# Patient Record
Sex: Female | Born: 1935
Health system: Southern US, Community
[De-identification: ages and names within clinical notes are randomized; demographics above are authoritative.]

## PROBLEM LIST (undated history)

## (undated) DIAGNOSIS — Z9889 Other specified postprocedural states: Secondary | ICD-10-CM

## (undated) DIAGNOSIS — K219 Gastro-esophageal reflux disease without esophagitis: Secondary | ICD-10-CM

## (undated) DIAGNOSIS — M48061 Spinal stenosis, lumbar region without neurogenic claudication: Secondary | ICD-10-CM

## (undated) DIAGNOSIS — I723 Aneurysm of iliac artery: Secondary | ICD-10-CM

## (undated) DIAGNOSIS — I10 Essential (primary) hypertension: Secondary | ICD-10-CM

## (undated) DIAGNOSIS — M353 Polymyalgia rheumatica: Secondary | ICD-10-CM

## (undated) DIAGNOSIS — E785 Hyperlipidemia, unspecified: Secondary | ICD-10-CM

## (undated) HISTORY — DX: Aneurysm of iliac artery: I72.3

## (undated) HISTORY — DX: Essential (primary) hypertension: I10

## (undated) HISTORY — DX: Hyperlipidemia, unspecified: E78.5

## (undated) HISTORY — PX: SPINE SURGERY: SHX786

## (undated) HISTORY — DX: Polymyalgia rheumatica: M35.3

## (undated) HISTORY — DX: Spinal stenosis, lumbar region without neurogenic claudication: M48.061

## (undated) HISTORY — DX: Gastro-esophageal reflux disease without esophagitis: K21.9

---

## 1990-04-14 HISTORY — PX: JOINT REPLACEMENT: SHX530

## 1997-08-01 ENCOUNTER — Ambulatory Visit (HOSPITAL_COMMUNITY): Admission: RE | Admit: 1997-08-01 | Discharge: 1997-08-01 | Payer: Self-pay | Admitting: Internal Medicine

## 1997-08-21 ENCOUNTER — Inpatient Hospital Stay (HOSPITAL_COMMUNITY): Admission: RE | Admit: 1997-08-21 | Discharge: 1997-08-25 | Payer: Self-pay | Admitting: Orthopedic Surgery

## 1997-08-28 ENCOUNTER — Other Ambulatory Visit: Admission: RE | Admit: 1997-08-28 | Discharge: 1997-08-28 | Payer: Self-pay | Admitting: Orthopedic Surgery

## 1997-09-01 ENCOUNTER — Other Ambulatory Visit: Admission: RE | Admit: 1997-09-01 | Discharge: 1997-09-01 | Payer: Self-pay | Admitting: Orthopedic Surgery

## 1997-09-08 ENCOUNTER — Other Ambulatory Visit: Admission: RE | Admit: 1997-09-08 | Discharge: 1997-09-08 | Payer: Self-pay | Admitting: Orthopedic Surgery

## 1998-07-12 ENCOUNTER — Other Ambulatory Visit: Admission: RE | Admit: 1998-07-12 | Discharge: 1998-07-12 | Payer: Self-pay | Admitting: Internal Medicine

## 1999-01-30 ENCOUNTER — Encounter: Admission: RE | Admit: 1999-01-30 | Discharge: 1999-01-30 | Payer: Self-pay | Admitting: Orthopedic Surgery

## 1999-01-30 ENCOUNTER — Encounter: Payer: Self-pay | Admitting: Orthopedic Surgery

## 2000-04-14 HISTORY — PX: ROTATOR CUFF REPAIR: SHX139

## 2000-04-14 HISTORY — PX: JOINT REPLACEMENT: SHX530

## 2000-11-11 ENCOUNTER — Encounter: Payer: Self-pay | Admitting: Internal Medicine

## 2000-11-11 ENCOUNTER — Ambulatory Visit (HOSPITAL_COMMUNITY): Admission: RE | Admit: 2000-11-11 | Discharge: 2000-11-11 | Payer: Self-pay | Admitting: Internal Medicine

## 2000-11-25 ENCOUNTER — Other Ambulatory Visit: Admission: RE | Admit: 2000-11-25 | Discharge: 2000-11-25 | Payer: Self-pay | Admitting: Internal Medicine

## 2001-11-18 ENCOUNTER — Ambulatory Visit (HOSPITAL_COMMUNITY): Admission: RE | Admit: 2001-11-18 | Discharge: 2001-11-18 | Payer: Self-pay | Admitting: Internal Medicine

## 2001-11-18 ENCOUNTER — Encounter: Payer: Self-pay | Admitting: Internal Medicine

## 2002-12-15 ENCOUNTER — Encounter (INDEPENDENT_AMBULATORY_CARE_PROVIDER_SITE_OTHER): Payer: Self-pay | Admitting: Gastroenterology

## 2002-12-15 DIAGNOSIS — K5730 Diverticulosis of large intestine without perforation or abscess without bleeding: Secondary | ICD-10-CM

## 2003-02-09 ENCOUNTER — Ambulatory Visit (HOSPITAL_COMMUNITY): Admission: RE | Admit: 2003-02-09 | Discharge: 2003-02-09 | Payer: Self-pay | Admitting: Internal Medicine

## 2003-09-09 ENCOUNTER — Inpatient Hospital Stay (HOSPITAL_COMMUNITY): Admission: EM | Admit: 2003-09-09 | Discharge: 2003-09-10 | Payer: Self-pay | Admitting: Emergency Medicine

## 2004-02-20 ENCOUNTER — Ambulatory Visit (HOSPITAL_COMMUNITY): Admission: RE | Admit: 2004-02-20 | Discharge: 2004-02-20 | Payer: Self-pay | Admitting: Internal Medicine

## 2004-11-18 ENCOUNTER — Ambulatory Visit: Payer: Self-pay | Admitting: Internal Medicine

## 2004-11-20 ENCOUNTER — Ambulatory Visit: Payer: Self-pay | Admitting: Internal Medicine

## 2004-12-27 ENCOUNTER — Ambulatory Visit: Payer: Self-pay | Admitting: Family Medicine

## 2005-09-10 ENCOUNTER — Ambulatory Visit: Payer: Self-pay | Admitting: Internal Medicine

## 2005-12-01 ENCOUNTER — Ambulatory Visit: Payer: Self-pay | Admitting: Internal Medicine

## 2005-12-03 ENCOUNTER — Ambulatory Visit: Payer: Self-pay | Admitting: Internal Medicine

## 2005-12-03 ENCOUNTER — Other Ambulatory Visit: Admission: RE | Admit: 2005-12-03 | Discharge: 2005-12-03 | Payer: Self-pay | Admitting: Internal Medicine

## 2005-12-03 ENCOUNTER — Encounter: Payer: Self-pay | Admitting: Internal Medicine

## 2005-12-03 LAB — CONVERTED CEMR LAB

## 2005-12-09 ENCOUNTER — Ambulatory Visit (HOSPITAL_COMMUNITY): Admission: RE | Admit: 2005-12-09 | Discharge: 2005-12-09 | Payer: Self-pay | Admitting: Orthopedic Surgery

## 2006-01-23 ENCOUNTER — Ambulatory Visit: Payer: Self-pay | Admitting: Internal Medicine

## 2006-12-18 ENCOUNTER — Ambulatory Visit: Payer: Self-pay | Admitting: Internal Medicine

## 2006-12-18 LAB — CONVERTED CEMR LAB
BUN: 19 mg/dL (ref 6–23)
CO2: 29 meq/L (ref 19–32)
Calcium: 9.9 mg/dL (ref 8.4–10.5)
Chloride: 101 meq/L (ref 96–112)
Creatinine, Ser: 0.9 mg/dL (ref 0.4–1.2)
GFR calc Af Amer: 79 mL/min
GFR calc non Af Amer: 65 mL/min
Glucose, Bld: 124 mg/dL — ABNORMAL HIGH (ref 70–99)
Potassium: 3.3 meq/L — ABNORMAL LOW (ref 3.5–5.1)
Sodium: 138 meq/L (ref 135–145)

## 2007-01-13 ENCOUNTER — Ambulatory Visit: Payer: Self-pay | Admitting: Internal Medicine

## 2007-01-13 LAB — CONVERTED CEMR LAB
ALT: 37 units/L — ABNORMAL HIGH (ref 0–35)
AST: 28 units/L (ref 0–37)
BUN: 24 mg/dL — ABNORMAL HIGH (ref 6–23)
CO2: 29 meq/L (ref 19–32)
Calcium: 10.2 mg/dL (ref 8.4–10.5)
Chloride: 107 meq/L (ref 96–112)
Cholesterol: 203 mg/dL (ref 0–200)
Creatinine, Ser: 0.9 mg/dL (ref 0.4–1.2)
Direct LDL: 130.8 mg/dL
GFR calc Af Amer: 79 mL/min
GFR calc non Af Amer: 65 mL/min
Glucose, Bld: 108 mg/dL — ABNORMAL HIGH (ref 70–99)
HDL: 60.2 mg/dL (ref >39.0)
Potassium: 3.9 meq/L (ref 3.5–5.1)
Sodium: 143 meq/L (ref 135–145)
Total CHOL/HDL Ratio: 3.4
Triglycerides: 139 mg/dL (ref 0–149)
VLDL: 28 mg/dL (ref 0–40)

## 2007-01-14 ENCOUNTER — Encounter: Payer: Self-pay | Admitting: Internal Medicine

## 2007-01-14 ENCOUNTER — Ambulatory Visit: Payer: Self-pay | Admitting: Internal Medicine

## 2007-01-14 DIAGNOSIS — E785 Hyperlipidemia, unspecified: Secondary | ICD-10-CM | POA: Insufficient documentation

## 2007-01-14 DIAGNOSIS — I10 Essential (primary) hypertension: Secondary | ICD-10-CM | POA: Insufficient documentation

## 2007-01-14 DIAGNOSIS — R1310 Dysphagia, unspecified: Secondary | ICD-10-CM | POA: Insufficient documentation

## 2007-01-20 ENCOUNTER — Ambulatory Visit: Payer: Self-pay | Admitting: Internal Medicine

## 2007-01-21 ENCOUNTER — Ambulatory Visit: Payer: Self-pay | Admitting: Internal Medicine

## 2007-01-26 ENCOUNTER — Ambulatory Visit (HOSPITAL_COMMUNITY): Admission: RE | Admit: 2007-01-26 | Discharge: 2007-01-26 | Payer: Self-pay | Admitting: Internal Medicine

## 2007-02-04 ENCOUNTER — Ambulatory Visit: Payer: Self-pay | Admitting: Internal Medicine

## 2007-09-19 ENCOUNTER — Ambulatory Visit (HOSPITAL_COMMUNITY): Admission: EM | Admit: 2007-09-19 | Discharge: 2007-09-19 | Payer: Self-pay | Admitting: Emergency Medicine

## 2007-10-11 ENCOUNTER — Encounter: Payer: Self-pay | Admitting: Internal Medicine

## 2007-12-21 ENCOUNTER — Ambulatory Visit: Payer: Self-pay | Admitting: Internal Medicine

## 2007-12-21 LAB — CONVERTED CEMR LAB
ALT: 24 units/L (ref 0–35)
AST: 23 units/L (ref 0–37)
Albumin: 4 g/dL (ref 3.5–5.2)
Alkaline Phosphatase: 44 units/L (ref 39–117)
BUN: 23 mg/dL (ref 6–23)
Basophils Absolute: 0 10*3/uL (ref 0.0–0.1)
Basophils Relative: 0.6 % (ref 0.0–3.0)
Bilirubin, Direct: 0.1 mg/dL (ref 0.0–0.3)
CO2: 29 meq/L (ref 19–32)
Calcium: 10.5 mg/dL (ref 8.4–10.5)
Chloride: 105 meq/L (ref 96–112)
Cholesterol: 193 mg/dL (ref 0–200)
Creatinine, Ser: 1.1 mg/dL (ref 0.4–1.2)
Eosinophils Absolute: 0.2 10*3/uL (ref 0.0–0.7)
Eosinophils Relative: 2.7 % (ref 0.0–5.0)
GFR calc Af Amer: 63 mL/min
GFR calc non Af Amer: 52 mL/min
Glucose, Bld: 104 mg/dL — ABNORMAL HIGH (ref 70–99)
HCT: 45.1 % (ref 36.0–46.0)
HDL: 43.2 mg/dL (ref >39.0)
Hemoglobin: 15.3 g/dL — ABNORMAL HIGH (ref 12.0–15.0)
LDL Cholesterol: 129 mg/dL — ABNORMAL HIGH (ref 0–99)
Lymphocytes Relative: 30.7 % (ref 12.0–46.0)
MCHC: 33.9 g/dL (ref 30.0–36.0)
MCV: 95.5 fL (ref 78.0–100.0)
Monocytes Absolute: 0.8 10*3/uL (ref 0.1–1.0)
Monocytes Relative: 14.3 % — ABNORMAL HIGH (ref 3.0–12.0)
Neutro Abs: 3 10*3/uL (ref 1.4–7.7)
Neutrophils Relative %: 51.7 % (ref 43.0–77.0)
Platelets: 180 10*3/uL (ref 150–400)
Potassium: 4.3 meq/L (ref 3.5–5.1)
RBC: 4.72 M/uL (ref 3.87–5.11)
RDW: 12.9 % (ref 11.5–14.6)
Sodium: 144 meq/L (ref 135–145)
TSH: 0.75 microintl units/mL (ref 0.35–5.50)
Total Bilirubin: 1 mg/dL (ref 0.3–1.2)
Total CHOL/HDL Ratio: 4.5
Total Protein: 7.1 g/dL (ref 6.0–8.3)
Triglycerides: 103 mg/dL (ref 0–149)
VLDL: 21 mg/dL (ref 0–40)
WBC: 5.7 10*3/uL (ref 4.5–10.5)

## 2007-12-22 ENCOUNTER — Telehealth: Payer: Self-pay | Admitting: Internal Medicine

## 2007-12-27 ENCOUNTER — Ambulatory Visit (HOSPITAL_COMMUNITY): Admission: RE | Admit: 2007-12-27 | Discharge: 2007-12-27 | Payer: Self-pay | Admitting: Internal Medicine

## 2007-12-27 ENCOUNTER — Encounter: Payer: Self-pay | Admitting: Internal Medicine

## 2008-01-12 ENCOUNTER — Telehealth: Payer: Self-pay | Admitting: Internal Medicine

## 2008-01-14 ENCOUNTER — Ambulatory Visit: Payer: Self-pay | Admitting: Internal Medicine

## 2008-01-25 ENCOUNTER — Ambulatory Visit: Payer: Self-pay | Admitting: Internal Medicine

## 2008-01-28 ENCOUNTER — Ambulatory Visit: Payer: Self-pay | Admitting: Internal Medicine

## 2008-01-28 ENCOUNTER — Ambulatory Visit (HOSPITAL_COMMUNITY): Admission: RE | Admit: 2008-01-28 | Discharge: 2008-01-28 | Payer: Self-pay | Admitting: Internal Medicine

## 2008-08-15 ENCOUNTER — Encounter: Payer: Self-pay | Admitting: Internal Medicine

## 2008-09-05 ENCOUNTER — Encounter: Admission: RE | Admit: 2008-09-05 | Discharge: 2008-09-05 | Payer: Self-pay | Admitting: Neurological Surgery

## 2008-09-13 ENCOUNTER — Encounter: Payer: Self-pay | Admitting: Internal Medicine

## 2008-09-25 ENCOUNTER — Inpatient Hospital Stay (HOSPITAL_COMMUNITY): Admission: RE | Admit: 2008-09-25 | Discharge: 2008-09-29 | Payer: Self-pay | Admitting: Neurological Surgery

## 2009-01-01 ENCOUNTER — Ambulatory Visit: Payer: Self-pay | Admitting: Internal Medicine

## 2009-01-02 DIAGNOSIS — M51379 Other intervertebral disc degeneration, lumbosacral region without mention of lumbar back pain or lower extremity pain: Secondary | ICD-10-CM | POA: Insufficient documentation

## 2009-01-02 DIAGNOSIS — E669 Obesity, unspecified: Secondary | ICD-10-CM | POA: Insufficient documentation

## 2009-01-02 DIAGNOSIS — M5137 Other intervertebral disc degeneration, lumbosacral region: Secondary | ICD-10-CM

## 2009-01-09 ENCOUNTER — Ambulatory Visit: Payer: Self-pay | Admitting: Internal Medicine

## 2009-01-19 ENCOUNTER — Encounter: Payer: Self-pay | Admitting: Internal Medicine

## 2009-02-02 ENCOUNTER — Telehealth: Payer: Self-pay | Admitting: Internal Medicine

## 2009-02-06 ENCOUNTER — Telehealth: Payer: Self-pay | Admitting: Internal Medicine

## 2009-08-01 ENCOUNTER — Encounter: Payer: Self-pay | Admitting: Internal Medicine

## 2009-11-27 ENCOUNTER — Ambulatory Visit (HOSPITAL_COMMUNITY): Admission: EM | Admit: 2009-11-27 | Discharge: 2009-11-27 | Payer: Self-pay | Admitting: Emergency Medicine

## 2009-12-19 ENCOUNTER — Ambulatory Visit: Payer: Self-pay | Admitting: Internal Medicine

## 2010-01-08 ENCOUNTER — Encounter: Payer: Self-pay | Admitting: Internal Medicine

## 2010-01-10 ENCOUNTER — Encounter: Payer: Self-pay | Admitting: Internal Medicine

## 2010-01-10 ENCOUNTER — Ambulatory Visit: Payer: Self-pay | Admitting: Internal Medicine

## 2010-01-10 LAB — CONVERTED CEMR LAB
AST: 27 units/L (ref 0–37)
Albumin: 4.2 g/dL (ref 3.5–5.2)
BUN: 18 mg/dL (ref 6–23)
Basophils Absolute: 0 10*3/uL (ref 0.0–0.1)
CO2: 31 meq/L (ref 19–32)
Chloride: 102 meq/L (ref 96–112)
Cholesterol: 208 mg/dL — ABNORMAL HIGH (ref 0–200)
Creatinine, Ser: 1 mg/dL (ref 0.4–1.2)
Direct LDL: 131.9 mg/dL
Eosinophils Absolute: 0.1 10*3/uL (ref 0.0–0.7)
GFR calc non Af Amer: 60.88 mL/min (ref >60)
Glucose, Bld: 98 mg/dL (ref 70–99)
Lymphocytes Relative: 23.8 % (ref 12.0–46.0)
MCHC: 34.3 g/dL (ref 30.0–36.0)
MCV: 94.2 fL (ref 78.0–100.0)
Monocytes Relative: 11.7 % (ref 3.0–12.0)
Neutro Abs: 3.8 10*3/uL (ref 1.4–7.7)
Neutrophils Relative %: 62 % (ref 43.0–77.0)
Platelets: 193 10*3/uL (ref 150.0–400.0)
RDW: 13.9 % (ref 11.5–14.6)
Sodium: 141 meq/L (ref 135–145)
Total Bilirubin: 0.9 mg/dL (ref 0.3–1.2)
Total CHOL/HDL Ratio: 3
Triglycerides: 133 mg/dL (ref 0.0–149.0)
VLDL: 26.6 mg/dL (ref 0.0–40.0)

## 2010-02-01 ENCOUNTER — Encounter: Payer: Self-pay | Admitting: Internal Medicine

## 2010-05-16 NOTE — Letter (Signed)
Summary: [facility]  [facility]   Imported By: Sherian Rein 02/19/2010 09:34:49  _____________________________________________________________________  External Attachment:    Type:   Image     Comment:   External Document

## 2010-05-16 NOTE — Assessment & Plan Note (Signed)
Summary: FLU SHOT PER DEE   Nurse Visit   Allergies: No Known Drug Allergies  Immunizations Administered:  Pneumonia Vaccine:    Vaccine Type: Pneumovax    Site: right deltoid    Mfr: Merck    Dose: 0.5 ml    Route: IM    Given by: Mervin Hack CMA (AAMA)    Exp. Date: 06/19/2011    Lot #: [id number]    VIS given: 11/10/95 version given December 19, 2009.  Influenza Vaccine # 1:    Vaccine Type: Fluvax 3+    Site: left deltoid    Mfr: GlaxoSmithKline    Dose: 0.5 ml    Route: IM    Given by: Mervin Hack CMA (AAMA)    Exp. Date: 10/12/2010    Lot #: [id number]    VIS given: 11/06/2009  Flu Vaccine Consent Questions:    Do you have a history of severe allergic reactions to this vaccine? no    Any prior history of allergic reactions to egg and/or gelatin? no    Do you have a sensitivity to the preservative Thimersol? no    Do you have a past history of Guillan-Barre Syndrome? no    Do you currently have an acute febrile illness? no    Have you ever had a severe reaction to latex? no    Vaccine information given and explained to patient? yes    Are you currently pregnant? no  Orders Added: 1)  Pneumococcal Vaccine [90732] 2)  Admin 1st Vaccine [90471] 3)  Flu Vaccine 89yrs + [14782] 4)  Admin of Any Addtl Vaccine [95621]

## 2010-05-16 NOTE — Letter (Signed)
Summary: [facility]  [facility]   Imported By: Sherian Rein 01/25/2010 12:25:50  _____________________________________________________________________  External Attachment:    Type:   Image     Comment:   External Document

## 2010-05-16 NOTE — Assessment & Plan Note (Signed)
Summary: yearly f/u / medicare / will come fasting / pt wants flu shot...   Vital Signs:  Patient profile:   77 year old female Height:      67 inches Weight:      211 pounds BMI:     33.17 O2 Sat:      97 % on Room air Temp:     98.2 degrees F oral Pulse rate:   55 / minute BP sitting:   136 / 82  (left arm) Cuff size:   regular  Vitals Entered By: Bill Salinas CMA (January 10, 2010 8:54 AM)  O2 Flow:  Room air CC: yearly medicare/ ab Comments Pt on celebrex unsure of dose will call to update  Vision Screening:      Vision Comments: Normal eye exam done June 2010 at [facility]   Primary Care Provider:  Norins  CC:  yearly medicare/ ab.  History of Present Illness: Patient presents for preventive care and medical follow-uip.  She had lumbar fusion June '10. Since then her back has been ok but she has low back pain and pain running down her leg. She has seen Dr. Danielle Dess who recommend possible ESI low back. For now she is on a trial of celebrex. Her back pain does limit her walking. She has had PT after surgery and in spring '11.   she has had 2 falls during the past year. !st - no injury. 2nd - she dislocated left hip and fell. Had hip (THR) reduced without complications.  She did hit her shoulder and had pain.   She did have a good trip to [place] for HS reunion. She sees her grandchildren and great-grandchildren. She has no signs or symptoms of depression.   Despite her back pain she is independent in all ADLs. She runs the household, pays the bills, keeps up with all the birthdays.   Preventive Screening-Counseling & Management  Alcohol-Tobacco     Alcohol drinks/day: 0     Smoking Status: never  Caffeine-Diet-Exercise     Caffeine use/day: 1 cup daily     Does Patient Exercise: yes     Type of exercise: cardio     Exercise (avg: min/session): <30     Times/week: 6     MSH Depression Score: no depression  Hep-HIV-STD-Contraception     HIV Risk: no  risk noted     STD Risk: no risk noted     Dental Visit-last 6 months yes     Sun Exposure-Excessive: no  Safety-Violence-Falls     Seat Belt Use: yes     Helmet Use: n/a     Firearms in the Home: no firearms in the home     Smoke Detectors: yes     Violence in the Home: no risk noted     Sexual Abuse: no     Fall Risk: Pt has fallen 2 times in the past year      Sexual History:  currently monogamous.        Drug Use:  never.        Blood Transfusions:  yes and after 2001.    Current Medications (verified): 1)  Lipitor 40 Mg  Tabs (Atorvastatin Calcium) .... Q Pm 2)  Nadolol 40 Mg  Tabs (Nadolol) .... Once Daily 3)  Triamterene-Hctz 75-50 Mg  Tabs (Triamterene-Hctz) .... Once Daily 4)  Fish Oil 1000 Mg Caps (Omega-3 Fatty Acids) .Marland Kitchen.. 1 Cap Daily 5)  Caltrate 600+d 600-400 Mg-Unit Tabs (Calcium  Carbonate-Vitamin D) .Marland Kitchen.. 1 Daily  Allergies (verified): No Known Drug Allergies  Past History:  Family History: Last updated: 2008/01/14 father- deceased @ 63: CAD/MI, HTN, Bone marrow cancer mother - deceased @ 14: CHF, CAD Neg- breast or colon cancer Sister died DM complications @ 67  Social History: Last updated: 01/14/08 HSG, [employer] college - biology/psych, 6hrs shy of MA married - '52 1 daughter - '53, 1 son - '57: 3 grandsons; 3 great-grandchildren retired: Runner, broadcasting/film/video marriage is good. SO- healthy  Past Medical History: UCD Hyperlipidemia Hypertension  Hx of COLONIC POLYPS (ICD-211.3) DIVERTICULOSIS, COLON (ICD-562.10) DYSPHAGIA, UNSPECIFIED (ZOX-096.04)   Physician Roster:              NS - Dr. Georga Hacking - Dr. Jerolyn Center  Past Surgical History: Total hip replacement: right '92; left '86 Lumbar fusion-'90s, redo surgery in June '10 with extension Rotator cuff repair-'02 blephoroplasty Tendon repair left wrist after laceration    G2P2  Social History: Caffeine use/day:  1 cup daily Dental Care w/in 6 mos.:  yes Sun  Exposure-Excessive:  no Seat Belt Use:  yes Fall Risk:  Pt has fallen 2 times in the past year HIV Risk:  no risk noted STD Risk:  no risk noted Sexual History:  currently monogamous Drug Use:  never Blood Transfusions:  yes, after 2001  Review of Systems       The patient complains of weight loss and difficulty walking.  The patient denies anorexia, fever, weight gain, decreased hearing, hoarseness, chest pain, syncope, dyspnea on exertion, peripheral edema, abdominal pain, severe indigestion/heartburn, muscle weakness, abnormal bleeding, enlarged lymph nodes, and breast masses.    Physical Exam  General:  Overweight white female in no distress Head:  normocephalic, atraumatic, and no abnormalities observed.   Eyes:  vision grossly intact, pupils equal, pupils round, corneas and lenses clear, no optic disk abnormalities, and no retinal abnormalitiies.   Ears:  R ear normal, L ear normal, and no external deformities.   Nose:  no external deformity and no external erythema.   Mouth:  Oral mucosa and oropharynx without lesions or exudates.  Teeth in good repair. Neck:  supple, full ROM, no thyromegaly, and no carotid bruits.   Chest Wall:  no deformities and no tenderness.   Breasts:  No mass, nodules, thickening, tenderness, bulging, retraction, inflamation, nipple discharge or skin changes noted.   Lungs:  Normal respiratory effort, chest expands symmetrically. Lungs are clear to auscultation, no crackles or wheezes. Heart:  Normal rate and regular rhythm. S1 and S2 normal without gallop, murmur, click, rub or other extra sounds. Abdomen:  obese, soft, non-tender, normal bowel sounds, no guarding, and no hepatomegaly.   Genitalia:  deferred Msk:  normal ROM, no joint tenderness, no joint swelling, no joint warmth, and no joint deformities.    Back: able to stand without assistance, able to get up to exam table. Uses cane to assist with gait.   Hips: right hip with normal internal and  external rotation. left hip with decreased ROM and some tenderness with rotation. Pulses:  2+ radial and DP pulse Extremities:  No clubbing, cyanosis, edema, or deformity noted with normal full range of motion of all joints.   Neurologic:  alert & oriented X3, cranial nerves II-XII intact, and strength normal in all extremities.   Skin:  turgor normal, color normal, no rashes, and no suspicious lesions.   Cervical Nodes:  no anterior  cervical adenopathy and no posterior cervical adenopathy.   Axillary Nodes:  no R axillary adenopathy and no L axillary adenopathy.   Psych:  Oriented X3, memory intact for recent and remote, normally interactive, and good eye contact.     Impression & Recommendations:  Problem # 1:  DEGENERATIVE DISC DISEASE, LUMBAR SPINE (ICD-722.52) S/P fusion x 2 and still with low back pain but no radicular symptoms. She is followed closely by Dr. Danielle Dess  Orders: TLB-CBC Platelet - w/Differential (85025-CBCD)  Problem # 2:  DIVERTICULOSIS, COLON (ICD-562.10) No GI complaints or problems   Problem # 3:  HYPERTENSION (ICD-401.9)  Her updated medication list for this problem includes:    Nadolol 40 Mg Tabs (Nadolol) ..... Once daily    Triamterene-hctz 75-50 Mg Tabs (Triamterene-hctz) ..... Once daily  Orders: TLB-Lipid Panel (80061-LIPID) TLB-Hepatic/Liver Function Pnl (80076-HEPATIC) TLB-BMP (Basic Metabolic Panel-BMET) (80048-METABOL)  BP today: 136/82 Prior BP: 122/80 (01/01/2009)   Good control. routnine lab ordered. Medication refilled  Problem # 4:  HYPERLIPIDEMIA (ICD-272.4) For lab today with recommendations to follow.  Her updated medication list for this problem includes:    Lipitor 40 Mg Tabs (Atorvastatin calcium) ..... Q pm  Orders: TLB-Lipid Panel (80061-LIPID) TLB-Hepatic/Liver Function Pnl (80076-HEPATIC)  Addendum: LDL just greater than goal of 130.   Plan - continue present medications.           increase adherence to a low fat diet  and a regular exercise regimen -  a low stress activity e.g. water exercise.  Problem # 5:  Preventive Health Care (ICD-V70.0)  History as noted - on going back pain and now some hip pain. Exam was normal except for ortho problems. Lab.  Last mammogram in '09 and she is planning to have mammogram prior to leaving for [place]. Current with colorectal cancer screening. Immunizations - current with pneumonia vaccine . Last tetnus in '99 and due. she will consider shingles vaccine.  Patient with no signs of depression, I-ADLs, current with opthalmology and no visual problems. Cognitively intact -see HPI. Patinet counselled to work on weight loss, to continue present meds.  In summary - a very nice woman who is medically stable but dealing with NS/ortho issues. She will return as needed or 1 year.   Orders: Medicare -1st Annual Wellness Visit [phone])  Complete Medication List: 1)  Lipitor 40 Mg Tabs (Atorvastatin calcium) .... Q pm 2)  Nadolol 40 Mg Tabs (Nadolol) .... Once daily 3)  Triamterene-hctz 75-50 Mg Tabs (Triamterene-hctz) .... Once daily 4)  Fish Oil 1000 Mg Caps (Omega-3 fatty acids) .Marland Kitchen.. 1 cap daily 5)  Caltrate 600+d 600-400 Mg-unit Tabs (Calcium carbonate-vitamin d) .Marland Kitchen.. 1 daily  Patient: Sydney [name] Note: All result statuses are Final unless otherwise noted.  Tests: (1) Lipid Panel (LIPID)   Cholesterol          [H]  208 mg/dL                   5-784     ATP III Classification            Desirable:  < 200 mg/dL                    Borderline High:  200 - 239 mg/dL               High:  > = 240 mg/dL   Triglycerides             133.0 mg/dL  0.0-149.0     Normal:  <150 mg/dL     Borderline High:  865 - 199 mg/dL   HDL                       78.46 mg/dL                 >96.29   VLDL Cholesterol          26.6 mg/dL                  5.2-84.1  CHO/HDL Ratio:  CHD Risk                             3                    Men          Women     1/2 Average Risk     3.4           3.3     Average Risk          5.0          4.4     2X Average Risk          9.6          7.1     3X Average Risk          15.0          11.0                           Tests: (2) Hepatic/Liver Function Panel (HEPATIC)   Total Bilirubin           0.9 mg/dL                   3.2-4.4   Direct Bilirubin          0.1 mg/dL                   0.1-0.2   Alkaline Phosphatase      57 U/L                      39-117   AST                       27 U/L                      0-37   ALT                       28 U/L                      0-35   Total Protein             7.3 g/dL                    7.2-5.3   Albumin                   4.2 g/dL                    6.6-4.4  Tests: (3) BMP (METABOL)   Sodium  141 mEq/L                   135-145   Potassium                 4.3 mEq/L                   3.5-5.1   Chloride                  102 mEq/L                   96-112   Carbon Dioxide            31 mEq/L                    19-32   Glucose                   98 mg/dL                    16-10   BUN                       18 mg/dL                    9-60   Creatinine                1.0 mg/dL                   4.5-4.0   Calcium              [H]  11.1 mg/dL                  9.8-11.9   GFR                       60.88 mL/min                >60  Tests: (4) CBC Platelet w/Diff (CBCD)   White Cell Count          6.2 K/uL                    4.5-10.5   Red Cell Count            4.65 Mil/uL                 3.87-5.11   Hemoglobin                15.0 g/dL                   14.7-82.9   Hematocrit                43.8 %                      36.0-46.0   MCV                       94.2 fl                     78.0-100.0   MCHC                      34.3 g/dL  30.0-36.0   RDW                       13.9 %                      11.5-14.6   Platelet Count            193.0 K/uL                  150.0-400.0   Neutrophil %              62.0 %                      43.0-77.0   Lymphocyte %               23.8 %                      12.0-46.0   Monocyte %                11.7 %                      3.0-12.0   Eosinophils%              1.8 %                       0.0-5.0   Basophils %               0.7 %                       0.0-3.0   Neutrophill Absolute      3.8 K/uL                    1.4-7.7   Lymphocyte Absolute       1.5 K/uL                    0.7-4.0   Monocyte Absolute         0.7 K/uL                    0.1-1.0  Eosinophils, Absolute                             0.1 K/uL                    0.0-0.7   Basophils Absolute        0.0 K/uL                    0.0-0.1  Tests: (5) Cholesterol LDL - Direct (DIRLDL)  Cholesterol LDL - Direct                             131.9 mg/dL     Optimal:  <161 mg/dL     Near or Above Optimal:  100-129 mg/dL     Borderline High:  096-045 mg/dL     High:  409-811 mg/dL     Very High:  >914 mg/dLPrescriptions: TRIAMTERENE-HCTZ 75-50 MG  TABS (TRIAMTERENE-HCTZ) once daily  #90 Tablet x 2   Entered and Authorized by:   Jacques Navy MD   Signed by:   Jacques Navy  MD on 01/10/2010   Method used:   Faxed to ...       [facility] (mail-order)             , [place]         Ph: [phone]       Fax: [phone]   RxID:   [id number] NADOLOL 40 MG  TABS (NADOLOL) once daily  #90 Tablet x 2   Entered and Authorized by:   Jacques Navy MD   Signed by:   Jacques Navy MD on 01/10/2010   Method used:   Faxed to ...       [facility] (mail-order)             , [place]         Ph: [phone]       Fax: [phone]   RxID:   [id number] LIPITOR 40 MG  TABS (ATORVASTATIN CALCIUM) q PM  #90 x 3   Entered and Authorized by:   Jacques Navy MD   Signed by:   Jacques Navy MD on 01/10/2010   Method used:   Faxed to ...       [facility] (mail-order)             , [place]         Ph: [phone]       Fax: [phone]   RxID:   [id number]

## 2010-05-16 NOTE — Letter (Signed)
Summary: [facility]  [facility]   Imported By: Sherian Rein 08/09/2009 09:13:52  _____________________________________________________________________  External Attachment:    Type:   Image     Comment:   External Document

## 2010-07-17 ENCOUNTER — Telehealth: Payer: Self-pay | Admitting: *Deleted

## 2010-07-17 NOTE — Telephone Encounter (Signed)
Alternative medication - furosemide 40mg  once a day # 30, refill 11

## 2010-07-17 NOTE — Telephone Encounter (Signed)
[facility] called - pt's triam/hctz 75/50 is temp not avail. Please provide alternative.

## 2010-07-18 MED ORDER — FUROSEMIDE 40 MG PO TABS: 40.0000 mg | ORAL_TABLET | Freq: Every day | ORAL | Status: DC

## 2010-07-18 NOTE — Telephone Encounter (Signed)
Called new rx into [facility], pt aware of change.

## 2010-07-18 NOTE — Telephone Encounter (Signed)
Attempted to call - no answer, will send in new rx electronically

## 2010-07-22 LAB — CBC
HCT: 34.7 % — ABNORMAL LOW (ref 36.0–46.0)
HCT: 45 % (ref 36.0–46.0)
Hemoglobin: 11.7 g/dL — ABNORMAL LOW (ref 12.0–15.0)
MCHC: 33.7 g/dL (ref 30.0–36.0)
MCV: 93.5 fL (ref 78.0–100.0)
Platelets: 185 10*3/uL (ref 150–400)
RDW: 14.1 % (ref 11.5–15.5)
WBC: 5.4 10*3/uL (ref 4.0–10.5)

## 2010-07-22 LAB — BASIC METABOLIC PANEL
CO2: 28 mEq/L (ref 19–32)
CO2: 28 mEq/L (ref 19–32)
Chloride: 109 mEq/L (ref 96–112)
GFR calc Af Amer: >60 mL/min (ref >60)
Glucose, Bld: 110 mg/dL — ABNORMAL HIGH (ref 70–99)
Glucose, Bld: 138 mg/dL — ABNORMAL HIGH (ref 70–99)
Potassium: 3.9 mEq/L (ref 3.5–5.1)
Potassium: 4.6 mEq/L (ref 3.5–5.1)
Sodium: 138 mEq/L (ref 135–145)
Sodium: 145 mEq/L (ref 135–145)

## 2010-07-22 LAB — POCT I-STAT 4, (NA,K, GLUC, HGB,HCT): Glucose, Bld: 156 mg/dL — ABNORMAL HIGH (ref 70–99)

## 2010-07-22 LAB — ABO/RH: ABO/RH(D): A NEG

## 2010-07-22 LAB — TYPE AND SCREEN
ABO/RH(D): A NEG
Antibody Screen: NEGATIVE

## 2010-08-09 ENCOUNTER — Telehealth: Payer: Self-pay | Admitting: Internal Medicine

## 2010-08-09 NOTE — Telephone Encounter (Signed)
Pt had CPX Sep 2011.  Pt needs refill: Nadolol,furosemide. Pt will be out soon, but needs refills through next Sept. [facility]----[phone]

## 2010-08-13 MED ORDER — FUROSEMIDE 40 MG PO TABS: 40.0000 mg | ORAL_TABLET | Freq: Every day | ORAL | Status: DC

## 2010-08-13 MED ORDER — NADOLOL 40 MG PO TABS: 40.0000 mg | ORAL_TABLET | Freq: Every day | ORAL | Status: DC

## 2010-08-27 NOTE — Op Note (Signed)
NAME:  Sydney [name], Sydney [name] NO.:  [id number]   MEDICAL RECORD NO.:  [id number]          PATIENT TYPE:  EMS   LOCATION:  ED                           FACILITY:  [facility]   PHYSICIAN:  Ollen Gross, M.D.    DATE OF BIRTH:  1932/05/06   DATE OF PROCEDURE:  09/19/2007  DATE OF DISCHARGE:                               OPERATIVE REPORT   PREOPERATIVE DIAGNOSIS:  Dislocated left total hip arthroplasty.   POSTOPERATIVE DIAGNOSIS:  Dislocated left total hip arthroplasty.   PROCEDURE:  Closed reduction of left hip dislocation.   SURGEON:  Ollen Gross, M.D.  No assistant.   ANESTHESIA:  General.   COMPLICATIONS:  None.   CONDITION:  Stable to recovery.   BRIEF CLINICAL NOTE:  Ms. Hertenstein is a 77 year old female who had a total  hip arthroplasty done by Dr. Tresa Res approximately 20 years ago.  She  did well and had a dislocation some time of the past 2 years and then  today was bending down for something, reached over too far, sustained  another dislocation.  She presents now for closed reduction.   PROCEDURE IN DETAIL:  After successful administration of general  anesthetic, I applied traction to the leg in 90-90 position and felt  like she was popping in and out.  I then extended the leg, applied  traction, internally rotated, and the hip audibly reduced.  This was  consistent with reduction of an anterior dislocation.  The leg lengths  were then equalized.  I put her through a range of motion and could flex  90, rotate 30 in each direction and then abduct about 40, and it  remained reduced.  An AP of the hip showed that the hip is reduced.  There is some wear in the polyethylene so it is not fully concentric.  She is then placed into a knee immobilizer, awakened and transported to  recovery in stable condition.  Ollen Gross, M.D.  Electronically Signed     FA/MEDQ  D:  09/19/2007  T:  09/19/2007  Job:  [id number]

## 2010-08-27 NOTE — Discharge Summary (Signed)
NAME:  [name], [name] NO.:  [id number]   MEDICAL RECORD NO.:  [id number]          PATIENT TYPE:  INP   LOCATION:  3040                         FACILITY:  [facility]   PHYSICIAN:  Stefani Dama, M.D.  DATE OF BIRTH:  Oct 28, 1932   DATE OF ADMISSION:  09/25/2008  DATE OF DISCHARGE:  09/29/2008                               DISCHARGE SUMMARY   ADMITTING DIAGNOSIS:  Lumbar spondylosis and stenosis L1-2, L2-3, L3-4  status post arthrodesis L4 to sacrum.   DISCHARGE AND FINAL DIAGNOSES:  Spondylosis and stenosis L1-2, L2-3, and  L3-4 status post arthrodesis L4 to sacrum, lumbar radiculopathy.   CONDITION ON DISCHARGE:  Improving.   HOSPITAL COURSE:  Ms. Sydney [name] is a 77 year old individual who has  had severe spondylitic stenosis that has been worsening over a period of  several years.  She has evidence of stenotic disease with  lateral  recess stenosis at L1-2, more severe at L2-3 and the worse singular  level at L3-4 which is junctional to an L4 to sacrum arthrodesis.  She  was advised regarding surgical decompression with removal of her  previously placed hardware and extension of her arthrodesis to include  the L1 vertebra.  This was performed on September 25, 2008, with a  transforaminal interbody arthrodesis at the L1-L2 level and  posterolateral fusion from L1 to L4.  She tolerated her procedure well,  and gradually she was mobilized and she became independent in her level  of function within 48 hours.  Her pain became easily controllable with  oral pain medications after 72 hours.  She has not had a bowel movement  at the time of discharge, but she is voiding.  Incision is clean and  dry.  Surgical staples have been removed, and the patient is using  Percocet for pain control and Valium as a muscle spasm medication.  She  was given a prescription for 60 of Percocet and 40 of Valium 5 mg.  She  will be seen in the office in 2-3 weeks' time.  Condition on discharge  is improving.      Stefani Dama, M.D.  Electronically Signed     Stefani Dama, M.D.  Electronically Signed    HJE/MEDQ  D:  09/29/2008  T:  09/29/2008  Job:  [id number]

## 2010-08-27 NOTE — Assessment & Plan Note (Signed)
[facility]                         GASTROENTEROLOGY OFFICE NOTE   Sydney [name], Sydney [name]                        MRN:          [id number]  DATE:01/20/2007                            DOB:          Jan 05, 1933    REFERRING PHYSICIAN:  Rosalyn Gess. Norins, MD   REASON FOR CONSULTATION:  Dysphagia.   ASSESSMENT:  This is a 77 year old white woman with an 70-month history  of worsening and liquid food dysphagia as well as some spells of globus  or dysphagia, perhaps spasm while she is not eating.   PLAN:  1. Schedule upper GI endoscopy with possible dilation.  2. Consider biopsies for eosinophilic esophagitis, though this is      usually a disease of younger people.  3. Consider need for postprocedural proton pump inhibitor therapy.   This was all discussed with the patient including risks, benefits and  indications that include, but not limited to, bleeding, infection,  medication reaction, perforation and possible need for surgery.   HISTORY:  This is a 77 year old white woman with problems as above.  It  has come on and she has had several spells where it has been very  difficult to clear a food bolus and it seems to pass over time.  There  are times where she drinks water and sort of regurgitates it quite  quickly.  There are also times where she feels like phlegm builds up and  will not pass.  She has not lost weight.  There are some other water  brash symptoms, as noted.   PAST MEDICAL HISTORY:  1. Hyperplastic colon polyp, 2004, colonoscopy recall changed to 2014      after chart review.  2. Diverticulosis.  3. Dyslipidemia.  4. Obesity.  5. Hypertension.  6. Prior right hip replacement.  7. Prior left hip replacement.  8. Lumbar disk surgery.  9. Left shoulder rotator cuff repair.  10.Prior blepharoplasty.   ALLERGIES:  No known drug allergies.   MEDICATIONS:  Listed and reviewed in the chart.  She does use occasional  Pepcid AC.   SOCIAL HISTORY:  See my medical history form.   REVIEW OF SYSTEMS:  See medical history form.   PHYSICAL EXAMINATION:  A pleasant white woman.  Weight 227.6 pounds, BMI 36.1, height 5 feet 6-1/2 inches.  Blood  pressure 122/72, pulse 62.  EYES:  Anicteric.  ENT:  Normal mouth and oropharynx.  NECK:  Supple without thyroid mass.  CHEST:  Clear.  HEART:  S1, S2.  No murmurs or gallops.  ABDOMEN:  Obese, soft and nontender.  No organomegaly or mass.  LYMPHATICS:  No neck or supraclavicular nodes palpated.  EXTREMITIES:  Trace peripheral edema to no edema on the lower  extremities at the ankles.  SKIN:  Multiple nevi and sun damage.  No suspicious lesions in the areas  inspected.  PSYCHIATRIC:  She is alert and oriented x3.  NEUROLOGIC:  She is alert and grossly nonfocal.   I have reviewed the [place] chart including previous endoscopy reports,  previous pathology reports and Dr. Alvera Novel previous office notes.   NOTE:  Obesity is certainly a risk factor for reflux disease and we will  discuss some options for weight loss with her.  I certainly caloric  restriction would be in order.     [name], MD,FACG  Electronically Signed    CEG/MedQ  DD: 01/20/2007  DT: 01/20/2007  Job #: [id number]   cc:   Rosalyn Gess. Norins, MD

## 2010-08-27 NOTE — Op Note (Signed)
NAME:  Sydney [name], Sydney [name] NO.:  [id number]   MEDICAL RECORD NO.:  [id number]          PATIENT TYPE:  INP   LOCATION:  3310                         FACILITY:  [facility]   PHYSICIAN:  Stefani Dama, M.D.  DATE OF BIRTH:  06-19-1932   DATE OF PROCEDURE:  09/25/2008  DATE OF DISCHARGE:                               OPERATIVE REPORT   PREOPERATIVE DIAGNOSES:  1. Spondylosis and stenosis L1-L2, L2-L3 and L3-L4 status post      arthrodesis L4 to sacrum.  2. Lumbar radiculopathy.   POSTOPERATIVE DIAGNOSES:  1. Spondylosis and stenosis L1-L2, L2-L3 and L3-L4 status post      arthrodesis L4 to sacrum.  2. Lumbar radiculopathy.   OPERATION:  1. Removal of hardware L4 to sacrum.  2. Laminectomy of L1, L2, L3, and L4 with decompression of L1, L2, L3,      and L4 nerve roots bilaterally.  3. Transforaminal interbody arthrodesis L1-L2 on the left segmental      fixation, L1 through L4 with pedicle screws, posterolateral      arthrodesis from L1 thorough L4 with local autograft and allograft      including infuse.   SURGEON:  Stefani Dama, MD   FIRST ASSISTANT:  Hewitt Shorts, MD   ANESTHESIA:  General endotracheal.   INDICATIONS:  Sydney [name] is a 77 year old individual who has had  significant spondylitic stenosis at L1-2, L2-3, L3-4.  She has had a  previous arthrodesis from L4 to the sacrum.  She has been complaining of  back and bilateral leg pain and after careful consideration of her  options.  I advised surgical decompression arthrodesis from L1 through  L4 with removal of her previously placed hardware.   PROCEDURE:  The patient was brought to the operating room supine on the  stretcher after smooth induction of general endotracheal anesthesia.  The patient was turned prone.  The back was prepped with alcohol and  DuraPrep and draped in sterile fashion.  After opening the midline  incision, the area of the old hardware was explored, and here,  identified  the screw caps on top of 2 parallel rods.  They were each  loosened and loosening of the rods required dissection further out to  identify the pedicle entry sites for the screws from L4 to the sacrum.  The screw caps were denuded and ultimately removed, then were able to  remove the rod and screw caps.  The screws were then removed  individually with the old [employer] instrumentation that had been used.  Once the screws were removed from the left side, it was noted that the  sacral screw was actually fractured within the sacrum itself on the left  side.  The right-sided hardware was removed with some difficulty but  that hardware was noted to be intact.  The left-sided sacral screw was  then removed by drilling around the broken stud, and then using a stress  but extracting tool to remove the stud from within the bone of the  sacrum.  At this point, we, Dr. Newell Coral and I, decided  to complete the  completion laminectomy of L4, remove the entirety of the lamina of L3  and L2 and L1 had a radical laminectomy performing bilateral total  facetectomies to decompress the L1 nerve root superiorly and the L2  nerve root inferiorly.  As the laminectomy was proceeding, the right-  sided pedicle fracture releasing the transverse process on the left side  and the care was taken to protect the exiting L2 nerve root on the left  side, however because of the nature of the pedicle fracture with a small  very deformed pedicle, felt that no pedicle screw be placed into this  area.  Once the laminectomies were completed, we sequentially made sure  the decompression of each of the nerve root was ensued.  There was a  significant posterolateral bulging disk at L1 and L2 on that left side  that necessitated diskectomy and then the transforaminal decompression  was performed with the diskectomy being completely denuded, a TLIF bone  spacer was placed measuring 7 mm in height.  This was filled with  combination of  Vitoss and infuse.  The diskectomy performed via this  left side at the L1-L2 level was completed as best possible.  The bony  endplates were decorticated and then bone that was harvested from the  laminectomies was placed into the interspace and the TLIF spacer. Was  placed into the center of the vertebra.  Pedicle entry sites were then  chosen at L1 and at L1 6.5 x 45 mm screws were drilled and tapped.  These were placed with a significant angle towards each other so that  good purchase in the bone, throughout the vertebra was obtained.  Good  purchase through the pedicle was obtained in an angular fashion.  The  screw would hold quite well.  At L2, right-sided pedicle screw was  placed.  This was 6.5 x 45 mm each screw was individually tapped and  sounded, make sure there was no cutout in addition to being  radiographically verified.  At L3, bilateral 6.5 x 45 mm screws were  placed.  At L4, the right-sided screw hole was used for placement of the  right-sided screw but on the left side, we drilled a new hole so as to  place the screws in a more parallel orientation.  A 6.5 x 45 mm screw  was then drilled, tapped and sounded.  No cutout was identified.  With  this then, 75-mm rods were placed between the screw heads.  These rods  were precontoured.  Because of the lack of any fixation at the L2  vertebrae, we placed a transverse connector.  With this being secured at  the lateral gutters and it  had been previously decorticated by both,  Dr. Newell Coral and myself, were then packed with a combination of  allograft and autograft.  Once the bone was packed and tamped into  position, care was taken to make sure that each of the individual nerve  roots were sounded and found to be free and clear.  Once this was  completed, lumbodorsal fascia was then closed with #1 Vicryl in  interrupted fashion, 2-0 Vicryl using subcutaneous tissues and 3-0  Vicryl subcuticularly.  During the procedure  approximately 15-100 mL of  blood loss occurred 550 mL of the patient's own blood was recovered from  the Cell Saver.  The patient was returned to recovery room in stable  condition.      Stefani Dama, M.D.  Electronically Signed  HJE/MEDQ  D:  09/25/2008  T:  09/26/2008  Job:  [id number]

## 2010-08-30 NOTE — Op Note (Signed)
NAME:  Sydney [name], Sydney [name] NO.:  [id number]   MEDICAL RECORD NO.:  [id number]                   PATIENT TYPE:  INP   LOCATION:  5729                                 FACILITY:  [facility]   PHYSICIAN:  Thera Flake., M.D.             DATE OF BIRTH:  08-25-1932   DATE OF PROCEDURE:  09/09/2003  DATE OF DISCHARGE:                                 OPERATIVE REPORT   PREOPERATIVE DIAGNOSIS:  Prosthetic dislocation, left hip (posterior  superior).   POSTOPERATIVE DIAGNOSIS:  Prosthetic dislocation, left hip (posterior  superior).   PROCEDURE:  Closed reduction under anesthesia.   SURGEON:  Dyke Brackett, M.D.   ANESTHESIA:  General.   INDICATIONS:  The patient had had a spontaneous dislocation and relocation,  I believe untreated, four to five months ago, bending over at about 90  degrees, felt a pop, hip dislocated posterior superiorly, felt to be  amenable to closed reduction.   After general anesthesia, the hip was flexed about 95 degrees, internally  rotated and adducted.  She reduced easily.  It should be noted that at about  80 degrees with any internal rotation and adduction of the leg at all she  dislocated, meaning the significant issue really was flexion greater than  that amount.  She was confirmed to be in good position in the AP and lateral  plane on the fluoroscopy with range of motion.  Placed in a knee  immobilizer, taken to the recovery room in stable condition.                                               Thera Flake., M.D.    WDC/MEDQ  D:  09/09/2003  T:  09/10/2003  Job:  [id number]

## 2010-08-30 NOTE — Discharge Summary (Signed)
NAME:  Sydney [name], ALWIN NO.:  [id number]   MEDICAL RECORD NO.:  [id number]                   PATIENT TYPE:  INP   LOCATION:  5729                                 FACILITY:  [facility]   PHYSICIAN:  Dyke Brackett, M.D.                 DATE OF BIRTH:  [birth year]   DATE OF ADMISSION:  09/08/2003  DATE OF DISCHARGE:  09/10/2003                                 DISCHARGE SUMMARY   ADMISSION DIAGNOSES:  1. Left total hip dislocation.  2. Hypercholesterolemia.  3. Hypertension.   DISCHARGE DIAGNOSES:  1. Status post left prosthetic hip closed reduction and anesthesia.  2. Hypercholesterolemia.  3. Hypertension.   HISTORY OF PRESENT ILLNESS:  Ms. Sydney [name] is a 77 year old white female who  bent over to get some popcorn off the floor when she felt a hard pop in her  left hip.  The patient was unable to bear full weight on the left leg and  fell to the floor.  No loss of consciousness, no other injuries.  The  patient does have a history of a left total hip dislocation approximately 4  months ago with spontaneous reduction.   ALLERGIES:  No known drug allergies.   MEDICATIONS:  1. Lipitor 10 mg p.o. daily.  2. Premarin.  3. Medroxiprogesterone.  4. A blood pressure medicine.   SURGICAL PROCEDURE:  The patient was taken to the operating room on Sep 09, 2003 by Dr. Madelon Lips.  The patient was placed under general anesthesia and a  closed reduction of the left prosthetic hip was performed.  The position of  the hip was confirmed using fluoroscopy in the AP and lateral planes which  showed good position.  The patient was placed in a knee immobilizer and  taken to the recovery room in stable condition.   CONSULTATIONS:  The only consults were obtained while the patient was  hospitalized were CT and case management.   HOSPITAL COURSE:  On Sep 09, 2003, postoperatively, the patient was placed  in a left hip abductor brace by [employer].  Donning and removing the brace  was  explained to the patient and family by [employer].  On postoperative day 1 the  patient was comfortable. She underwent physical therapy and then was  discharged home in good stable condition.   LABORATORY DATA ON ADMISSION:  CBC was completely within normal limits.  Chemistries showed that potassium was 3.2, glucose 130, otherwise within  normal limits.   X-RAYS:  Left hip, 2 views performed on Sep 09, 2003 showing left hip  prosthesis dislocation.  AP pelvis performed on Sep 09, 2003 and showed left hip prosthesis  dislocation, no associated pelvic fractures.  A chest x-ray performed on Sep 09, 2003 showed cardiomegaly with a basilar nodule, no evidence of acute  disease otherwise.   Left hip single spot view performed in the operating room on Sep 09, 2003  showed location of the femoral head prosthesis.   EKG performed on Sep 09, 2003 showed a normal sinus rhythm, heart rate of 62  beats per minute, PR interval 170 milliseconds, PRT axis 47, 66 and 64.   DISCHARGE INSTRUCTIONS:  The patient was to resume home medications; add  Percocet 5 mg 1-2 tablets for pain  q.4-6h.   ACTIVITY:  As tolerated with a brace on.  May use long leg immobilizer at  night.   DIET:  No restrictions.   FOLLOW UP:  The patient needs to follow up with Dr. Madelon Lips in his office  the week of May 29th through June 3rd.  The patient is to call the office at  [phone] for an appointment.   CONDITION ON DISCHARGE:  The patient was discharged home in good stable  condition.      Richardean Canal, Arnetha Courser, M.D.    GC/MEDQ  D:  10/04/2003  T:  10/06/2003  Job:  [id number]

## 2010-08-30 NOTE — Assessment & Plan Note (Signed)
[facility]                             [facility] OFFICE NOTE   AKYLA, VAVREK                        MRN:          [id number]  DATE:12/03/2005                            DOB:          [birth year]    HISTORY OF PRESENT ILLNESS:  Sydney [name] is a very delightful 77 year old  woman who presents for routine follow up evaluation and examination.  She is  followed for hyperlipidemia and hypertension.  She was last seen in the  office for preoperative clearance on Sep 10, 2005.  She did have successful  surgery with bilateral external levator resection and bilateral lower lid  blepharoplasty.  She had good results with no complications.  The patient  reports she is feeling well and doing well and has no specific medical  complaints or problems.   PAST SURGICAL HISTORY:  1. Right hip replacement.  2. Left hip replacement.  3. Three disk lumbar surgery in the 1990's.  4. Left shoulder rotator cuff repair, open in 2002.  5. Blepharoplasty in May 2007.   PAST MEDICAL ILLNESSES:  1. Usual childhood diseases.  2. Scarlet fever.  3. Hypertension.  4. Hyperlipidemia.  5. Arthritic problems as above.   CURRENT MEDICATIONS:  1. Lipitor 40 mg daily.  2. Corgard 40 mg daily.  3. Triazene/hydrochlorothiazide 75/50 once daily.  4. Multivitamin and calcium.   FAMILY HISTORY:  Positive for heart disease in her father and her two  brothers, both of whom have now died, one after having bypass surgery.  Positive family history of diabetes in a sister who died from complications  of the same.  No family history for colon cancer or breast cancer.   SOCIAL HISTORY:  The patient has been married 55 years.  One son and one  daughter.  She has three grandsons.  She has two great grandsons.  The  patient is a retired Engineer, site.  She and her husband are doing well.  They do winter in [place].   REVIEW OF SYSTEMS:  The patient denies any constitutional  problems.  She is  up to date with Dr. Sheffield Slider for ophthalmology.  No ENT, Cardiovascular,  Respiratory, GI or GU complaints.   PHYSICAL EXAMINATION:  VITAL SIGNS:  Temperature 96.7, blood pressure  124/70, pulse 72, weight 217.4 pounds.  GENERAL APPEARANCE:  A heavy set Caucasian female who looks her stated age  in no acute distress.  GENERAL APPEARANCE:  Well-developed, well-nourished, Caucasian female who  looks his stated age in no acute distress.  HEENT:  Normocephalic, atraumatic.  EAC's and TM's are normal.  Oropharynx  with negative dentition, in good repair.  No buccal or palate lesions were  noted.  Posterior pharynx was clear.  Conjunctivae and sclerae were clear.  PERRLA.  EOMI.  Funduscopic examination with handheld instrument revealed  normal disk margins with no vascular abnormalities.  Periorbital structures  seemed to be intact with invisible lines of surgery.  She does have some  fluid collection in the infraorbital region.  NECK:  Supple without thyromegaly.  LYMPH NODES:  No adenopathy was  noted in the cervical or supraclavicular or  axillary regions.  CHEST:  No CVA tenderness.  LUNGS:  Clear to auscultation and percussion.  BREASTS:  Skin was clear.  Nipples without discharge.  There was no fixed  mass, lesion or abnormality noted to palpation.  CARDIOVASCULAR:  Radial pulses 2+.  No JVD or carotid bruits.  Had a quite  precordium with regular rate and rhythm without murmurs, rubs or gallops.  ABDOMEN:  Soft, no guarding or rebound.  No organosplenomegaly was noted.  PELVIC:  Examination NAT/BUS was normal.  Vaginal mucosa was atrophic and  she was dry making speculum introduction uncomfortable.  Cervix was patulous  with a cervical polyp at the 5 o'clock position.  Endocervical brushing and  Pap smear were performed without difficulty.  Bimanual exam deferred  secondary to patient's GERD.  RECTOVAGINAL:  Deferred secondary to patient having recent colonoscopy in   2004.  EXTREMITIES:  Without cyanosis, clubbing or edema or deformity.  NEUROLOGICAL:  Grossly nonfocal.   DATABASE ON ADMISSION:  Hemoglobin 15.2 g, white count 4200.  Chemistries  were unremarkable with serum glucose of 84.  Hemoglobin A1C was normal at  5.8%.  Renal function was normal with a creatinine of 1.1, liver functions  were normal.  Calcium mildly elevated at 10.9.  Cholesterol is 178,  triglycerides 105, HDL 54.7, LDL 102.   ASSESSMENT/PLAN:  1. Hypertension.  Patient with excellent control at this time.  She will      continue on her present medications.  Labs were normal.  2. Hyperlipidemia.  Patient with excellent control on her present medical      regimen.  She will continue with the same.  3. Hypercalcemia.  In reviewing the patient's chart, she did have an      elevated calcium of 10.9 in 2003.  Interval calcium was normal at 10.0,      now with recurrent 10.9.  The patient has had normal mammography, but      last study in the charts from 2004.  The patient is scheduled for      mammogram soon, and if this is a normal study, we will need to consider      getting an intact parathyroid hormone level.  4. Health Maintenance:  The patient is currently up to date with      colorectal cancer screening with colonoscopy in 2004 with follow up due      in 2014.  She will have a mammogram in the next week, report pending to      me.  She is otherwise current and up to date.   Mrs. Early is a very pleasant woman whose medical problems are stable.  She  does want to work on weight loss, and I have encouraged her to continue this  effort with calorie restricted diet, basically based on portion size and  smart choices and regular exercise.   The patient is asked to see me back in one year, and she will be  transferring her records to the [facility] office.                                   Rosalyn Gess Norins, MD   MEN/MedQ DD:  12/03/2005  DT:  12/04/2005  Job #:   [id number]   cc:   Feliz Beam

## 2010-08-30 NOTE — Op Note (Signed)
NAME:  JISELLA, ASHENFELTER NO.:  [id number]   MEDICAL RECORD NO.:  [id number]                   PATIENT TYPE:  INP   LOCATION:  5729                                 FACILITY:  [facility]   PHYSICIAN:  Thera Flake., M.D.             DATE OF BIRTH:  09/11/1932   DATE OF PROCEDURE:  DATE OF DISCHARGE:                                 OPERATIVE REPORT   Audio too short to transcribe (less than 5 seconds)                                               Thera Flake., M.D.    WDC/MEDQ  D:  09/09/2003  T:  09/09/2003  Job:  [id number]

## 2010-09-14 ENCOUNTER — Other Ambulatory Visit: Payer: Self-pay | Admitting: Internal Medicine

## 2010-11-06 ENCOUNTER — Other Ambulatory Visit (HOSPITAL_COMMUNITY): Payer: Self-pay | Admitting: Neurological Surgery

## 2010-11-06 DIAGNOSIS — M48061 Spinal stenosis, lumbar region without neurogenic claudication: Secondary | ICD-10-CM

## 2010-11-12 ENCOUNTER — Ambulatory Visit (HOSPITAL_COMMUNITY)
Admission: RE | Admit: 2010-11-12 | Discharge: 2010-11-12 | Disposition: A | Discharge status: Home / Self Care | Payer: Medicare Other | Source: Ambulatory Visit | Attending: Neurological Surgery | Admitting: Neurological Surgery

## 2010-11-12 ENCOUNTER — Encounter (HOSPITAL_COMMUNITY): Payer: Self-pay

## 2010-11-12 DIAGNOSIS — I723 Aneurysm of iliac artery: Secondary | ICD-10-CM | POA: Insufficient documentation

## 2010-11-12 DIAGNOSIS — M5126 Other intervertebral disc displacement, lumbar region: Secondary | ICD-10-CM | POA: Insufficient documentation

## 2010-11-12 DIAGNOSIS — M48061 Spinal stenosis, lumbar region without neurogenic claudication: Secondary | ICD-10-CM | POA: Insufficient documentation

## 2010-11-12 HISTORY — DX: Other specified postprocedural states: Z98.890

## 2010-11-12 MED ORDER — IOHEXOL 180 MG/ML  SOLN
14.0000 mL | Freq: Once | INTRAMUSCULAR | Status: AC | PRN
  Administered 2010-11-12: 14 mL via INTRATHECAL

## 2010-11-25 ENCOUNTER — Other Ambulatory Visit: Payer: Self-pay | Admitting: Internal Medicine

## 2010-12-02 ENCOUNTER — Encounter: Payer: Self-pay | Admitting: Vascular Surgery

## 2010-12-30 ENCOUNTER — Encounter: Payer: Self-pay | Admitting: Vascular Surgery

## 2010-12-31 ENCOUNTER — Ambulatory Visit (INDEPENDENT_AMBULATORY_CARE_PROVIDER_SITE_OTHER): Payer: Medicare Other | Admitting: Vascular Surgery

## 2010-12-31 ENCOUNTER — Encounter: Payer: Self-pay | Admitting: Vascular Surgery

## 2010-12-31 VITALS — BP 138/90 | HR 62 | Resp 22 | Ht 67.0 in | Wt 207.0 lb

## 2010-12-31 DIAGNOSIS — I723 Aneurysm of iliac artery: Secondary | ICD-10-CM

## 2010-12-31 NOTE — Consult Note (Signed)
NEW PATIENT CONSULTATION  Sydney, [name] DOB:  1932-09-07                                       12/31/2010 XBJYN#:[id number]  The patient presents today for evaluation of incidental finding of a right common iliac artery aneurysm which is 2 cm in size.  She has an extensive history of premature degenerative disk disease and underwent CT scan for further evaluation.  This did reveal an incidental finding of a 2 cm common iliac artery aneurysm.  There was no evidence of leak. She has no known history of aneurysm in the past.  She does have history of hypertension and elevated cholesterol.  No cardiac history.  Past surgical history is for rotator cuff repair, multiple spine surgeries and also 2 hip surgeries.  SOCIAL HISTORY:  She is a retired Runner, broadcasting/film/video.  She is married with 2 children.  She is to celebrate her 60th wedding anniversary tomorrow with her husband.  She does not smoke or drink alcohol.  FAMILY HISTORY:  Significant for premature heart disease and her dad dying at age 65.  REVIEW OF SYSTEMS:  No weight loss or gain.  Generally negative.  She does have degenerative disk disease in her back with arthritis, muscle pain and joint pain.  Review of systems otherwise negative.  PHYSICAL EXAM:  General:  A well-developed, well-nourished white female in no acute distress.  HEENT:  Normal.  Her carotid arteries without bruits bilaterally.  She has 2+ radial, 2+ femoral, 2+ popliteal and 2+ dorsalis pedis pulses bilaterally without any evidence of peripheral aneurysms.  Heart:  Regular rate and rhythm without murmur.  Chest: Clear bilaterally.  Abdomen:  Soft, mildly obese, nontender.  I do not feel an aneurysm.  Musculoskeletal:  No major deformities or cyanosis. Neurological:  No focal weakness, paresthesias.  Skin:  Without ulcers or rashes.  I did review her CT scan and discussed this with the patient and his wife.  I explained that this is an incidental  finding with small to moderate sized iliac artery aneurysm.  I recommended that we see her on a yearly basis for duplex surveillance and followup.  She will be seen in 1 year for ultrasound and office visit.  I did review symptoms of leaking aneurysm to her and explained this would be extremely unlikely and she knows to report immediately should this occur.    Larina Earthly, M.D. Electronically Signed  TFE/MEDQ  D:  12/31/2010  T:  12/31/2010  Job:  [id number]  cc:   Stefani Dama, M.D. Rosalyn Gess Norins, MD

## 2011-01-08 ENCOUNTER — Ambulatory Visit (INDEPENDENT_AMBULATORY_CARE_PROVIDER_SITE_OTHER): Payer: Medicare Other

## 2011-01-08 DIAGNOSIS — Z23 Encounter for immunization: Secondary | ICD-10-CM

## 2011-01-08 NOTE — Progress Notes (Signed)
Subjective:     Patient ID: Sydney [name], female   DOB: [birth year], 77 y.o.   MRN: [id number]  HPI Power outage, notes scanned in EPIC   Review of Systems     Objective:   Physical Exam     Assessment:         Plan:

## 2011-01-09 LAB — POCT I-STAT, CHEM 8
Calcium, Ion: 1.21
Glucose, Bld: 118 — ABNORMAL HIGH
HCT: 41
Hemoglobin: 13.9

## 2011-01-13 ENCOUNTER — Encounter: Payer: Self-pay | Admitting: Internal Medicine

## 2011-01-13 ENCOUNTER — Ambulatory Visit (INDEPENDENT_AMBULATORY_CARE_PROVIDER_SITE_OTHER): Payer: Medicare Other | Admitting: Internal Medicine

## 2011-01-13 ENCOUNTER — Other Ambulatory Visit: Payer: Self-pay | Admitting: Internal Medicine

## 2011-01-13 ENCOUNTER — Other Ambulatory Visit (INDEPENDENT_AMBULATORY_CARE_PROVIDER_SITE_OTHER): Payer: Medicare Other

## 2011-01-13 VITALS — BP 142/80 | HR 55 | Temp 97.8°F | Wt 215.0 lb

## 2011-01-13 DIAGNOSIS — Z1231 Encounter for screening mammogram for malignant neoplasm of breast: Secondary | ICD-10-CM

## 2011-01-13 DIAGNOSIS — Z0000 Encounter for general adult medical examination without abnormal findings: Secondary | ICD-10-CM

## 2011-01-13 DIAGNOSIS — I10 Essential (primary) hypertension: Secondary | ICD-10-CM

## 2011-01-13 DIAGNOSIS — Z136 Encounter for screening for cardiovascular disorders: Secondary | ICD-10-CM

## 2011-01-13 DIAGNOSIS — E785 Hyperlipidemia, unspecified: Secondary | ICD-10-CM

## 2011-01-13 DIAGNOSIS — I723 Aneurysm of iliac artery: Secondary | ICD-10-CM

## 2011-01-13 DIAGNOSIS — E663 Overweight: Secondary | ICD-10-CM

## 2011-01-13 DIAGNOSIS — M5137 Other intervertebral disc degeneration, lumbosacral region: Secondary | ICD-10-CM

## 2011-01-13 LAB — COMPREHENSIVE METABOLIC PANEL
AST: 23 U/L (ref 0–37)
Albumin: 4.4 g/dL (ref 3.5–5.2)
BUN: 26 mg/dL — ABNORMAL HIGH (ref 6–23)
Calcium: 10.2 mg/dL (ref 8.4–10.5)
Chloride: 103 mEq/L (ref 96–112)
Glucose, Bld: 118 mg/dL — ABNORMAL HIGH (ref 70–99)
Potassium: 3.6 mEq/L (ref 3.5–5.1)

## 2011-01-13 LAB — TSH: TSH: 0.25 u[IU]/mL — ABNORMAL LOW (ref 0.35–5.50)

## 2011-01-13 LAB — HEPATIC FUNCTION PANEL
ALT: 22 U/L (ref 0–35)
Albumin: 4.4 g/dL (ref 3.5–5.2)
Total Bilirubin: 0.9 mg/dL (ref 0.3–1.2)

## 2011-01-13 LAB — LDL CHOLESTEROL, DIRECT: Direct LDL: 123.6 mg/dL

## 2011-01-13 MED ORDER — NADOLOL 40 MG PO TABS: 40.0000 mg | ORAL_TABLET | Freq: Every day | ORAL | Status: DC

## 2011-01-13 MED ORDER — FUROSEMIDE 40 MG PO TABS: 40.0000 mg | ORAL_TABLET | Freq: Every day | ORAL | Status: DC

## 2011-01-13 MED ORDER — ATORVASTATIN CALCIUM 40 MG PO TABS: 40.0000 mg | ORAL_TABLET | Freq: Every day | ORAL | Status: DC

## 2011-01-13 NOTE — Progress Notes (Signed)
Subjective:    Patient ID: Sydney [name], female    DOB: 26-May-1932, 77 y.o.   MRN: [id number]  HPI  The patient is here for annual Medicare wellness examination and management of other chronic and acute problems.  She reports that she has had right lumbar back pain for two months. She is s/p vertebral fusion aprox. 1 year ago. Per pt an MRI 6 wks ago revealed no acute abnormalityof the lumbar spine. He pain is worse when laying down a night and is described as dull and throbbing. She does get relief with aleve and use of capsacin ceam. She has had PT in the past 3 months but did see much improvement in her symptoms but she is willing to try PT again.  An incidental finding from MRI was a 2 cm iliac aneurysm for which she has seen Dr. Arbie Cookey who felt there was no need for intervention which can be followed by duplex U/S.   In the interval since her last visit she has had several lesions excised by dermatology - "pre-cancerous."    The risk factors are reflected in the social history.  The roster of all physicians providing medical care to patient - is listed in the Snapshot section of the chart.  Activities of daily living:  The patient is 100% inedpendent in all ADLs: dressing, toileting, feeding as well as independent mobility  Home safety : The patient has smoke detectors in the home. They wear seatbelts.No firearms at home. There is no violence in the home.   There is no risks for hepatitis, STDs or HIV. There is no   history of blood transfusion. They have no travel history to infectious disease endemic areas of the world.  The patient has seen their dentist in the last six month. They have seen their eye doctor in the last year. They deny any hearing difficulty and have not had audiologic testing in the last year.  They do not  have excessive sun exposure. Discussed the need for sun protection: hats, long sleeves and use of sunscreen if there is significant sun exposure.   Diet: the  importance of a healthy diet is discussed. They do have a healthy (unhealthy-high fat/fast food) diet.  The patient has a regular exercise program: water aerobics , 1 hour duration, 5 times per week.  The benefits of regular aerobic exercise were discussed.  Depression screen: there are no signs or vegative symptoms of depression- irritability, change in appetite, anhedonia, sadness/tearfullness.  Cognitive assessment: the patient manages all their financial and personal affairs and is actively engaged. They could relate day,date,year and events  The following portions of the patient's history were reviewed and updated as appropriate: allergies, current medications, past family history, past medical history,  past surgical history, past social history  and problem list.  Vision, hearing, body mass index were assessed and reviewed.   During the course of the visit the patient was educated and counseled about appropriate screening and preventive services including : fall prevention , diabetes screening, nutrition counseling, colorectal cancer screening, and recommended immunizations.  Past Medical History  Diagnosis Date  . Hx of lumbosacral spine surgery t  . Hypertension   . Lumbar spinal stenosis   . Iliac artery aneurysm   . Hyperlipidemia    Past Surgical History  Procedure Date  . Spine surgery 2006 & 2010    posterior spinal fusion L4-5 L5S1  . Joint replacement 1992    right hip replacement  . Joint  replacement 2002    left hip replacement  . Rotator cuff repair 2002    Left shoulder   Family History  Problem Relation Age of Onset  . Heart disease Mother   . Heart disease Father   . Heart disease Sister   . Diabetes Sister   . Heart disease Brother   . Heart disease Brother   . Cancer Brother    History   Social History  . Marital Status: Married    Spouse Name: N/A    Number of Children: N/A  . Years of Education: N/A   Occupational History  . Not on file.     Social History Main Topics  . Smoking status: Never Smoker   . Smokeless tobacco: Not on file  . Alcohol Use: No  . Drug Use: No  . Sexually Active:    Other Topics Concern  . Not on file   Social History Narrative  . No narrative on file        Review of Systems Constitutional:  Negative for fever, chills, activity change. She has had a few pounds of weight gain during the year with a recent 3 lb loss. Marland Kitchen  HEENT:  Negative for hearing loss, ear pain, congestion, neck stiffness and postnasal drip. Negative for sore throat or swallowing problems. Negative for dental complaints.   Eyes: Negative for vision loss or change in visual acuity.  Respiratory: Negative for chest tightness and wheezing. Negative for DOE.   Cardiovascular: Negative for chest pain or palpitations. No decreased exercise tolerance Gastrointestinal: No change in bowel habit. No bloating or gas. No reflux or indigestion Genitourinary: Negative for urgency, frequency, flank pain and difficulty urinating.  Musculoskeletal: Negative for myalgias, back pain, arthralgias and gait problem.  Neurological: Negative for dizziness, tremors, weakness and headaches. She admits to feeling off balance with walking and has been using a single point cane.  Hematological: Negative for adenopathy.  Psychiatric/Behavioral: Negative for behavioral problems and dysphoric mood.       Objective:   Physical Exam Vitals reviewed- bradycardic at 57. Gen'l: well nourished, well developed white woman in no distress HEENT - [place]/AT, EACs/TMs normal, oropharynx with native dentition in good condition, no buccal or palatal lesions, posterior pharynx clear, mucous membranes moist. C&S clear, PERRLA, fundi - normal Neck - supple, no thyromegaly Nodes- negative submental, cervical, supraclavicular regions Chest - no deformity, no CVAT Lungs - cleat without rales, wheezes. No increased work of breathing Breast - deferred Cardiovascular -  regular bradycardia, quiet precordium, no murmurs, rubs or gallops, 2+ radial, DP and PT pulses Abdomen - BS+ x 4, no HSM, no guarding or rebound or tenderness Pelvic - deferred to gyn Rectal - deferred to gyn Back - tenderness to palpation at the right lumbar region. Extremities - no clubbing, cyanosis, edema or deformity.  Neuro - A&O x 3, CN II-XII normal, motor strength normal and equal, DTRs 2+ and symmetrical biceps, radial, and patellar tendons. Cerebellar - no tremor, no rigidity, fluid movement and normal gait. Derm - Head, neck, back, abdomen and extremities without suspicious lesions  Lab Results  Component Value Date   WBC 6.2 01/10/2010   HGB 15.0 01/10/2010   HCT 43.8 01/10/2010   PLT 193.0 01/10/2010   GLUCOSE 118* 01/13/2011   CHOL 204* 01/13/2011   TRIG 120.0 01/13/2011   HDL 60.50 01/13/2011   LDLDIRECT 123.6 01/13/2011   LDLCALC 129* 12/21/2007   ALT 22 01/13/2011   ALT 22 01/13/2011   AST  23 01/13/2011   AST 23 01/13/2011   NA 142 01/13/2011   K 3.6 01/13/2011   CL 103 01/13/2011   CREATININE 1.1 01/13/2011   BUN 26* 01/13/2011   CO2 28 01/13/2011   TSH 0.25* 01/13/2011           Assessment & Plan:

## 2011-01-15 ENCOUNTER — Encounter: Payer: Self-pay | Admitting: Internal Medicine

## 2011-01-18 DIAGNOSIS — I723 Aneurysm of iliac artery: Secondary | ICD-10-CM | POA: Insufficient documentation

## 2011-01-18 NOTE — Assessment & Plan Note (Addendum)
Persistent low back pain but no surgical issues based on recent MRI. MRI '10 did reveal central stenosis at the Lower lumbar region. Most recent MRI results not available. Her symptoms suggest that she may have mild to moderate spinal stenosis as etiology of her back pain.  Plan - Retrial of PT           Continue NSAID of choice (GI precautions discussed.)

## 2011-01-18 NOTE — Assessment & Plan Note (Signed)
She reports that BP readings at home with SBP in the 120's.  Plan - continue present medications           Resume exercise when possible.

## 2011-01-18 NOTE — Assessment & Plan Note (Signed)
Dr. Arbie Cookey has recommended conservative observation with follow-up duplex U/S in 1 year.

## 2011-01-18 NOTE — Assessment & Plan Note (Signed)
LDL at 123.6 is at goal of less than 130. She is tolerating present medication w/o adverse affects.  Plan - continue present regimen           Resume aerobic exercise when possible.

## 2011-01-18 NOTE — Assessment & Plan Note (Signed)
Interval medical history: low back pain; pre-malignant lesions excised; iliac anuerysm discovered. Physical exam remarkable for weight, sans pelvic exam - no indication after age 77. Due for q 2 year mammogram. Immunizations as noted. She will check for coverage of shingles vaccine. 12 lead EKG is normal w/o signs of ischemia or injury.  In summary - a very nice woman who is having on-going back pain but is otherwise medically stable. She will return as needed or in 1 year for routine exam.

## 2011-01-18 NOTE — Assessment & Plan Note (Signed)
She is carrying more weight than desirable. Her back pain has limited her exercise.  Plan - smart food choices, portion size control, resumption of safe exercise. Goal is to loose 1-2 lbs per month.

## 2011-01-22 ENCOUNTER — Ambulatory Visit (HOSPITAL_COMMUNITY)
Admission: RE | Admit: 2011-01-22 | Discharge: 2011-01-22 | Disposition: A | Discharge status: Home / Self Care | Payer: Medicare Other | Source: Ambulatory Visit | Attending: Internal Medicine | Admitting: Internal Medicine

## 2011-01-22 DIAGNOSIS — Z1231 Encounter for screening mammogram for malignant neoplasm of breast: Secondary | ICD-10-CM | POA: Insufficient documentation

## 2011-07-01 DIAGNOSIS — M79609 Pain in unspecified limb: Secondary | ICD-10-CM | POA: Diagnosis not present

## 2011-07-16 DIAGNOSIS — Z85828 Personal history of other malignant neoplasm of skin: Secondary | ICD-10-CM | POA: Diagnosis not present

## 2011-07-16 DIAGNOSIS — L578 Other skin changes due to chronic exposure to nonionizing radiation: Secondary | ICD-10-CM | POA: Diagnosis not present

## 2011-07-16 DIAGNOSIS — D1801 Hemangioma of skin and subcutaneous tissue: Secondary | ICD-10-CM | POA: Diagnosis not present

## 2011-07-16 DIAGNOSIS — L408 Other psoriasis: Secondary | ICD-10-CM | POA: Diagnosis not present

## 2011-10-02 DIAGNOSIS — L821 Other seborrheic keratosis: Secondary | ICD-10-CM | POA: Diagnosis not present

## 2011-10-02 DIAGNOSIS — D239 Other benign neoplasm of skin, unspecified: Secondary | ICD-10-CM | POA: Diagnosis not present

## 2011-10-02 DIAGNOSIS — L259 Unspecified contact dermatitis, unspecified cause: Secondary | ICD-10-CM | POA: Diagnosis not present

## 2011-12-01 ENCOUNTER — Ambulatory Visit: Payer: Medicare Other | Admitting: Internal Medicine

## 2011-12-01 ENCOUNTER — Ambulatory Visit (INDEPENDENT_AMBULATORY_CARE_PROVIDER_SITE_OTHER): Payer: Medicare Other | Admitting: Internal Medicine

## 2011-12-01 ENCOUNTER — Encounter: Payer: Self-pay | Admitting: Internal Medicine

## 2011-12-01 VITALS — BP 128/78 | HR 68 | Temp 98.1°F | Wt 216.0 lb

## 2011-12-01 DIAGNOSIS — I10 Essential (primary) hypertension: Secondary | ICD-10-CM | POA: Diagnosis not present

## 2011-12-01 DIAGNOSIS — K219 Gastro-esophageal reflux disease without esophagitis: Secondary | ICD-10-CM | POA: Diagnosis not present

## 2011-12-01 DIAGNOSIS — E785 Hyperlipidemia, unspecified: Secondary | ICD-10-CM

## 2011-12-01 NOTE — Assessment & Plan Note (Signed)
BP Readings from Last 3 Encounters:  12/01/11 128/78  01/13/11 142/80  12/31/10 138/90   Good control No changes needed Due for labs

## 2011-12-01 NOTE — Patient Instructions (Addendum)
Please start omeprazole 20mg  daily. Available over the counter (the generic of prilosec). Take on an empty stomach. Raise your head in bed so gravity helps keep acid in your stomach

## 2011-12-01 NOTE — Progress Notes (Signed)
Subjective:    Patient ID: Sydney [name], female    DOB: 1932-06-14, 78 y.o.   MRN: [id number]  HPI Has had post prandial epigastric burning for the past month If she drinks, it seems like it doesn't go down right---only occ Has burning sensation---esp bad at night Does note water brash No dysphagia in general Has 1 cup of coffee daily--no other caffeine or chocolate Pain is more at night--but doesn't eat for 5 hours before bedtime Hasn't tried anything  No distinct chest pain No SOB No choking  No wheezing  No problems with the statin No myalgias Current Outpatient Prescriptions on File Prior to Visit  Medication Sig Dispense Refill  . atorvastatin (LIPITOR) 40 MG tablet Take 1 tablet (40 mg total) by mouth daily.  90 tablet  3  . Calcium Carbonate-Vitamin D (CALTRATE 600+D) 600-400 MG-UNIT per tablet Take 1 tablet by mouth daily.        . furosemide (LASIX) 40 MG tablet Take 1 tablet (40 mg total) by mouth daily.  90 tablet  3  . nadolol (CORGARD) 40 MG tablet Take 1 tablet (40 mg total) by mouth daily.  90 tablet  3    Allergies  Allergen Reactions  . Latex Rash    Past Medical History  Diagnosis Date  . Hx of lumbosacral spine surgery t  . Hypertension   . Lumbar spinal stenosis   . Iliac artery aneurysm   . Hyperlipidemia     Past Surgical History  Procedure Date  . Spine surgery 2006 & 2010    posterior spinal fusion L4-5 L5S1  . Joint replacement 1992    right hip replacement  . Joint replacement 2002    left hip replacement  . Rotator cuff repair 2002    Left shoulder    Family History  Problem Relation Age of Onset  . Heart disease Mother   . Heart disease Father   . Heart disease Sister   . Diabetes Sister   . Heart disease Brother   . Heart disease Brother   . Cancer Brother     History   Social History  . Marital Status: Married    Spouse Name: N/A    Number of Children: N/A  . Years of Education: N/A   Occupational History  . Not  on file.   Social History Main Topics  . Smoking status: Never Smoker   . Smokeless tobacco: Never Used  . Alcohol Use: No  . Drug Use: No  . Sexually Active: Not on file   Other Topics Concern  . Not on file   Social History Narrative  . No narrative on file   Review of Systems No blood in stool or black stool Bowels regular Appetite is great Has lost 10# this year by working on healthier diet    Objective:   Physical Exam  Constitutional: She appears well-developed and well-nourished. No distress.  Neck: Normal range of motion. Neck supple. No thyromegaly present.  Cardiovascular: Normal rate, regular rhythm and normal heart sounds.  Exam reveals no gallop.   No murmur heard. Pulmonary/Chest: Effort normal and breath sounds normal. No respiratory distress. She has no wheezes. She has no rales.  Abdominal: Soft. Bowel sounds are normal. She exhibits no distension and no mass. There is no tenderness. There is no rebound and no guarding.  Musculoskeletal: She exhibits no edema and no tenderness.  Lymphadenopathy:    She has no cervical adenopathy.  Psychiatric: She has a  normal mood and affect. Her behavior is normal.          Assessment & Plan:

## 2011-12-01 NOTE — Assessment & Plan Note (Signed)
Classic symptoms of acid reflux Surprising that she hasn't had it before No changes in diet, etc to explain No worrisome features in history or exam  Will try trial of omeprazole Raise head of bed Recheck soon

## 2011-12-01 NOTE — Assessment & Plan Note (Signed)
Lab Results  Component Value Date   LDLCALC 129* 12/21/2007   Will check labs again No side effects Has iliac aneurysm but no CAD or cerebrovascular disease Discussed primary prevention---will continue med for now though

## 2011-12-02 ENCOUNTER — Encounter: Payer: Self-pay | Admitting: *Deleted

## 2011-12-02 LAB — CBC WITH DIFFERENTIAL/PLATELET
Basophils Relative: 0.5 % (ref 0.0–3.0)
Eosinophils Relative: 3.8 % (ref 0.0–5.0)
HCT: 43.2 % (ref 36.0–46.0)
Hemoglobin: 14.5 g/dL (ref 12.0–15.0)
Lymphocytes Relative: 26.4 % (ref 12.0–46.0)
Monocytes Relative: 11.3 % (ref 3.0–12.0)
Neutro Abs: 3.7 10*3/uL (ref 1.4–7.7)
RBC: 4.74 Mil/uL (ref 3.87–5.11)

## 2011-12-02 LAB — HEPATIC FUNCTION PANEL
ALT: 31 U/L (ref 0–35)
AST: 35 U/L (ref 0–37)
Albumin: 4.1 g/dL (ref 3.5–5.2)
Alkaline Phosphatase: 66 U/L (ref 39–117)
Bilirubin, Direct: 0 mg/dL (ref 0.0–0.3)
Total Bilirubin: 0.7 mg/dL (ref 0.3–1.2)
Total Protein: 7.6 g/dL (ref 6.0–8.3)

## 2011-12-02 LAB — LDL CHOLESTEROL, DIRECT: Direct LDL: 120.7 mg/dL

## 2011-12-02 LAB — LIPID PANEL
Cholesterol: 208 mg/dL — ABNORMAL HIGH (ref 0–200)
HDL: 53 mg/dL
Total CHOL/HDL Ratio: 4
Triglycerides: 291 mg/dL — ABNORMAL HIGH (ref 0.0–149.0)
VLDL: 58.2 mg/dL — ABNORMAL HIGH (ref 0.0–40.0)

## 2011-12-02 LAB — BASIC METABOLIC PANEL
CO2: 27 mEq/L (ref 19–32)
Calcium: 10.2 mg/dL (ref 8.4–10.5)
GFR: 52.81 mL/min — ABNORMAL LOW (ref >60.00)
Potassium: 4.2 mEq/L (ref 3.5–5.1)

## 2011-12-09 ENCOUNTER — Other Ambulatory Visit: Payer: Self-pay | Admitting: Internal Medicine

## 2011-12-11 ENCOUNTER — Other Ambulatory Visit: Payer: Self-pay | Admitting: Internal Medicine

## 2011-12-16 ENCOUNTER — Ambulatory Visit (INDEPENDENT_AMBULATORY_CARE_PROVIDER_SITE_OTHER): Payer: Medicare Other | Admitting: Internal Medicine

## 2011-12-16 ENCOUNTER — Encounter: Payer: Self-pay | Admitting: Internal Medicine

## 2011-12-16 VITALS — BP 122/80 | HR 60 | Temp 98.5°F | Ht 67.0 in | Wt 216.0 lb

## 2011-12-16 DIAGNOSIS — K219 Gastro-esophageal reflux disease without esophagitis: Secondary | ICD-10-CM | POA: Diagnosis not present

## 2011-12-16 NOTE — Progress Notes (Signed)
  Subjective:    Patient ID: Sydney [name], female    DOB: [birth year], 78 y.o.   MRN: [id number]  HPI Feels great Hasn't felt "this good" in 2 months In retrospect--she was having symptoms after eating for some time  No dysphagia Did have EGD some years ago  Appetite is fine Bowels are okay  Current Outpatient Prescriptions on File Prior to Visit  Medication Sig Dispense Refill  . atorvastatin (LIPITOR) 40 MG tablet Take 1 tablet (40 mg total) by mouth daily.  90 tablet  3  . Calcium Carbonate-Vitamin D (CALTRATE 600+D) 600-400 MG-UNIT per tablet Take 1 tablet by mouth daily.        . furosemide (LASIX) 40 MG tablet Take 1 tablet (40 mg total) by mouth daily.  90 tablet  3  . nadolol (CORGARD) 40 MG tablet TAKE 1 TABLET BY MOUTH EVERY DAY  90 tablet  0  . omeprazole (PRILOSEC) 20 MG capsule Take 20 mg by mouth daily. Take on empty stomach in the evening        Allergies  Allergen Reactions  . Latex Rash    Past Medical History  Diagnosis Date  . Hx of lumbosacral spine surgery t  . Hypertension   . Lumbar spinal stenosis   . Iliac artery aneurysm   . Hyperlipidemia   . GERD (gastroesophageal reflux disease)     Past Surgical History  Procedure Date  . Spine surgery 2006 & 2010    posterior spinal fusion L4-5 L5S1  . Joint replacement 1992    right hip replacement  . Joint replacement 2002    left hip replacement  . Rotator cuff repair 2002    Left shoulder    Family History  Problem Relation Age of Onset  . Heart disease Mother   . Heart disease Father   . Heart disease Sister   . Diabetes Sister   . Heart disease Brother   . Heart disease Brother   . Cancer Brother     History   Social History  . Marital Status: Married    Spouse Name: N/A    Number of Children: N/A  . Years of Education: N/A   Occupational History  . Not on file.   Social History Main Topics  . Smoking status: Never Smoker   . Smokeless tobacco: Never Used  . Alcohol Use: No   . Drug Use: No  . Sexually Active: Not on file   Other Topics Concern  . Not on file   Social History Narrative  . No narrative on file   Review of Systems Careful with diet but still can't lose weight Does do 30 minutes on "Nu step" because she can't walk well    Objective:   Physical Exam  Constitutional: She appears well-developed and well-nourished. No distress.  Abdominal: Soft. There is no tenderness.  Skin:       Small cyst vs. Lipoma over right acromion          Assessment & Plan:

## 2011-12-16 NOTE — Patient Instructions (Signed)
Please continue the omeprazole daily for another 4 weeks. If you continue to do well then, you can cut down to every other day till your November appointment

## 2011-12-16 NOTE — Assessment & Plan Note (Signed)
Clear excellent response to PPI Discussed finishing out 6 week course then trying to wean

## 2011-12-22 ENCOUNTER — Encounter: Payer: Self-pay | Admitting: Vascular Surgery

## 2011-12-23 ENCOUNTER — Ambulatory Visit (INDEPENDENT_AMBULATORY_CARE_PROVIDER_SITE_OTHER): Payer: Medicare Other | Admitting: Vascular Surgery

## 2011-12-23 ENCOUNTER — Other Ambulatory Visit (INDEPENDENT_AMBULATORY_CARE_PROVIDER_SITE_OTHER): Payer: Medicare Other | Admitting: *Deleted

## 2011-12-23 ENCOUNTER — Encounter: Payer: Self-pay | Admitting: Vascular Surgery

## 2011-12-23 VITALS — BP 150/71 | HR 58 | Resp 16 | Ht 67.0 in | Wt 215.0 lb

## 2011-12-23 DIAGNOSIS — I723 Aneurysm of iliac artery: Secondary | ICD-10-CM | POA: Diagnosis not present

## 2011-12-23 DIAGNOSIS — I714 Abdominal aortic aneurysm, without rupture, unspecified: Secondary | ICD-10-CM | POA: Insufficient documentation

## 2011-12-23 NOTE — Progress Notes (Signed)
The patient presents today for followup of her incidentally found iliac artery aneurysm. She underwent CT scan for degenerative disc disease and was found to have a 2 cm right common iliac artery aneurysm. She has no symptoms referable to this. She did have an episode of epigastric pain and was diagnosed with gastroesophageal reflux disease and has had good response with treatment of this. Her discomfort is completely resolved. She has no pelvic pain and no new back pain.  Past Medical History  Diagnosis Date  . Hx of lumbosacral spine surgery t  . Hypertension   . Lumbar spinal stenosis   . Iliac artery aneurysm   . Hyperlipidemia   . GERD (gastroesophageal reflux disease)     History  Substance Use Topics  . Smoking status: Never Smoker   . Smokeless tobacco: Never Used  . Alcohol Use: No    Family History  Problem Relation Age of Onset  . Heart disease Mother   . Heart disease Father   . Heart disease Sister   . Diabetes Sister   . Heart disease Brother   . Heart disease Brother   . Cancer Brother     Allergies  Allergen Reactions  . Latex Rash    Current outpatient prescriptions:atorvastatin (LIPITOR) 40 MG tablet, Take 1 tablet (40 mg total) by mouth daily., Disp: 90 tablet, Rfl: 3;  Calcium Carbonate-Vitamin D (CALTRATE 600+D) 600-400 MG-UNIT per tablet, Take 1 tablet by mouth daily.  , Disp: , Rfl: ;  furosemide (LASIX) 40 MG tablet, Take 1 tablet (40 mg total) by mouth daily., Disp: 90 tablet, Rfl: 3 nadolol (CORGARD) 40 MG tablet, TAKE 1 TABLET BY MOUTH EVERY DAY, Disp: 90 tablet, Rfl: 0;  omeprazole (PRILOSEC) 20 MG capsule, Take 20 mg by mouth daily. Take on empty stomach in the evening, Disp: , Rfl:   BP 150/71  Pulse 58  Resp 16  Ht 5\' 7"  (1.702 m)  Wt 215 lb (97.523 kg)  BMI 33.67 kg/m2  SpO2 97%  Body mass index is 33.67 kg/(m^2).       Physical exam well-developed well-nourished white female in no acute distress Chest clear with equal breath  sounds bilaterally Heart regular rate and rhythm without murmur Abdomen soft moderately obese no tenderness and no aneurysm palpable Neurologically she is grossly Pulse status 2+ radial 2+ femoral 2+ popliteal and dorsalis pedis pulses bilaterally with no evidence of peripheral aneurysms  Plexus was evaluated and interpret in our office. This shows no change with a maximal diameter of 1.8 cm right common iliac artery aneurysm.  Impression and plan no growth an ACE right common iliac artery aneurysm. This was 2 cm by CT and 1.8 by ultrasound. I recommended yearly ultrasound to rule out any progression in size. I again explained symptoms of leaking aneurysm to the patient who will present immediate should this occur

## 2011-12-23 NOTE — Addendum Note (Signed)
Addended by: Sharee Pimple on: 12/23/2011 01:27 PM   Modules accepted: Orders

## 2012-01-06 ENCOUNTER — Other Ambulatory Visit: Payer: BC Managed Care – PPO

## 2012-01-06 ENCOUNTER — Ambulatory Visit: Payer: BC Managed Care – PPO | Admitting: Vascular Surgery

## 2012-01-15 ENCOUNTER — Ambulatory Visit (INDEPENDENT_AMBULATORY_CARE_PROVIDER_SITE_OTHER): Payer: Medicare Other

## 2012-01-15 DIAGNOSIS — Z23 Encounter for immunization: Secondary | ICD-10-CM

## 2012-02-10 ENCOUNTER — Ambulatory Visit (INDEPENDENT_AMBULATORY_CARE_PROVIDER_SITE_OTHER): Payer: Medicare Other | Admitting: Internal Medicine

## 2012-02-10 ENCOUNTER — Encounter: Payer: Self-pay | Admitting: Internal Medicine

## 2012-02-10 VITALS — BP 140/70 | HR 62 | Temp 98.1°F | Resp 12 | Wt 214.0 lb

## 2012-02-10 DIAGNOSIS — J209 Acute bronchitis, unspecified: Secondary | ICD-10-CM

## 2012-02-10 MED ORDER — AMOXICILLIN 500 MG PO TABS: 1000.0000 mg | ORAL_TABLET | Freq: Two times a day (BID) | ORAL | Status: DC

## 2012-02-10 NOTE — Progress Notes (Signed)
  Subjective:    Patient ID: Sydney [name], female    DOB: July 10, 1932, 78 y.o.   MRN: [id number]  HPI Has been coughing for 2 weeks or so Productive of dark green sputum No fever Does feel hot--- some sweat with coughing at night. No chills or shakes Slight SOB only when mucus stuck in throat Some sore throat Left ear pain Some nasal congestion and drainage Cough is worse when first lying down at night or in AM  Tried robitussin and mucinex Seems to have helped some  Current Outpatient Prescriptions on File Prior to Visit  Medication Sig Dispense Refill  . nadolol (CORGARD) 40 MG tablet TAKE 1 TABLET BY MOUTH EVERY DAY  90 tablet  0  . omeprazole (PRILOSEC) 20 MG capsule Take 20 mg by mouth daily. Take on empty stomach in the evening      . DISCONTD: atorvastatin (LIPITOR) 40 MG tablet Take 1 tablet (40 mg total) by mouth daily.  90 tablet  3  . DISCONTD: furosemide (LASIX) 40 MG tablet Take 1 tablet (40 mg total) by mouth daily.  90 tablet  3    Allergies  Allergen Reactions  . Latex Rash    Past Medical History  Diagnosis Date  . Hx of lumbosacral spine surgery t  . Hypertension   . Lumbar spinal stenosis   . Iliac artery aneurysm   . Hyperlipidemia   . GERD (gastroesophageal reflux disease)     Past Surgical History  Procedure Date  . Spine surgery 2006 & 2010    posterior spinal fusion L4-5 L5S1  . Joint replacement 1992    right hip replacement  . Joint replacement 2002    left hip replacement  . Rotator cuff repair 2002    Left shoulder    Family History  Problem Relation Age of Onset  . Heart disease Mother   . Heart disease Father   . Heart disease Sister   . Diabetes Sister   . Heart disease Brother   . Heart disease Brother   . Cancer Brother     History   Social History  . Marital Status: Married    Spouse Name: N/A    Number of Children: N/A  . Years of Education: N/A   Occupational History  . Not on file.   Social History Main  Topics  . Smoking status: Never Smoker   . Smokeless tobacco: Never Used  . Alcohol Use: No  . Drug Use: No  . Sexually Active: Not on file   Other Topics Concern  . Not on file   Social History Narrative  . No narrative on file   Review of Systems No rash No vomiting or diarrhea Heartburn still quiet---only uses the omeprazole prn     Objective:   Physical Exam  Constitutional: She appears well-developed and well-nourished. No distress.  HENT:  Mouth/Throat: Oropharynx is clear and moist. No oropharyngeal exudate.       TMs normal No sinus tenderness Mild congestion in nose--but not major  Neck: Normal range of motion. Neck supple.  Pulmonary/Chest: Effort normal and breath sounds normal. No respiratory distress. She has no wheezes. She has no rales.  Lymphadenopathy:    She has no cervical adenopathy.          Assessment & Plan:

## 2012-02-10 NOTE — Patient Instructions (Signed)
Please call for a different antibiotic on Friday if not feeling better

## 2012-02-10 NOTE — Assessment & Plan Note (Signed)
May have component of sinus too with drainage when supine and ear pain Will treat with amoxil Change to augmentin in 3 days if not better Supportive care

## 2012-02-13 ENCOUNTER — Telehealth: Payer: Self-pay

## 2012-02-13 MED ORDER — AMOXICILLIN-POT CLAVULANATE 875-125 MG PO TABS: 1.0000 | ORAL_TABLET | Freq: Two times a day (BID) | ORAL | Status: DC

## 2012-02-13 NOTE — Telephone Encounter (Signed)
Sent, please notify pt

## 2012-02-13 NOTE — Telephone Encounter (Signed)
Pt left v/m  Pt saw Dr Alphonsus Sias 02/10/12 pt was to call back Fri if not better and Dr Alphonsus Sias would give stronger antibiotic; pt's congestion and productive cough with dark green phlegm is no better and wants stronger antibiotic sent to [facility]. Pt request call back when med called in. Please seen 02/10/12;Dr Alphonsus Sias said would change antibiotic in 3 days to Augmentin in pt not better.Please advise.

## 2012-02-13 NOTE — Telephone Encounter (Signed)
Patient advised.

## 2012-02-17 ENCOUNTER — Ambulatory Visit: Payer: Medicare Other | Admitting: Internal Medicine

## 2012-02-26 ENCOUNTER — Other Ambulatory Visit: Payer: Self-pay | Admitting: Internal Medicine

## 2012-02-26 ENCOUNTER — Other Ambulatory Visit: Payer: Self-pay

## 2012-02-26 NOTE — Telephone Encounter (Signed)
Pt request status of refill on Nadolol to [facility]. Notified pt by phone refill sent.

## 2012-03-25 ENCOUNTER — Other Ambulatory Visit: Payer: Self-pay

## 2012-03-25 MED ORDER — FUROSEMIDE 40 MG PO TABS: 40.0000 mg | ORAL_TABLET | Freq: Every day | ORAL | Status: DC

## 2012-03-25 NOTE — Telephone Encounter (Signed)
rx sent to pharmacy by e-script  

## 2012-03-25 NOTE — Telephone Encounter (Signed)
Okay #30 x 3 Should schedule follow up for when she is back in [place]

## 2012-03-25 NOTE — Telephone Encounter (Signed)
Sydney [name] with [facility] left v/m requesting Furosemide refill; last filled 03/08/12.Originally written by Dr Debby Bud. Pt spending winter in [place]. Sydney [name] [facility] [place].Please advise.

## 2012-05-23 ENCOUNTER — Other Ambulatory Visit: Payer: Self-pay | Admitting: Internal Medicine

## 2012-05-24 ENCOUNTER — Other Ambulatory Visit: Payer: Self-pay | Admitting: *Deleted

## 2012-05-24 MED ORDER — ATORVASTATIN CALCIUM 40 MG PO TABS: 40.0000 mg | ORAL_TABLET | Freq: Every day | ORAL | Status: DC

## 2012-05-28 ENCOUNTER — Other Ambulatory Visit: Payer: Self-pay | Admitting: Internal Medicine

## 2012-07-16 ENCOUNTER — Other Ambulatory Visit: Payer: Self-pay | Admitting: Internal Medicine

## 2012-09-01 DIAGNOSIS — IMO0002 Reserved for concepts with insufficient information to code with codable children: Secondary | ICD-10-CM | POA: Diagnosis not present

## 2012-09-01 DIAGNOSIS — M545 Low back pain: Secondary | ICD-10-CM | POA: Diagnosis not present

## 2012-11-04 ENCOUNTER — Other Ambulatory Visit: Payer: Self-pay | Admitting: Internal Medicine

## 2012-11-30 ENCOUNTER — Encounter: Payer: Self-pay | Admitting: Internal Medicine

## 2012-11-30 ENCOUNTER — Ambulatory Visit (INDEPENDENT_AMBULATORY_CARE_PROVIDER_SITE_OTHER): Payer: Medicare Other | Admitting: Internal Medicine

## 2012-11-30 VITALS — BP 138/80 | HR 97 | Temp 98.6°F | Ht 67.0 in | Wt 218.0 lb

## 2012-11-30 DIAGNOSIS — I10 Essential (primary) hypertension: Secondary | ICD-10-CM | POA: Diagnosis not present

## 2012-11-30 DIAGNOSIS — I714 Abdominal aortic aneurysm, without rupture: Secondary | ICD-10-CM

## 2012-11-30 DIAGNOSIS — M5137 Other intervertebral disc degeneration, lumbosacral region: Secondary | ICD-10-CM | POA: Diagnosis not present

## 2012-11-30 DIAGNOSIS — E785 Hyperlipidemia, unspecified: Secondary | ICD-10-CM | POA: Diagnosis not present

## 2012-11-30 DIAGNOSIS — Z0000 Encounter for general adult medical examination without abnormal findings: Secondary | ICD-10-CM | POA: Insufficient documentation

## 2012-11-30 DIAGNOSIS — E669 Obesity, unspecified: Secondary | ICD-10-CM

## 2012-11-30 LAB — CBC WITH DIFFERENTIAL/PLATELET
Basophils Relative: 0.6 % (ref 0.0–3.0)
Eosinophils Relative: 3.2 % (ref 0.0–5.0)
Hemoglobin: 15.6 g/dL — ABNORMAL HIGH (ref 12.0–15.0)
Lymphocytes Relative: 29.1 % (ref 12.0–46.0)
Neutrophils Relative %: 55.8 % (ref 43.0–77.0)
RBC: 5.08 Mil/uL (ref 3.87–5.11)
WBC: 5.7 10*3/uL (ref 4.5–10.5)

## 2012-11-30 LAB — LIPID PANEL
Cholesterol: 206 mg/dL — ABNORMAL HIGH (ref 0–200)
Total CHOL/HDL Ratio: 4
Triglycerides: 165 mg/dL — ABNORMAL HIGH (ref 0.0–149.0)
VLDL: 33 mg/dL (ref 0.0–40.0)

## 2012-11-30 LAB — HEPATIC FUNCTION PANEL
AST: 22 U/L (ref 0–37)
Albumin: 4.3 g/dL (ref 3.5–5.2)
Total Bilirubin: 0.8 mg/dL (ref 0.3–1.2)

## 2012-11-30 LAB — BASIC METABOLIC PANEL
Calcium: 10.3 mg/dL (ref 8.4–10.5)
Chloride: 106 mEq/L (ref 96–112)
Creatinine, Ser: 1.1 mg/dL (ref 0.4–1.2)
Sodium: 142 mEq/L (ref 135–145)

## 2012-11-30 LAB — LDL CHOLESTEROL, DIRECT: Direct LDL: 129.9 mg/dL

## 2012-11-30 MED ORDER — ZOSTER VACCINE LIVE 19400 UNT/0.65ML ~~LOC~~ SOLR: 0.6500 mL | Freq: Once | SUBCUTANEOUS | Status: DC

## 2012-11-30 NOTE — Patient Instructions (Addendum)
Please stop the atorvastatin for 2 weeks. If your mind seems clearer without it, stay off it and call me----I will change you to a different cholesterol medication. If no difference, go back on it.  DASH Diet The DASH diet stands for "Dietary Approaches to Stop Hypertension." It is a healthy eating plan that has been shown to reduce high blood pressure (hypertension) in as little as 14 days, while also possibly providing other significant health benefits. These other health benefits include reducing the risk of breast cancer after menopause and reducing the risk of type 2 diabetes, heart disease, colon cancer, and stroke. Health benefits also include weight loss and slowing kidney failure in patients with chronic kidney disease.  DIET GUIDELINES  Limit salt (sodium). Your diet should contain less than 1500 mg of sodium daily.  Limit refined or processed carbohydrates. Your diet should include mostly whole grains. Desserts and added sugars should be used sparingly.  Include small amounts of heart-healthy fats. These types of fats include nuts, oils, and tub margarine. Limit saturated and trans fats. These fats have been shown to be harmful in the body. CHOOSING FOODS  The following food groups are based on a 2000 calorie diet. See your Registered Dietitian for individual calorie needs. Grains and Grain Products (6 to 8 servings daily)  Eat More Often: Whole-wheat bread, brown rice, whole-grain or wheat pasta, quinoa, popcorn without added fat or salt (air popped).  Eat Less Often: White bread, white pasta, white rice, cornbread. Vegetables (4 to 5 servings daily)  Eat More Often: Fresh, frozen, and canned vegetables. Vegetables may be raw, steamed, roasted, or grilled with a minimal amount of fat.  Eat Less Often/Avoid: Creamed or fried vegetables. Vegetables in a cheese sauce. Fruit (4 to 5 servings daily)  Eat More Often: All fresh, canned (in natural juice), or frozen fruits. Dried fruits  without added sugar. One hundred percent fruit juice ( cup [237 mL] daily).  Eat Less Often: Dried fruits with added sugar. Canned fruit in light or heavy syrup. [employer], Fish, and Poultry (2 servings or less daily. One serving is 3 to 4 oz [85-114 g]).  Eat More Often: Ninety percent or leaner ground beef, tenderloin, sirloin. Round cuts of beef, chicken breast, [place] breast. All fish. Grill, bake, or broil your meat. Nothing should be fried.  Eat Less Often/Avoid: Fatty cuts of meat, [place], or chicken leg, thigh, or wing. Fried cuts of meat or fish. Dairy (2 to 3 servings)  Eat More Often: Low-fat or fat-free milk, low-fat plain or light yogurt, reduced-fat or part-skim cheese.  Eat Less Often/Avoid: Milk (whole, 2%).Whole milk yogurt. Full-fat cheeses. Nuts, Seeds, and Legumes (4 to 5 servings per week)  Eat More Often: All without added salt.  Eat Less Often/Avoid: Salted nuts and seeds, canned beans with added salt. Fats and Sweets (limited)  Eat More Often: Vegetable oils, tub margarines without trans fats, sugar-free gelatin. Mayonnaise and salad dressings.  Eat Less Often/Avoid: Coconut oils, palm oils, butter, stick margarine, cream, half and half, cookies, candy, pie. FOR MORE INFORMATION The Dash Diet Eating Plan: www.dashdiet.org Document Released: 03/20/2011 Document Revised: 06/23/2011 Document Reviewed: 03/20/2011 [facility] Patient Information 2014 [place], [place].

## 2012-11-30 NOTE — Assessment & Plan Note (Signed)
Some trouble with "fuzziness" Will try off the atorvastatin briefly---if better, will change to pravastatin

## 2012-11-30 NOTE — Progress Notes (Signed)
Subjective:    Patient ID: Sydney [name], female    DOB: 19-Dec-1932, 79 y.o.   MRN: [id number]  HPI Here for Medicare wellness and follow up Reviewed advanced directives Tries to do Nu-step at home regularly. Water exercises when in [place] No vision or hearing problems No tobacco products and doesn't drink Reviewed other doctors Vision and hearing are okay No memory problems No falls No depression or anhedonia  Feels "dazy" at times---- seems to be after the atorvastatin No myalgias Keeps up with vascular surgeon  Ongoing back problems Very hard to walk--pseudoclaudication Keeps up with Dr Larey Days action needed  No chest pain NO SOB No dizziness or syncope No edema No change in exercise tolerance  Current Outpatient Prescriptions on File Prior to Visit  Medication Sig Dispense Refill  . atorvastatin (LIPITOR) 40 MG tablet TAKE 1 TABLET BY MOUTH DAILY  90 tablet  1  . furosemide (LASIX) 40 MG tablet TAKE 1 TABLET BY MOUTH EVERY DAY  90 tablet  1  . nadolol (CORGARD) 40 MG tablet TAKE 1 TABLET BY MOUTH EVERY DAY  90 tablet  2   No current facility-administered medications on file prior to visit.    Allergies  Allergen Reactions  . Latex Rash    Past Medical History  Diagnosis Date  . Hx of lumbosacral spine surgery t  . Hypertension   . Lumbar spinal stenosis   . Iliac artery aneurysm   . Hyperlipidemia   . GERD (gastroesophageal reflux disease)     Past Surgical History  Procedure Laterality Date  . Spine surgery  2006 & 2010    posterior spinal fusion L4-5 L5S1  . Joint replacement  1992    right hip replacement  . Joint replacement  2002    left hip replacement  . Rotator cuff repair  2002    Left shoulder    Family History  Problem Relation Age of Onset  . Heart disease Mother   . Heart disease Father   . Heart disease Sister   . Diabetes Sister   . Heart disease Brother   . Heart disease Brother   . Cancer Brother     History    Social History  . Marital Status: Married    Spouse Name: N/A    Number of Children: N/A  . Years of Education: N/A   Occupational History  . Not on file.   Social History Main Topics  . Smoking status: Never Smoker   . Smokeless tobacco: Never Used  . Alcohol Use: No  . Drug Use: No  . Sexual Activity: Not on file   Other Topics Concern  . Not on file   Social History Narrative   Has living will   Husband is health care POA   Discussed DNR--she requests (done 11/30/12)   No tube feedings if cognitively unaware   Review of Systems Weight is up a few pounds despite her attempts Doesn't drink calories Will be going to [place] for the winter soon Sleeps okay Bowels are fine    Objective:   Physical Exam  Constitutional: She is oriented to person, place, and time. She appears well-developed and well-nourished. No distress.  HENT:  Mouth/Throat: Oropharynx is clear and moist. No oropharyngeal exudate.  Neck: Normal range of motion. Neck supple. No thyromegaly present.  Cardiovascular: Normal rate, regular rhythm, normal heart sounds and intact distal pulses.  Exam reveals no gallop.   No murmur heard. Pulmonary/Chest: Effort normal and breath sounds  normal. No respiratory distress. She has no wheezes. She has no rales.  Abdominal: Soft. There is no tenderness.  Musculoskeletal: She exhibits no edema and no tenderness.  Lymphadenopathy:    She has no cervical adenopathy.  Neurological: She is alert and oriented to person, place, and time.  President-- "Obama, Bush, Clinton" [id number] Recall 2/3  Psychiatric: She has a normal mood and affect. Her behavior is normal.          Assessment & Plan:

## 2012-11-30 NOTE — Assessment & Plan Note (Signed)
I have personally reviewed the Medicare Annual Wellness questionnaire and have noted 1. The patient's medical and social history 2. Their use of alcohol, tobacco or illicit drugs 3. Their current medications and supplements 4. The patient's functional ability including ADL's, fall risks, home safety risks and hearing or visual             impairment. 5. Diet and physical activities 6. Evidence for depression or mood disorders  The patients weight, height, BMI and visual acuity have been recorded in the chart I have made referrals, counseling and provided education to the patient based review of the above and I have provided the pt with a written personalized care plan for preventive services.  I have provided you with a copy of your personalized plan for preventive services. Please take the time to review along with your updated medication list.  Needs to work on fitness zostavax Rx sent No other issues No cancer screening due to age

## 2012-11-30 NOTE — Assessment & Plan Note (Signed)
Will start 81mg  aspirin every other day On statin and beta blocker

## 2012-11-30 NOTE — Assessment & Plan Note (Signed)
DASH diet info given 

## 2012-11-30 NOTE — Assessment & Plan Note (Signed)
With lumbar spinal stenosis Limiting but not painful

## 2012-11-30 NOTE — Assessment & Plan Note (Signed)
BP Readings from Last 3 Encounters:  11/30/12 138/80  02/10/12 140/70  12/23/11 150/71   Good control Due for labs

## 2012-12-15 ENCOUNTER — Encounter: Payer: Self-pay | Admitting: *Deleted

## 2012-12-20 ENCOUNTER — Telehealth: Payer: Self-pay

## 2012-12-20 NOTE — Telephone Encounter (Signed)
Pt seen 11/30/12 and stopped atorvastatin;pt said she is feeling much better. pt said the fuzziness is mostly gone, no more nausea and vomiting. Pt does feel dizzy when gets up quickly, if pt does not get up quickly she is not dizzy. Advised pt to continue getting up slowly. Pt wants to know if she can stay off the atorvastatin.Please advise. [facility].

## 2012-12-20 NOTE — Telephone Encounter (Signed)
Yes Have her stay off the atorvastatin and put it on allergy list as adverse reaction  We will discuss this again at her next visit

## 2012-12-22 NOTE — Telephone Encounter (Signed)
Please call pt, she just has a few questions about the atorvastatin, she will be going to [facility] for 5 months and didn't want to wait that long to discuss.

## 2012-12-22 NOTE — Telephone Encounter (Signed)
Discussed our options I think she would be best to try another statin but am concerned about starting something new when she is leaving for [place] for the next few months We will hold off and reconsider when she gets back in town  She was happy with that disposition

## 2012-12-27 ENCOUNTER — Encounter: Payer: Self-pay | Admitting: Family

## 2012-12-28 ENCOUNTER — Ambulatory Visit (INDEPENDENT_AMBULATORY_CARE_PROVIDER_SITE_OTHER): Payer: Medicare Other | Admitting: Family

## 2012-12-28 ENCOUNTER — Other Ambulatory Visit (INDEPENDENT_AMBULATORY_CARE_PROVIDER_SITE_OTHER): Payer: Medicare Other | Admitting: *Deleted

## 2012-12-28 ENCOUNTER — Encounter: Payer: Self-pay | Admitting: Family

## 2012-12-28 VITALS — BP 125/76 | HR 58 | Resp 14 | Ht 67.0 in | Wt 215.0 lb

## 2012-12-28 DIAGNOSIS — I723 Aneurysm of iliac artery: Secondary | ICD-10-CM | POA: Diagnosis not present

## 2012-12-28 NOTE — Progress Notes (Signed)
[facility] [place]  Established Abdominal Aortic Aneurysm  History of Present Illness  Sydney [name] is a 79 y.o. ([birth year]) female who presents today for followup of her incidentally found iliac artery aneurysm. She underwent CT scan for degenerative disc disease and was found to have a 2 cm right common iliac artery aneurysm.  Her risk factors for aneurysmal growth are not the following: is not diabetic, does not use tobacco, blood pressure is well controlled, is not sedentary, takes a 81 mg ASA qod . A possible risk factor for her is dyslipidemia as she had to be taken off the Lipitor that she was on for years due to the "fuzzy feeling" she got from this but her PCP is closely monitoring her cholesterol, and the patient states that her last total cholesterol was 200. Patient denies abdominal or back pain.  Pt smoker: non-smoker, never smoked.  Past Medical History  Diagnosis Date  . Hx of lumbosacral spine surgery t  . Hypertension   . Lumbar spinal stenosis   . Iliac artery aneurysm   . Hyperlipidemia   . GERD (gastroesophageal reflux disease)    Past Surgical History  Procedure Laterality Date  . Spine surgery  2006 & 2010    posterior spinal fusion L4-5 L5S1  . Joint replacement  1992    right hip replacement  . Joint replacement  2002    left hip replacement  . Rotator cuff repair  2002    Left shoulder   Social History History   Social History  . Marital Status: Married    Spouse Name: N/A    Number of Children: N/A  . Years of Education: N/A   Occupational History  . Not on file.   Social History Main Topics  . Smoking status: Never Smoker   . Smokeless tobacco: Never Used  . Alcohol Use: No  . Drug Use: No  . Sexual Activity: Not on file   Other Topics Concern  . Not on file   Social History Narrative   Has living will   Husband is health care POA   Discussed DNR--she requests (done 11/30/12)   No tube feedings if cognitively  unaware   Family History Family History  Problem Relation Age of Onset  . Heart disease Mother   . Heart disease Father     Heart Disease before age 36  . Heart attack Father   . Heart disease Sister   . Diabetes Sister   . Heart disease Brother   . Heart disease Brother   . Cancer Brother     Current Outpatient Prescriptions on File Prior to Visit  Medication Sig Dispense Refill  . aspirin 81 MG tablet Take 81 mg by mouth every other day.      . furosemide (LASIX) 40 MG tablet TAKE 1 TABLET BY MOUTH EVERY DAY  90 tablet  1  . Multiple Vitamin (MULTIVITAMIN) tablet Take 1 tablet by mouth daily.      . nadolol (CORGARD) 40 MG tablet TAKE 1 TABLET BY MOUTH EVERY DAY  90 tablet  2  . zoster vaccine live, PF, (ZOSTAVAX) 82956 UNT/0.65ML injection Inject 19,400 Units into the skin once.  1 each  0  . atorvastatin (LIPITOR) 40 MG tablet TAKE 1 TABLET BY MOUTH DAILY  90 tablet  1   No current facility-administered medications on file prior to visit.   Allergies  Allergen Reactions  . Latex Rash  Is no longer taking Lipitor.  ROS: [x]  Positive   [ ]  Negative   [ ]  All sytems reviewed and are negative  General: [ ]  Weight loss, [ ]  Fever, [ ]  chills Neurologic: [ ]  Dizziness, [ ]  Blackouts, [ ]  Seizure [ ]  Stroke, [ ]  "Mini stroke", [ ]  Slurred speech, [ ]  Temporary blindness; [ ]  weakness in arms or legs, [ ]  Hoarseness Cardiac: [ ]  Chest pain/pressure, [ ]  Shortness of breath at rest [ ]  Shortness of breath with exertion, [ ]  Atrial fibrillation or irregular heartbeat Vascular: [ ]  Pain in legs with walking, [ ]  Pain in legs at rest, [ ]  Pain in legs at night,  [ ]  Non-healing ulcer, [ ]  Blood clot in vein/DVT,   Pulmonary: [ ]  Home oxygen, [ ]  Productive cough, [ ]  Coughing up blood, [ ]  Asthma,  [ ]  Wheezing Musculoskeletal:  Arly.Keller ] Arthritis, Arly.Keller ] Low back pain, [ ]  Joint pain. Positive for history of shoulder surgery, lumbar spine surgery, and bilateral hip  replacements. Hematologic: [ ]  Easy Bruising, [ ]  Anemia; [ ]  Hepatitis Gastrointestinal: [ ]  Blood in stool, Arly.Keller ] Gastroesophageal Reflux/heartburn, [ ]  Trouble swallowing Urinary: [ ]  chronic Kidney disease, [ ]  on HD - [ ]  MWF or [ ]  TTHS, [ ]  Burning with urination, [ ]  Difficulty urinating Skin: [ ]  Rashes, [ ]  Wounds Psychological: [ ]  Anxiety, [ ]  Depression  Physical Examination  Filed Vitals:   12/28/12 1019  BP: 125/76  Pulse: 58  Resp: 14  Height: 5\' 7"  (1.702 m)  Weight: 215 lb (97.523 kg)  SpO2: 95%   Body mass index is 33.67 kg/(m^2). Filed Weights   12/28/12 1019  Weight: 215 lb (97.523 kg)     General: A&O x 3, WD, Obese, walks with a cane.  Pulmonary: Sym exp, good air movt, CTAB, no rales, rhonchi, & wheezing.   Cardiac: RRR, Nl S1, S2, no Murmurs, rubs or gallops.  Carotid Bruits Left Right   Negative Negative                             VASCULAR EXAM:                                                                                                         LE Pulses LEFT RIGHT       FEMORAL  palpable   palpable        POPLITEAL  palpable    palpable       POSTERIOR TIBIAL   palpable    palpable        DORSALIS PEDIS      ANTERIOR TIBIAL  palpable   palpable   AORTA palpable N/A     Gastrointestinal: soft, NTND, -G/R, - HSM, - masses, - CVAT B.  Musculoskeletal: M/S 5/5 throughout, Extremities without ischemic changes.  Neurologic: CN 2-12 intact, Pain and light touch intact in extremities, Motor exam as listed above  Non-Invasive Vascular Imaging  Aorto-iliacDuplex (12/28/2012)  Previous size right  common iliac artery: 1.86 cm (Date: 12/26/2011)  Current size:  1.88 cm (Date: 12/28/2012)  Medical Decision Making  The patient is a 79 y.o. female who presents for scheduled yearly aorto-iliac Duplex of asymptomatic right common iliac artery small aneurysm with no increase in size.   Based on this patient's exam and diagnostic  studies, and after discussing with Dr. Hart Rochester, she will follow up in 1 year with the following studies: aorto-iliac Duplex .  I emphasized the importance of maximal medical management including strict control of blood pressure, blood glucose, and lipid levels, antiplatelet agents, obtaining regular exercise, and continued cessation of smoking.   Thank you for allowing [place] to participate in this patient's care.  Charisse March, RN, MSN, FNP-C [facility] [place] Office: [phone]  Clinic Physician: Early  12/28/2012, 10:32 AM

## 2012-12-28 NOTE — Addendum Note (Signed)
Addended by: Adria Dill L on: 12/28/2012 12:38 PM   Modules accepted: Orders

## 2013-01-12 ENCOUNTER — Encounter: Payer: Medicare Other | Admitting: Internal Medicine

## 2013-01-12 ENCOUNTER — Other Ambulatory Visit: Payer: Self-pay | Admitting: Internal Medicine

## 2013-01-18 ENCOUNTER — Ambulatory Visit: Payer: Medicare Other | Admitting: Internal Medicine

## 2013-01-18 ENCOUNTER — Ambulatory Visit: Payer: Medicare Other

## 2013-01-20 ENCOUNTER — Ambulatory Visit (INDEPENDENT_AMBULATORY_CARE_PROVIDER_SITE_OTHER): Payer: Medicare Other

## 2013-01-20 DIAGNOSIS — Z23 Encounter for immunization: Secondary | ICD-10-CM

## 2013-02-25 ENCOUNTER — Encounter: Payer: Medicare Other | Admitting: Internal Medicine

## 2013-03-14 ENCOUNTER — Other Ambulatory Visit: Payer: Self-pay | Admitting: Internal Medicine

## 2013-04-28 DIAGNOSIS — H6500 Acute serous otitis media, unspecified ear: Secondary | ICD-10-CM | POA: Diagnosis not present

## 2013-04-28 DIAGNOSIS — I119 Hypertensive heart disease without heart failure: Secondary | ICD-10-CM | POA: Diagnosis not present

## 2013-04-28 DIAGNOSIS — J029 Acute pharyngitis, unspecified: Secondary | ICD-10-CM | POA: Diagnosis not present

## 2013-07-12 ENCOUNTER — Other Ambulatory Visit: Payer: Self-pay | Admitting: Internal Medicine

## 2013-09-14 ENCOUNTER — Encounter: Payer: Self-pay | Admitting: Internal Medicine

## 2013-09-14 ENCOUNTER — Ambulatory Visit (INDEPENDENT_AMBULATORY_CARE_PROVIDER_SITE_OTHER): Payer: Medicare Other | Admitting: Internal Medicine

## 2013-09-14 ENCOUNTER — Encounter (INDEPENDENT_AMBULATORY_CARE_PROVIDER_SITE_OTHER): Payer: Self-pay

## 2013-09-14 VITALS — BP 138/80 | HR 54 | Temp 98.5°F | Wt 210.0 lb

## 2013-09-14 DIAGNOSIS — R5381 Other malaise: Secondary | ICD-10-CM

## 2013-09-14 DIAGNOSIS — I723 Aneurysm of iliac artery: Secondary | ICD-10-CM | POA: Diagnosis not present

## 2013-09-14 DIAGNOSIS — R5383 Other fatigue: Secondary | ICD-10-CM

## 2013-09-14 LAB — CBC WITH DIFFERENTIAL/PLATELET
Basophils Absolute: 0 10*3/uL (ref 0.0–0.1)
Basophils Relative: 0.9 % (ref 0.0–3.0)
EOS PCT: 2.2 % (ref 0.0–5.0)
Eosinophils Absolute: 0.1 10*3/uL (ref 0.0–0.7)
HEMATOCRIT: 46.8 % — AB (ref 36.0–46.0)
Hemoglobin: 15.4 g/dL — ABNORMAL HIGH (ref 12.0–15.0)
LYMPHS ABS: 1.5 10*3/uL (ref 0.7–4.0)
LYMPHS PCT: 26.9 % (ref 12.0–46.0)
MCHC: 32.9 g/dL (ref 30.0–36.0)
MCV: 92.8 fl (ref 78.0–100.0)
MONOS PCT: 10.4 % (ref 3.0–12.0)
Monocytes Absolute: 0.6 10*3/uL (ref 0.1–1.0)
NEUTROS PCT: 59.6 % (ref 43.0–77.0)
Neutro Abs: 3.3 10*3/uL (ref 1.4–7.7)
PLATELETS: 190 10*3/uL (ref 150.0–400.0)
RBC: 5.05 Mil/uL (ref 3.87–5.11)
RDW: 14 % (ref 11.5–15.5)
WBC: 5.5 10*3/uL (ref 4.0–10.5)

## 2013-09-14 LAB — TSH: TSH: 0.44 u[IU]/mL (ref 0.35–4.50)

## 2013-09-14 LAB — SEDIMENTATION RATE: SED RATE: 12 mm/h (ref 0–22)

## 2013-09-14 LAB — T4, FREE: Free T4: 0.89 ng/dL (ref 0.60–1.60)

## 2013-09-14 LAB — COMPREHENSIVE METABOLIC PANEL
ALT: 24 U/L (ref 0–35)
AST: 24 U/L (ref 0–37)
Albumin: 4.2 g/dL (ref 3.5–5.2)
Alkaline Phosphatase: 55 U/L (ref 39–117)
BILIRUBIN TOTAL: 0.7 mg/dL (ref 0.2–1.2)
BUN: 18 mg/dL (ref 6–23)
CO2: 30 meq/L (ref 19–32)
CREATININE: 1.1 mg/dL (ref 0.4–1.2)
Calcium: 10.1 mg/dL (ref 8.4–10.5)
Chloride: 104 mEq/L (ref 96–112)
GFR: 49.87 mL/min — AB (ref >60.00)
GLUCOSE: 118 mg/dL — AB (ref 70–99)
Potassium: 3.7 mEq/L (ref 3.5–5.1)
SODIUM: 142 meq/L (ref 135–145)
TOTAL PROTEIN: 7.4 g/dL (ref 6.0–8.3)

## 2013-09-14 NOTE — Progress Notes (Signed)
Subjective:    Patient ID: Sydney [name], female    DOB: [birth year], 80 y.o.   MRN: [id number]  HPI Here with husband  Had "fuzzy" feeling she related to lipitor Went off this and only resolved partially Feels "tired" and "head is fuzzy" No dizziness like pre-syncope---- sleeps a lot and eyes "fuzzy but not blurry"  Sleeps well at night Awakens rested in Am but still doesn't have normal energy Gets drained easy with little activity No DOE or chest pain  Trying to lose some weight Joined weight watchers--lost 5# since last visit Not much exercise--can do "Nu-step" for 30 minutes though  Current Outpatient Prescriptions on File Prior to Visit  Medication Sig Dispense Refill  . aspirin 81 MG tablet Take 81 mg by mouth every other day.      . furosemide (LASIX) 40 MG tablet TAKE 1 TABLET BY MOUTH EVERY DAY  90 tablet  1  . Multiple Vitamin (MULTIVITAMIN) tablet Take 1 tablet by mouth daily.      . nadolol (CORGARD) 40 MG tablet TAKE 1 TABLET BY MOUTH EVERY DAY  90 tablet  2   No current facility-administered medications on file prior to visit.    Allergies  Allergen Reactions  . Lipitor [Atorvastatin]     Dizzy ( fuzziness)  . Latex Rash    Past Medical History  Diagnosis Date  . Hx of lumbosacral spine surgery t  . Hypertension   . Lumbar spinal stenosis   . Iliac artery aneurysm   . Hyperlipidemia   . GERD (gastroesophageal reflux disease)     Past Surgical History  Procedure Laterality Date  . Spine surgery  2006 & 2010    posterior spinal fusion L4-5 L5S1  . Joint replacement  1992    right hip replacement  . Joint replacement  2002    left hip replacement  . Rotator cuff repair  2002    Left shoulder    Family History  Problem Relation Age of Onset  . Heart disease Mother   . Heart disease Father     Heart Disease before age 35  . Heart attack Father   . Heart disease Sister   . Diabetes Sister   . Heart disease Brother   . Heart disease Brother    . Cancer Brother     History   Social History  . Marital Status: Married    Spouse Name: N/A    Number of Children: N/A  . Years of Education: N/A   Occupational History  . Not on file.   Social History Main Topics  . Smoking status: Never Smoker   . Smokeless tobacco: Never Used  . Alcohol Use: No  . Drug Use: No  . Sexual Activity: Not on file   Other Topics Concern  . Not on file   Social History Narrative   Has living will   Husband is health care POA   Discussed DNR--she requests (done 11/30/12)   No tube feedings if cognitively unaware   Review of Systems No edema Sleeps on side-- no PND Nocturia x 0-1    Objective:   Physical Exam  Constitutional: She appears well-developed and well-nourished. No distress.  Neck: Normal range of motion. Neck supple. No thyromegaly present.  Cardiovascular: Normal rate, regular rhythm, normal heart sounds and intact distal pulses.  Exam reveals no gallop.   No murmur heard. Pulmonary/Chest: Effort normal and breath sounds normal. No respiratory distress. She has no wheezes. She  has no rales.  Abdominal: Soft. There is no tenderness.  Musculoskeletal: She exhibits no edema and no tenderness.  Lymphadenopathy:    She has no cervical adenopathy.    She has no axillary adenopathy.       Right: No inguinal adenopathy present.       Left: No inguinal adenopathy present.  Skin: No rash noted.  Psychiatric: She has a normal mood and affect. Her behavior is normal.          Assessment & Plan:

## 2013-09-14 NOTE — Assessment & Plan Note (Signed)
Vague "fuzzy" feeling that is not orthostasis  Not excessive somnolence but does nap easy May just be deconditioning Exam is reassuring---will check blood work Needs to increase exercise  If not improving by next visit, will consider switching nadolol to another BP med (like losartan)

## 2013-09-14 NOTE — Progress Notes (Signed)
Pre visit review using our clinic review tool, if applicable. No additional management support is needed unless otherwise documented below in the visit note. 

## 2013-09-14 NOTE — Assessment & Plan Note (Signed)
If we can get her feeling better, may consider trial with a different statin in the future

## 2013-09-20 ENCOUNTER — Encounter: Payer: Self-pay | Admitting: *Deleted

## 2013-10-05 DIAGNOSIS — D2110 Benign neoplasm of connective and other soft tissue of unspecified upper limb, including shoulder: Secondary | ICD-10-CM | POA: Diagnosis not present

## 2013-10-05 DIAGNOSIS — L819 Disorder of pigmentation, unspecified: Secondary | ICD-10-CM | POA: Diagnosis not present

## 2013-10-05 DIAGNOSIS — L570 Actinic keratosis: Secondary | ICD-10-CM | POA: Diagnosis not present

## 2013-11-12 ENCOUNTER — Other Ambulatory Visit: Payer: Self-pay | Admitting: Internal Medicine

## 2013-11-25 ENCOUNTER — Ambulatory Visit (INDEPENDENT_AMBULATORY_CARE_PROVIDER_SITE_OTHER): Payer: Medicare Other | Admitting: Internal Medicine

## 2013-11-25 ENCOUNTER — Encounter: Payer: Self-pay | Admitting: Internal Medicine

## 2013-11-25 VITALS — BP 110/70 | HR 63 | Temp 98.6°F | Wt 205.0 lb

## 2013-11-25 DIAGNOSIS — K219 Gastro-esophageal reflux disease without esophagitis: Secondary | ICD-10-CM

## 2013-11-25 MED ORDER — OMEPRAZOLE 20 MG PO CPDR: 20.0000 mg | DELAYED_RELEASE_CAPSULE | Freq: Every day | ORAL | Status: DC

## 2013-11-25 NOTE — Progress Notes (Signed)
Pre visit review using our clinic review tool, if applicable. No additional management support is needed unless otherwise documented below in the visit note. 

## 2013-11-25 NOTE — Assessment & Plan Note (Signed)
Nausea, early satiety and occasional swallowing problems probably related to her esophagus Could be mild gastritis again also No real worrisome features now--except slight weight loss  Will try trial of omeprazole GI reeval if not improving

## 2013-11-25 NOTE — Progress Notes (Signed)
Subjective:    Patient ID: Sydney [name], female    DOB: [birth year], 80 y.o.   MRN: [id number]  HPI Here with husband  Still not feeling right The fatigue and "hazy" feeling are gone since stopping lipitor  Now having nausea Gets sick feeling when she sees food Limited eating for some Has lost a few pounds The feeling is intermittent--goes on for a year.  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 aspirin 81 MG tablet Take 81 mg by mouth every other day.      . furosemide (LASIX) 40 MG tablet TAKE 1 TABLET BY MOUTH EVERY DAY  90 tablet  1  . Multiple Vitamin (MULTIVITAMIN) tablet Take 1 tablet by mouth daily.      . nadolol (CORGARD) 40 MG tablet TAKE 1 TABLET BY MOUTH EVERY DAY  90 tablet  0   No current facility-administered medications on file prior to visit.    Allergies  Allergen Reactions  . Lipitor [Atorvastatin]     Dizzy ( fuzziness)  . Latex Rash    Past Medical History  Diagnosis Date  . Hx of lumbosacral spine surgery t  . Hypertension   . Lumbar spinal stenosis   . Iliac artery aneurysm   . Hyperlipidemia   . GERD (gastroesophageal reflux disease)     Past Surgical History  Procedure Laterality Date  . Spine surgery  2006 & 2010    posterior spinal fusion L4-5 L5S1  . Joint replacement  1992    right hip replacement  . Joint replacement  2002    left hip replacement  . Rotator cuff repair  2002    Left shoulder    Family History  Problem Relation Age of Onset  . Heart disease Mother   . Heart disease Father     Heart Disease before age 22  . Heart attack Father   . Heart disease Sister   . Diabetes Sister   . Heart disease Brother   . Heart disease Brother   .  Cancer Brother     History   Social History  . Marital Status: Married    Spouse Name: N/A    Number of Children: N/A  . Years of Education: N/A   Occupational History  . Not on file.   Social History Main Topics  . Smoking status: Never Smoker   . Smokeless tobacco: Never Used  . Alcohol Use: No  . Drug Use: No  . Sexual Activity: Not on file   Other Topics Concern  . Not on file   Social History Narrative   Has living will   Husband is health care POA   Discussed DNR--she requests (done 11/30/12)   No tube feedings if cognitively unaware   Review of Systems 1 cup of coffee per day Sleeps well. No PND. Doesn't awaken with nausea Does note some skipped beats when she checks her pulse at times Bowels have been regular    Objective:   Physical Exam  Constitutional: She appears well-developed and well-nourished. No distress.  Pulmonary/Chest: Effort normal and breath sounds normal. No respiratory distress. She has no wheezes. She has no rales.  Abdominal: Soft. Bowel sounds are normal. She exhibits no distension. There is no  tenderness. There is no rebound and no guarding.          Assessment & Plan:

## 2013-12-02 ENCOUNTER — Ambulatory Visit (INDEPENDENT_AMBULATORY_CARE_PROVIDER_SITE_OTHER): Payer: Medicare Other | Admitting: Internal Medicine

## 2013-12-02 ENCOUNTER — Encounter: Payer: Self-pay | Admitting: Internal Medicine

## 2013-12-02 VITALS — BP 120/80 | HR 57 | Temp 98.3°F | Ht 67.0 in | Wt 206.0 lb

## 2013-12-02 DIAGNOSIS — I714 Abdominal aortic aneurysm, without rupture, unspecified: Secondary | ICD-10-CM | POA: Diagnosis not present

## 2013-12-02 DIAGNOSIS — Z0000 Encounter for general adult medical examination without abnormal findings: Secondary | ICD-10-CM

## 2013-12-02 DIAGNOSIS — I10 Essential (primary) hypertension: Secondary | ICD-10-CM

## 2013-12-02 DIAGNOSIS — K219 Gastro-esophageal reflux disease without esophagitis: Secondary | ICD-10-CM

## 2013-12-02 DIAGNOSIS — Z23 Encounter for immunization: Secondary | ICD-10-CM | POA: Diagnosis not present

## 2013-12-02 DIAGNOSIS — E785 Hyperlipidemia, unspecified: Secondary | ICD-10-CM

## 2013-12-02 DIAGNOSIS — Z7189 Other specified counseling: Secondary | ICD-10-CM

## 2013-12-02 MED ORDER — PRAVASTATIN SODIUM 20 MG PO TABS: 20.0000 mg | ORAL_TABLET | Freq: Every day | ORAL | Status: DC

## 2013-12-02 NOTE — Assessment & Plan Note (Signed)
I have personally reviewed the Medicare Annual Wellness questionnaire and have noted 1. The patient's medical and social history 2. Their use of alcohol, tobacco or illicit drugs 3. Their current medications and supplements 4. The patient's functional ability including ADL's, fall risks, home safety risks and hearing or visual             impairment. 5. Diet and physical activities 6. Evidence for depression or mood disorders  The patients weight, height, BMI and visual acuity have been recorded in the chart I have made referrals, counseling and provided education to the patient based review of the above and I have provided the pt with a written personalized care plan for preventive services.  I have provided you with a copy of your personalized plan for preventive services. Please take the time to review along with your updated medication list.  Discussed fitness No cancer screening due to age Will give prevnar now Flu shot yearly

## 2013-12-02 NOTE — Addendum Note (Signed)
Addended by: Despina Hidden on: 12/02/2013 09:16 AM   Modules accepted: Orders

## 2013-12-02 NOTE — Patient Instructions (Signed)
Please continue the omeprazole daily for the next month. If you continue to do well, you can slowly cut back to every other day. Please start the pravastatin and let me know if you have any problems Set up blood work in about 6 weeks--- lipid, hepatic (272.4), FBS (elevated sugar)  DASH Eating Plan DASH stands for "Dietary Approaches to Stop Hypertension." The DASH eating plan is a healthy eating plan that has been shown to reduce high blood pressure (hypertension). Additional health benefits may include reducing the risk of type 2 diabetes mellitus, heart disease, and stroke. The DASH eating plan may also help with weight loss. WHAT DO I NEED TO KNOW ABOUT THE DASH EATING PLAN? For the DASH eating plan, you will follow these general guidelines:  Choose foods with a percent daily value for sodium of less than 5% (as listed on the food label).  Use salt-free seasonings or herbs instead of table salt or sea salt.  Check with your health care provider or pharmacist before using salt substitutes.  Eat lower-sodium products, often labeled as "lower sodium" or "no salt added."  Eat fresh foods.  Eat more vegetables, fruits, and low-fat dairy products.  Choose whole grains. Look for the word "whole" as the first word in the ingredient list.  Choose fish and skinless chicken or [place] more often than red meat. Limit fish, poultry, and meat to 6 oz (170 g) each day.  Limit sweets, desserts, sugars, and sugary drinks.  Choose heart-healthy fats.  Limit cheese to 1 oz (28 g) per day.  Eat more home-cooked food and less restaurant, buffet, and fast food.  Limit fried foods.  Cook foods using methods other than frying.  Limit canned vegetables. If you do use them, rinse them well to decrease the sodium.  When eating at a restaurant, ask that your food be prepared with less salt, or no salt if possible. WHAT FOODS CAN I EAT? Seek help from a dietitian for individual calorie  needs. Grains Whole grain or whole wheat bread. Brown rice. Whole grain or whole wheat pasta. Quinoa, bulgur, and whole grain cereals. Low-sodium cereals. Corn or whole wheat flour tortillas. Whole grain cornbread. Whole grain crackers. Low-sodium crackers. Vegetables Fresh or frozen vegetables (raw, steamed, roasted, or grilled). Low-sodium or reduced-sodium tomato and vegetable juices. Low-sodium or reduced-sodium tomato sauce and paste. Low-sodium or reduced-sodium canned vegetables.  Fruits All fresh, canned (in natural juice), or frozen fruits. Meat and Other Protein Products Ground beef (85% or leaner), grass-fed beef, or beef trimmed of fat. Skinless chicken or [place]. Ground chicken or [place]. Pork trimmed of fat. All fish and seafood. Eggs. Dried beans, peas, or lentils. Unsalted nuts and seeds. Unsalted canned beans. Dairy Low-fat dairy products, such as skim or 1% milk, 2% or reduced-fat cheeses, low-fat ricotta or cottage cheese, or plain low-fat yogurt. Low-sodium or reduced-sodium cheeses. Fats and Oils Tub margarines without trans fats. Light or reduced-fat mayonnaise and salad dressings (reduced sodium). Avocado. Safflower, olive, or canola oils. Natural peanut or almond butter. Other Unsalted popcorn and pretzels. The items listed above may not be a complete list of recommended foods or beverages. Contact your dietitian for more options. WHAT FOODS ARE NOT RECOMMENDED? Grains White bread. White pasta. White rice. Refined cornbread. Bagels and croissants. Crackers that contain trans fat. Vegetables Creamed or fried vegetables. Vegetables in a cheese sauce. Regular canned vegetables. Regular canned tomato sauce and paste. Regular tomato and vegetable juices. Fruits Dried fruits. Canned fruit in light or  heavy syrup. Fruit juice. Meat and Other Protein Products Fatty cuts of meat. Ribs, chicken wings, bacon, sausage, bologna, salami, chitterlings, fatback, hot dogs, bratwurst,  and packaged luncheon meats. Salted nuts and seeds. Canned beans with salt. Dairy Whole or 2% milk, cream, half-and-half, and cream cheese. Whole-fat or sweetened yogurt. Full-fat cheeses or blue cheese. Nondairy creamers and whipped toppings. Processed cheese, cheese spreads, or cheese curds. Condiments Onion and garlic salt, seasoned salt, table salt, and sea salt. Canned and packaged gravies. Worcestershire sauce. Tartar sauce. Barbecue sauce. Teriyaki sauce. Soy sauce, including reduced sodium. Steak sauce. Fish sauce. Oyster sauce. Cocktail sauce. Horseradish. Ketchup and mustard. Meat flavorings and tenderizers. Bouillon cubes. Hot sauce. Tabasco sauce. Marinades. Taco seasonings. Relishes. Fats and Oils Butter, stick margarine, lard, shortening, ghee, and bacon fat. Coconut, palm kernel, or palm oils. Regular salad dressings. Other Pickles and olives. Salted popcorn and pretzels. The items listed above may not be a complete list of foods and beverages to avoid. Contact your dietitian for more information. WHERE CAN I FIND MORE INFORMATION? National Heart, Lung, and Blood Institute: travelstabloid.com Document Released: 03/20/2011 Document Revised: 08/15/2013 Document Reviewed: 02/02/2013 [facility] Patient Information 2015 [place], [place]. This information is not intended to replace advice given to you by your health care provider. Make sure you discuss any questions you have with your health care provider.

## 2013-12-02 NOTE — Assessment & Plan Note (Signed)
BP Readings from Last 3 Encounters:  12/02/13 120/80  11/25/13 110/70  09/14/13 138/80   Good control

## 2013-12-02 NOTE — Progress Notes (Signed)
Pre visit review using our clinic review tool, if applicable. No additional management support is needed unless otherwise documented below in the visit note. 

## 2013-12-02 NOTE — Assessment & Plan Note (Signed)
Due for yearly follow up soon On beta blocker and aspirin Will try another statin

## 2013-12-02 NOTE — Assessment & Plan Note (Signed)
See social history 

## 2013-12-02 NOTE — Assessment & Plan Note (Signed)
Will try low dose pravastatin

## 2013-12-02 NOTE — Progress Notes (Signed)
Subjective:    Patient ID: Sydney [name], female    DOB: [birth year], 80 y.o.   MRN: [id number]  HPI Here for Medicare wellness and follow up Reviewed advanced directives Sees Dr Ellene Route for her back--not recently since doing well. Dentist- Dr Olena Heckle. Dr Starling Manns left---will be reassigned for her eyes No alcohol or tobacco Exercises regularly Walks with cane Independent with all ADLs No cognitive changes Vision and hearing fine Done with cancer screening Reviewed immunizations  No falls No depression or anhedonia  Feels better Stomach is better Appetite is back to normal Gained 1# back Bowels are normal  Discussed her aneurysms and cholesterol I would like her to try another statin Will be in town till October Gets yearly ultrasounds to monitor them  No chest pain No palpitations No dizziness or sycnope No edema Exercises with nu-step 30 minutes twice a day most days  Current Outpatient Prescriptions on File Prior to Visit  Medication Sig Dispense Refill  . aspirin 81 MG tablet Take 81 mg by mouth every other day.      . furosemide (LASIX) 40 MG tablet TAKE 1 TABLET BY MOUTH EVERY DAY  90 tablet  1  . Multiple Vitamin (MULTIVITAMIN) tablet Take 1 tablet by mouth daily.      . nadolol (CORGARD) 40 MG tablet TAKE 1 TABLET BY MOUTH EVERY DAY  90 tablet  0  . omeprazole (PRILOSEC) 20 MG capsule Take 1 capsule (20 mg total) by mouth daily.  30 capsule  11   No current facility-administered medications on file prior to visit.    Allergies  Allergen Reactions  . Lipitor [Atorvastatin]     Dizzy ( fuzziness)  . Latex Rash    Past Medical History  Diagnosis Date  . Hx of lumbosacral spine surgery t  . Hypertension   . Lumbar spinal stenosis   . Iliac artery aneurysm   . Hyperlipidemia   . GERD (gastroesophageal reflux disease)     Past Surgical History  Procedure Laterality Date  . Spine surgery  2006 & 2010    posterior spinal fusion L4-5 L5S1  . Joint  replacement  1992    right hip replacement  . Joint replacement  2002    left hip replacement  . Rotator cuff repair  2002    Left shoulder    Family History  Problem Relation Age of Onset  . Heart disease Mother   . Heart disease Father     Heart Disease before age 60  . Heart attack Father   . Heart disease Sister   . Diabetes Sister   . Heart disease Brother   . Heart disease Brother   . Cancer Brother     History   Social History  . Marital Status: Married    Spouse Name: N/A    Number of Children: N/A  . Years of Education: N/A   Occupational History  . Not on file.   Social History Main Topics  . Smoking status: Never Smoker   . Smokeless tobacco: Never Used  . Alcohol Use: No  . Drug Use: No  . Sexual Activity: Not on file   Other Topics Concern  . Not on file   Social History Narrative   Has living will   Husband is health care POA   Discussed DNR--she requests (done 11/30/12)   No tube feedings if cognitively unaware   Review of Systems Sleeps well Bowels are fine Void well. No incontinence No skin  rash or suspicious lesions. Had one spot removed at derm-- was benign Keeps up with dentist No sexual problems    Objective:   Physical Exam  Constitutional: She is oriented to person, place, and time. She appears well-developed and well-nourished. No distress.  HENT:  Mouth/Throat: Oropharynx is clear and moist. No oropharyngeal exudate.  Neck: Normal range of motion. Neck supple. No thyromegaly present.  Cardiovascular: Normal rate, regular rhythm, normal heart sounds and intact distal pulses.  Exam reveals no gallop.   No murmur heard. Pulmonary/Chest: Effort normal and breath sounds normal. No respiratory distress. She has no wheezes. She has no rales.  Abdominal: Soft. There is no tenderness.  Musculoskeletal: She exhibits no edema and no tenderness.  Lymphadenopathy:    She has no cervical adenopathy.  Neurological: She is alert and  oriented to person, place, and time.  President --"Elyn Peers, Maudie Flakes, [place]" [phone] [id number] Recall---remembered words from last year!!!  Psychiatric: She has a normal mood and affect. Her behavior is normal.          Assessment & Plan:

## 2013-12-02 NOTE — Assessment & Plan Note (Signed)
Symptoms resolved on the PPI Will have her take it daily for another month then can cut back to every other day

## 2013-12-03 ENCOUNTER — Encounter: Payer: Self-pay | Admitting: Internal Medicine

## 2013-12-03 ENCOUNTER — Encounter: Payer: Self-pay | Admitting: Gastroenterology

## 2013-12-05 ENCOUNTER — Telehealth: Payer: Self-pay | Admitting: Internal Medicine

## 2013-12-05 NOTE — Telephone Encounter (Signed)
Relevant patient education assigned to patient using Emmi. ° °

## 2013-12-14 ENCOUNTER — Other Ambulatory Visit: Payer: Self-pay | Admitting: *Deleted

## 2013-12-14 MED ORDER — OMEPRAZOLE 20 MG PO CPDR: 20.0000 mg | DELAYED_RELEASE_CAPSULE | Freq: Every day | ORAL | Status: DC

## 2014-01-06 ENCOUNTER — Other Ambulatory Visit: Payer: Self-pay | Admitting: Internal Medicine

## 2014-01-06 DIAGNOSIS — E785 Hyperlipidemia, unspecified: Secondary | ICD-10-CM

## 2014-01-09 ENCOUNTER — Encounter: Payer: Self-pay | Admitting: Family

## 2014-01-10 ENCOUNTER — Encounter: Payer: Self-pay | Admitting: Family

## 2014-01-10 ENCOUNTER — Ambulatory Visit (HOSPITAL_COMMUNITY)
Admission: RE | Admit: 2014-01-10 | Discharge: 2014-01-10 | Disposition: A | Discharge status: Home / Self Care | Payer: Medicare Other | Source: Ambulatory Visit | Attending: Family | Admitting: Family

## 2014-01-10 ENCOUNTER — Ambulatory Visit (INDEPENDENT_AMBULATORY_CARE_PROVIDER_SITE_OTHER): Payer: Medicare Other | Admitting: Family

## 2014-01-10 VITALS — BP 130/60 | HR 56 | Resp 14 | Ht 67.0 in | Wt 204.0 lb

## 2014-01-10 DIAGNOSIS — I723 Aneurysm of iliac artery: Secondary | ICD-10-CM

## 2014-01-10 NOTE — Progress Notes (Signed)
[facility] [place]  Established Iliac Artery Aneurysm  History of Present Illness  Sydney [name] is a 80 y.o. ([birth year]) female patient of Dr. Donnetta Hutching who presents today for followup of her incidentally found iliac artery aneurysm. She underwent CT scan for degenerative disc disease and was found to have a 2 cm right common iliac artery aneurysm.  Her risk factors for aneurysmal growth are not the following: is not diabetic, does not use tobacco, blood pressure is well controlled, is not sedentary, takes a 81 mg ASA qod . A possible risk factor for her is dyslipidemia as she had to be taken off the Lipitor that she was on for years due to the "fuzzy feeling" she got from this but her PCP is closely monitoring her cholesterol, and the patient states that her last total cholesterol was 200.  Patient denies abdominal or new back pain.   She does have a history of lumbar surgery and chronic back problems with standing for long periods. Pt smoker: non-smoker, never smoked. The patient denies claudication in legs with walking. The patient denies history of stroke or TIA symptoms.  Pt Diabetic: No Pt smoker: non-smoker  Past Medical History  Diagnosis Date  . Hx of lumbosacral spine surgery t  . Hypertension   . Lumbar spinal stenosis   . Iliac artery aneurysm   . Hyperlipidemia   . GERD (gastroesophageal reflux disease)    Past Surgical History  Procedure Laterality Date  . Spine surgery  2006 & 2010    posterior spinal fusion L4-5 L5S1  . Joint replacement  1992    right hip replacement  . Joint replacement  2002    left hip replacement  . Rotator cuff repair  2002    Left shoulder   Social History History   Social History  . Marital Status: Married    Spouse Name: N/A    Number of Children: N/A  . Years of Education: N/A   Occupational History  . Not on file.   Social History Main Topics  . Smoking status: Never Smoker   . Smokeless tobacco: Never  Used  . Alcohol Use: No  . Drug Use: No  . Sexual Activity: Not on file   Other Topics Concern  . Not on file   Social History Narrative   Has living will   Husband is health care POA   Discussed DNR--she requests (done 11/30/12)   No tube feedings if cognitively unaware   Family History Family History  Problem Relation Age of Onset  . Heart disease Mother   . Heart disease Father     Heart Disease before age 54  . Heart attack Father   . Heart disease Sister   . Diabetes Sister   . Heart disease Brother   . Heart disease Brother   . Cancer Brother     Current Outpatient Prescriptions on File Prior to Visit  Medication Sig Dispense Refill  . aspirin 81 MG tablet Take 81 mg by mouth every other day.      . furosemide (LASIX) 40 MG tablet TAKE 1 TABLET BY MOUTH EVERY DAY  90 tablet  1  . Multiple Vitamin (MULTIVITAMIN) tablet Take 1 tablet by mouth daily.      . nadolol (CORGARD) 40 MG tablet TAKE 1 TABLET BY MOUTH EVERY DAY  90 tablet  0  . omeprazole (PRILOSEC) 20 MG capsule Take 1 capsule (20 mg total) by mouth daily.  90 capsule  3  . pravastatin (PRAVACHOL) 20 MG tablet Take 1 tablet (20 mg total) by mouth daily.  90 tablet  3   No current facility-administered medications on file prior to visit.   Allergies  Allergen Reactions  . Lipitor [Atorvastatin]     Dizzy ( fuzziness)  . Latex Rash    ROS: See HPI for pertinent positives and negatives.  Physical Examination  Filed Vitals:   01/10/14 1029  BP: 130/60  Pulse: 56  Resp: 14  Height: 5\' 7"  (1.702 m)  Weight: 204 lb (92.534 kg)  SpO2: 99%   Body mass index is 31.94 kg/(m^2).  General: A&O x 3, WD, Obese female, walks with a cane.  Pulmonary: Sym exp, good air movt, CTAB, no rales, rhonchi, & wheezing.  Cardiac: RRR, Nl S1, S2, no Murmur detected.   Carotid Bruits  Left  Right    Negative  Negative   VASCULAR EXAM:  LE Pulses  LEFT  RIGHT   FEMORAL  2+palpable  Not palpable   POPLITEAL   2+palpable  2+palpable   POSTERIOR TIBIAL  2+palpable  2+palpable   DORSALIS PEDIS  ANTERIOR TIBIAL  2+palpable  2+palpable   AORTA  Not palpable  N/A    Gastrointestinal: soft, NTND, -G/R, - HSM, - masses palpated, - CVAT B.  Musculoskeletal: M/S 5/5 throughout, Extremities without ischemic changes, no peripheral edema.  Neurologic: CN 2-12 intact, Pain and light touch intact in extremities, Motor exam as listed above  Non-Invasive Vascular Imaging  Aortoiliac Duplex (01/10/2014) ABDOMINAL AORTA DUPLEX EVALUATION    INDICATION: Follow up right common iliac artery aneurysm    PREVIOUS INTERVENTION(S): None    DUPLEX EXAM:     LOCATION DIAMETER AP (cm) DIAMETER TRANSVERSE (cm) VELOCITIES (cm/sec)  Aorta Proximal 1.7 1.6 77  Aorta Mid 2.0 1.8 68  Aorta Distal 1.9 1.5 82  Right Common Iliac Artery 2.0 1.6 111  Left Common Iliac Artery 1.3 1.0 88    Previous max aortic diameter:  Right CIA 1.78 x 1.88 Date: 12/28/2012     ADDITIONAL FINDINGS: Plaque noted within right common iliac artery aneurysm with no increase of velocity.    IMPRESSION: 1. Aneurysmal proximal right common iliac artery aneurysm measuring 2.0 x 1.6 cms. 2. No evidence of abdominal aortic aneurysm.    Compared to the previous exam:  Slight increase in size of right common iliac artery aneurysm     Medical Decision Making  The patient is a 80 y.o. female who presents with asymptomatic iliac artery aneurysm with slight increase in size.   Based on this patient's exam and diagnostic studies, the patient will follow up in 1 year with the following studies: aortoiliac Duplex.  I emphasized the importance of maximal medical management including strict control of blood pressure, blood glucose, and lipid levels, antiplatelet agents, obtaining regular exercise, and continued cessation of smoking.   The patient was advised to call 911 should the patient experience sudden onset abdominal or back pain.   Thank you  for allowing [place] to participate in this patient's care.  Clemon Chambers, RN, MSN, FNP-C [facility] Office: [phone]  Clinic Physician: Early  01/10/2014, 10:32 AM

## 2014-01-10 NOTE — Addendum Note (Signed)
Addended by: Dorthula Rue L on: 01/10/2014 05:11 PM   Modules accepted: Orders

## 2014-01-13 ENCOUNTER — Other Ambulatory Visit: Payer: Self-pay | Admitting: Internal Medicine

## 2014-01-13 ENCOUNTER — Ambulatory Visit (INDEPENDENT_AMBULATORY_CARE_PROVIDER_SITE_OTHER): Payer: Medicare Other

## 2014-01-13 ENCOUNTER — Other Ambulatory Visit: Payer: Medicare Other

## 2014-01-13 ENCOUNTER — Other Ambulatory Visit (INDEPENDENT_AMBULATORY_CARE_PROVIDER_SITE_OTHER): Payer: Medicare Other

## 2014-01-13 DIAGNOSIS — E785 Hyperlipidemia, unspecified: Secondary | ICD-10-CM

## 2014-01-13 DIAGNOSIS — Z23 Encounter for immunization: Secondary | ICD-10-CM | POA: Diagnosis not present

## 2014-01-13 DIAGNOSIS — Z0000 Encounter for general adult medical examination without abnormal findings: Secondary | ICD-10-CM | POA: Diagnosis not present

## 2014-01-13 DIAGNOSIS — I10 Essential (primary) hypertension: Secondary | ICD-10-CM | POA: Diagnosis not present

## 2014-01-13 DIAGNOSIS — R7309 Other abnormal glucose: Secondary | ICD-10-CM | POA: Diagnosis not present

## 2014-01-13 LAB — CBC WITH DIFFERENTIAL/PLATELET
Basophils Absolute: 0.1 10*3/uL (ref 0.0–0.1)
Basophils Relative: 1 % (ref 0–1)
Eosinophils Absolute: 0.3 10*3/uL (ref 0.0–0.7)
Eosinophils Relative: 4 % (ref 0–5)
HCT: 42.9 % (ref 36.0–46.0)
HEMOGLOBIN: 14.6 g/dL (ref 12.0–15.0)
LYMPHS ABS: 1.8 10*3/uL (ref 0.7–4.0)
LYMPHS PCT: 25 % (ref 12–46)
MCH: 30.7 pg (ref 26.0–34.0)
MCHC: 34 g/dL (ref 30.0–36.0)
MCV: 90.3 fL (ref 78.0–100.0)
MONO ABS: 0.9 10*3/uL (ref 0.1–1.0)
Monocytes Relative: 12 % (ref 3–12)
Neutro Abs: 4.1 10*3/uL (ref 1.7–7.7)
Neutrophils Relative %: 58 % (ref 43–77)
Platelets: 219 10*3/uL (ref 150–400)
RBC: 4.75 MIL/uL (ref 3.87–5.11)
RDW: 13.6 % (ref 11.5–15.5)
WBC: 7.1 10*3/uL (ref 4.0–10.5)

## 2014-01-13 LAB — T4, FREE: Free T4: 1.22 ng/dL (ref 0.80–1.80)

## 2014-01-13 NOTE — Addendum Note (Signed)
Addended by: Ellamae Sia on: 01/13/2014 04:39 PM   Modules accepted: Orders

## 2014-01-14 ENCOUNTER — Telehealth: Payer: Self-pay | Admitting: Internal Medicine

## 2014-01-14 LAB — COMPREHENSIVE METABOLIC PANEL WITH GFR
ALT: 15 U/L (ref 0–35)
AST: 16 U/L (ref 0–37)
Albumin: 3.9 g/dL (ref 3.5–5.2)
Alkaline Phosphatase: 52 U/L (ref 39–117)
BUN: 23 mg/dL (ref 6–23)
CO2: 30 meq/L (ref 19–32)
Calcium: 10 mg/dL (ref 8.4–10.5)
Chloride: 105 meq/L (ref 96–112)
Creat: 1.13 mg/dL — ABNORMAL HIGH (ref 0.50–1.10)
Glucose, Bld: 97 mg/dL (ref 70–99)
Potassium: 4.1 meq/L (ref 3.5–5.3)
Sodium: 144 meq/L (ref 135–145)
Total Bilirubin: 0.3 mg/dL (ref 0.2–1.2)
Total Protein: 6.5 g/dL (ref 6.0–8.3)

## 2014-01-14 LAB — HEMOGLOBIN A1C
Hgb A1c MFr Bld: 6.1 % — ABNORMAL HIGH
Mean Plasma Glucose: 128 mg/dL — ABNORMAL HIGH

## 2014-01-14 NOTE — Telephone Encounter (Signed)
Please send her a copy of her labs The blood sugar is still mildly up--but nothing worrisome. The liver tests are normal. Unfortunately, the cholesterol didn't get run---but if she is not having problems with the cholesterol med, please continue it Everything else was normal so very reassuring results in general

## 2014-01-16 LAB — LIPID PANEL
CHOL/HDL RATIO: 4.4 ratio
CHOLESTEROL: 248 mg/dL — AB (ref 0–200)
HDL: 57 mg/dL (ref >39)
LDL Cholesterol: 136 mg/dL — ABNORMAL HIGH (ref 0–99)
TRIGLYCERIDES: 273 mg/dL — AB (ref <150)
VLDL: 55 mg/dL — AB (ref 0–40)

## 2014-01-16 NOTE — Telephone Encounter (Signed)
Lab results and Dr. Silvio Pate comments mailed to patient.

## 2014-01-18 ENCOUNTER — Encounter: Payer: Self-pay | Admitting: *Deleted

## 2014-01-18 MED ORDER — PRAVASTATIN SODIUM 40 MG PO TABS: 40.0000 mg | ORAL_TABLET | Freq: Every day | ORAL | Status: DC

## 2014-01-18 NOTE — Addendum Note (Signed)
Addended by: Daralene Milch C on: 01/18/2014 11:37 AM   Modules accepted: Orders, Medications

## 2014-02-02 ENCOUNTER — Other Ambulatory Visit: Payer: Self-pay | Admitting: Internal Medicine

## 2014-02-20 ENCOUNTER — Telehealth: Payer: Self-pay

## 2014-02-20 NOTE — Telephone Encounter (Signed)
Christine with [facility] left v/m requesting cb about prior auth for omeprazole 20 mg caps that was sent last week; takes med once daily; PA said plan limitations exceeded. Christine request cb.

## 2014-02-21 NOTE — Telephone Encounter (Signed)
Only takes it daily and I had discussed maybe cutting back to every other day. Don't know what "quantity limits" they are mentioning when just daily Probably need to try prior auth

## 2014-02-21 NOTE — Telephone Encounter (Signed)
Would pt need to buy over the counter? Or try and proceed with quantity limits form?

## 2014-02-22 NOTE — Telephone Encounter (Signed)
Tried calling patient and number has been disconnected at the customers request.

## 2014-02-22 NOTE — Telephone Encounter (Signed)
Sent prior auth to Cover my meds/ [employer]

## 2014-03-28 ENCOUNTER — Other Ambulatory Visit: Payer: Self-pay | Admitting: Internal Medicine

## 2014-04-26 DIAGNOSIS — Z6834 Body mass index (BMI) 34.0-34.9, adult: Secondary | ICD-10-CM | POA: Diagnosis not present

## 2014-04-26 DIAGNOSIS — J209 Acute bronchitis, unspecified: Secondary | ICD-10-CM | POA: Diagnosis not present

## 2014-05-24 DIAGNOSIS — M60811 Other myositis, right shoulder: Secondary | ICD-10-CM | POA: Diagnosis not present

## 2014-07-04 ENCOUNTER — Other Ambulatory Visit: Payer: Self-pay | Admitting: Internal Medicine

## 2014-07-13 ENCOUNTER — Telehealth: Payer: Self-pay

## 2014-07-13 NOTE — Telephone Encounter (Signed)
Pt has appt scheduled on 07/18/13 at 10:15 AM.Please advise.

## 2014-07-13 NOTE — Telephone Encounter (Signed)
PLEASE NOTE: All timestamps contained within this report are represented as [place] Standard Time. CONFIDENTIALTY NOTICE: This fax transmission is intended only for the addressee. It contains information that is legally privileged, confidential or otherwise protected from use or disclosure. If you are not the intended recipient, you are strictly prohibited from reviewing, disclosing, copying using or disseminating any of this information or taking any action in reliance on or regarding this information. If you have received this fax in error, please notify us immediately by telephone so that we can arrange for its return to [place]. Phone: [phone], Toll-Free: [phone], Fax: [phone] Page: 1 of 1 Call Id: [id number] Golf Patient Name: Sydney [name] Gender: Female DOB: Mar 18, 1933 Age: 81 Y 11 M 1 D Return Phone Number: [phone] (Primary) Address: City/State/Zip: [place] Client [facility] - Client Client Site [facility] [place], [place] Type Call Caller Name [place] Phone Number [phone] Relationship To Patient Self Is this call to report lab results? No Call Type General Information Initial Comment Caller states having pain in right side, needing to make apt asap, connected her with Melissa in office General Information Type Appointment Nurse Assessment Guidelines Guideline Title Affirmed Question Affirmed Notes Nurse Date/Time (Eastern Time) Disp. Time Eilene Ghazi Time) Disposition Final User 07/13/2014 4:17:02 PM General Information Provided Yes Fransico Michael After Care Instructions Given Call Event Type User Date / Time Description

## 2014-07-13 NOTE — Telephone Encounter (Signed)
Please call her and offer appt 4/1 in a same day slot if she needs to be seen sooner

## 2014-07-13 NOTE — Telephone Encounter (Signed)
Appointment scheduled 07/14/14 at 10:15, Dr. Silvio Pate.  Patient advised.

## 2014-07-14 ENCOUNTER — Encounter: Payer: Self-pay | Admitting: Internal Medicine

## 2014-07-14 ENCOUNTER — Ambulatory Visit (INDEPENDENT_AMBULATORY_CARE_PROVIDER_SITE_OTHER): Payer: Medicare Other | Admitting: Internal Medicine

## 2014-07-14 VITALS — BP 140/80 | HR 62 | Temp 98.1°F | Wt 203.0 lb

## 2014-07-14 DIAGNOSIS — R1011 Right upper quadrant pain: Secondary | ICD-10-CM | POA: Diagnosis not present

## 2014-07-14 DIAGNOSIS — I723 Aneurysm of iliac artery: Secondary | ICD-10-CM

## 2014-07-14 DIAGNOSIS — M7541 Impingement syndrome of right shoulder: Secondary | ICD-10-CM

## 2014-07-14 DIAGNOSIS — M159 Polyosteoarthritis, unspecified: Secondary | ICD-10-CM | POA: Insufficient documentation

## 2014-07-14 NOTE — Assessment & Plan Note (Signed)
Not a classic story but does have most problems in afternoon after largest meal Slightly positive Murphy's sign Will check ultrasound If positive--- CCS

## 2014-07-14 NOTE — Progress Notes (Signed)
Pre visit review using our clinic review tool, if applicable. No additional management support is needed unless otherwise documented below in the visit note. 

## 2014-07-14 NOTE — Assessment & Plan Note (Signed)
Fairly mild now May have partial rotator cuff injury but no major tear by exam Consider PT if worsens

## 2014-07-14 NOTE — Assessment & Plan Note (Signed)
Gets yearly follow up with vascular

## 2014-07-14 NOTE — Progress Notes (Signed)
Subjective:    Patient ID: Sydney [name], female    DOB: 1932-07-22, 81 y.o.   MRN: [id number]  HPI Here for evaluation of abdominal pain  Just coming back from [place] Has had this for a while but hoping it would go away Different from the pain she had last fall Gets stabbing pain  Feels sick in afternoon-- nausea like No problems in AM--just has some sense of it Has to lie on left side in bed--bothers her to lie on right At times she can't eat--- "the food just disgusts me" No vomiting No diarrhea  Also having problems with her right arm Needs to lift it with her left arm No falls or injury Did have rotator cuff surgery on left in past  Current Outpatient Prescriptions on File Prior to Visit  Medication Sig Dispense Refill  . aspirin 81 MG tablet Take 81 mg by mouth every other day.    . furosemide (LASIX) 40 MG tablet TAKE 1 TABLET BY MOUTH EVERY DAY 90 tablet 1  . Multiple Vitamin (MULTIVITAMIN) tablet Take 1 tablet by mouth daily.    . nadolol (CORGARD) 40 MG tablet TAKE 1 TABLET BY MOUTH EVERY DAY 90 tablet 0  . omeprazole (PRILOSEC) 20 MG capsule Take 1 capsule (20 mg total) by mouth daily. 90 capsule 3   No current facility-administered medications on file prior to visit.    Allergies  Allergen Reactions  . Lipitor [Atorvastatin]     Dizzy ( fuzziness)  . Latex Rash    Past Medical History  Diagnosis Date  . Hx of lumbosacral spine surgery t  . Hypertension   . Lumbar spinal stenosis   . Iliac artery aneurysm   . Hyperlipidemia   . GERD (gastroesophageal reflux disease)     Past Surgical History  Procedure Laterality Date  . Spine surgery  2006 & 2010    posterior spinal fusion L4-5 L5S1  . Joint replacement  1992    right hip replacement  . Joint replacement  2002    left hip replacement  . Rotator cuff repair  2002    Left shoulder    Family History  Problem Relation Age of Onset  . Heart disease Mother   . Heart disease Father     Heart  Disease before age 53  . Heart attack Father   . Heart disease Sister   . Diabetes Sister   . Heart disease Brother   . Heart disease Brother   . Cancer Brother     History   Social History  . Marital Status: Married    Spouse Name: N/A  . Number of Children: N/A  . Years of Education: N/A   Occupational History  . Not on file.   Social History Main Topics  . Smoking status: Never Smoker   . Smokeless tobacco: Never Used  . Alcohol Use: No  . Drug Use: No  . Sexual Activity: Not on file   Other Topics Concern  . Not on file   Social History Narrative   Has living will   Husband is health care POA   Discussed DNR--she requests (done 11/30/12)   No tube feedings if cognitively unaware   Review of Systems No fever No weight loss No cough No SOB     Objective:   Physical Exam  Constitutional: She appears well-developed and well-nourished. No distress.  Neck: Normal range of motion. Neck supple. No thyromegaly present.  Cardiovascular: Normal rate, regular rhythm  and normal heart sounds.  Exam reveals no gallop.   No murmur heard. Pulmonary/Chest: Effort normal and breath sounds normal. No respiratory distress. She has no wheezes. She has no rales.  Abdominal: Soft. Bowel sounds are normal. She exhibits no distension and no mass. There is no rebound and no guarding.  Mild RUQ tenderness and slightly positive Murphy's sign very lateral under right ribs  Musculoskeletal:  No right shoulder tenderness Active abduction to about 60-70 degrees Mild restriction of internal and external rotation  Lymphadenopathy:    She has no cervical adenopathy.          Assessment & Plan:

## 2014-07-19 ENCOUNTER — Ambulatory Visit: Payer: BC Managed Care – PPO | Admitting: Internal Medicine

## 2014-07-20 ENCOUNTER — Ambulatory Visit
Admission: RE | Admit: 2014-07-20 | Discharge: 2014-07-20 | Disposition: A | Discharge status: Home / Self Care | Payer: Medicare Other | Source: Ambulatory Visit | Attending: Internal Medicine | Admitting: Internal Medicine

## 2014-07-20 DIAGNOSIS — K760 Fatty (change of) liver, not elsewhere classified: Secondary | ICD-10-CM | POA: Diagnosis not present

## 2014-07-20 DIAGNOSIS — R1011 Right upper quadrant pain: Secondary | ICD-10-CM

## 2014-07-24 ENCOUNTER — Telehealth: Payer: Self-pay | Admitting: Internal Medicine

## 2014-07-24 NOTE — Telephone Encounter (Signed)
See lab note.  

## 2014-07-24 NOTE — Telephone Encounter (Signed)
Pt called checking on the results of the ultrasound she had last week

## 2014-08-29 ENCOUNTER — Ambulatory Visit (INDEPENDENT_AMBULATORY_CARE_PROVIDER_SITE_OTHER): Payer: Medicare Other | Admitting: Internal Medicine

## 2014-08-29 ENCOUNTER — Encounter: Payer: Self-pay | Admitting: Internal Medicine

## 2014-08-29 VITALS — BP 120/80 | HR 65 | Temp 98.0°F | Wt 201.0 lb

## 2014-08-29 DIAGNOSIS — R1011 Right upper quadrant pain: Secondary | ICD-10-CM | POA: Diagnosis not present

## 2014-08-29 MED ORDER — PANTOPRAZOLE SODIUM 40 MG PO TBEC: 40.0000 mg | DELAYED_RELEASE_TABLET | Freq: Two times a day (BID) | ORAL | Status: DC

## 2014-08-29 NOTE — Progress Notes (Signed)
Subjective:    Patient ID: Sydney [name], female    DOB: [birth year], 81 y.o.   MRN: [id number]  HPI Here for ongoing abdominal pain  Really bad day yesterday Gets burning sensation up chest and gets sour taste in mouth Thinks she is taking the omeprazole--taking on empty stomach No swallowing problems Appetite is okay except when nausea hits  Pain worst in afternoon till bedtime Buttermilk will help some No bedtime snack in general Sleeps flat  1 cup of coffee in AM No mints or gum  Current Outpatient Prescriptions on File Prior to Visit  Medication Sig Dispense Refill  . aspirin 81 MG tablet Take 81 mg by mouth every other day.    . furosemide (LASIX) 40 MG tablet TAKE 1 TABLET BY MOUTH EVERY DAY 90 tablet 1  . Multiple Vitamin (MULTIVITAMIN) tablet Take 1 tablet by mouth daily.    . nadolol (CORGARD) 40 MG tablet TAKE 1 TABLET BY MOUTH EVERY DAY 90 tablet 0  . omeprazole (PRILOSEC) 20 MG capsule Take 1 capsule (20 mg total) by mouth daily. 90 capsule 3   No current facility-administered medications on file prior to visit.    Allergies  Allergen Reactions  . Lipitor [Atorvastatin]     Dizzy ( fuzziness)  . Latex Rash    Past Medical History  Diagnosis Date  . Hx of lumbosacral spine surgery t  . Hypertension   . Lumbar spinal stenosis   . Iliac artery aneurysm   . Hyperlipidemia   . GERD (gastroesophageal reflux disease)     Past Surgical History  Procedure Laterality Date  . Spine surgery  2006 & 2010    posterior spinal fusion L4-5 L5S1  . Joint replacement  1992    right hip replacement  . Joint replacement  2002    left hip replacement  . Rotator cuff repair  2002    Left shoulder    Family History  Problem Relation Age of Onset  . Heart disease Mother   . Heart disease Father     Heart Disease before age 50  . Heart attack Father   . Heart disease Sister   . Diabetes Sister   . Heart disease Brother   . Heart disease Brother   . Cancer  Brother     History   Social History  . Marital Status: Married    Spouse Name: N/A  . Number of Children: N/A  . Years of Education: N/A   Occupational History  . Not on file.   Social History Main Topics  . Smoking status: Never Smoker   . Smokeless tobacco: Never Used  . Alcohol Use: No  . Drug Use: No  . Sexual Activity: Not on file   Other Topics Concern  . Not on file   Social History Narrative   Has living will   Husband is health care POA   Discussed DNR--she requests (done 11/30/12)   No tube feedings if cognitively unaware   Review of Systems Weight is down 3# since last visit--trying Bowels are regular No longer has the right flank pain    Objective:   Physical Exam  Constitutional: She appears well-developed and well-nourished. No distress.  Neck: Normal range of motion. Neck supple. No thyromegaly present.  Cardiovascular: Normal rate, regular rhythm and normal heart sounds.  Exam reveals no gallop and no friction rub.   No murmur heard. Pulmonary/Chest: Effort normal and breath sounds normal. No respiratory distress. She has no  wheezes. She has no rales. She exhibits no tenderness.  Abdominal: Soft. She exhibits no distension. There is no rebound and no guarding.  Very slight epigastric tenderness  Lymphadenopathy:    She has no cervical adenopathy.          Assessment & Plan:

## 2014-08-29 NOTE — Patient Instructions (Signed)
Please stop the omeprazole and change to the pantoprazole twice a day on an empty stomach. If your symptoms go away, continue the twice a day for at least 4-6 weeks--then you can try to decrease to just once a day. If you are not better in the next few weeks, let me know and I will set you up with a GI doctor.

## 2014-08-29 NOTE — Assessment & Plan Note (Signed)
This seems better but now with more classic reflux symptoms (despite omeprazole) Will change to bid pantoprazole GI eval if symptoms persist

## 2014-08-29 NOTE — Progress Notes (Signed)
Pre visit review using our clinic review tool, if applicable. No additional management support is needed unless otherwise documented below in the visit note. 

## 2014-08-30 ENCOUNTER — Ambulatory Visit: Payer: BC Managed Care – PPO | Admitting: Internal Medicine

## 2014-09-27 ENCOUNTER — Other Ambulatory Visit: Payer: Self-pay | Admitting: Internal Medicine

## 2014-10-25 ENCOUNTER — Other Ambulatory Visit: Payer: Self-pay | Admitting: Internal Medicine

## 2014-11-07 ENCOUNTER — Ambulatory Visit (INDEPENDENT_AMBULATORY_CARE_PROVIDER_SITE_OTHER): Payer: Medicare Other | Admitting: Family Medicine

## 2014-11-07 ENCOUNTER — Encounter: Payer: Self-pay | Admitting: Family Medicine

## 2014-11-07 VITALS — BP 124/78 | HR 57 | Temp 98.1°F | Ht 67.0 in | Wt 203.8 lb

## 2014-11-07 DIAGNOSIS — T300 Burn of unspecified body region, unspecified degree: Secondary | ICD-10-CM | POA: Diagnosis not present

## 2014-11-07 MED ORDER — SILVER SULFADIAZINE 1 % EX CREA: 1.0000 "application " | TOPICAL_CREAM | Freq: Two times a day (BID) | CUTANEOUS | Status: DC
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
## 2014-11-07 NOTE — Progress Notes (Signed)
Pre visit review using our clinic review tool, if applicable. No additional management support is needed unless otherwise documented below in the visit note. 
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
## 2014-11-23 DIAGNOSIS — M25552 Pain in left hip: Secondary | ICD-10-CM | POA: Diagnosis not present

## 2014-11-23 DIAGNOSIS — M25551 Pain in right hip: Secondary | ICD-10-CM | POA: Diagnosis not present

## 2014-11-23 DIAGNOSIS — M5416 Radiculopathy, lumbar region: Secondary | ICD-10-CM | POA: Diagnosis not present

## 2014-11-29 DIAGNOSIS — M25552 Pain in left hip: Secondary | ICD-10-CM | POA: Diagnosis not present

## 2014-11-29 DIAGNOSIS — M25511 Pain in right shoulder: Secondary | ICD-10-CM | POA: Diagnosis not present

## 2014-12-14 ENCOUNTER — Telehealth: Payer: Self-pay

## 2014-12-14 NOTE — Telephone Encounter (Signed)
Sydney [name] with [facility] left v/m requesting cb for status of Prior auth for pantoprazole already sent over for plan limitations exceeded. Sydney [name] request cb.

## 2014-12-14 NOTE — Telephone Encounter (Signed)
Sent to [facility] for prior auth

## 2014-12-20 NOTE — Telephone Encounter (Signed)
Pt left v/m requesting cb about status of pantoprazole [place].

## 2014-12-21 NOTE — Telephone Encounter (Signed)
Sent manually to [facility] 12/21/2014, sent to [facility] on 12/14/2014 and stated they where unable to locate the patient and to verify the patients information.

## 2014-12-27 NOTE — Telephone Encounter (Signed)
Prior auth still in process

## 2014-12-27 NOTE — Telephone Encounter (Signed)
Sydney [name] with [facility] left v/m requesting status of prior authorization for plan limitations exceeded for pantoprazole.

## 2015-01-04 ENCOUNTER — Other Ambulatory Visit: Payer: Self-pay | Admitting: Internal Medicine

## 2015-01-11 ENCOUNTER — Ambulatory Visit (INDEPENDENT_AMBULATORY_CARE_PROVIDER_SITE_OTHER): Payer: Medicare Other

## 2015-01-11 DIAGNOSIS — Z23 Encounter for immunization: Secondary | ICD-10-CM

## 2015-01-23 ENCOUNTER — Ambulatory Visit (HOSPITAL_COMMUNITY)
Admission: RE | Admit: 2015-01-23 | Discharge: 2015-01-23 | Disposition: A | Discharge status: Home / Self Care | Payer: Medicare Other | Source: Ambulatory Visit | Attending: Family | Admitting: Family

## 2015-01-23 ENCOUNTER — Ambulatory Visit: Payer: Medicare Other | Admitting: Family

## 2015-01-23 ENCOUNTER — Encounter: Payer: Self-pay | Admitting: Family

## 2015-01-23 ENCOUNTER — Inpatient Hospital Stay (INDEPENDENT_AMBULATORY_CARE_PROVIDER_SITE_OTHER)
Admission: RE | Admit: 2015-01-23 | Discharge: 2015-01-23 | Disposition: A | Discharge status: Home / Self Care | Payer: Medicare Other | Source: Ambulatory Visit | Attending: Family | Admitting: Family

## 2015-01-23 ENCOUNTER — Ambulatory Visit (INDEPENDENT_AMBULATORY_CARE_PROVIDER_SITE_OTHER): Payer: Medicare Other | Admitting: Family

## 2015-01-23 VITALS — BP 138/77 | HR 55 | Temp 97.7°F | Resp 16 | Ht 67.0 in | Wt 204.0 lb

## 2015-01-23 DIAGNOSIS — I723 Aneurysm of iliac artery: Secondary | ICD-10-CM | POA: Insufficient documentation

## 2015-01-23 DIAGNOSIS — R0989 Other specified symptoms and signs involving the circulatory and respiratory systems: Secondary | ICD-10-CM | POA: Diagnosis not present

## 2015-01-23 DIAGNOSIS — E785 Hyperlipidemia, unspecified: Secondary | ICD-10-CM | POA: Insufficient documentation

## 2015-01-23 DIAGNOSIS — I10 Essential (primary) hypertension: Secondary | ICD-10-CM | POA: Insufficient documentation

## 2015-01-23 NOTE — Progress Notes (Signed)
[facility] HISTORY AND PHYSICAL -Iliac artery aneurysm   History of Present Illness Sydney [name] is a 81 y.o. female patient of Dr. Donnetta Hutching who presents today for followup of her incidentally found iliac artery aneurysm. She underwent CT scan for degenerative disc disease and was found to have a 2 cm right common iliac artery aneurysm.  Her risk factors for aneurysmal growth are not the following: is not diabetic, does not use tobacco, blood pressure is well controlled, is not sedentary, takes a 81 mg ASA qod . A possible risk factor for her is dyslipidemia as she had to be taken off the Lipitor that she was on for years due to the "fuzzy feeling" she got from this but her PCP is closely monitoring her cholesterol, and the patient states that her last total cholesterol was 200.  Patient denies abdominal pain or new back pain.  She does have a history of lumbar surgery and chronic back problems with standing for long periods.  Pt smoker: non-smoker, never smoked. The patient denies claudication in legs with walking. The patient denies history of stroke or TIA symptoms.  Pt Diabetic: No Pt smoker: non-smoker   Pt meds include: Statin :Yes Betablocker: Yes ASA: Yes Other anticoagulants/antiplatelets: no  Past Medical History  Diagnosis Date  . Hx of lumbosacral spine surgery t  . Hypertension   . Lumbar spinal stenosis   . Iliac artery aneurysm ([place])   . Hyperlipidemia   . GERD (gastroesophageal reflux disease)     Social History Social History  Substance Use Topics  . Smoking status: Never Smoker   . Smokeless tobacco: Never Used  . Alcohol Use: No    Family History Family History  Problem Relation Age of Onset  . Heart disease Mother   . Heart disease Father     Heart Disease before age 52  . Heart attack Father   . Hyperlipidemia Father   . Heart disease Sister   . Diabetes Sister   . Heart disease Brother   . Hyperlipidemia Brother    . Heart disease Brother   . Cancer Brother     Past Surgical History  Procedure Laterality Date  . Spine surgery  2006 & 2010    posterior spinal fusion L4-5 L5S1  . Joint replacement  1992    right hip replacement  . Joint replacement  2002    left hip replacement  . Rotator cuff repair  2002    Left shoulder    Allergies  Allergen Reactions  . Lipitor [Atorvastatin]     Dizzy ( fuzziness)  . Latex Rash    Current Outpatient Prescriptions  Medication Sig Dispense Refill  . aspirin 81 MG tablet Take 81 mg by mouth every other day.    . furosemide (LASIX) 40 MG tablet TAKE 1 TABLET BY MOUTH EVERY DAY 90 tablet 1  . Multiple Vitamin (MULTIVITAMIN) tablet Take 1 tablet by mouth daily.    . nadolol (CORGARD) 40 MG tablet TAKE 1 TABLET BY MOUTH EVERY DAY 90 tablet 3  . pantoprazole (PROTONIX) 40 MG tablet Take 1 tablet (40 mg total) by mouth 2 (two) times daily. On empty stomach 60 tablet 11  . pravastatin (PRAVACHOL) 40 MG tablet TAKE 1 TABLET BY MOUTH DAILY 90 tablet 3  . silver sulfADIAZINE (SILVADENE) 1 % cream Apply 1 application topically 2 (two) times daily. 50 g 1   No current facility-administered medications for this visit.    ROS:  See HPI for pertinent positives and negatives.   Physical Examination  Filed Vitals:   01/23/15 0922  BP: 138/77  Pulse: 55  Temp: 97.7 F (36.5 C)  TempSrc: Oral  Resp: 16  Height: 5\' 7"  (1.702 m)  Weight: 204 lb (92.534 kg)  SpO2: 95%   Body mass index is 31.94 kg/(m^2).  General: A&O x 3, WD, obese female, walks with a cane.  Pulmonary: Sym exp, good air movt, CTAB, no rales, rhonchi, & wheezing.  Cardiac: RRR, Nl S1, S2, no murmur detected.   Carotid Bruits  Left  Right    Negative  Negative   VASCULAR EXAM:  LE Pulses  LEFT  RIGHT   FEMORAL  2+palpable  Not palpable   POPLITEAL  3+palpable  2+palpable   POSTERIOR TIBIAL  1+palpable  2+palpable   DORSALIS PEDIS  ANTERIOR TIBIAL   1+palpable  2+palpable   AORTA  Not palpable  N/A    Gastrointestinal: soft, NTND, -G/R, - HSM, - masses palpated, - CVAT B.  Musculoskeletal: M/S 5/5 throughout, Extremities without ischemic changes, no peripheral edema.  Neurologic: CN 2-12 intact, Pain and light touch intact in extremities, Motor exam as listed above           Non-Invasive Vascular Imaging: DATE: 01/23/2015 ABDOMINAL AORTA DUPLEX EVALUATION    INDICATION: Follow up common iliac artery aneurysm    PREVIOUS INTERVENTION(S): None    DUPLEX EXAM:     LOCATION DIAMETER AP (cm) DIAMETER TRANSVERSE (cm) VELOCITIES (cm/sec)  Aorta Proximal 2.0 2.0 93  Aorta Mid 1.9 1.8 93  Aorta Distal 1.9 1.6 109  Right Common Iliac Artery 2.1 1.8 124  Left Common Iliac Artery 1.7 1.4 67    Previous max aortic diameter:  Right CIA 2.0 x 1.6 Date: 01/10/2014     ADDITIONAL FINDINGS:     IMPRESSION: 1. Technically difficult exam due to body habitus and bowel gas 2. Aneurysmal proximal right common iliac artery aneurysm measuring 2.1 x 1.8 cms,  3. No evidence of abdominal aortic aneurysm    Compared to the previous exam:  Minimal increase in size     ASSESSMENT: Sydney [name] is a 81 y.o. female with asymptomatic right common iliac artery aneurysm, no increase in size compared to a year ago; maximum diameter today is 2.1 cm. Prominent left popliteal pulse, no ultrasound of leg or popliteal arteries on file. She denies claudication symptoms with walking. Pt agreed to return this week for popliteal artery duplex.   PLAN:  Based on the patient's vascular studies and examination, pt will return to clinic in 1 year with aortoiliac Duplex.  Return in a few days for bilateral popliteal artery duplex and see me for discussion of results.  I discussed in depth with the patient the nature of atherosclerosis, and emphasized the importance of maximal medical management including strict control of blood pressure, blood  glucose, and lipid levels, obtaining regular exercise, and continued cessation of smoking.  The patient is aware that without maximal medical management the underlying atherosclerotic disease process will progress, limiting the benefit of any interventions.  The patient was given information about PAD including signs, symptoms, treatment, what symptoms should prompt the patient to seek immediate medical care, and risk reduction measures to take.  Clemon Chambers, RN, MSN, FNP-C [facility] Phone: [phone]  Clinic MD: Early  01/23/2015 9:35 AM

## 2015-01-26 ENCOUNTER — Ambulatory Visit (HOSPITAL_COMMUNITY)
Admission: RE | Admit: 2015-01-26 | Discharge: 2015-01-26 | Disposition: A | Discharge status: Home / Self Care | Payer: Medicare Other | Source: Ambulatory Visit | Attending: Vascular Surgery | Admitting: Vascular Surgery

## 2015-01-26 DIAGNOSIS — K219 Gastro-esophageal reflux disease without esophagitis: Secondary | ICD-10-CM | POA: Insufficient documentation

## 2015-01-26 DIAGNOSIS — I723 Aneurysm of iliac artery: Secondary | ICD-10-CM | POA: Insufficient documentation

## 2015-01-26 DIAGNOSIS — E785 Hyperlipidemia, unspecified: Secondary | ICD-10-CM | POA: Diagnosis not present

## 2015-01-26 DIAGNOSIS — I10 Essential (primary) hypertension: Secondary | ICD-10-CM | POA: Insufficient documentation

## 2015-01-26 DIAGNOSIS — R0989 Other specified symptoms and signs involving the circulatory and respiratory systems: Secondary | ICD-10-CM | POA: Diagnosis not present

## 2015-01-31 ENCOUNTER — Telehealth: Payer: Self-pay | Admitting: *Deleted

## 2015-01-31 NOTE — Telephone Encounter (Signed)
-----   Message from Viann Fish, NP sent at 01/30/2015  4:41 PM EDT ----- Regarding: FW: Ultrasound results from 10/14 Contact: [phone] Zigmund Daniel, Please call Ms. Selmon and let her know that she does not have any aneurysms of the arteries behind her knees. Thank you, Vinnie Level  ----- Message -----    From: Rufina Falco    Sent: 01/30/2015   1:49 PM      To: Sharmon Leyden Nickel, NP Subject: Ultrasound results from 10/14                  Please call Mrs. Calarco with her ultrasound results from 10/14.  She stated she will be out for a bit but you can leave the results on her machine.     Melina

## 2015-01-31 NOTE — Telephone Encounter (Signed)
I called the patient and explained to her that her studies were negative for popliteal aneurysms per the note below from out NP.  Sydney [name] voiced understanding of this information

## 2015-02-22 ENCOUNTER — Ambulatory Visit: Payer: Medicare Other | Admitting: Family

## 2015-02-22 ENCOUNTER — Other Ambulatory Visit (HOSPITAL_COMMUNITY): Payer: Medicare Other

## 2015-04-12 ENCOUNTER — Encounter: Payer: Self-pay | Admitting: *Deleted

## 2015-05-11 DIAGNOSIS — Z6834 Body mass index (BMI) 34.0-34.9, adult: Secondary | ICD-10-CM | POA: Diagnosis not present

## 2015-05-11 DIAGNOSIS — E7801 Familial hypercholesterolemia: Secondary | ICD-10-CM | POA: Diagnosis not present

## 2015-05-11 DIAGNOSIS — M150 Primary generalized (osteo)arthritis: Secondary | ICD-10-CM | POA: Diagnosis not present

## 2015-05-19 DIAGNOSIS — K922 Gastrointestinal hemorrhage, unspecified: Secondary | ICD-10-CM | POA: Diagnosis not present

## 2015-05-19 DIAGNOSIS — M1990 Unspecified osteoarthritis, unspecified site: Secondary | ICD-10-CM | POA: Diagnosis not present

## 2015-05-19 DIAGNOSIS — K263 Acute duodenal ulcer without hemorrhage or perforation: Secondary | ICD-10-CM | POA: Diagnosis not present

## 2015-05-19 DIAGNOSIS — R110 Nausea: Secondary | ICD-10-CM | POA: Diagnosis not present

## 2015-05-19 DIAGNOSIS — K269 Duodenal ulcer, unspecified as acute or chronic, without hemorrhage or perforation: Secondary | ICD-10-CM | POA: Diagnosis not present

## 2015-05-19 DIAGNOSIS — R109 Unspecified abdominal pain: Secondary | ICD-10-CM | POA: Diagnosis not present

## 2015-05-19 DIAGNOSIS — I10 Essential (primary) hypertension: Secondary | ICD-10-CM | POA: Diagnosis not present

## 2015-05-19 DIAGNOSIS — K921 Melena: Secondary | ICD-10-CM | POA: Diagnosis not present

## 2015-05-19 DIAGNOSIS — R1013 Epigastric pain: Secondary | ICD-10-CM | POA: Diagnosis not present

## 2015-05-19 DIAGNOSIS — K2950 Unspecified chronic gastritis without bleeding: Secondary | ICD-10-CM | POA: Diagnosis not present

## 2015-05-19 DIAGNOSIS — D500 Iron deficiency anemia secondary to blood loss (chronic): Secondary | ICD-10-CM | POA: Diagnosis not present

## 2015-05-28 DIAGNOSIS — Z6834 Body mass index (BMI) 34.0-34.9, adult: Secondary | ICD-10-CM | POA: Diagnosis not present

## 2015-05-28 DIAGNOSIS — K277 Chronic peptic ulcer, site unspecified, without hemorrhage or perforation: Secondary | ICD-10-CM | POA: Diagnosis not present

## 2015-07-02 ENCOUNTER — Other Ambulatory Visit: Payer: Self-pay | Admitting: Internal Medicine

## 2015-07-23 ENCOUNTER — Ambulatory Visit (INDEPENDENT_AMBULATORY_CARE_PROVIDER_SITE_OTHER): Payer: Medicare Other | Admitting: Internal Medicine

## 2015-07-23 ENCOUNTER — Encounter: Payer: Self-pay | Admitting: Internal Medicine

## 2015-07-23 VITALS — BP 132/84 | HR 55 | Temp 97.4°F | Ht 65.0 in | Wt 208.0 lb

## 2015-07-23 DIAGNOSIS — I723 Aneurysm of iliac artery: Secondary | ICD-10-CM | POA: Diagnosis not present

## 2015-07-23 DIAGNOSIS — Z7189 Other specified counseling: Secondary | ICD-10-CM

## 2015-07-23 DIAGNOSIS — E785 Hyperlipidemia, unspecified: Secondary | ICD-10-CM | POA: Insufficient documentation

## 2015-07-23 DIAGNOSIS — K219 Gastro-esophageal reflux disease without esophagitis: Secondary | ICD-10-CM | POA: Diagnosis not present

## 2015-07-23 DIAGNOSIS — I10 Essential (primary) hypertension: Secondary | ICD-10-CM

## 2015-07-23 DIAGNOSIS — Z0000 Encounter for general adult medical examination without abnormal findings: Secondary | ICD-10-CM | POA: Diagnosis not present

## 2015-07-23 DIAGNOSIS — L570 Actinic keratosis: Secondary | ICD-10-CM

## 2015-07-23 LAB — CBC WITH DIFFERENTIAL/PLATELET
Basophils Absolute: 0 10*3/uL (ref 0.0–0.1)
Basophils Relative: 0.4 % (ref 0.0–3.0)
EOS PCT: 2.2 % (ref 0.0–5.0)
Eosinophils Absolute: 0.2 10*3/uL (ref 0.0–0.7)
HEMATOCRIT: 43.3 % (ref 36.0–46.0)
HEMOGLOBIN: 14.5 g/dL (ref 12.0–15.0)
LYMPHS ABS: 1.4 10*3/uL (ref 0.7–4.0)
LYMPHS PCT: 18.2 % (ref 12.0–46.0)
MCHC: 33.4 g/dL (ref 30.0–36.0)
MCV: 86.9 fl (ref 78.0–100.0)
MONOS PCT: 8.4 % (ref 3.0–12.0)
Monocytes Absolute: 0.6 10*3/uL (ref 0.1–1.0)
NEUTROS PCT: 70.8 % (ref 43.0–77.0)
Neutro Abs: 5.4 10*3/uL (ref 1.4–7.7)
Platelets: 225 10*3/uL (ref 150.0–400.0)
RBC: 4.98 Mil/uL (ref 3.87–5.11)
RDW: 13.7 % (ref 11.5–15.5)
WBC: 7.6 10*3/uL (ref 4.0–10.5)

## 2015-07-23 LAB — LIPID PANEL
CHOL/HDL RATIO: 5
Cholesterol: 269 mg/dL — ABNORMAL HIGH (ref 0–200)
HDL: 55.3 mg/dL (ref >39.00)
LDL CALC: 190 mg/dL — AB (ref 0–99)
NONHDL: 213.71
Triglycerides: 121 mg/dL (ref 0.0–149.0)
VLDL: 24.2 mg/dL (ref 0.0–40.0)

## 2015-07-23 LAB — COMPREHENSIVE METABOLIC PANEL
ALT: 17 U/L (ref 0–35)
AST: 21 U/L (ref 0–37)
Albumin: 4.2 g/dL (ref 3.5–5.2)
Alkaline Phosphatase: 64 U/L (ref 39–117)
BUN: 21 mg/dL (ref 6–23)
CALCIUM: 10.1 mg/dL (ref 8.4–10.5)
CHLORIDE: 107 meq/L (ref 96–112)
CO2: 24 meq/L (ref 19–32)
Creatinine, Ser: 0.92 mg/dL (ref 0.40–1.20)
GFR: 62.28 mL/min (ref >60.00)
GLUCOSE: 113 mg/dL — AB (ref 70–99)
POTASSIUM: 3.7 meq/L (ref 3.5–5.1)
Sodium: 142 mEq/L (ref 135–145)
Total Bilirubin: 0.5 mg/dL (ref 0.2–1.2)
Total Protein: 7.7 g/dL (ref 6.0–8.3)

## 2015-07-23 LAB — T4, FREE: Free T4: 0.95 ng/dL (ref 0.60–1.60)

## 2015-07-23 MED ORDER — PRAVASTATIN SODIUM 40 MG PO TABS: 40.0000 mg | ORAL_TABLET | Freq: Every day | ORAL | Status: DC

## 2015-07-23 MED ORDER — TETANUS-DIPHTHERIA TOXOIDS TD 5-2 LFU IM INJ: 0.5000 mL | INJECTION | Freq: Once | INTRAMUSCULAR | Status: DC

## 2015-07-23 NOTE — Assessment & Plan Note (Signed)
Has DNR 

## 2015-07-23 NOTE — Patient Instructions (Signed)
DASH Eating Plan  DASH stands for "Dietary Approaches to Stop Hypertension." The DASH eating plan is a healthy eating plan that has been shown to reduce high blood pressure (hypertension). Additional health benefits may include reducing the risk of type 2 diabetes mellitus, heart disease, and stroke. The DASH eating plan may also help with weight loss.  WHAT DO I NEED TO KNOW ABOUT THE DASH EATING PLAN?  For the DASH eating plan, you will follow these general guidelines:  · Choose foods with a percent daily value for sodium of less than 5% (as listed on the food label).  · Use salt-free seasonings or herbs instead of table salt or sea salt.  · Check with your health care provider or pharmacist before using salt substitutes.  · Eat lower-sodium products, often labeled as "lower sodium" or "no salt added."  · Eat fresh foods.  · Eat more vegetables, fruits, and low-fat dairy products.  · Choose whole grains. Look for the word "whole" as the first word in the ingredient list.  · Choose fish and skinless chicken or turkey more often than red meat. Limit fish, poultry, and meat to 6 oz (170 g) each day.  · Limit sweets, desserts, sugars, and sugary drinks.  · Choose heart-healthy fats.  · Limit cheese to 1 oz (28 g) per day.  · Eat more home-cooked food and less restaurant, buffet, and fast food.  · Limit fried foods.  · Cook foods using methods other than frying.  · Limit canned vegetables. If you do use them, rinse them well to decrease the sodium.  · When eating at a restaurant, ask that your food be prepared with less salt, or no salt if possible.  WHAT FOODS CAN I EAT?  Seek help from a dietitian for individual calorie needs.  Grains  Whole grain or whole wheat bread. Brown rice. Whole grain or whole wheat pasta. Quinoa, bulgur, and whole grain cereals. Low-sodium cereals. Corn or whole wheat flour tortillas. Whole grain cornbread. Whole grain crackers. Low-sodium crackers.  Vegetables  Fresh or frozen vegetables  (raw, steamed, roasted, or grilled). Low-sodium or reduced-sodium tomato and vegetable juices. Low-sodium or reduced-sodium tomato sauce and paste. Low-sodium or reduced-sodium canned vegetables.   Fruits  All fresh, canned (in natural juice), or frozen fruits.  Meat and Other Protein Products  Ground beef (85% or leaner), grass-fed beef, or beef trimmed of fat. Skinless chicken or turkey. Ground chicken or turkey. Pork trimmed of fat. All fish and seafood. Eggs. Dried beans, peas, or lentils. Unsalted nuts and seeds. Unsalted canned beans.  Dairy  Low-fat dairy products, such as skim or 1% milk, 2% or reduced-fat cheeses, low-fat ricotta or cottage cheese, or plain low-fat yogurt. Low-sodium or reduced-sodium cheeses.  Fats and Oils  Tub margarines without trans fats. Light or reduced-fat mayonnaise and salad dressings (reduced sodium). Avocado. Safflower, olive, or canola oils. Natural peanut or almond butter.  Other  Unsalted popcorn and pretzels.  The items listed above may not be a complete list of recommended foods or beverages. Contact your dietitian for more options.  WHAT FOODS ARE NOT RECOMMENDED?  Grains  White bread. White pasta. White rice. Refined cornbread. Bagels and croissants. Crackers that contain trans fat.  Vegetables  Creamed or fried vegetables. Vegetables in a cheese sauce. Regular canned vegetables. Regular canned tomato sauce and paste. Regular tomato and vegetable juices.  Fruits  Dried fruits. Canned fruit in light or heavy syrup. Fruit juice.  Meat and Other Protein   Products  Fatty cuts of meat. Ribs, chicken wings, bacon, sausage, bologna, salami, chitterlings, fatback, hot dogs, bratwurst, and packaged luncheon meats. Salted nuts and seeds. Canned beans with salt.  Dairy  Whole or 2% milk, cream, half-and-half, and cream cheese. Whole-fat or sweetened yogurt. Full-fat cheeses or blue cheese. Nondairy creamers and whipped toppings. Processed cheese, cheese spreads, or cheese  curds.  Condiments  Onion and garlic salt, seasoned salt, table salt, and sea salt. Canned and packaged gravies. Worcestershire sauce. Tartar sauce. Barbecue sauce. Teriyaki sauce. Soy sauce, including reduced sodium. Steak sauce. Fish sauce. Oyster sauce. Cocktail sauce. Horseradish. Ketchup and mustard. Meat flavorings and tenderizers. Bouillon cubes. Hot sauce. Tabasco sauce. Marinades. Taco seasonings. Relishes.  Fats and Oils  Butter, stick margarine, lard, shortening, ghee, and bacon fat. Coconut, palm kernel, or palm oils. Regular salad dressings.  Other  Pickles and olives. Salted popcorn and pretzels.  The items listed above may not be a complete list of foods and beverages to avoid. Contact your dietitian for more information.  WHERE CAN I FIND MORE INFORMATION?  National Heart, Lung, and [employer] Institute: www.nhlbi.nih.gov/health/health-topics/topics/dash/     This information is not intended to replace advice given to you by your health care provider. Make sure you discuss any questions you have with your health care provider.     Document Released: 03/20/2011 Document Revised: 04/21/2014 Document Reviewed: 02/02/2013  Elsevier Interactive Patient Education ©2016 Elsevier Inc.

## 2015-07-23 NOTE — Progress Notes (Signed)
Subjective:    Patient ID: Sydney [name], female    DOB: 06-03-1932, 82 y.o.   MRN: [id number]  HPI Here for Medicare wellness and follow up of chronic medical conditions Reviewed form and advanced directives Reviewed other doctors No alcohol or tobacco Vision and hearing are okay No falls No depression or anhedonia Tries to exercise regularly Independent with instrumental ADLs Cognitive function seems stable--no sig problems  Did have hospital visit in [place] to have peptic ulcer Put back on PPI Had EGD and confirmed peptic ulcer On omeprazole and this has controlled the symptoms  No chest pain No palpitations No SOB--stable exercise tolerance No edema--takes the furosemide about every other day No headaches, dizziness or syncope  Sees the vascular surgeon yearly Off the low dose aspirin due to ulcer---will have her restart  Off the pravastatin for at least 6 months Doesn't remember a problem so is willing to restart  Current Outpatient Prescriptions on File Prior to Visit  Medication Sig Dispense Refill  . furosemide (LASIX) 40 MG tablet TAKE 1 TABLET BY MOUTH EVERY DAY 90 tablet 0  . nadolol (CORGARD) 40 MG tablet TAKE 1 TABLET BY MOUTH EVERY DAY 90 tablet 3   No current facility-administered medications on file prior to visit.    Allergies  Allergen Reactions  . Lipitor [Atorvastatin]     Dizzy ( fuzziness)  . Latex Rash    Past Medical History  Diagnosis Date  . Hx of lumbosacral spine surgery t  . Hypertension   . Lumbar spinal stenosis   . Iliac artery aneurysm ([place])   . Hyperlipidemia   . GERD (gastroesophageal reflux disease)     Past Surgical History  Procedure Laterality Date  . Spine surgery  2006 & 2010    posterior spinal fusion L4-5 L5S1  . Joint replacement  1992    right hip replacement  . Joint replacement  2002    left hip replacement  . Rotator cuff repair  2002    Left shoulder    Family History  Problem Relation  Age of Onset  . Heart disease Mother   . Heart disease Father     Heart Disease before age 57  . Heart attack Father   . Hyperlipidemia Father   . Heart disease Sister   . Diabetes Sister   . Heart disease Brother   . Hyperlipidemia Brother   . Heart disease Brother   . Cancer Brother     Social History   Social History  . Marital Status: Married    Spouse Name: N/A  . Number of Children: N/A  . Years of Education: N/A   Occupational History  . Not on file.   Social History Main Topics  . Smoking status: Never Smoker   . Smokeless tobacco: Never Used  . Alcohol Use: No  . Drug Use: No  . Sexual Activity: Not on file   Other Topics Concern  . Not on file   Social History Narrative   Has living will   Husband is health care POA   Discussed DNR--she requests (done 11/30/12)   No tube feedings if cognitively unaware   Review of Systems New lesion on left temple--gets crusted, etc. Wants it checked Sleeps well Appetite is fine Weight is about the same Using diclofenac on shoulder--this helps Energy levels are fine Wears seat belt Bowels are fine--no blood in stool. Pepto bismol did turn it dark Voids okay but occasional stinging for 6 weeks.  Tried vagisil without improvement. No discharge No other skin lesions     Objective:   Physical Exam  Constitutional: She is oriented to person, place, and time. She appears well-developed and well-nourished. No distress.  HENT:  Mouth/Throat: Oropharynx is clear and moist. No oropharyngeal exudate.  Neck: Normal range of motion. Neck supple. No thyromegaly present.  Cardiovascular: Normal rate, regular rhythm, normal heart sounds and intact distal pulses.  Exam reveals no gallop.   No murmur heard. Pulmonary/Chest: Effort normal and breath sounds normal. No respiratory distress. She has no wheezes. She has no rales.  Abdominal: Soft. There is no tenderness.  Musculoskeletal: She exhibits no edema or tenderness.    Lymphadenopathy:    She has no cervical adenopathy.  Neurological: She is alert and oriented to person, place, and time.  President-- "Trump, Obama, Clinton--- ?" [phone] [id number] Recall 2/3  Skin: No rash noted. No erythema.  ~27mm actinic in left temple area  Psychiatric: She has a normal mood and affect. Her behavior is normal.          Assessment & Plan:

## 2015-07-23 NOTE — Assessment & Plan Note (Signed)
Has had ongoing reflux symptoms--then peptic ulcer found on EGD Needs to stay on the PPI (could consider decreasing dose next time)

## 2015-07-23 NOTE — Assessment & Plan Note (Signed)
No progression Gets yearly evaluations

## 2015-07-23 NOTE — Assessment & Plan Note (Signed)
Statin doesn't seem to have caused the GI issues Will plan to restart the pravastatin if remains elevated

## 2015-07-23 NOTE — Progress Notes (Signed)
Pre visit review using our clinic review tool, if applicable. No additional management support is needed unless otherwise documented below in the visit note. 

## 2015-07-23 NOTE — Assessment & Plan Note (Signed)
BP Readings from Last 3 Encounters:  07/23/15 132/84  01/23/15 138/77  11/07/14 124/78   Good control still

## 2015-07-23 NOTE — Assessment & Plan Note (Signed)
Treated with liquid nitrogen 45 seconds x 2 To derm if recurs--she understands this

## 2015-07-23 NOTE — Assessment & Plan Note (Signed)
I have personally reviewed the Medicare Annual Wellness questionnaire and have noted 1. The patient's medical and social history 2. Their use of alcohol, tobacco or illicit drugs 3. Their current medications and supplements 4. The patient's functional ability including ADL's, fall risks, home safety risks and hearing or visual             impairment. 5. Diet and physical activities 6. Evidence for depression or mood disorders  The patients weight, height, BMI and visual acuity have been recorded in the chart I have made referrals, counseling and provided education to the patient based review of the above and I have provided the pt with a written personalized care plan for preventive services.  I have provided you with a copy of your personalized plan for preventive services. Please take the time to review along with your updated medication list.  No cancer screening Due for Td Dietary info given Yearly flu shot

## 2015-07-24 ENCOUNTER — Telehealth: Payer: Self-pay

## 2015-07-24 MED ORDER — PRAVASTATIN SODIUM 40 MG PO TABS: 40.0000 mg | ORAL_TABLET | Freq: Every day | ORAL | Status: DC

## 2015-07-24 NOTE — Telephone Encounter (Signed)
Spoke to patient. She needed an new rx. I have sent it to her pharmacy.

## 2015-07-24 NOTE — Telephone Encounter (Signed)
-----   Message from Venia Carbon, MD sent at 07/24/2015  8:07 AM EDT ----- Please call The labs look fine but the cholesterol is pretty high---she should restart the pravastatin Slightly elevated blood sugar (glucose) is not concerning Everything else is normal Send copy for her records

## 2015-08-09 ENCOUNTER — Encounter (HOSPITAL_COMMUNITY): Payer: Self-pay | Admitting: Emergency Medicine

## 2015-08-09 ENCOUNTER — Telehealth: Payer: Self-pay

## 2015-08-09 ENCOUNTER — Emergency Department (HOSPITAL_COMMUNITY): Payer: Medicare Other

## 2015-08-09 ENCOUNTER — Emergency Department (HOSPITAL_COMMUNITY)
Admission: EM | Admit: 2015-08-09 | Discharge: 2015-08-09 | Disposition: A | Discharge status: Home / Self Care | Payer: Medicare Other | Attending: Emergency Medicine | Admitting: Emergency Medicine

## 2015-08-09 DIAGNOSIS — S79912A Unspecified injury of left hip, initial encounter: Secondary | ICD-10-CM | POA: Diagnosis present

## 2015-08-09 DIAGNOSIS — Z91040 Latex allergy status: Secondary | ICD-10-CM | POA: Insufficient documentation

## 2015-08-09 DIAGNOSIS — Y9389 Activity, other specified: Secondary | ICD-10-CM | POA: Insufficient documentation

## 2015-08-09 DIAGNOSIS — Y658 Other specified misadventures during surgical and medical care: Secondary | ICD-10-CM | POA: Diagnosis not present

## 2015-08-09 DIAGNOSIS — S73005A Unspecified dislocation of left hip, initial encounter: Secondary | ICD-10-CM

## 2015-08-09 DIAGNOSIS — Z8739 Personal history of other diseases of the musculoskeletal system and connective tissue: Secondary | ICD-10-CM | POA: Insufficient documentation

## 2015-08-09 DIAGNOSIS — E785 Hyperlipidemia, unspecified: Secondary | ICD-10-CM | POA: Insufficient documentation

## 2015-08-09 DIAGNOSIS — K219 Gastro-esophageal reflux disease without esophagitis: Secondary | ICD-10-CM | POA: Diagnosis not present

## 2015-08-09 DIAGNOSIS — Y92239 Unspecified place in hospital as the place of occurrence of the external cause: Secondary | ICD-10-CM | POA: Diagnosis not present

## 2015-08-09 DIAGNOSIS — R001 Bradycardia, unspecified: Secondary | ICD-10-CM | POA: Insufficient documentation

## 2015-08-09 DIAGNOSIS — Y998 Other external cause status: Secondary | ICD-10-CM | POA: Diagnosis not present

## 2015-08-09 DIAGNOSIS — W1839XA Other fall on same level, initial encounter: Secondary | ICD-10-CM | POA: Diagnosis not present

## 2015-08-09 DIAGNOSIS — I10 Essential (primary) hypertension: Secondary | ICD-10-CM | POA: Insufficient documentation

## 2015-08-09 DIAGNOSIS — R52 Pain, unspecified: Secondary | ICD-10-CM

## 2015-08-09 DIAGNOSIS — Z791 Long term (current) use of non-steroidal anti-inflammatories (NSAID): Secondary | ICD-10-CM | POA: Insufficient documentation

## 2015-08-09 DIAGNOSIS — T84021A Dislocation of internal left hip prosthesis, initial encounter: Secondary | ICD-10-CM | POA: Diagnosis not present

## 2015-08-09 DIAGNOSIS — Z79899 Other long term (current) drug therapy: Secondary | ICD-10-CM | POA: Insufficient documentation

## 2015-08-09 LAB — BASIC METABOLIC PANEL
ANION GAP: 13 (ref 5–15)
BUN: 17 mg/dL (ref 6–20)
CALCIUM: 9.7 mg/dL (ref 8.9–10.3)
CO2: 24 mmol/L (ref 22–32)
Chloride: 105 mmol/L (ref 101–111)
Creatinine, Ser: 1.08 mg/dL — ABNORMAL HIGH (ref 0.44–1.00)
GFR, EST AFRICAN AMERICAN: 55 mL/min — AB (ref >60)
GFR, EST NON AFRICAN AMERICAN: 47 mL/min — AB (ref >60)
Glucose, Bld: 131 mg/dL — ABNORMAL HIGH (ref 65–99)
POTASSIUM: 3 mmol/L — AB (ref 3.5–5.1)
SODIUM: 142 mmol/L (ref 135–145)

## 2015-08-09 LAB — CBC
HCT: 42.5 % (ref 36.0–46.0)
Hemoglobin: 13.6 g/dL (ref 12.0–15.0)
MCH: 28.3 pg (ref 26.0–34.0)
MCHC: 32 g/dL (ref 30.0–36.0)
MCV: 88.4 fL (ref 78.0–100.0)
PLATELETS: 203 10*3/uL (ref 150–400)
RBC: 4.81 MIL/uL (ref 3.87–5.11)
RDW: 13.5 % (ref 11.5–15.5)
WBC: 4.7 10*3/uL (ref 4.0–10.5)

## 2015-08-09 LAB — I-STAT CHEM 8, ED
BUN: 17 mg/dL (ref 6–20)
CHLORIDE: 104 mmol/L (ref 101–111)
Calcium, Ion: 1.13 mmol/L (ref 1.13–1.30)
Creatinine, Ser: 0.9 mg/dL (ref 0.44–1.00)
GLUCOSE: 131 mg/dL — AB (ref 65–99)
HCT: 46 % (ref 36.0–46.0)
HEMOGLOBIN: 15.6 g/dL — AB (ref 12.0–15.0)
POTASSIUM: 2.9 mmol/L — AB (ref 3.5–5.1)
SODIUM: 143 mmol/L (ref 135–145)
TCO2: 23 mmol/L (ref 0–100)

## 2015-08-09 MED ORDER — PROPOFOL 10 MG/ML IV BOLUS
INTRAVENOUS | Status: DC | PRN
  Administered 2015-08-09: 20 mg via INTRAVENOUS

## 2015-08-09 MED ORDER — PROPOFOL 10 MG/ML IV BOLUS
0.5000 mg/kg | Freq: Once | INTRAVENOUS | Status: AC
Start: 2015-08-09 — End: 2015-08-09
  Administered 2015-08-09: 70 mg via INTRAVENOUS
  Filled 2015-08-09: qty 20

## 2015-08-09 MED ORDER — POTASSIUM CHLORIDE CRYS ER 20 MEQ PO TBCR
40.0000 meq | EXTENDED_RELEASE_TABLET | Freq: Two times a day (BID) | ORAL | Status: DC
  Administered 2015-08-09: 40 meq via ORAL
  Filled 2015-08-09: qty 2

## 2015-08-09 MED ORDER — HYDROMORPHONE HCL 1 MG/ML IJ SOLN
0.5000 mg | Freq: Once | INTRAMUSCULAR | Status: AC
  Administered 2015-08-09: 0.5 mg via INTRAVENOUS
  Filled 2015-08-09: qty 1

## 2015-08-09 NOTE — ED Provider Notes (Signed)
CSN: [id number]     Arrival date & time 08/09/15  0827 History   First MD Initiated Contact with Patient 08/09/15 0831     Chief Complaint  Patient presents with  . Fall  . Hip Pain     (Consider location/radiation/quality/duration/timing/severity/associated sxs/prior Treatment) HPI Comments: 82 year old female with history of hyperlipidemia, total hip replacement of the right hip and early 1990s in the left hip around 2002 presents for left hip pain. The patient reports she was visiting her husband in the hospital and had bent over to pick up her pocketbook when she felt her left hip give out and dislocated. She reports a history of this happening 2 other times in the past last time about 6 years ago. She reports that as soon as it gave out she did fall to the ground after grabbing hold of her husband's walker but denies hitting her head. She denies loss of consciousness. She is not on any blood thinners. She last ate yesterday. She did have a cup of coffee around 6:30 this morning. She denies any issues with anesthesia in the past.   Past Medical History  Diagnosis Date  . Hx of lumbosacral spine surgery t  . Hypertension   . Lumbar spinal stenosis   . Iliac artery aneurysm ([place])   . Hyperlipidemia   . GERD (gastroesophageal reflux disease)    Past Surgical History  Procedure Laterality Date  . Spine surgery  2006 & 2010    posterior spinal fusion L4-5 L5S1  . Joint replacement  1992    right hip replacement  . Joint replacement  2002    left hip replacement  . Rotator cuff repair  2002    Left shoulder   Family History  Problem Relation Age of Onset  . Heart disease Mother   . Heart disease Father     Heart Disease before age 51  . Heart attack Father   . Hyperlipidemia Father   . Heart disease Sister   . Diabetes Sister   . Heart disease Brother   . Hyperlipidemia Brother   . Heart disease Brother   . Cancer Brother    Social History  Substance Use Topics  .  Smoking status: Never Smoker   . Smokeless tobacco: Never Used  . Alcohol Use: No   OB History    No data available     Review of Systems  Constitutional: Negative for fever and unexpected weight change.  Respiratory: Negative for cough, chest tightness and shortness of breath.   Gastrointestinal: Negative for vomiting and abdominal pain.  Genitourinary: Negative for flank pain.  Musculoskeletal: Positive for arthralgias (left hip pain) and gait problem. Negative for myalgias, back pain, neck pain and neck stiffness.  Skin: Negative for rash and wound.  Neurological: Negative for dizziness, weakness and headaches.  Hematological: Does not bruise/bleed easily.      Allergies  Lipitor and Latex  Home Medications   Prior to Admission medications   Medication Sig Start Date End Date Taking? Authorizing Provider  diclofenac sodium (VOLTAREN) 1 % GEL Apply 2 g topically 2 (two) times daily. 07/17/15  Yes Historical Provider, MD  furosemide (LASIX) 40 MG tablet TAKE 1 TABLET BY MOUTH EVERY DAY 07/02/15  Yes Venia Carbon, MD  nadolol (CORGARD) 40 MG tablet TAKE 1 TABLET BY MOUTH EVERY DAY 10/25/14  Yes Venia Carbon, MD  omeprazole (PRILOSEC) 40 MG capsule Take 40 mg by mouth daily. 06/12/15  Yes Historical Provider, MD  pravastatin (PRAVACHOL) 40 MG tablet Take 1 tablet (40 mg total) by mouth daily. 07/24/15  Yes Venia Carbon, MD  tetanus & diphtheria toxoids, adult, (TENIVAC) 5-2 LFU injection Inject 0.5 mLs into the muscle once. 07/23/15  Yes Venia Carbon, MD   BP 127/62 mmHg  Pulse 54  Temp(Src) 97.6 F (36.4 C) (Oral)  Resp 20  Ht 5\' 7"  (1.702 m)  Wt 200 lb (90.719 kg)  BMI 31.32 kg/m2  SpO2 98% Physical Exam  Constitutional: She is oriented to person, place, and time. She appears well-developed and well-nourished. No distress.  HENT:  Head: Normocephalic and atraumatic.  Right Ear: External ear normal.  Left Ear: External ear normal.  Nose: Nose normal.   Mouth/Throat: Oropharynx is clear and moist. No oropharyngeal exudate.  Eyes: EOM are normal. Pupils are equal, round, and reactive to light.  Neck: Normal range of motion. Neck supple.  Cardiovascular: Regular rhythm, normal heart sounds and intact distal pulses.  Bradycardia present.   No murmur heard. Pulses:      Dorsalis pedis pulses are 2+ on the right side, and 2+ on the left side.  Pulmonary/Chest: Effort normal. No respiratory distress. She has no wheezes. She has no rales.  Abdominal: Soft. She exhibits no distension. There is no tenderness.  Musculoskeletal: She exhibits no edema.       Right hip: Normal.       Left hip: She exhibits decreased range of motion, tenderness and deformity (palpable femoral head lateral and superior to hip joint space). She exhibits no crepitus and no laceration.       Left knee: Normal.  Neurological: She is alert and oriented to person, place, and time.  Skin: Skin is warm and dry. No rash noted. She is not diaphoretic.  Vitals reviewed.   ED Course  .Sedation Date/Time: 08/09/2015 11:03 AM Performed by: Lonia Skinner ROE Authorized by: Harvel Quale  Consent:    Consent obtained:  Verbal and written   Consent given by:  Patient   Risks discussed:  Allergic reaction, prolonged hypoxia resulting in organ damage, prolonged sedation necessitating reversal and respiratory compromise necessitating ventilatory assistance and intubation Indications:    Sedation purpose:  Dislocation reduction   Procedure necessitating sedation performed by:  Physician performing sedation   Intended level of sedation:  Moderate (conscious sedation) Pre-sedation assessment:    Time since last food or drink:  4   ASA classification: class 2 - patient with mild systemic disease     Neck mobility: normal     Mallampati score:  II - soft palate, uvula, fauces visible   Pre-sedation assessments completed and reviewed: airway patency, cardiovascular function,  hydration status, mental status, nausea/vomiting, pain level and respiratory function   Immediate pre-procedure details:    Reassessment: Patient reassessed immediately prior to procedure     Reviewed: vital signs, relevant labs/tests and NPO status     Verified: bag valve mask available, emergency equipment available, IV patency confirmed, oxygen available and suction available   Procedure details (see MAR for exact dosages):    Preoxygenation:  Nasal cannula   Sedation:  Propofol   Intra-procedure monitoring:  Blood pressure monitoring, cardiac monitor, continuous capnometry, continuous pulse oximetry, frequent LOC assessments and frequent vital sign checks   Intra-procedure events: none     Intra-procedure management:  Airway repositioning Post-procedure details:    Attendance: Constant attendance by certified staff until patient recovered     Recovery: Patient returned to pre-procedure baseline  Post-sedation assessments completed and reviewed: airway patency, cardiovascular function, hydration status, mental status, nausea/vomiting, pain level, respiratory function and temperature     Patient is stable for discharge or admission: Yes     Patient tolerance:  Tolerated well, no immediate complications Reduction of dislocation Date/Time: 08/09/2015 11:06 AM Performed by: Lonia Skinner ROE Authorized by: Harvel Quale Consent: Verbal consent obtained. Written consent obtained. Risks and benefits: risks, benefits and alternatives were discussed Consent given by: patient Required items: required blood products, implants, devices, and special equipment available Patient identity confirmed: verbally with patient and arm band Local anesthesia used: no Patient sedated: yes Sedation type: moderate (conscious) sedation Sedatives: propofol Vitals: Vital signs were monitored during sedation. Patient tolerance: Patient tolerated the procedure well with no immediate complications Comments:  Successful left hip reduction completed without complication. Patient placed in knee immobilizer following procedure.  Reduction completed with assistance from Domenic Moras, [facility].   (including critical care time) Labs Review Labs Reviewed  BASIC METABOLIC PANEL - Abnormal; Notable for the following:    Potassium 3.0 (*)    Glucose, Bld 131 (*)    Creatinine, Ser 1.08 (*)    GFR calc non Af Amer 47 (*)    GFR calc Af Amer 55 (*)    All other components within normal limits  I-STAT CHEM 8, ED - Abnormal; Notable for the following:    Potassium 2.9 (*)    Glucose, Bld 131 (*)    Hemoglobin 15.6 (*)    All other components within normal limits  CBC    Imaging Review Dg Hip Unilat With Pelvis 2-3 Views Left  08/09/2015  CLINICAL DATA:  Status post left hip reduction EXAM: DG HIP (WITH OR WITHOUT PELVIS) 2-3V LEFT COMPARISON:  Film from earlier in the same day FINDINGS: There has been relocation of the femoral prosthesis within the acetabular component. No acute fracture or loosening is seen. The femoral prostheses appear to ride within the upper aspect of the acetabular component bilaterally which may represent some degeneration of the acetabular component. No soft tissue abnormality is noted. IMPRESSION: Relocation of the left hip prosthesis. The femoral prosthesis appears to lie higher within the acetabular cup bilaterally. 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 Electronically Signed   By: Inez Catalina M.D.   On: 08/09/2015 11:50   Dg Hip Unilat With Pelvis 2-3 Views Left  08/09/2015  CLINICAL DATA:  Left hip injury.  History of hip dislocation EXAM: DG HIP (WITH OR WITHOUT PELVIS) 2-3V LEFT COMPARISON:  None. FINDINGS: Left hip prosthesis is dislocated.  Negative for fracture Right hip replacement with superior subluxation the femoral component compatible with degeneration of the acetabular cup. Lumbar fusion. IMPRESSION: Dislocation left hip prosthesis. Electronically Signed   By:  Franchot Gallo M.D.   On: 08/09/2015 09:37   I have personally reviewed and evaluated these images and lab results as part of my medical decision-making.   EKG Interpretation None      MDM  Patient was seen and evaluated in stable condition. Patient neurovascularly intact.  Labs revealed mild hypokalemia patient was given oral supplementation and instructed to follow-up with her PCP regarding this. X-ray initially showed dislocated left hip without fracture. Patient was sedated and hip was relocated as detailed above. Follow-up x-ray showed that the reduction was successful. Patient was placed in a knee immobilizer as this is the third time it has dislocated. She was updated on the signs of where of her hardware on x-ray. She was  referred to orthopedics for follow-up. She and her grandson at bedside expressed understanding and agreement with plan of care.  She was instructed to not use the knee immobilizer if it made her feel unsteady but as she is going up stairs to spend the day with her husband and will be taken in a wheelchair she was instructed to keep it on for now for stability of her hip joint. Final diagnoses:  Hip dislocation, left, initial encounter ([place])    1. Left hip dislocation, reduced    Harvel Quale, MD 08/09/15 1201

## 2015-08-09 NOTE — Telephone Encounter (Signed)
Left message on voice mail asking to call back and let us know how she is doing after her ER visit this morning for dislocated hip.

## 2015-08-09 NOTE — ED Notes (Addendum)
Was visiting husband upstairs on [place] over to pick up something and left hip went out,  Pt fell to floor, rapid response arrived and pt placed on backboard and brought to ER c/o left hip pain has hx ofback and hip issues and surgery

## 2015-08-09 NOTE — Sedation Documentation (Signed)
Pt states feels much better, responds to voice

## 2015-08-09 NOTE — Sedation Documentation (Signed)
o2 increased to 3 liters

## 2015-08-09 NOTE — Sedation Documentation (Signed)
Ortho in to [facility] left knee imoblizer

## 2015-08-09 NOTE — Progress Notes (Signed)
Orthopedic Tech Progress Note Patient Details:  Sydney [name] 05-12-1932 [id number]  Ortho Devices Type of Ortho Device: Knee Immobilizer Ortho Device/Splint Interventions: Application   [name] 08/09/2015, 10:56 AM

## 2015-08-09 NOTE — Discharge Instructions (Signed)
You were seen and evaluated today for your left hip injury. Your hip was dislocated but it has been successfully relocated. It does appear that some of the hardware on your hip replacements is starting to fade. Use the knee immobilizer if you are able to to keep stability of your leg. Do not use it if you feel that it may make you fall. Follow-up with orthopedic surgeon as soon as possible outpatient.  Hip Dislocation Hip dislocation is the displacement of the "ball" at the head of your thigh bone (femur) from its socket in the hip bone (pelvis). The ball-and-socket structure of the hip joint gives it a lot of stability, while allowing it to move freely. Therefore, a lot of force is required to displace the femur from its socket. A hip dislocation is an emergency. If you believe you have dislocated your hip and cannot move your leg, call for help immediately. Do not try to move. CAUSES The most common cause of hip dislocation is motor vehicle accidents. However, force from falls from a height (a ladder or building), injuries from contact sports, or injuries from industrial accidents can be enough to dislocate your hip. SYMPTOMS A hip dislocation is very painful. If you have a dislocated hip, you will not be able to move your hip. If you have nerve damage, you may not have feeling in your lower leg, foot, or ankle.  DIAGNOSIS Usually, your caregiver can diagnose a hip dislocation by looking at the position of your leg. Generally, X-ray exams are done to check for fractures in your femur or pelvis. The leg of the dislocated hip will appear shorter than the other leg, and your foot will be turned inward. TREATMENT  Your caregiver can manipulate your bones back into the joint (reduction). If there are no other complications involved with your dislocation, such as fractures or damage to blood vessels or nerves, this procedure can be done without surgery. Before this procedure, you will be given medicine so  that you will not feel pain (anesthetic). Often specialized imaging exams are done after the reduction (magnetic resonance imaging [MRI] or computed tomography [CT]) to check for loose pieces of cartilage or bone in the joint. If a manual reduction fails or you have nerve damage, damage to your blood vessels, or bone fractures, surgery will be necessary to perform the reduction.  HOME CARE INSTRUCTIONS The following measures can help to reduce pain and speed up the healing process:  Rest your injured joint. Do not move your joint if it is painful. Also, avoid activities similar to the one that caused your injury.  Apply ice to your injured joint for 1 to 2 days after your reduction or as directed by your caregiver. Applying ice helps to reduce inflammation and pain.  Put ice in a plastic bag.  Place a towel between your skin and the bag.  Leave the ice on for 15 to 20 minutes at a time, every 2 hours while you are awake.  Use crutches or a walker as directed by your caregiver.  Exercise your hip and leg as directed by your caregiver.  Take over-the-counter or prescription medicine for pain as directed by your caregiver. SEEK IMMEDIATE MEDICAL CARE IF:  Your pain becomes worse rather than better.  You feel like your hip has become dislocated again. MAKE SURE YOU:  Understand these instructions.  Will watch your condition.  Will get help right away if you are not doing well or get worse.  This information is not intended to replace advice given to you by your health care provider. Make sure you discuss any questions you have with your health care provider.   Document Released: 12/24/2000 Document Revised: 04/21/2014 Document Reviewed: 10/31/2014 Elsevier Interactive Patient Education [employer].

## 2015-08-09 NOTE — ED Notes (Signed)
Patient alert and oriented at discharge.  Patient assisted at discharge via wheelchair.  Knee immobilizer in place at discharge.

## 2015-08-13 NOTE — Telephone Encounter (Signed)
Spoke to patient. She said she is doing better.

## 2015-08-29 DIAGNOSIS — M25552 Pain in left hip: Secondary | ICD-10-CM | POA: Diagnosis not present

## 2015-10-04 ENCOUNTER — Other Ambulatory Visit: Payer: Self-pay | Admitting: Internal Medicine

## 2015-10-25 ENCOUNTER — Ambulatory Visit (INDEPENDENT_AMBULATORY_CARE_PROVIDER_SITE_OTHER): Payer: Medicare Other | Admitting: Internal Medicine

## 2015-10-25 ENCOUNTER — Encounter: Payer: Self-pay | Admitting: Internal Medicine

## 2015-10-25 VITALS — BP 140/90 | HR 73 | Temp 97.3°F | Wt 199.0 lb

## 2015-10-25 DIAGNOSIS — R0981 Nasal congestion: Secondary | ICD-10-CM

## 2015-10-25 DIAGNOSIS — E785 Hyperlipidemia, unspecified: Secondary | ICD-10-CM | POA: Diagnosis not present

## 2015-10-25 DIAGNOSIS — K279 Peptic ulcer, site unspecified, unspecified as acute or chronic, without hemorrhage or perforation: Secondary | ICD-10-CM | POA: Diagnosis not present

## 2015-10-25 NOTE — Assessment & Plan Note (Signed)
Will continue the statin--no clear side effects

## 2015-10-25 NOTE — Assessment & Plan Note (Signed)
Diagnosed in [place] Will have her check on H pylori status (presumed negative since no antibiotics given) Mild recurrent symptoms--will restart pantoprazole (but just daily)

## 2015-10-25 NOTE — Progress Notes (Signed)
Pre visit review using our clinic review tool, if applicable. No additional management support is needed unless otherwise documented below in the visit note. 

## 2015-10-25 NOTE — Assessment & Plan Note (Signed)
Discussed that I don't think this is related to her meds Not a cognitive thing Asked her to try saline spray for lavage Consider flonase

## 2015-10-25 NOTE — Patient Instructions (Addendum)
Please check with your [place] doctor to see if they checked you for Helicobactor pylori infection. You can try a saline spray for your nose. If that doesn't help the congestion--you can change to an over the counter fluticasone medicated spray daily.

## 2015-10-25 NOTE — Progress Notes (Signed)
   Subjective:    Patient ID: Sydney [name], female    DOB: Jul 15, 1932, 82 y.o.   MRN: [id number]  HPI She is having some trouble in her head--like "sinus fuzzy headedness" Had stopped the pravastatin while in [place]--now back on it since April  Had been sick in [place] Had EGD and found peptic ulcer Started on protonix--given for 1 month and then stopped Doesn't know if she had H pylori testing--but didn't have antibiotics for it  Wonders if she should start it again Does have some afternoon nausea---appetite is off Similar to past symptoms  Current Outpatient Prescriptions on File Prior to Visit  Medication Sig Dispense Refill  . furosemide (LASIX) 40 MG tablet TAKE 1 TABLET BY MOUTH EVERY DAY 90 tablet 3  . nadolol (CORGARD) 40 MG tablet TAKE 1 TABLET BY MOUTH EVERY DAY 90 tablet 3  . pravastatin (PRAVACHOL) 40 MG tablet Take 1 tablet (40 mg total) by mouth daily. 90 tablet 3   No current facility-administered medications on file prior to visit.    Allergies  Allergen Reactions  . Lipitor [Atorvastatin]     Dizzy ( fuzziness)  . Latex Rash    Past Medical History  Diagnosis Date  . Hx of lumbosacral spine surgery t  . Hypertension   . Lumbar spinal stenosis   . Iliac artery aneurysm ([place])   . Hyperlipidemia   . GERD (gastroesophageal reflux disease)     Past Surgical History  Procedure Laterality Date  . Spine surgery  2006 & 2010    posterior spinal fusion L4-5 L5S1  . Joint replacement  1992    right hip replacement  . Joint replacement  2002    left hip replacement  . Rotator cuff repair  2002    Left shoulder    Family History  Problem Relation Age of Onset  . Heart disease Mother   . Heart disease Father     Heart Disease before age 28  . Heart attack Father   . Hyperlipidemia Father   . Heart disease Sister   . Diabetes Sister   . Heart disease Brother   . Hyperlipidemia Brother   . Heart disease Brother   . Cancer Brother     Social  History   Social History  . Marital Status: Married    Spouse Name: N/A  . Number of Children: N/A  . Years of Education: N/A   Occupational History  . Not on file.   Social History Main Topics  . Smoking status: Never Smoker   . Smokeless tobacco: Never Used  . Alcohol Use: No  . Drug Use: No  . Sexual Activity: Not on file   Other Topics Concern  . Not on file   Social History Narrative   Has living will   Husband is health care POA   Discussed DNR--she requests (done 11/30/12)   No tube feedings if cognitively unaware   Review of Systems Weight is down a little No fever Has mucus in head    Objective:   Physical Exam  Constitutional: She appears well-developed and well-nourished. No distress.  HENT:  Mouth/Throat: Oropharynx is clear and moist. No oropharyngeal exudate.  No sinus tenderness Mild nasal congestion and thick mucus TMs normal  Abdominal: Soft. She exhibits no distension. There is no tenderness. There is no rebound and no guarding.          Assessment & Plan:

## 2015-11-02 ENCOUNTER — Other Ambulatory Visit: Payer: Self-pay | Admitting: Internal Medicine

## 2015-11-29 ENCOUNTER — Telehealth: Payer: Self-pay | Admitting: Internal Medicine

## 2015-11-29 ENCOUNTER — Ambulatory Visit (INDEPENDENT_AMBULATORY_CARE_PROVIDER_SITE_OTHER): Payer: Medicare Other | Admitting: Internal Medicine

## 2015-11-29 ENCOUNTER — Encounter: Payer: Self-pay | Admitting: Internal Medicine

## 2015-11-29 VITALS — BP 112/70 | HR 77 | Temp 98.2°F | Wt 198.0 lb

## 2015-11-29 DIAGNOSIS — M159 Polyosteoarthritis, unspecified: Secondary | ICD-10-CM

## 2015-11-29 DIAGNOSIS — K279 Peptic ulcer, site unspecified, unspecified as acute or chronic, without hemorrhage or perforation: Secondary | ICD-10-CM | POA: Diagnosis not present

## 2015-11-29 MED ORDER — TRAMADOL HCL 50 MG PO TABS: 50.0000 mg | ORAL_TABLET | Freq: Three times a day (TID) | ORAL | 0 refills | Status: DC | PRN

## 2015-11-29 MED ORDER — PANTOPRAZOLE SODIUM 40 MG PO TBEC: 40.0000 mg | DELAYED_RELEASE_TABLET | Freq: Two times a day (BID) | ORAL | 3 refills | Status: DC

## 2015-11-29 MED ORDER — ACETAMINOPHEN ER 650 MG PO TBCR: 650.0000 mg | EXTENDED_RELEASE_TABLET | Freq: Three times a day (TID) | ORAL | 0 refills | Status: DC | PRN

## 2015-11-29 NOTE — Progress Notes (Signed)
Subjective:    Patient ID: Darin Engels, female    DOB: February 24, 1933, 82 y.o.   MRN: [id number]  HPI Here with husband due to pain  Having pain along both upper arms--points to triceps Also in back Stepped over groceries getting into the house--thinks it started then (about 10 days ago)---though this wouldn't have caused arm pain No falls Hasn't taken any pain meds for this Tried one of husband's tramadol--really helped  Still having problems with the peptic ulcer Some ongoing symptoms AM nausea--may improve but then may recur in evening Taking the protonix once a day now Never able to find out about the H pylori from [place]  Current Outpatient Prescriptions on File Prior to Visit  Medication Sig Dispense Refill  . furosemide (LASIX) 40 MG tablet TAKE 1 TABLET BY MOUTH EVERY DAY 90 tablet 3  . nadolol (CORGARD) 40 MG tablet TAKE 1 TABLET BY MOUTH EVERY DAY 90 tablet 3  . pantoprazole (PROTONIX) 40 MG tablet Take 40 mg by mouth daily.    . pravastatin (PRAVACHOL) 40 MG tablet Take 1 tablet (40 mg total) by mouth daily. 90 tablet 3   No current facility-administered medications on file prior to visit.     Allergies  Allergen Reactions  . Lipitor [Atorvastatin]     Dizzy ( fuzziness)  . Latex Rash    Past Medical History:  Diagnosis Date  . GERD (gastroesophageal reflux disease)   . Hx of lumbosacral spine surgery t  . Hyperlipidemia   . Hypertension   . Iliac artery aneurysm ([place])   . Lumbar spinal stenosis     Past Surgical History:  Procedure Laterality Date  . JOINT REPLACEMENT  1992   right hip replacement  . JOINT REPLACEMENT  2002   left hip replacement  . ROTATOR CUFF REPAIR  2002   Left shoulder  . SPINE SURGERY  2006 & 2010   posterior spinal fusion L4-5 L5S1    Family History  Problem Relation Age of Onset  . Heart disease Mother   . Heart disease Father     Heart Disease before age 35  . Heart attack Father   . Hyperlipidemia Father   .  Heart disease Sister   . Diabetes Sister   . Heart disease Brother   . Hyperlipidemia Brother   . Heart disease Brother   . Cancer Brother     Social History   Social History  . Marital status: Married    Spouse name: N/A  . Number of children: N/A  . Years of education: N/A   Occupational History  . Not on file.   Social History Main Topics  . Smoking status: Never Smoker  . Smokeless tobacco: Never Used  . Alcohol use No  . Drug use: No  . Sexual activity: Not on file   Other Topics Concern  . Not on file   Social History Narrative   Has living will   Husband is health care POA   Discussed DNR--she requests (done 11/30/12)   No tube feedings if cognitively unaware   Review of Systems  Appetite is okay--but sometimes has early satiety Weight is  Chronic left hip pain persists     Objective:   Physical Exam  Constitutional: She appears well-developed. No distress.  Musculoskeletal:  Marked crepitus in left shoulder--moderate in right Pain with abduction and rotation in both shoulders (mimics pain she has) Fair ROM in hips SLR negative  Neurological:  Antalgic gait due to  left hip          Assessment & Plan:

## 2015-11-29 NOTE — Telephone Encounter (Signed)
Patient Name: Sydney [name]  DOB: [birth year]    Initial Comment Caller States she is having leg pain, she would like to see her doctor    Nurse Assessment  Nurse: Raphael Gibney, RN, Vanita Ingles Date/Time (Eastern Time): 11/29/2015 9:12:18 AM  Confirm and document reason for call. If symptomatic, describe symptoms. You must click the next button to save text entered. ---Caller states she is having burning in her left groin that is going down her left leg. She is limping when she walks which is not unusual. No injury.  Has the patient traveled out of the country within the last 30 days? ---Not Applicable  Does the patient have any new or worsening symptoms? ---Yes  Will a triage be completed? ---Yes  Related visit to physician within the last 2 weeks? ---No  Does the PT have any chronic conditions? (i.e. diabetes, asthma, etc.) ---Yes  List chronic conditions. ---peptic ulcer  Is this a behavioral health or substance abuse call? ---No     Guidelines    Guideline Title Affirmed Question Affirmed Notes  Leg Pain [1] MODERATE pain (e.g., interferes with normal activities, limping) AND [2] present > 3 days    Final Disposition User   See PCP When Office is Open (within 3 days) Raphael Gibney, RN, Vanita Ingles    Comments  No appts available with Dr. Viviana Simpler within 72 hrs. Pt does not want to see another doctor. Please call pt back regarding appt.   Disagree/Comply: Comply

## 2015-11-29 NOTE — Telephone Encounter (Signed)
I spoke with pt and she only wants to see Dr Silvio Pate who does not have available appts this week; pt said to schedule on 12/03/15; pt has appt with Dr Silvio Pate on 12/03/15 at 3 pm. Pt will cb prior to appt if condition changes or worsens.FYI to Dr Silvio Pate.

## 2015-11-29 NOTE — Assessment & Plan Note (Signed)
Ongoing symptoms Will check H pylori and treat if positive

## 2015-11-29 NOTE — Telephone Encounter (Signed)
You had a cancellation at 3:30 today and I called and rescheduled her appointment to today.

## 2015-11-29 NOTE — Assessment & Plan Note (Signed)
Shoulders seem to be the worst. No evidence of acute worrisome injury Discussed regular tylenol Tramadol for prn

## 2015-11-29 NOTE — Telephone Encounter (Signed)
Perfect thanks

## 2015-11-29 NOTE — Progress Notes (Signed)
Pre visit review using our clinic review tool, if applicable. No additional management support is needed unless otherwise documented below in the visit note. 

## 2015-11-29 NOTE — Telephone Encounter (Signed)
Please see if she wants to get in tomorrow Can shorten my 12:15 to just 15 minutes and add her at 12:30 If she is okay with waiting till Monday--that would be better

## 2015-11-30 LAB — RENAL FUNCTION PANEL
Albumin: 4 g/dL (ref 3.5–5.2)
BUN: 20 mg/dL (ref 6–23)
CHLORIDE: 99 meq/L (ref 96–112)
CO2: 28 mEq/L (ref 19–32)
Calcium: 9.9 mg/dL (ref 8.4–10.5)
Creatinine, Ser: 1.24 mg/dL — ABNORMAL HIGH (ref 0.40–1.20)
GFR: 44.09 mL/min — ABNORMAL LOW (ref >60.00)
Glucose, Bld: 122 mg/dL — ABNORMAL HIGH (ref 70–99)
PHOSPHORUS: 2.8 mg/dL (ref 2.3–4.6)
POTASSIUM: 3.6 meq/L (ref 3.5–5.1)
SODIUM: 136 meq/L (ref 135–145)

## 2015-11-30 NOTE — Addendum Note (Signed)
Addended by: Ellamae Sia on: 11/30/2015 03:15 PM   Modules accepted: Orders

## 2015-11-30 NOTE — Addendum Note (Signed)
Addended by: Ellamae Sia on: 11/30/2015 03:13 PM   Modules accepted: Orders

## 2015-12-03 ENCOUNTER — Other Ambulatory Visit (INDEPENDENT_AMBULATORY_CARE_PROVIDER_SITE_OTHER): Payer: Medicare Other

## 2015-12-03 ENCOUNTER — Ambulatory Visit: Payer: Medicare Other | Admitting: Internal Medicine

## 2015-12-03 DIAGNOSIS — K279 Peptic ulcer, site unspecified, unspecified as acute or chronic, without hemorrhage or perforation: Secondary | ICD-10-CM

## 2015-12-03 LAB — H. PYLORI ANTIBODY, IGG: H PYLORI IGG: NEGATIVE

## 2015-12-27 ENCOUNTER — Emergency Department (HOSPITAL_COMMUNITY): Payer: Medicare Other

## 2015-12-27 ENCOUNTER — Encounter (HOSPITAL_COMMUNITY): Payer: Self-pay | Admitting: Emergency Medicine

## 2015-12-27 ENCOUNTER — Emergency Department (HOSPITAL_COMMUNITY)
Admission: EM | Admit: 2015-12-27 | Discharge: 2015-12-27 | Disposition: A | Discharge status: Home / Self Care | Payer: Medicare Other | Attending: Emergency Medicine | Admitting: Emergency Medicine

## 2015-12-27 DIAGNOSIS — R0902 Hypoxemia: Secondary | ICD-10-CM | POA: Diagnosis not present

## 2015-12-27 DIAGNOSIS — Z91040 Latex allergy status: Secondary | ICD-10-CM | POA: Diagnosis not present

## 2015-12-27 DIAGNOSIS — Y939 Activity, unspecified: Secondary | ICD-10-CM | POA: Insufficient documentation

## 2015-12-27 DIAGNOSIS — Z96643 Presence of artificial hip joint, bilateral: Secondary | ICD-10-CM | POA: Insufficient documentation

## 2015-12-27 DIAGNOSIS — Y999 Unspecified external cause status: Secondary | ICD-10-CM | POA: Diagnosis not present

## 2015-12-27 DIAGNOSIS — I10 Essential (primary) hypertension: Secondary | ICD-10-CM | POA: Diagnosis not present

## 2015-12-27 DIAGNOSIS — T84021A Dislocation of internal left hip prosthesis, initial encounter: Secondary | ICD-10-CM | POA: Diagnosis not present

## 2015-12-27 DIAGNOSIS — S73005A Unspecified dislocation of left hip, initial encounter: Secondary | ICD-10-CM | POA: Diagnosis not present

## 2015-12-27 DIAGNOSIS — X509XXA Other and unspecified overexertion or strenuous movements or postures, initial encounter: Secondary | ICD-10-CM | POA: Diagnosis not present

## 2015-12-27 DIAGNOSIS — S73006A Unspecified dislocation of unspecified hip, initial encounter: Secondary | ICD-10-CM | POA: Diagnosis not present

## 2015-12-27 DIAGNOSIS — Y929 Unspecified place or not applicable: Secondary | ICD-10-CM | POA: Insufficient documentation

## 2015-12-27 MED ORDER — PROPOFOL 10 MG/ML IV BOLUS
INTRAVENOUS | Status: AC | PRN
  Administered 2015-12-27: 20 mg via INTRAVENOUS
  Administered 2015-12-27: 4.3 mg via INTRAVENOUS

## 2015-12-27 MED ORDER — PROPOFOL 10 MG/ML IV BOLUS
0.5000 mg/kg | Freq: Once | INTRAVENOUS | Status: AC
  Administered 2015-12-27: 43.1 mg via INTRAVENOUS
  Filled 2015-12-27: qty 20

## 2015-12-27 MED ORDER — FENTANYL CITRATE (PF) 100 MCG/2ML IJ SOLN
50.0000 ug | Freq: Once | INTRAMUSCULAR | Status: AC
  Administered 2015-12-27: 50 ug via INTRAVENOUS
  Filled 2015-12-27: qty 2

## 2015-12-27 NOTE — ED Notes (Signed)
Ortho tech at bedside 

## 2015-12-27 NOTE — Progress Notes (Signed)
Orthopedic Tech Progress Note Patient Details:  Sydney [name] [birth year] [id number]  Ortho Devices Type of Ortho Device: Knee Immobilizer Ortho Device/Splint Interventions: Application   [name] 12/27/2015, 12:02 PM

## 2015-12-27 NOTE — ED Triage Notes (Signed)
Pt bent over this morning to dry off feet and hip dislocated out of place. Pt did not fall. Pt has hx of this after hip replacement. EMS has hip secured with sheet. Pt alert and oriented. BP 130/80, HR 60. EMS gave 45mcg fentanyl.

## 2015-12-27 NOTE — ED Notes (Signed)
Pt's family to drive pt home

## 2015-12-27 NOTE — Sedation Documentation (Addendum)
Md at bedside. Resp tech at bedside and time out performed. Procedure is beginning

## 2015-12-27 NOTE — ED Notes (Signed)
Pt placed on heart monitor, consent signed, CO2 monitor and oxygen on pt. Pt has running fluids in IV and is ready for procedure.

## 2015-12-27 NOTE — ED Provider Notes (Signed)
[facility] DEPT Provider Note   CSN: [id number] Arrival date & time: 12/27/15  [id number]     History   Chief Complaint Chief Complaint  Patient presents with  . Hip Pain    HPI Sydney [name] is a 82 y.o. female.  The history is provided by the patient.  Hip Pain  This is a recurrent problem. The current episode started less than 1 hour ago. The problem occurs rarely. The problem has not changed since onset.Pertinent negatives include no chest pain, no abdominal pain, no headaches and no shortness of breath. The symptoms are aggravated by bending, twisting and walking. Nothing relieves the symptoms. She has tried nothing for the symptoms.    History of bilateral hip replacements. Followed by Dr. Percell Miller from orthopedic surgery. Recent left hip dislocation status post reduction in the ED in April 2017. In usual state of health. This morning while bending over felt her left hip pop out. She did not fall, braced herself on the bathroom counter and was assisted to laying down. EMS called. Denies any other injuries, shortness of breath, chest pain, abdominal pain, back pain or neck pain. Denies any focal numbness or weakness.  Past Medical History:  Diagnosis Date  . GERD (gastroesophageal reflux disease)   . Hx of lumbosacral spine surgery t  . Hyperlipidemia   . Hypertension   . Iliac artery aneurysm ([place])   . Lumbar spinal stenosis     Patient Active Problem List   Diagnosis Date Noted  . Head congestion 10/25/2015  . Peptic ulcer 10/25/2015  . Hyperlipemia 07/23/2015  . Actinic keratosis 07/23/2015  . Generalized osteoarthritis of multiple sites 07/14/2014  . Advanced directives, counseling/discussion 12/02/2013  . Routine general medical examination at a health care facility 11/30/2012  . GERD (gastroesophageal reflux disease)   . Iliac artery aneurysm, right ([place]) 01/18/2011  . Obesity, unspecified 01/02/2009  . DEGENERATIVE DISC DISEASE, LUMBAR SPINE 01/02/2009  .  Essential hypertension, benign 01/14/2007  . DIVERTICULOSIS, COLON 12/15/2002    Past Surgical History:  Procedure Laterality Date  . JOINT REPLACEMENT  1992   right hip replacement  . JOINT REPLACEMENT  2002   left hip replacement  . ROTATOR CUFF REPAIR  2002   Left shoulder  . SPINE SURGERY  2006 & 2010   posterior spinal fusion L4-5 L5S1    OB History    No data available       Home Medications    Prior to Admission medications   Medication Sig Start Date End Date Taking? Authorizing Provider  furosemide (LASIX) 40 MG tablet TAKE 1 TABLET BY MOUTH EVERY DAY 10/04/15  Yes Venia Carbon, MD  nadolol (CORGARD) 40 MG tablet TAKE 1 TABLET BY MOUTH EVERY DAY 11/02/15  Yes Venia Carbon, MD  pravastatin (PRAVACHOL) 40 MG tablet Take 1 tablet (40 mg total) by mouth daily. 07/24/15  Yes Venia Carbon, MD  traMADol (ULTRAM) 50 MG tablet Take 1 tablet (50 mg total) by mouth 3 (three) times daily as needed. 11/29/15  Yes Venia Carbon, MD    Family History Family History  Problem Relation Age of Onset  . Heart disease Mother   . Heart disease Father     Heart Disease before age 46  . Heart attack Father   . Hyperlipidemia Father   . Heart disease Sister   . Diabetes Sister   . Heart disease Brother   . Hyperlipidemia Brother   . Heart disease Brother   .  Cancer Brother     Social History Social History  Substance Use Topics  . Smoking status: Never Smoker  . Smokeless tobacco: Never Used  . Alcohol use No     Allergies   Lipitor [atorvastatin] and Latex   Review of Systems Review of Systems  Respiratory: Negative for shortness of breath.   Cardiovascular: Negative for chest pain.  Gastrointestinal: Negative for abdominal pain.  Neurological: Negative for headaches.  All other systems reviewed and are negative.    Physical Exam Updated Vital Signs BP 131/67   Pulse (!) 50   Temp 97.7 F (36.5 C) (Oral)   Resp 14   Ht 5\' 7"  (1.702 m)   Wt  190 lb (86.2 kg)   SpO2 98%   BMI 29.76 kg/m   Physical Exam Physical Exam  Nursing note and vitals reviewed. Constitutional: Well developed, well nourished, non-toxic, and in no acute distress Head: Normocephalic and atraumatic.  Mouth/Throat: Oropharynx is clear and moist.  Neck: Normal range of motion. Neck supple.  Cardiovascular: Normal rate and regular rhythm.  +2 DP pulses bilaterally Pulmonary/Chest: Effort normal and breath sounds normal.  Abdominal: Soft. There is no tenderness. There is no rebound and no guarding.  Musculoskeletal: Deformity of the left hip with inability to ROM. Neurological: Alert, no facial droop, fluent speech, moves all extremities symmetrically, sensation to light touch in tact throughout Skin: Skin is warm and dry.  Psychiatric: Cooperative   ED Treatments / Results  Labs (all labs ordered are listed, but only abnormal results are displayed) Labs Reviewed - No data to display  EKG  EKG Interpretation None       Radiology Dg Hip Unilat W Or Wo Pelvis 1 View Left  Result Date: 12/27/2015 CLINICAL DATA:  Status post reduction of left hip dislocation today. Initial encounter. EXAM: DG HIP (WITH OR WITHOUT PELVIS) 1V*L* COMPARISON:  Plain films left hip 12/27/2015. FINDINGS: The patient's left hip prosthesis has been reduced. The femoral head is asymmetrically positioned in the acetabular cup compatible with wear of the lining of the cup. No fracture. IMPRESSION: Successful reduction of dislocation. Findings compatible with wear of the lining of the acetabular cup of the patient's left hip prosthesis. Electronically Signed   By: Inge Rise M.D.   On: 12/27/2015 12:50   Dg Hip Unilat W Or Wo Pelvis 2-3 Views Left  Result Date: 12/27/2015 CLINICAL DATA:  Left hip dislocation this morning EXAM: DG HIP (WITH OR WITHOUT PELVIS) 2-3V LEFT COMPARISON:  Left hip series of August 09, 2015 FINDINGS: The patient has sustained superior and posterior  displacement of the prosthetic left femoral head with respect to the acetabular cup. No acute native bone fracture is observed. IMPRESSION: Superior and posterior dislocation of the prosthetic left hip joint. Electronically Signed   By: David  Martinique M.D.   On: 12/27/2015 10:44    Procedures .Sedation Date/Time: 12/27/2015 11:03 AM Performed by: Brantley Stage DUO Authorized by: Brantley Stage DUO   Consent:    Consent obtained:  Verbal and written   Consent given by:  Patient   Risks discussed:  Allergic reaction, prolonged hypoxia resulting in organ damage, prolonged sedation necessitating reversal, inadequate sedation, respiratory compromise necessitating ventilatory assistance and intubation, dysrhythmia, nausea and vomiting Indications:    Procedure performed:  Dislocation reduction   Procedure necessitating sedation performed by:  Physician performing sedation   Intended level of sedation:  Moderate (conscious sedation) Pre-sedation assessment:    Time since last food or drink:  7PM one day prior   ASA classification: class 2 - patient with mild systemic disease     Neck mobility: normal     Mouth opening:  3 or more finger widths   Thyromental distance:  4 finger widths   Mallampati score:  I - soft palate, uvula, fauces, pillars visible   Pre-sedation assessments completed and reviewed: airway patency, cardiovascular function, hydration status, mental status, nausea/vomiting and pain level     History of difficult intubation: no     Pre-sedation assessment completed:  12/27/2015 11:04 AM Immediate pre-procedure details:    Reassessment: Patient reassessed immediately prior to procedure     Reviewed: vital signs, relevant labs/tests and NPO status     Verified: bag valve mask available, emergency equipment available, intubation equipment available and IV patency confirmed   Procedure details (see MAR for exact dosages):    Sedation start time:  12/27/2015 11:30 AM   Preoxygenation:  Nasal  cannula   Sedation:  Propofol (1 mg/kg)   Analgesia:  Fentanyl   Intra-procedure monitoring:  Blood pressure monitoring, continuous capnometry, cardiac monitor, continuous pulse oximetry, frequent LOC assessments and frequent vital sign checks   Intra-procedure events: none     Intra-procedure management:  Airway repositioning   Sedation end time:  12/27/2015 11:40 AM   Total sedation time (minutes):  10 Post-procedure details:    Post-sedation assessment completed:  12/27/2015 12:00 PM   Attendance: Constant attendance by certified staff until patient recovered     Recovery: Patient returned to pre-procedure baseline     Estimated blood loss (see I/O flowsheets): no     Post-sedation assessments completed and reviewed: airway patency, cardiovascular function, hydration status, mental status, nausea/vomiting, pain level, respiratory function and temperature     Specimens recovered:  None   Patient is stable for discharge or admission: yes     Patient tolerance:  Tolerated well, no immediate complications  Reduction of dislocation Date/Time: 12/27/2015 4:32 PM Performed by: Brantley Stage DUO Authorized by: Brantley Stage DUO  Consent: Verbal consent obtained. Risks and benefits: risks, benefits and alternatives were discussed Consent given by: patient Patient understanding: patient states understanding of the procedure being performed Patient consent: the patient's understanding of the procedure matches consent given Patient identity confirmed: verbally with patient and arm band Time out: Immediately prior to procedure a "time out" was called to verify the correct patient, procedure, equipment, support staff and site/side marked as required.  Sedation: Patient sedated: yes Sedatives: propofol Analgesia: fentanyl Sedation start date/time: 12/27/2015 11:30 AM Sedation end date/time: 12/27/2015 11:40 AM Vitals: Vital signs were monitored during sedation. Patient tolerance: Patient tolerated the  procedure well with no immediate complications    (including critical care time)  Medications Ordered in ED Medications  fentaNYL (SUBLIMAZE) injection 50 mcg (50 mcg Intravenous Given 12/27/15 0954)  propofol (DIPRIVAN) 10 mg/mL bolus/IV push 43.1 mg (43.1 mg Intravenous Given 12/27/15 1128)  propofol (DIPRIVAN) 10 mg/mL bolus/IV push (20 mg Intravenous Given 12/27/15 1135)     Initial Impression / Assessment and Plan / ED Course  I have reviewed the triage vital signs and the nursing notes.  Pertinent labs & imaging results that were available during my care of the patient were reviewed by me and considered in my medical decision making (see chart for details).  Clinical Course    82 year old female who presents with recurrent left hip dislocation. Extremities neurovascularly intact, and x-ray with dislocation. No other injuries and no fall history. Hip is reduced  under conscious sedation at bedside with 1 mg/kg dose of propofol. X-ray confirms reduction, and reassessment reveals neurovascularly intact extremity. Placed in knee immobilizer. Patient with stable vital signs and baseline mental status prior to discharge. She will follow-up with her orthopedic surgeon, Dr. Percell Miller.  Final Clinical Impressions(s) / ED Diagnoses   Final diagnoses:  Hip dislocation, left, initial encounter [facility])    New Prescriptions Discharge Medication List as of 12/27/2015  1:38 PM       Forde Dandy, MD 12/27/15 [phone]

## 2015-12-27 NOTE — ED Notes (Signed)
RN called pt's family to notify them that pt is being discharged. Pt is alert and oriented and acknowledges dc instructions.

## 2015-12-27 NOTE — ED Notes (Signed)
Called ortho tech to place knee immobilizer

## 2015-12-27 NOTE — Discharge Instructions (Signed)
Your hip was successfully reduced. Please wear knee immobilizer at all times. Please follow-up with your orthopedic surgeon, Dr. Percell Miller for close follow-up. Return without fail for worsening symptoms, including redislocation, new numbness or weakness, or any other symptoms concerning to you.

## 2015-12-27 NOTE — ED Notes (Signed)
Successful reduction

## 2015-12-27 NOTE — ED Notes (Addendum)
Pt sleepy, but fully alert/oriented and able to follow commands. No pain at this time. RN called for portable xray

## 2015-12-27 NOTE — ED Notes (Signed)
Knee immobilizer placed by ortho tech. Pt still sedated. MD and RN at bedside. Pt airway patent.

## 2015-12-27 NOTE — ED Notes (Signed)
Pt returned from x-ray and placed back on monitor.  

## 2016-01-03 ENCOUNTER — Ambulatory Visit (INDEPENDENT_AMBULATORY_CARE_PROVIDER_SITE_OTHER): Payer: Medicare Other

## 2016-01-03 DIAGNOSIS — Z23 Encounter for immunization: Secondary | ICD-10-CM

## 2016-01-24 ENCOUNTER — Encounter: Payer: Self-pay | Admitting: Family

## 2016-01-29 ENCOUNTER — Ambulatory Visit (INDEPENDENT_AMBULATORY_CARE_PROVIDER_SITE_OTHER): Payer: Medicare Other | Admitting: Family

## 2016-01-29 ENCOUNTER — Encounter: Payer: Self-pay | Admitting: Family

## 2016-01-29 ENCOUNTER — Inpatient Hospital Stay (HOSPITAL_COMMUNITY)
Admission: RE | Admit: 2016-01-29 | Discharge: 2016-01-29 | Disposition: A | Discharge status: Home / Self Care | Payer: Medicare Other | Source: Ambulatory Visit | Attending: Family | Admitting: Family

## 2016-01-29 ENCOUNTER — Ambulatory Visit (HOSPITAL_COMMUNITY)
Admission: RE | Admit: 2016-01-29 | Discharge: 2016-01-29 | Disposition: A | Discharge status: Home / Self Care | Payer: Medicare Other | Source: Ambulatory Visit | Attending: Family | Admitting: Family

## 2016-01-29 VITALS — BP 121/75 | HR 60 | Temp 99.1°F | Resp 18 | Ht 67.0 in | Wt 195.0 lb

## 2016-01-29 DIAGNOSIS — I723 Aneurysm of iliac artery: Secondary | ICD-10-CM

## 2016-01-29 DIAGNOSIS — R0989 Other specified symptoms and signs involving the circulatory and respiratory systems: Secondary | ICD-10-CM

## 2016-01-29 NOTE — Progress Notes (Signed)
[facility]   CC: Follow up Right Iliac Artery Aneurysm  History of Present Illness  Sydney [name] is a 82 y.o. (08-05-1932) female patient of Dr. Donnetta Hutching who presents today for followup of her incidentally found iliac artery aneurysm. She underwent CT scan for degenerative disc disease and was found to have a 2 cm right common iliac artery aneurysm.  Her risk factors for aneurysmal growth are not the following: is not diabetic, does not use tobacco, blood pressure is well controlled, is not sedentary, takes a 81 mg ASA qod .  Patient denies abdominal pain or new back pain.  She does have a history of lumbar surgery and chronic back problems with standing for long periods.  Pt smoker: non-smoker, never smoked. The patient denies claudication in legs with walking. The patient denies history of stroke or TIA symptoms.  Pt Diabetic: No Pt smoker: non-smoker   Pt meds include: Statin :yes Betablocker: Yes ASA: Yes Other anticoagulants/antiplatelets: no   Past Medical History:  Diagnosis Date  . GERD (gastroesophageal reflux disease)   . Hx of lumbosacral spine surgery t  . Hyperlipidemia   . Hypertension   . Iliac artery aneurysm ([place])   . Lumbar spinal stenosis    Past Surgical History:  Procedure Laterality Date  . JOINT REPLACEMENT  1992   right hip replacement  . JOINT REPLACEMENT  2002   left hip replacement  . ROTATOR CUFF REPAIR  2002   Left shoulder  . SPINE SURGERY  2006 & 2010   posterior spinal fusion L4-5 L5S1   Social History Social History   Social History  . Marital status: Married    Spouse name: N/A  . Number of children: N/A  . Years of education: N/A   Occupational History  . Not on file.   Social History Main Topics  . Smoking status: Never Smoker  . Smokeless tobacco: Never Used  . Alcohol use No  . Drug use: No  . Sexual activity: Not on file   Other Topics Concern  . Not on file   Social History  Narrative   Has living will   Husband is health care POA   Discussed DNR--she requests (done 11/30/12)   No tube feedings if cognitively unaware   Family History Family History  Problem Relation Age of Onset  . Heart disease Mother   . Heart disease Father     Heart Disease before age 39  . Heart attack Father   . Hyperlipidemia Father   . Heart disease Sister   . Diabetes Sister   . Heart disease Brother   . Hyperlipidemia Brother   . Heart disease Brother   . Cancer Brother     Current Outpatient Prescriptions on File Prior to Visit  Medication Sig Dispense Refill  . nadolol (CORGARD) 40 MG tablet TAKE 1 TABLET BY MOUTH EVERY DAY 90 tablet 3  . pravastatin (PRAVACHOL) 40 MG tablet Take 1 tablet (40 mg total) by mouth daily. 90 tablet 3  . traMADol (ULTRAM) 50 MG tablet Take 1 tablet (50 mg total) by mouth 3 (three) times daily as needed. 40 tablet 0   No current facility-administered medications on file prior to visit.    Allergies  Allergen Reactions  . Lipitor [Atorvastatin]     Dizzy ( fuzziness)  . Latex Rash    ROS: See HPI for pertinent positives and negatives.  Physical Examination  Vitals:   01/29/16 0943  BP: 121/75  Pulse: 60  Resp: 18  Temp: 99.1 F (37.3 C)  TempSrc: Oral  SpO2: 97%  Weight: 195 lb (88.5 kg)  Height: 5\' 7"  (1.702 m)   Body mass index is 30.54 kg/m.  General: A&O x 3, WD, obese female, walks with a cane and a limp.  Pulmonary: Sym exp, respirations are non labored, good air movt, CTAB, no rales, rhonchi, or wheezing.  Cardiac: RRR, Nl S1, S2, no murmur detected.   Carotid Bruits  Left  Right    Negative  Negative   VASCULAR EXAM:  LE Pulses  LEFT  RIGHT   FEMORAL  1+palpable  1+ palpable   POPLITEAL  3+palpable  2+palpable   POSTERIOR TIBIAL  Not palpable  2+palpable   DORSALIS PEDIS  ANTERIOR TIBIAL  2+palpable  2+palpable   AORTA  Not palpable  N/A    Gastrointestinal: soft, NTND, -G/R,  - HSM, - masses palpated, - CVAT B.  Musculoskeletal: M/S 5/5 in upper extremities, 4/5 in LE's, Extremities without ischemic changes, no peripheral edema.  Neurologic: CN 2-12 intact, Pain and light touch intact in extremities, Motor exam as    listed above.   Non-Invasive Vascular Imaging  Right common iliac artery Duplex (01/29/2016)  Previous size: 2.1 x 1.8 cm (Date: 01/23/15)  Current size:  1.86 cm x 1.91 cm (Date: 01/29/16). No evidence of AAA. Left CIA at 1.35 cm x 1.38 cm.  Medical Decision Making  The patient is a 82 y.o. female who presents with asymptomatic right CIA aneurysm with no increase in size.  Prominent popliteal pulses: Bilateral popliteal artery duplex on 01/26/15 demonstrates no evidence of popliteal aneurysms.    Based on this patient's exam and diagnostic studies, the patient will follow up in 1 year  with the following studies: bilateral aortoiliac duplex.  Consideration for repair of a common iliac artery aneurysm would be made when the size is 3.0 cm, growth > 1 cm/yr, and symptomatic status.  I emphasized the importance of maximal medical management including strict control of blood pressure, blood glucose, and lipid levels, antiplatelet agents, obtaining regular exercise, and continued cessation of smoking.   The patient was given information about AAA including signs, symptoms, treatment, and how to minimize the risk of enlargement and rupture of aneurysms.    The patient was advised to call 911 should the patient experience sudden onset abdominal or back pain.   Thank you for allowing [place] to participate in this patient's care.  Clemon Chambers, RN, MSN, FNP-C [facility] Office: [phone]  Clinic Physician: Early  01/29/2016, 10:00 AM

## 2016-02-19 ENCOUNTER — Encounter: Payer: Self-pay | Admitting: Internal Medicine

## 2016-02-19 ENCOUNTER — Ambulatory Visit (INDEPENDENT_AMBULATORY_CARE_PROVIDER_SITE_OTHER): Payer: Medicare Other | Admitting: Internal Medicine

## 2016-02-19 VITALS — BP 114/86 | HR 61 | Temp 97.4°F | Wt 196.0 lb

## 2016-02-19 DIAGNOSIS — N3000 Acute cystitis without hematuria: Secondary | ICD-10-CM | POA: Insufficient documentation

## 2016-02-19 LAB — POC URINALSYSI DIPSTICK (AUTOMATED)
Bilirubin, UA: NEGATIVE
GLUCOSE UA: NEGATIVE
Ketones, UA: NEGATIVE
NITRITE UA: NEGATIVE
PH UA: 6
Protein, UA: NEGATIVE
RBC UA: NEGATIVE
UROBILINOGEN UA: 0.2

## 2016-02-19 MED ORDER — SULFAMETHOXAZOLE-TRIMETHOPRIM 800-160 MG PO TABS: 1.0000 | ORAL_TABLET | Freq: Two times a day (BID) | ORAL | 0 refills | Status: DC

## 2016-02-19 NOTE — Progress Notes (Signed)
Pre visit review using our clinic review tool, if applicable. No additional management support is needed unless otherwise documented below in the visit note. 

## 2016-02-19 NOTE — Addendum Note (Signed)
Addended by: Pilar Grammes on: 02/19/2016 10:37 AM   Modules accepted: Orders

## 2016-02-19 NOTE — Assessment & Plan Note (Signed)
She doesn't note vulvar lesions or vaginal discharge Urinalysis shows 3+ leukocytes Will try 3 days empiric antibiotics Recheck here or gyn if ongoing symptoms

## 2016-02-19 NOTE — Progress Notes (Signed)
   Subjective:    Patient ID: Sydney [name], female    DOB: 06-14-1932, 82 y.o.   MRN: [id number]  HPI Here due to urinary symptoms Burning dysuria for 2 weeks Has been using topical benedryl and vagisil  No blood in urine--but does have some local irritation No fever No back pain No urgency--though has to go frequently after the furosemide  Current Outpatient Prescriptions on File Prior to Visit  Medication Sig Dispense Refill  . furosemide (LASIX) 20 MG tablet     . nadolol (CORGARD) 40 MG tablet TAKE 1 TABLET BY MOUTH EVERY DAY 90 tablet 3  . pantoprazole (PROTONIX) 40 MG tablet     . pravastatin (PRAVACHOL) 40 MG tablet Take 1 tablet (40 mg total) by mouth daily. 90 tablet 3   No current facility-administered medications on file prior to visit.     Allergies  Allergen Reactions  . Lipitor [Atorvastatin]     Dizzy ( fuzziness)  . Latex Rash    Past Medical History:  Diagnosis Date  . GERD (gastroesophageal reflux disease)   . Hx of lumbosacral spine surgery t  . Hyperlipidemia   . Hypertension   . Iliac artery aneurysm ([place])   . Lumbar spinal stenosis     Past Surgical History:  Procedure Laterality Date  . JOINT REPLACEMENT  1992   right hip replacement  . JOINT REPLACEMENT  2002   left hip replacement  . ROTATOR CUFF REPAIR  2002   Left shoulder  . SPINE SURGERY  2006 & 2010   posterior spinal fusion L4-5 L5S1    Family History  Problem Relation Age of Onset  . Heart disease Mother   . Heart disease Father     Heart Disease before age 16  . Heart attack Father   . Hyperlipidemia Father   . Heart disease Sister   . Diabetes Sister   . Heart disease Brother   . Hyperlipidemia Brother   . Heart disease Brother   . Cancer Brother     Social History   Social History  . Marital status: Married    Spouse name: N/A  . Number of children: N/A  . Years of education: N/A   Occupational History  . Not on file.   Social History Main Topics  .  Smoking status: Never Smoker  . Smokeless tobacco: Never Used  . Alcohol use No  . Drug use: No  . Sexual activity: Not on file   Other Topics Concern  . Not on file   Social History Narrative   Has living will   Husband is health care POA   Discussed DNR--she requests (done 11/30/12)   No tube feedings if cognitively unaware   Review of Systems No vaginal discharge No N/V Appetite is okay    Objective:   Physical Exam  Constitutional: She appears well-nourished. No distress.  Abdominal: Soft. She exhibits no distension. There is no tenderness. There is no rebound and no guarding.  Musculoskeletal:  No CVA tenderness          Assessment & Plan:

## 2016-02-26 ENCOUNTER — Ambulatory Visit (INDEPENDENT_AMBULATORY_CARE_PROVIDER_SITE_OTHER): Payer: Medicare Other | Admitting: Primary Care

## 2016-02-26 ENCOUNTER — Encounter: Payer: Self-pay | Admitting: Primary Care

## 2016-02-26 VITALS — BP 124/80 | HR 58 | Temp 97.5°F | Ht 67.0 in | Wt 196.8 lb

## 2016-02-26 DIAGNOSIS — R300 Dysuria: Secondary | ICD-10-CM

## 2016-02-26 DIAGNOSIS — B3731 Acute candidiasis of vulva and vagina: Secondary | ICD-10-CM

## 2016-02-26 DIAGNOSIS — B373 Candidiasis of vulva and vagina: Secondary | ICD-10-CM

## 2016-02-26 LAB — POC URINALSYSI DIPSTICK (AUTOMATED)
BILIRUBIN UA: NEGATIVE
Blood, UA: NEGATIVE
Glucose, UA: NEGATIVE
KETONES UA: NEGATIVE
NITRITE UA: NEGATIVE
PROTEIN UA: NEGATIVE
Spec Grav, UA: >=1.03
Urobilinogen, UA: NEGATIVE
pH, UA: 5

## 2016-02-26 LAB — POCT WET PREP WITH KOH: Trichomonas, UA: NEGATIVE

## 2016-02-26 MED ORDER — FLUCONAZOLE 150 MG PO TABS: ORAL_TABLET | ORAL | 0 refills | Status: DC

## 2016-02-26 NOTE — Progress Notes (Signed)
Subjective:    Patient ID: Sydney [name], female    DOB: November 22, 1932, 82 y.o.   MRN: [id number]  HPI  [name] is an 82 year old female who presents today with a chief complaint dysuria. She also repots vaginal itching and burning. She was evaluated and treated for urinary tract infection on 02/19/16. She was provided with Bactrim DS tablets for a 3 day course.   Since her visit last week she noticed a temporary improvement in her symptoms while on Bactrim, but her symptoms returned after she completed treatment. Her most bothersome symptom is burning and itching to the external vagina after urination. She denies pelvic pain, abdominal pain, hematuria, vaginal bleeding, nausea, flank pain, vaginal discharge.   Review of Systems  Constitutional: Negative for fever.  Genitourinary: Negative for dysuria, flank pain, frequency, hematuria, pelvic pain and vaginal discharge.       Vaginal itching and burning  Neurological: Negative for weakness.       Past Medical History:  Diagnosis Date  . GERD (gastroesophageal reflux disease)   . Hx of lumbosacral spine surgery t  . Hyperlipidemia   . Hypertension   . Iliac artery aneurysm ([place])   . Lumbar spinal stenosis      Social History   Social History  . Marital status: Married    Spouse name: N/A  . Number of children: N/A  . Years of education: N/A   Occupational History  . Not on file.   Social History Main Topics  . Smoking status: Never Smoker  . Smokeless tobacco: Never Used  . Alcohol use No  . Drug use: No  . Sexual activity: Not on file   Other Topics Concern  . Not on file   Social History Narrative   Has living will   Husband is health care POA   Discussed DNR--she requests (done 11/30/12)   No tube feedings if cognitively unaware    Past Surgical History:  Procedure Laterality Date  . JOINT REPLACEMENT  1992   right hip replacement  . JOINT REPLACEMENT  2002   left hip replacement  . ROTATOR CUFF REPAIR   2002   Left shoulder  . SPINE SURGERY  2006 & 2010   posterior spinal fusion L4-5 L5S1    Family History  Problem Relation Age of Onset  . Heart disease Mother   . Heart disease Father     Heart Disease before age 88  . Heart attack Father   . Hyperlipidemia Father   . Heart disease Sister   . Diabetes Sister   . Heart disease Brother   . Hyperlipidemia Brother   . Heart disease Brother   . Cancer Brother     Allergies  Allergen Reactions  . Lipitor [Atorvastatin]     Dizzy ( fuzziness)  . Latex Rash    Current Outpatient Prescriptions on File Prior to Visit  Medication Sig Dispense Refill  . furosemide (LASIX) 20 MG tablet     . nadolol (CORGARD) 40 MG tablet TAKE 1 TABLET BY MOUTH EVERY DAY 90 tablet 3  . pantoprazole (PROTONIX) 40 MG tablet     . pravastatin (PRAVACHOL) 40 MG tablet Take 1 tablet (40 mg total) by mouth daily. 90 tablet 3   No current facility-administered medications on file prior to visit.     BP 124/80   Pulse (!) 58   Temp 97.5 F (36.4 C) (Oral)   Ht 5\' 7"  (1.702 m)   Wt 196  lb 12.8 oz (89.3 kg)   SpO2 97%   BMI 30.82 kg/m    Objective:   Physical Exam  Constitutional: She appears well-nourished. She does not appear ill.  Cardiovascular: Normal rate.   Pulmonary/Chest: Effort normal.  Abdominal: Soft. Bowel sounds are normal. There is no tenderness. There is no CVA tenderness.  Genitourinary: There is no lesion on the right labia. There is no lesion on the left labia. Cervix exhibits discharge. Cervix exhibits no motion tenderness. There is erythema and tenderness in the vagina. No vaginal discharge found.  Genitourinary Comments: Mild whitish discharge          Assessment & Plan:  Vaginal yeast infection:  Vaginal burning and itching for the past several weeks. Diagnosed and treated for UTI last week, urinary symptoms have improved. Exam today with moderate erythema to external vagina, mild whitish discharge internally. Wet  prep today with moderate yeast, negative clue cells. UA: Trace leuks, negative blood and nitrites. Given presence of yeast on wet prep coupled with recent antibiotic use will treat for vaginal yeast infection. Prescription for Diflucan sent to pharmacy, may repeat was second tablet in 3 days if no resolved. Will have her continue to increase consumption of water Follow-up as needed.  Sheral Flow, NP

## 2016-02-26 NOTE — Patient Instructions (Signed)
Your symptoms are representative of a vaginal yeast infection.  Start fluconazole (Diflucan) 150 mg. Take 1 tablet by mouth once now. May repeat in 3 days if no resolve in symptoms.  Your urinary tract infection has resolved.  It was a pleasure meeting you!

## 2016-02-26 NOTE — Progress Notes (Signed)
Pre visit review using our clinic review tool, if applicable. No additional management support is needed unless otherwise documented below in the visit note. 

## 2016-03-11 ENCOUNTER — Telehealth: Payer: Self-pay | Admitting: *Deleted

## 2016-03-11 DIAGNOSIS — B3731 Acute candidiasis of vulva and vagina: Secondary | ICD-10-CM

## 2016-03-11 DIAGNOSIS — B373 Candidiasis of vulva and vagina: Secondary | ICD-10-CM

## 2016-03-11 MED ORDER — FLUCONAZOLE 150 MG PO TABS: ORAL_TABLET | ORAL | 0 refills | Status: DC

## 2016-03-11 NOTE — Telephone Encounter (Signed)
Please notify patient that I sent 1 additional fluconazole tablet to her pharmacy. If her symptoms persist after this tablet then she'll need re-evaluation.

## 2016-03-11 NOTE — Telephone Encounter (Signed)
PT came in with husband to his appt today (03/11/16). She stated she was still having some issues with the yeast infection and wanted to know if Anda Kraft could call in the medication she took again. Please call her [phone].

## 2016-03-11 NOTE — Telephone Encounter (Signed)
Spoken and notified patient of Kate's comments. Patient verbalized understanding. 

## 2016-06-12 DIAGNOSIS — M353 Polymyalgia rheumatica: Secondary | ICD-10-CM

## 2016-06-12 HISTORY — DX: Polymyalgia rheumatica: M35.3

## 2016-07-02 ENCOUNTER — Encounter (INDEPENDENT_AMBULATORY_CARE_PROVIDER_SITE_OTHER): Payer: Self-pay

## 2016-07-02 ENCOUNTER — Encounter: Payer: Self-pay | Admitting: Internal Medicine

## 2016-07-02 ENCOUNTER — Ambulatory Visit (INDEPENDENT_AMBULATORY_CARE_PROVIDER_SITE_OTHER): Payer: Medicare Other | Admitting: Internal Medicine

## 2016-07-02 VITALS — BP 118/76 | HR 60 | Temp 97.6°F | Wt 194.0 lb

## 2016-07-02 DIAGNOSIS — E785 Hyperlipidemia, unspecified: Secondary | ICD-10-CM

## 2016-07-02 DIAGNOSIS — M25512 Pain in left shoulder: Secondary | ICD-10-CM

## 2016-07-02 DIAGNOSIS — M25511 Pain in right shoulder: Secondary | ICD-10-CM | POA: Diagnosis not present

## 2016-07-02 DIAGNOSIS — N950 Postmenopausal bleeding: Secondary | ICD-10-CM

## 2016-07-02 LAB — CBC WITH DIFFERENTIAL/PLATELET
Basophils Absolute: 0 10*3/uL (ref 0.0–0.1)
Basophils Relative: 0.4 % (ref 0.0–3.0)
EOS PCT: 2.1 % (ref 0.0–5.0)
Eosinophils Absolute: 0.2 10*3/uL (ref 0.0–0.7)
HCT: 42.5 % (ref 36.0–46.0)
HEMOGLOBIN: 13.9 g/dL (ref 12.0–15.0)
Lymphocytes Relative: 16.6 % (ref 12.0–46.0)
Lymphs Abs: 1.5 10*3/uL (ref 0.7–4.0)
MCHC: 32.7 g/dL (ref 30.0–36.0)
MCV: 88.9 fl (ref 78.0–100.0)
MONO ABS: 1 10*3/uL (ref 0.1–1.0)
Monocytes Relative: 10.7 % (ref 3.0–12.0)
NEUTROS PCT: 70.2 % (ref 43.0–77.0)
Neutro Abs: 6.3 10*3/uL (ref 1.4–7.7)
Platelets: 286 10*3/uL (ref 150.0–400.0)
RBC: 4.78 Mil/uL (ref 3.87–5.11)
RDW: 13.9 % (ref 11.5–15.5)
WBC: 9 10*3/uL (ref 4.0–10.5)

## 2016-07-02 LAB — COMPREHENSIVE METABOLIC PANEL
ALBUMIN: 4 g/dL (ref 3.5–5.2)
ALK PHOS: 63 U/L (ref 39–117)
ALT: 11 U/L (ref 0–35)
AST: 13 U/L (ref 0–37)
BUN: 27 mg/dL — ABNORMAL HIGH (ref 6–23)
CO2: 29 mEq/L (ref 19–32)
Calcium: 10.2 mg/dL (ref 8.4–10.5)
Chloride: 104 mEq/L (ref 96–112)
Creatinine, Ser: 1.25 mg/dL — ABNORMAL HIGH (ref 0.40–1.20)
GFR: 43.62 mL/min — ABNORMAL LOW (ref >60.00)
Glucose, Bld: 117 mg/dL — ABNORMAL HIGH (ref 70–99)
POTASSIUM: 4.6 meq/L (ref 3.5–5.1)
Sodium: 141 mEq/L (ref 135–145)
TOTAL PROTEIN: 7.6 g/dL (ref 6.0–8.3)
Total Bilirubin: 0.6 mg/dL (ref 0.2–1.2)

## 2016-07-02 LAB — LIPID PANEL
Cholesterol: 193 mg/dL (ref 0–200)
HDL: 52.4 mg/dL (ref >39.00)
LDL Cholesterol: 120 mg/dL — ABNORMAL HIGH (ref 0–99)
NONHDL: 140.77
Total CHOL/HDL Ratio: 4
Triglycerides: 103 mg/dL (ref 0.0–149.0)
VLDL: 20.6 mg/dL (ref 0.0–40.0)

## 2016-07-02 LAB — T4, FREE: FREE T4: 1.05 ng/dL (ref 0.60–1.60)

## 2016-07-02 LAB — SEDIMENTATION RATE: Sed Rate: 53 mm/hr — ABNORMAL HIGH (ref 0–30)

## 2016-07-02 NOTE — Assessment & Plan Note (Signed)
I doubt this is PMR but will check sed rate Takes aleve for pain and that helps some (but not much) Discussed adding regular tylenol (had only been intermittent before)

## 2016-07-02 NOTE — Progress Notes (Signed)
   Subjective:    Patient ID: Sydney [name], female    DOB: [birth year], 83 y.o.   MRN: [id number]  HPI Here due to ongoing joint pains Still having right shoulder pain---despite biofreeze  Now with more problems in left shoulder---trouble even lifting the arm up Has pain in the neck also Morning stiffness Read article about PMR---and thinks that describes her symptoms No sig hip girdle pain though  No fever No other sig pain  [place] a few weeks ago--toe got hit in pants [place] onto chair Hit chest and lower abdomen Had vaginal bleeding about a week later---clots of blood No pain  Current Outpatient Prescriptions on File Prior to Visit  Medication Sig Dispense Refill  . furosemide (LASIX) 20 MG tablet     . nadolol (CORGARD) 40 MG tablet TAKE 1 TABLET BY MOUTH EVERY DAY 90 tablet 3  . pantoprazole (PROTONIX) 40 MG tablet     . pravastatin (PRAVACHOL) 40 MG tablet Take 1 tablet (40 mg total) by mouth daily. 90 tablet 3   No current facility-administered medications on file prior to visit.     Allergies  Allergen Reactions  . Lipitor [Atorvastatin]     Dizzy ( fuzziness)  . Latex Rash    Past Medical History:  Diagnosis Date  . GERD (gastroesophageal reflux disease)   . Hx of lumbosacral spine surgery t  . Hyperlipidemia   . Hypertension   . Iliac artery aneurysm ([place])   . Lumbar spinal stenosis     Past Surgical History:  Procedure Laterality Date  . JOINT REPLACEMENT  1992   right hip replacement  . JOINT REPLACEMENT  2002   left hip replacement  . ROTATOR CUFF REPAIR  2002   Left shoulder  . SPINE SURGERY  2006 & 2010   posterior spinal fusion L4-5 L5S1    Family History  Problem Relation Age of Onset  . Heart disease Mother   . Heart disease Father     Heart Disease before age 93  . Heart attack Father   . Hyperlipidemia Father   . Heart disease Sister   . Diabetes Sister   . Heart disease Brother   . Hyperlipidemia Brother   . Heart disease  Brother   . Cancer Brother     Social History   Social History  . Marital status: Married    Spouse name: N/A  . Number of children: N/A  . Years of education: N/A   Occupational History  . Not on file.   Social History Main Topics  . Smoking status: Never Smoker  . Smokeless tobacco: Never Used  . Alcohol use No  . Drug use: No  . Sexual activity: Not on file   Other Topics Concern  . Not on file   Social History Narrative   Has living will   Husband is health care POA   Discussed DNR--she requests (done 11/30/12)   No tube feedings if cognitively unaware   Review of Systems No headaches No unilateral vision loss Appetite is fine    Objective:   Physical Exam  Neck:  No severe ROM restrictions  Abdominal: Soft. She exhibits no distension. There is no tenderness. There is no rebound and no guarding.  Musculoskeletal:  Partially frozen right shoulder---decreased ext rotation especially Bad arthritic left shoulder---limited ROM with severe crepitus Tenderness/tightness in left>right trapezius          Assessment & Plan:

## 2016-07-02 NOTE — Progress Notes (Signed)
Pre visit review using our clinic review tool, if applicable. No additional management support is needed unless otherwise documented below in the visit note. 

## 2016-07-02 NOTE — Assessment & Plan Note (Signed)
Will check labs now with BW

## 2016-07-02 NOTE — Assessment & Plan Note (Signed)
After trauma Benign internal exam in fall Will just check ultrasound

## 2016-07-03 ENCOUNTER — Telehealth: Payer: Self-pay

## 2016-07-03 MED ORDER — PREDNISONE 20 MG PO TABS: 20.0000 mg | ORAL_TABLET | Freq: Every day | ORAL | 0 refills | Status: DC

## 2016-07-03 NOTE — Telephone Encounter (Signed)
Per lab results. Prednisone sent in

## 2016-07-08 ENCOUNTER — Ambulatory Visit
Admission: RE | Admit: 2016-07-08 | Discharge: 2016-07-08 | Disposition: A | Discharge status: Home / Self Care | Payer: Medicare Other | Source: Ambulatory Visit | Attending: Internal Medicine | Admitting: Internal Medicine

## 2016-07-08 DIAGNOSIS — N950 Postmenopausal bleeding: Secondary | ICD-10-CM

## 2016-07-09 ENCOUNTER — Other Ambulatory Visit: Payer: Self-pay | Admitting: Internal Medicine

## 2016-07-09 DIAGNOSIS — N950 Postmenopausal bleeding: Secondary | ICD-10-CM

## 2016-07-17 ENCOUNTER — Encounter: Payer: Self-pay | Admitting: Internal Medicine

## 2016-07-17 ENCOUNTER — Ambulatory Visit (INDEPENDENT_AMBULATORY_CARE_PROVIDER_SITE_OTHER): Payer: Medicare Other | Admitting: Internal Medicine

## 2016-07-17 VITALS — BP 124/80 | HR 57 | Ht 67.0 in | Wt 192.0 lb

## 2016-07-17 DIAGNOSIS — M353 Polymyalgia rheumatica: Secondary | ICD-10-CM

## 2016-07-17 DIAGNOSIS — I723 Aneurysm of iliac artery: Secondary | ICD-10-CM

## 2016-07-17 DIAGNOSIS — N950 Postmenopausal bleeding: Secondary | ICD-10-CM | POA: Diagnosis not present

## 2016-07-17 DIAGNOSIS — Z9889 Other specified postprocedural states: Secondary | ICD-10-CM

## 2016-07-17 HISTORY — DX: Other specified postprocedural states: Z98.890

## 2016-07-17 MED ORDER — PREDNISONE 10 MG PO TABS: 20.0000 mg | ORAL_TABLET | Freq: Every day | ORAL | 3 refills | Status: DC

## 2016-07-17 NOTE — Progress Notes (Signed)
Pre visit review using our clinic review tool, if applicable. No additional management support is needed unless otherwise documented below in the visit note. 

## 2016-07-17 NOTE — Assessment & Plan Note (Signed)
Has yearly follow up with vascular surgery

## 2016-07-17 NOTE — Progress Notes (Signed)
   Subjective:    Patient ID: Sydney [name], female    DOB: 28-Oct-1932, 83 y.o.   MRN: [id number]  HPI Here for follow up of apparent PMR  "that is a miracle drug" Wasn't even able to sleep in bed on shoulders---and pain went away with the first dose No hip pain No problems tolerating prednisone  Current Outpatient Prescriptions on File Prior to Visit  Medication Sig Dispense Refill  . furosemide (LASIX) 20 MG tablet     . nadolol (CORGARD) 40 MG tablet TAKE 1 TABLET BY MOUTH EVERY DAY 90 tablet 3  . pantoprazole (PROTONIX) 40 MG tablet     . pravastatin (PRAVACHOL) 40 MG tablet Take 1 tablet (40 mg total) by mouth daily. 90 tablet 3  . predniSONE (DELTASONE) 20 MG tablet Take 1 tablet (20 mg total) by mouth daily with breakfast. 20 tablet 0   No current facility-administered medications on file prior to visit.     Allergies  Allergen Reactions  . Lipitor [Atorvastatin]     Dizzy ( fuzziness)  . Latex Rash    Past Medical History:  Diagnosis Date  . GERD (gastroesophageal reflux disease)   . Hx of lumbosacral spine surgery t  . Hyperlipidemia   . Hypertension   . Iliac artery aneurysm ([place])   . Lumbar spinal stenosis   . Polymyalgia rheumatica ([place]) 06/2016    Past Surgical History:  Procedure Laterality Date  . JOINT REPLACEMENT  1992   right hip replacement  . JOINT REPLACEMENT  2002   left hip replacement  . ROTATOR CUFF REPAIR  2002   Left shoulder  . SPINE SURGERY  2006 & 2010   posterior spinal fusion L4-5 L5S1    Family History  Problem Relation Age of Onset  . Heart disease Mother   . Heart disease Father     Heart Disease before age 54  . Heart attack Father   . Hyperlipidemia Father   . Heart disease Sister   . Diabetes Sister   . Heart disease Brother   . Hyperlipidemia Brother   . Heart disease Brother   . Cancer Brother     Social History   Social History  . Marital status: Married    Spouse name: N/A  . Number of children: N/A  .  Years of education: N/A   Occupational History  . Not on file.   Social History Main Topics  . Smoking status: Never Smoker  . Smokeless tobacco: Never Used  . Alcohol use No  . Drug use: No  . Sexual activity: Not on file   Other Topics Concern  . Not on file   Social History Narrative   Has living will   Husband is health care POA   Discussed DNR--she requests (done 11/30/12)   No tube feedings if cognitively unaware    Review of Systems Appetite is unchanged Weight is stable Sleeping well No further vaginal bleeding--- does have gyn appt set up later this month Did have vascular follow up for aneurysm ---1 year recall    Objective:   Physical Exam  Constitutional: No distress.  Psychiatric: She has a normal mood and affect. Her behavior is normal.          Assessment & Plan:

## 2016-07-17 NOTE — Assessment & Plan Note (Signed)
Going to gyn later this month for widened endometrium

## 2016-07-17 NOTE — Patient Instructions (Signed)
Continue the 20mg  daily till 4/19. At that point, decrease to 15mg  on even days (1 & 1/2 tabs) and 20mg  on odd days. Continue this until 5/3, then go down to 10mg  on even days and 20mg  on odd days. Then I will see you back in the office

## 2016-07-17 NOTE — Assessment & Plan Note (Signed)
Diagnosis confirmed with elevated sed rate and incredible response to prednisone Did discuss the risks of long term prednisone and our need to wean slowly over time. Will start slow weaning ---- regimen discussed

## 2016-07-29 ENCOUNTER — Ambulatory Visit (INDEPENDENT_AMBULATORY_CARE_PROVIDER_SITE_OTHER): Payer: Medicare Other | Admitting: Internal Medicine

## 2016-07-29 ENCOUNTER — Encounter: Payer: Self-pay | Admitting: Internal Medicine

## 2016-07-29 VITALS — BP 132/70 | HR 67 | Temp 98.2°F | Ht 65.0 in | Wt 193.8 lb

## 2016-07-29 DIAGNOSIS — K279 Peptic ulcer, site unspecified, unspecified as acute or chronic, without hemorrhage or perforation: Secondary | ICD-10-CM

## 2016-07-29 DIAGNOSIS — E785 Hyperlipidemia, unspecified: Secondary | ICD-10-CM | POA: Diagnosis not present

## 2016-07-29 DIAGNOSIS — Z7189 Other specified counseling: Secondary | ICD-10-CM | POA: Diagnosis not present

## 2016-07-29 DIAGNOSIS — Z0000 Encounter for general adult medical examination without abnormal findings: Secondary | ICD-10-CM | POA: Diagnosis not present

## 2016-07-29 DIAGNOSIS — I10 Essential (primary) hypertension: Secondary | ICD-10-CM

## 2016-07-29 DIAGNOSIS — M353 Polymyalgia rheumatica: Secondary | ICD-10-CM | POA: Diagnosis not present

## 2016-07-29 NOTE — Progress Notes (Signed)
Pre visit review using our clinic review tool, if applicable. No additional management support is needed unless otherwise documented below in the visit note. 

## 2016-07-29 NOTE — Assessment & Plan Note (Signed)
BP Readings from Last 3 Encounters:  07/29/16 132/70  07/17/16 124/80  07/02/16 118/76   Control is fine

## 2016-07-29 NOTE — Progress Notes (Signed)
Subjective:    Patient ID: Sydney [name], female    DOB: 1932-12-04, 83 y.o.   MRN: [id number]  HPI Here for Medicare wellness and follow up of chronic health conditions Reviewed form and advanced directives Reviewed other doctors No tobacco or alcohol Tries to do some exercise Tripped once on long pant leg--- no injury Vision is fine Hearing is good No depression or anhedonia Independent with instrumental ADLs On apparent memory problems  Still feels great on the prednisone Discussed the weaning schedule  Doesn't check BP---high today because very busy this morning Usually 120s/80s No chest pain or breathing problems No palpitations No edema No dizziness   No problems with arthritis since she has been on the prednisone  PUD found in [facility] protonix briefly but stopped No heartburn or dysphagia  Going to the gyn later this month No further bleeding  Current Outpatient Prescriptions on File Prior to Visit  Medication Sig Dispense Refill  . furosemide (LASIX) 20 MG tablet     . nadolol (CORGARD) 40 MG tablet TAKE 1 TABLET BY MOUTH EVERY DAY 90 tablet 3  . pravastatin (PRAVACHOL) 40 MG tablet Take 1 tablet (40 mg total) by mouth daily. 90 tablet 3  . predniSONE (DELTASONE) 10 MG tablet Take 2 tablets (20 mg total) by mouth daily with breakfast. Wean as directed 100 tablet 3   No current facility-administered medications on file prior to visit.     Allergies  Allergen Reactions  . Lipitor [Atorvastatin]     Dizzy ( fuzziness)  . Latex Rash    Past Medical History:  Diagnosis Date  . GERD (gastroesophageal reflux disease)   . Hx of lumbosacral spine surgery t  . Hyperlipidemia   . Hypertension   . Iliac artery aneurysm ([place])   . Lumbar spinal stenosis   . Polymyalgia rheumatica ([place]) 06/2016    Past Surgical History:  Procedure Laterality Date  . JOINT REPLACEMENT  1992   right hip replacement  . JOINT REPLACEMENT  2002   left hip replacement    . ROTATOR CUFF REPAIR  2002   Left shoulder  . SPINE SURGERY  2006 & 2010   posterior spinal fusion L4-5 L5S1    Family History  Problem Relation Age of Onset  . Heart disease Mother   . Heart disease Father     Heart Disease before age 35  . Heart attack Father   . Hyperlipidemia Father   . Heart disease Sister   . Diabetes Sister   . Heart disease Brother   . Hyperlipidemia Brother   . Heart disease Brother   . Cancer Brother     Social History   Social History  . Marital status: Married    Spouse name: N/A  . Number of children: N/A  . Years of education: N/A   Occupational History  . Not on file.   Social History Main Topics  . Smoking status: Never Smoker  . Smokeless tobacco: Never Used  . Alcohol use No  . Drug use: No  . Sexual activity: Not on file   Other Topics Concern  . Not on file   Social History Narrative   Has living will   Husband is health care POA   Discussed DNR--she requests (done 11/30/12)   No tube feedings if cognitively unaware   Review of Systems  Sleeping really well Appetite is good Weight is stable or down a bit over the past year Wears seat belt Teeth  are okay. Keeps up with dentist [facility]) Bowels are fine. No blood in stool Voids okay--no incontinence No skin rash or suspicious lesions recently. Sees derm in [place] ([place] in past)     Objective:   Physical Exam  Constitutional: She is oriented to person, place, and time. She appears well-nourished. No distress.  HENT:  Mouth/Throat: Oropharynx is clear and moist. No oropharyngeal exudate.  Neck: No thyromegaly present.  Cardiovascular: Normal rate, regular rhythm, normal heart sounds and intact distal pulses.  Exam reveals no gallop.   No murmur heard. Pulmonary/Chest: Effort normal and breath sounds normal. No respiratory distress. She has no wheezes. She has no rales.  Abdominal: Soft. There is no tenderness.  Musculoskeletal: She exhibits no edema or  tenderness.  Lymphadenopathy:    She has no cervical adenopathy.  Neurological: She is alert and oriented to person, place, and time.  President--- "Daisy Floro, Barack Obama, Bush" [phone] [id number] Recall 3/3  Skin: No rash noted. No erythema.  Psychiatric: She has a normal mood and affect. Her behavior is normal.          Assessment & Plan:

## 2016-07-29 NOTE — Assessment & Plan Note (Signed)
Has DNR 

## 2016-07-29 NOTE — Assessment & Plan Note (Signed)
Doing well Will work on weaning down and see in 2 months

## 2016-07-29 NOTE — Assessment & Plan Note (Signed)
Doing well on the statin

## 2016-07-29 NOTE — Assessment & Plan Note (Signed)
I have personally reviewed the Medicare Annual Wellness questionnaire and have noted 1. The patient's medical and social history 2. Their use of alcohol, tobacco or illicit drugs 3. Their current medications and supplements 4. The patient's functional ability including ADL's, fall risks, home safety risks and hearing or visual             impairment. 5. Diet and physical activities 6. Evidence for depression or mood disorders  The patients weight, height, BMI and visual acuity have been recorded in the chart I have made referrals, counseling and provided education to the patient based review of the above and I have provided the pt with a written personalized care plan for preventive services.  I have provided you with a copy of your personalized plan for preventive services. Please take the time to review along with your updated medication list.  Yearly flu vaccine Trying to exercise more with less pain Will consider shingrix

## 2016-07-29 NOTE — Assessment & Plan Note (Signed)
No symptoms and off the PPI now

## 2016-08-24 ENCOUNTER — Other Ambulatory Visit: Payer: Self-pay | Admitting: Internal Medicine

## 2016-09-10 ENCOUNTER — Encounter (INDEPENDENT_AMBULATORY_CARE_PROVIDER_SITE_OTHER): Payer: Self-pay

## 2016-09-10 ENCOUNTER — Ambulatory Visit (INDEPENDENT_AMBULATORY_CARE_PROVIDER_SITE_OTHER): Payer: Medicare Other | Admitting: Internal Medicine

## 2016-09-10 ENCOUNTER — Encounter: Payer: Self-pay | Admitting: Internal Medicine

## 2016-09-10 VITALS — BP 130/76 | HR 64 | Temp 97.7°F | Wt 198.0 lb

## 2016-09-10 DIAGNOSIS — M353 Polymyalgia rheumatica: Secondary | ICD-10-CM | POA: Diagnosis not present

## 2016-09-10 MED ORDER — PREDNISONE 10 MG PO TABS: 10.0000 mg | ORAL_TABLET | Freq: Every day | ORAL | 3 refills | Status: DC

## 2016-09-10 NOTE — Progress Notes (Signed)
   Subjective:    Patient ID: Sydney [name], female    DOB: December 21, 1932, 83 y.o.   MRN: [id number]  HPI Here for follow up of PMR  She is very happy with her response Has cut down to 20/15 every other day No ongoing pain Able to do all instrumental ADLs, etc  Current Outpatient Prescriptions on File Prior to Visit  Medication Sig Dispense Refill  . furosemide (LASIX) 20 MG tablet     . nadolol (CORGARD) 40 MG tablet TAKE 1 TABLET BY MOUTH EVERY DAY 90 tablet 3  . pravastatin (PRAVACHOL) 40 MG tablet Take 1 tablet (40 mg total) by mouth daily. 90 tablet 3  . predniSONE (DELTASONE) 10 MG tablet Take 2 tablets (20 mg total) by mouth daily with breakfast. Wean as directed 100 tablet 3   No current facility-administered medications on file prior to visit.     Allergies  Allergen Reactions  . Lipitor [Atorvastatin]     Dizzy ( fuzziness)  . Latex Rash    Past Medical History:  Diagnosis Date  . GERD (gastroesophageal reflux disease)   . Hx of lumbosacral spine surgery t  . Hyperlipidemia   . Hypertension   . Iliac artery aneurysm ([place])   . Lumbar spinal stenosis   . Polymyalgia rheumatica ([place]) 06/2016    Past Surgical History:  Procedure Laterality Date  . JOINT REPLACEMENT  1992   right hip replacement  . JOINT REPLACEMENT  2002   left hip replacement  . ROTATOR CUFF REPAIR  2002   Left shoulder  . SPINE SURGERY  2006 & 2010   posterior spinal fusion L4-5 L5S1    Family History  Problem Relation Age of Onset  . Heart disease Mother   . Heart disease Father        Heart Disease before age 41  . Heart attack Father   . Hyperlipidemia Father   . Heart disease Sister   . Diabetes Sister   . Heart disease Brother   . Hyperlipidemia Brother   . Heart disease Brother   . Cancer Brother     Social History   Social History  . Marital status: Married    Spouse name: N/A  . Number of children: N/A  . Years of education: N/A   Occupational History  . Not on  file.   Social History Main Topics  . Smoking status: Never Smoker  . Smokeless tobacco: Never Used  . Alcohol use No  . Drug use: No  . Sexual activity: Not on file   Other Topics Concern  . Not on file   Social History Narrative   Has living will   Husband is health care POA   Discussed DNR--she requests (done 11/30/12)   No tube feedings if cognitively unaware   Review of Systems  Appetite is very good--especially on the prednisone Weight stable Sleeps well      Objective:   Physical Exam  Constitutional: No distress.  Psychiatric: She has a normal mood and affect. Her behavior is normal.          Assessment & Plan:

## 2016-09-10 NOTE — Patient Instructions (Signed)
Please decrease the prednisone to 15mg  daily. On July 1, if you are doing well, decrease to 15mg  on even days and 10mg  on odd days. On August 1, if you are still doing well, decrease to 10mg  daily.

## 2016-09-10 NOTE — Assessment & Plan Note (Signed)
Has had good response to Rx Will give directions on further wean---will try to get to 10mg  daily over the next 2-3 months See back before they go back to [place] for the winter

## 2016-10-23 ENCOUNTER — Other Ambulatory Visit: Payer: Self-pay

## 2016-10-23 MED ORDER — NADOLOL 40 MG PO TABS
40.0000 mg | ORAL_TABLET | Freq: Every day | ORAL | 3 refills | Status: DC
Start: 2016-10-23 — End: 2017-11-17

## 2016-11-10 ENCOUNTER — Telehealth: Payer: Self-pay | Admitting: *Deleted

## 2016-11-10 DIAGNOSIS — B373 Candidiasis of vulva and vagina: Secondary | ICD-10-CM

## 2016-11-10 DIAGNOSIS — B3731 Acute candidiasis of vulva and vagina: Secondary | ICD-10-CM

## 2016-11-10 MED ORDER — FLUCONAZOLE 150 MG PO TABS: ORAL_TABLET | ORAL | 1 refills | Status: DC

## 2016-11-10 NOTE — Telephone Encounter (Signed)
Patient left a voicemail stating that she was given a medication for 2 pills back in April for a yeast infection. Patient stated that she needs that again for the same problem. Pharmacy- [facility]

## 2016-11-10 NOTE — Telephone Encounter (Signed)
Spoke to pt

## 2016-11-10 NOTE — Telephone Encounter (Signed)
Please let her know that I sent a refill for her

## 2017-01-14 ENCOUNTER — Other Ambulatory Visit: Payer: Self-pay

## 2017-01-14 DIAGNOSIS — I723 Aneurysm of iliac artery: Secondary | ICD-10-CM

## 2017-02-03 ENCOUNTER — Encounter: Payer: Self-pay | Admitting: Family

## 2017-02-03 ENCOUNTER — Ambulatory Visit (INDEPENDENT_AMBULATORY_CARE_PROVIDER_SITE_OTHER): Payer: Medicare Other | Admitting: Family

## 2017-02-03 ENCOUNTER — Ambulatory Visit (HOSPITAL_COMMUNITY)
Admission: RE | Admit: 2017-02-03 | Discharge: 2017-02-03 | Disposition: A | Discharge status: Home / Self Care | Payer: Medicare Other | Source: Ambulatory Visit | Attending: Family | Admitting: Family

## 2017-02-03 VITALS — BP 139/76 | HR 92 | Temp 97.0°F | Resp 16 | Ht 66.0 in | Wt 198.0 lb

## 2017-02-03 DIAGNOSIS — I723 Aneurysm of iliac artery: Secondary | ICD-10-CM

## 2017-02-03 NOTE — Patient Instructions (Signed)
Before your next abdominal ultrasound:  Take two Extra-Strength Gas-X capsules at bedtime the night before the test. Take another two Extra-Strength Gas-X capsules 3 hours before the test.  Avoid gas forming foods the day before the test.       

## 2017-02-03 NOTE — Progress Notes (Signed)
[facility] [place]   CC: Follow up Right Iliac Artery Aneurysm   History of Present Illness  Sydney [name] is a 83 y.o. (1932/09/01) female patient of Dr. Donnetta Hutching who presents today for followup of her incidentally found iliac artery aneurysm. She underwent CT scan for degenerative disc disease and was found to have a 2 cm right common iliac artery aneurysm.  Her risk factors for aneurysmal growth are not the following: is not diabetic, does not use tobacco, blood pressure is well controlled, is not sedentary, takes a 81 mg ASA qod .  Patient denies abdominal pain or new back pain.  She does have a history of lumbar surgery and chronic back problems with standing for long periods. She has had bilateral hip replacements several years ago.   She states her blood pressure at home runs 120's/80's.   The patient denies claudication in legs with walking. The patient denies history of stroke or TIA symptoms.  Pt Diabetic: No Pt smoker: non-smoker   Pt meds include: Statin :yes Betablocker: Yes ASA: Yes Other anticoagulants/antiplatelets: no   Past Medical History:  Diagnosis Date  . GERD (gastroesophageal reflux disease)   . Hx of lumbosacral spine surgery t  . Hyperlipidemia   . Hypertension   . Iliac artery aneurysm ([place])   . Lumbar spinal stenosis   . Polymyalgia rheumatica ([place]) 06/2016   Past Surgical History:  Procedure Laterality Date  . JOINT REPLACEMENT  1992   right hip replacement  . JOINT REPLACEMENT  2002   left hip replacement  . ROTATOR CUFF REPAIR  2002   Left shoulder  . SPINE SURGERY  2006 & 2010   posterior spinal fusion L4-5 L5S1   Social History Social History   Social History  . Marital status: Married    Spouse name: N/A  . Number of children: N/A  . Years of education: N/A   Occupational History  . Not on file.   Social History Main Topics  . Smoking status: Never Smoker  . Smokeless tobacco: Never Used  .  Alcohol use No  . Drug use: No  . Sexual activity: Not on file   Other Topics Concern  . Not on file   Social History Narrative   Has living will   Husband is health care POA   Discussed DNR--she requests (done 11/30/12)   No tube feedings if cognitively unaware   Family History Family History  Problem Relation Age of Onset  . Heart disease Mother   . Heart disease Father        Heart Disease before age 37  . Heart attack Father   . Hyperlipidemia Father   . Heart disease Sister   . Diabetes Sister   . Heart disease Brother   . Hyperlipidemia Brother   . Heart disease Brother   . Cancer Brother     Current Outpatient Prescriptions on File Prior to Visit  Medication Sig Dispense Refill  . fluconazole (DIFLUCAN) 150 MG tablet Take 1 tablet by mouth once. Repeat in 1 week if needed. 2 tablet 1  . furosemide (LASIX) 20 MG tablet     . nadolol (CORGARD) 40 MG tablet Take 1 tablet (40 mg total) by mouth daily. 90 tablet 3  . pravastatin (PRAVACHOL) 40 MG tablet Take 1 tablet (40 mg total) by mouth daily. 90 tablet 3  . predniSONE (DELTASONE) 10 MG tablet Take 1-1.5 tablets (10-15 mg total) by mouth daily with breakfast. Wean as  directed 100 tablet 3   No current facility-administered medications on file prior to visit.    Allergies  Allergen Reactions  . Lipitor [Atorvastatin]     Dizzy ( fuzziness)  . Latex Rash    ROS: See HPI for pertinent positives and negatives.  Physical Examination  Vitals:   02/03/17 0850 02/03/17 0852  BP: (!) 156/75 139/76  Pulse: 92 92  Resp: 16   Temp: (!) 97 F (36.1 C)   SpO2: 96%   Weight: 198 lb (89.8 kg)   Height: 5\' 6"  (1.676 m)    Body mass index is 31.96 kg/m.  General: A&O x 3, WD, obese female, walks with a cane and a limp.  Pulmonary: Sym exp, respirations are non labored, good air movt, CTAB, no rales, rhonchi, orwheezing.  Cardiac: RRR, Nl S1, S2, no murmur detected.   Carotid Bruits Left Right    Negative  Negative   VASCULAR EXAM: LE Pulses  LEFT  RIGHT   FEMORAL  1+palpable 1+ palpable   POPLITEAL  3+palpable  2+palpable  POSTERIOR TIBIAL  Not palpable  2+palpable   DORSALIS PEDIS ANTERIOR TIBIAL  2+palpable  2+palpable   AORTA  Not palpable N/A    Gastrointestinal: soft, NTND, -G/R, - HSM, - masses palpated, - CVAT B.  Musculoskeletal: M/S 5/5 in upper extremities, 4/5 in LE's, Extremities without ischemic changes, no peripheral edema.  Neurologic: CN 2-12 intact, Pain and light touch intact in extremities, Motor exam as listed above.     DATA  AAA Duplex (02/03/2017):  Previous size: Right CIA: 1.91 cm x 1.86 cm (Date: 01/29/16). No evidence of AAA. Left CIA at 1.35 cm x 1.38 cm.   Current size: Right CIA: 2.32 cm x 2.16 cm (Date: 02/03/17). No evidence of AAA. Left CIA at 1.4 cm x 1.4 cm.   Medical Decision Making  The patient is a 83 y.o. female who presents with asymptomatic AAA with slight increase in size, to 2.32 cm from 1.91 cm.  Prominent popliteal pulses: Bilateral popliteal artery duplex on 01/26/15 demonstrates no evidence of popliteal aneurysms.     Based on this patient's exam and diagnostic studies, the patient will follow up in 9 months with the following studies: bilateral aortoiliac duplex.   Consideration for repair of a common iliac artery aneurysm would be made when the size is 3.0 cm, growth > 1 cm/yr, and symptomatic status.    I emphasized the importance of maximal medical management including strict control of blood pressure, blood glucose, and lipid levels, antiplatelet agents, obtaining regular exercise, and continued cessation of smoking.   The patient was advised to call 911 should the patient experience sudden onset abdominal or back pain.   Thank you for allowing [place] to participate in this patient's care.  Clemon Chambers, RN, MSN, FNP-C [facility] Office:  [phone]  Clinic Physician: Early  02/03/2017, 9:01 AM

## 2017-02-03 NOTE — Progress Notes (Signed)
Vitals:   02/03/17 0850  BP: (!) 156/75  Pulse: 92  Resp: 16  Temp: (!) 97 F (36.1 C)  SpO2: 96%  Weight: 198 lb (89.8 kg)  Height: 5\' 6"  (1.676 m)

## 2017-02-06 ENCOUNTER — Other Ambulatory Visit: Payer: Self-pay | Admitting: Internal Medicine

## 2017-02-18 NOTE — Addendum Note (Signed)
Addended by: Lianne Cure A on: 02/18/2017 03:26 PM   Modules accepted: Orders

## 2017-03-13 ENCOUNTER — Telehealth: Payer: Self-pay | Admitting: Internal Medicine

## 2017-03-13 DIAGNOSIS — B373 Candidiasis of vulva and vagina: Secondary | ICD-10-CM

## 2017-03-13 DIAGNOSIS — B3731 Acute candidiasis of vulva and vagina: Secondary | ICD-10-CM

## 2017-03-13 MED ORDER — FLUCONAZOLE 150 MG PO TABS: ORAL_TABLET | ORAL | 1 refills | Status: DC

## 2017-03-13 NOTE — Telephone Encounter (Signed)
Spoke to pt. I sent in the refill since she has had it several times in the past year.

## 2017-03-13 NOTE — Telephone Encounter (Signed)
Pt called in to check the status. Pt says that she is currently hurting and would like to make sure that provider has request for medication. Pt would like to have sent to pharmacy as soon as possible.    Thanks.

## 2017-03-13 NOTE — Telephone Encounter (Signed)
Pt  Requesting a  Refill of  diflucan

## 2017-03-13 NOTE — Telephone Encounter (Signed)
Copied from [facility] [id number]. Topic: Quick Communication - See Telephone Encounter >> Mar 13, 2017  2:08 PM Aurelio Brash B wrote: CRM for notification. See Telephone encounter for:  Refill diflucan     [facility] [address] 03/13/17.

## 2017-08-08 IMAGING — DX DG HIP (WITH OR WITHOUT PELVIS) 1V*L*
1 series · 1 of 1 positions shown · non-contrast
Comparison: Plain films left hip 12/27/2015.

CLINICAL DATA: Status post reduction of left hip dislocation today.
Initial encounter.

EXAM:
DG HIP (WITH OR WITHOUT PELVIS) 1V*L*

[hip ap]
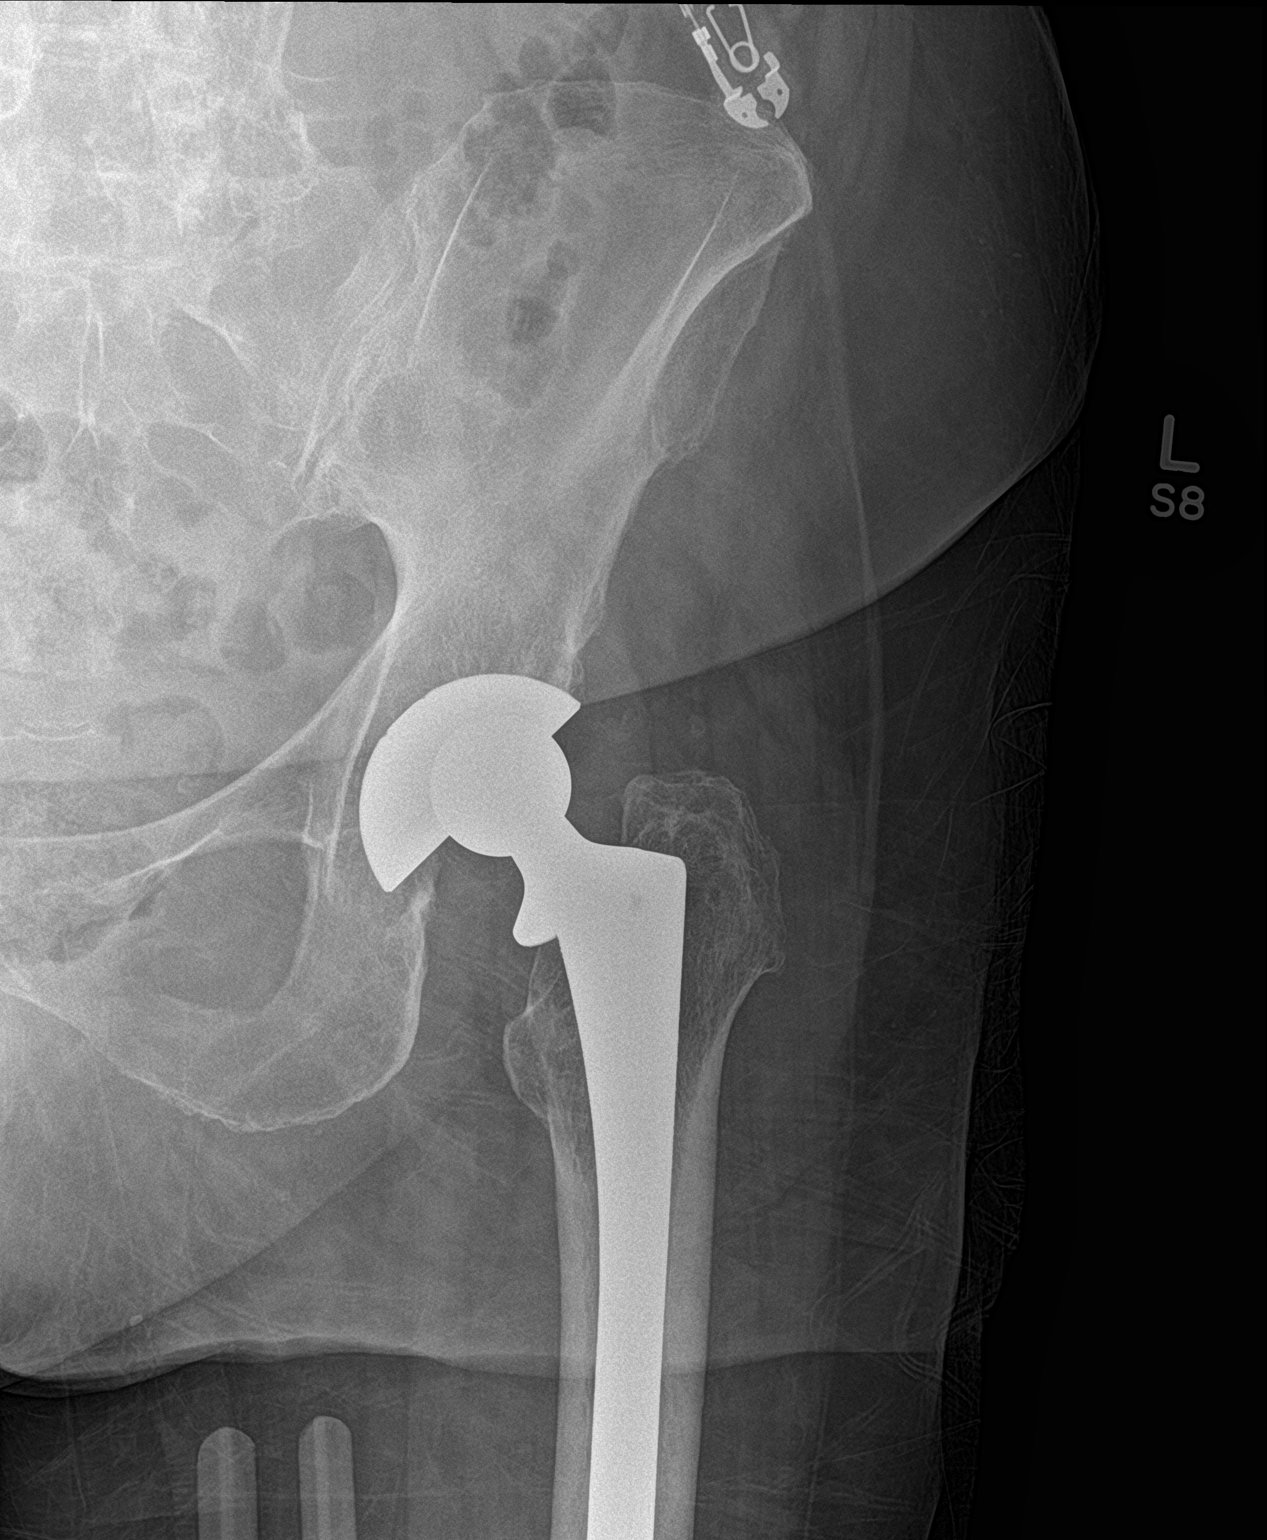

[1 of 1 positions shown; findings below may reference images not displayed]

FINDINGS: The patient's left hip prosthesis has been reduced. The femoral head
is asymmetrically positioned in the acetabular cup compatible with
wear of the lining of the cup. No fracture.
IMPRESSION: Successful reduction of dislocation.

Findings compatible with wear of the lining of the acetabular cup of
the patient's left hip prosthesis.

## 2017-08-08 IMAGING — DX DG HIP (WITH OR WITHOUT PELVIS) 2-3V*L*
3 series · 3 of 3 positions shown · non-contrast
Comparison: Left hip series of August 09, 2015

CLINICAL DATA: Left hip dislocation this morning

EXAM:
DG HIP (WITH OR WITHOUT PELVIS) 2-3V LEFT

[t pelvis ap]
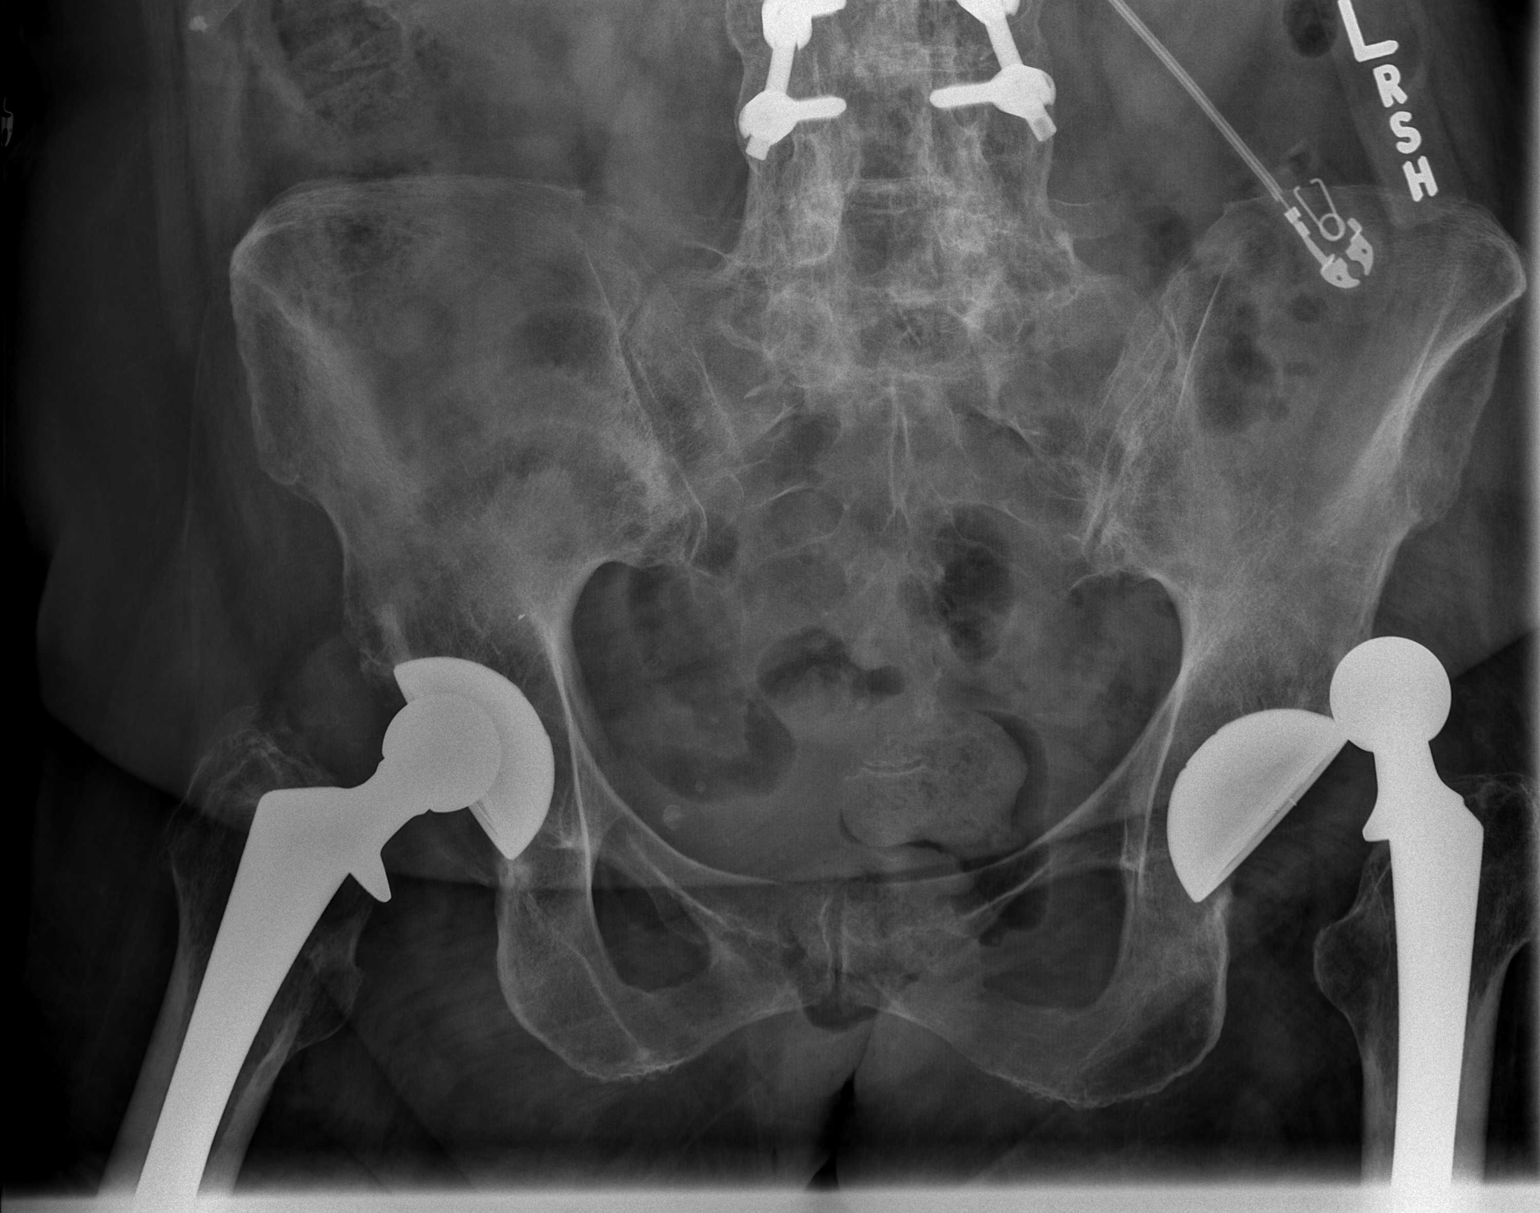

[t hip ap left]
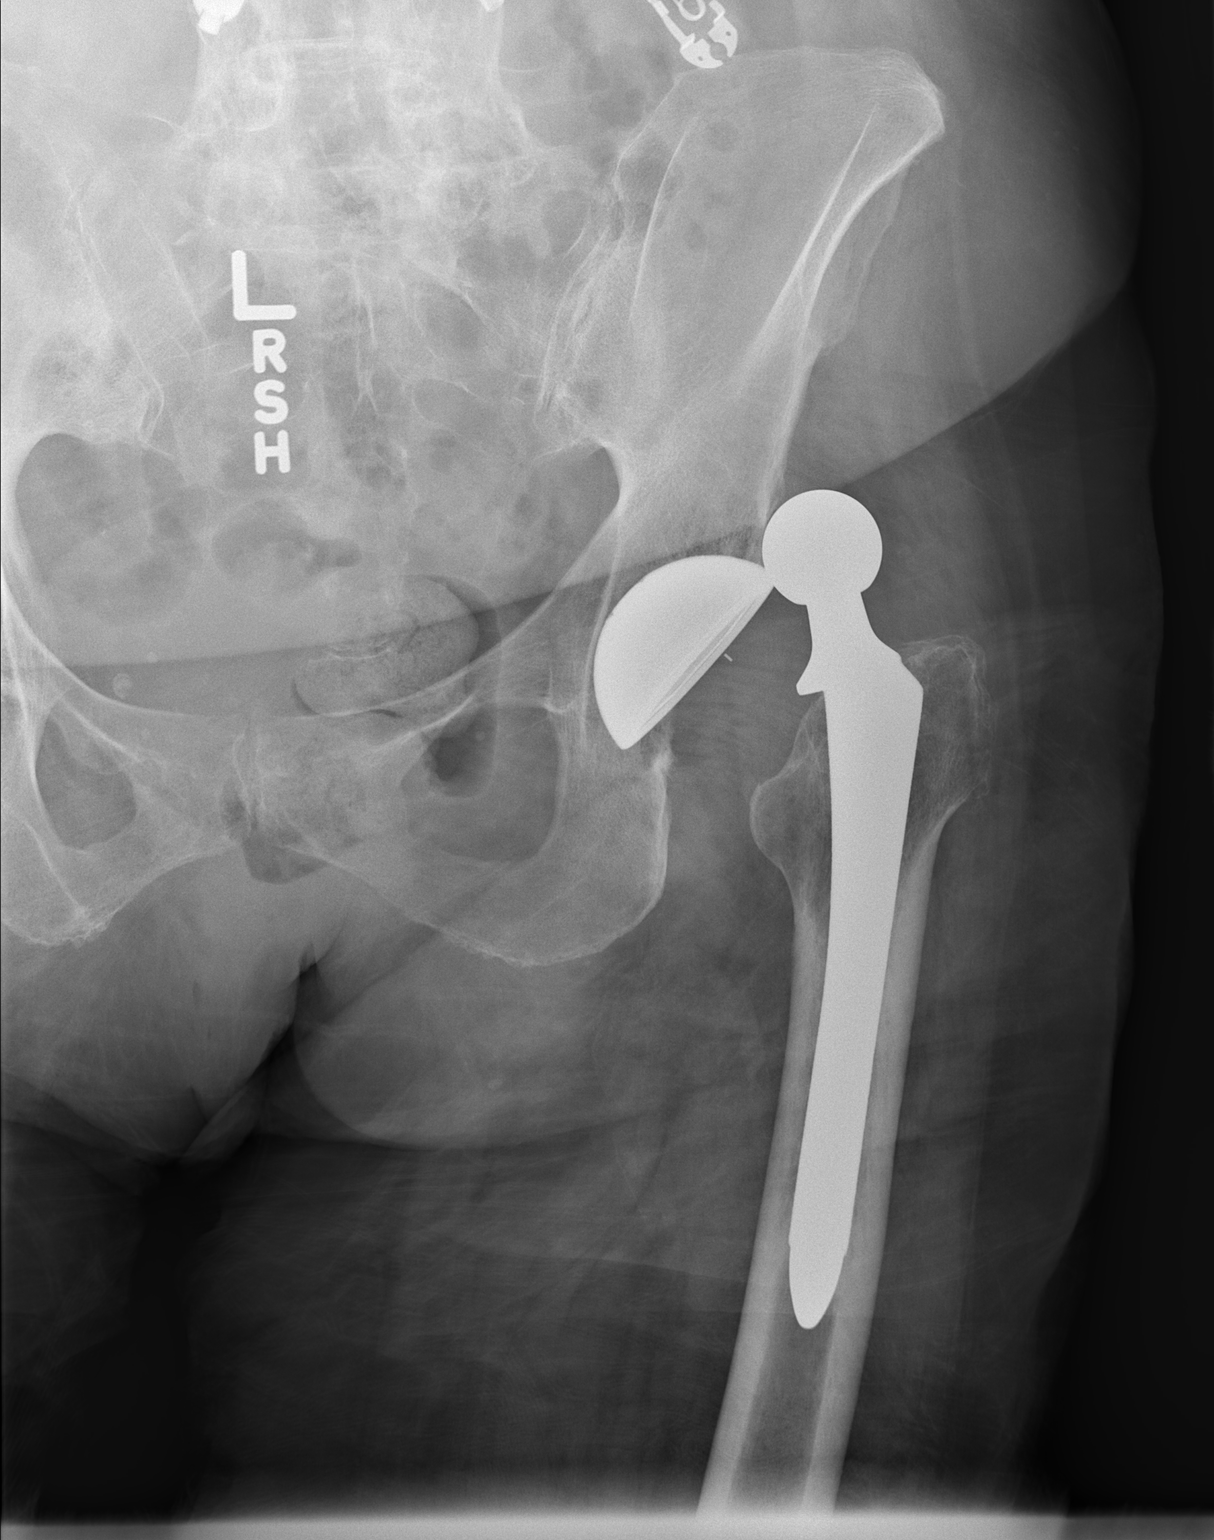

[x hip lat left]
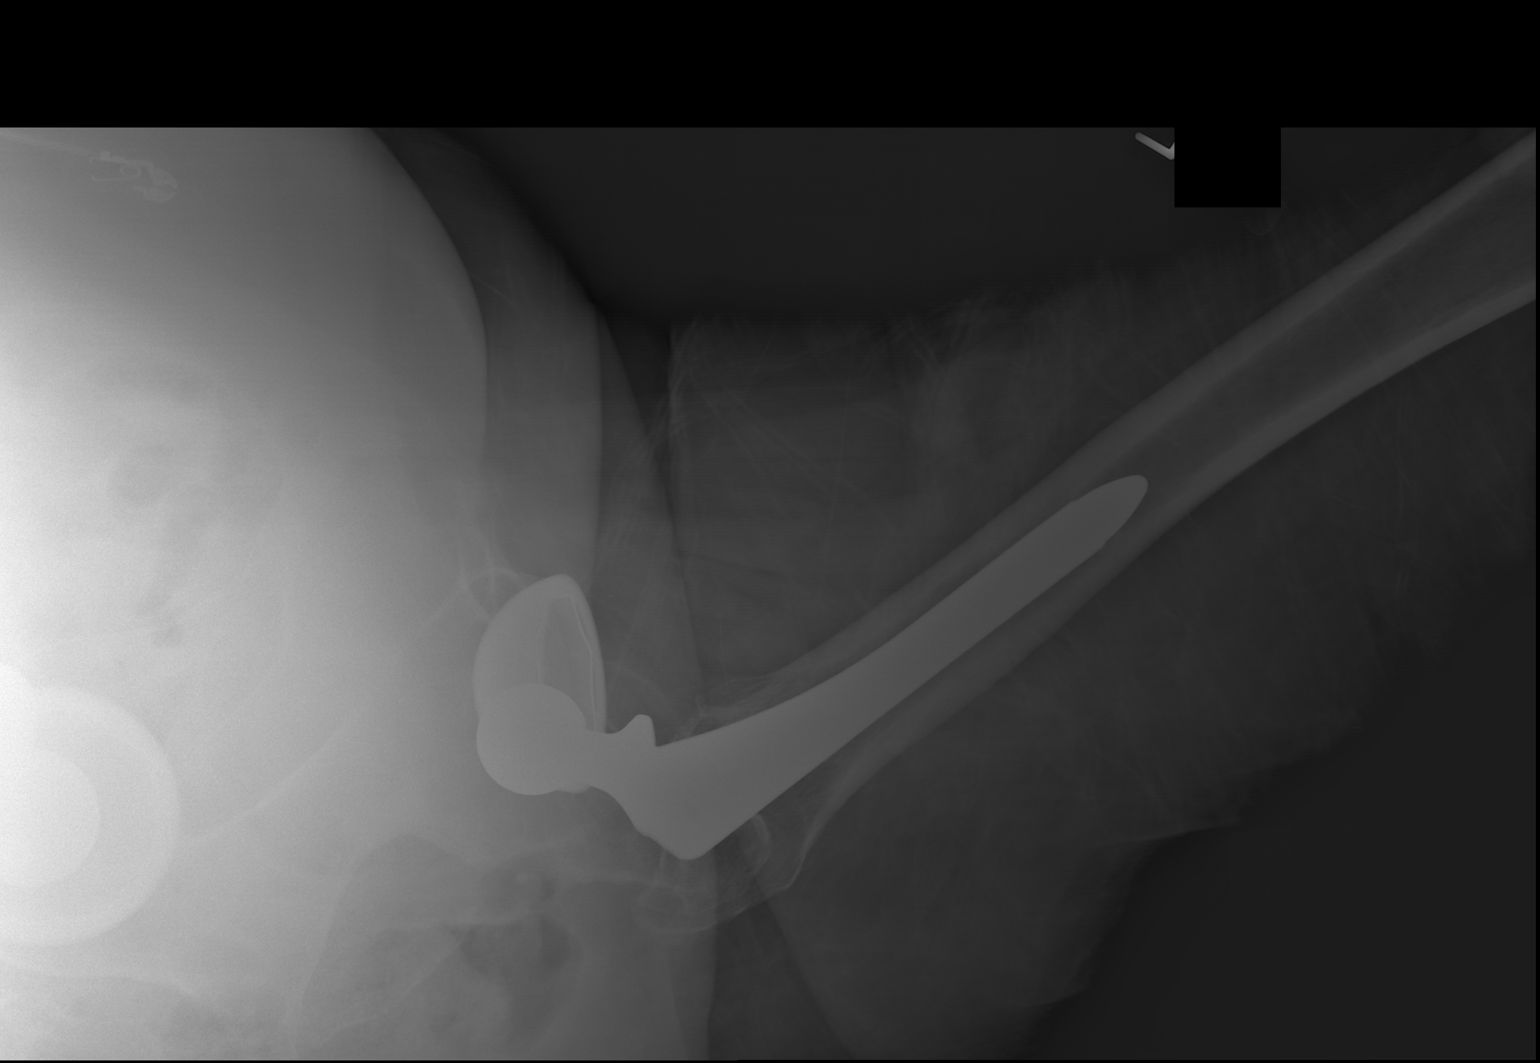

[3 of 3 positions shown; findings below may reference images not displayed]

FINDINGS: The patient has sustained superior and posterior displacement of the
prosthetic left femoral head with respect to the acetabular cup. No
acute native bone fracture is observed.
IMPRESSION: Superior and posterior dislocation of the prosthetic left hip joint.

## 2017-09-02 ENCOUNTER — Ambulatory Visit (INDEPENDENT_AMBULATORY_CARE_PROVIDER_SITE_OTHER): Payer: Medicare Other | Admitting: Internal Medicine

## 2017-09-02 ENCOUNTER — Encounter: Payer: Self-pay | Admitting: Internal Medicine

## 2017-09-02 VITALS — BP 132/70 | HR 56 | Temp 97.4°F | Ht 66.0 in | Wt 198.0 lb

## 2017-09-02 DIAGNOSIS — I10 Essential (primary) hypertension: Secondary | ICD-10-CM

## 2017-09-02 DIAGNOSIS — M353 Polymyalgia rheumatica: Secondary | ICD-10-CM | POA: Diagnosis not present

## 2017-09-02 DIAGNOSIS — Z23 Encounter for immunization: Secondary | ICD-10-CM

## 2017-09-02 DIAGNOSIS — Z0000 Encounter for general adult medical examination without abnormal findings: Secondary | ICD-10-CM | POA: Diagnosis not present

## 2017-09-02 DIAGNOSIS — E785 Hyperlipidemia, unspecified: Secondary | ICD-10-CM

## 2017-09-02 DIAGNOSIS — I723 Aneurysm of iliac artery: Secondary | ICD-10-CM

## 2017-09-02 DIAGNOSIS — Z7189 Other specified counseling: Secondary | ICD-10-CM

## 2017-09-02 LAB — COMPREHENSIVE METABOLIC PANEL
ALT: 12 U/L (ref 0–35)
AST: 13 U/L (ref 0–37)
Albumin: 4.2 g/dL (ref 3.5–5.2)
Alkaline Phosphatase: 49 U/L (ref 39–117)
BUN: 19 mg/dL (ref 6–23)
CHLORIDE: 105 meq/L (ref 96–112)
CO2: 29 meq/L (ref 19–32)
CREATININE: 0.91 mg/dL (ref 0.40–1.20)
Calcium: 9.8 mg/dL (ref 8.4–10.5)
GFR: 62.74 mL/min (ref >60.00)
GLUCOSE: 128 mg/dL — AB (ref 70–99)
POTASSIUM: 4 meq/L (ref 3.5–5.1)
SODIUM: 143 meq/L (ref 135–145)
Total Bilirubin: 0.8 mg/dL (ref 0.2–1.2)
Total Protein: 7.2 g/dL (ref 6.0–8.3)

## 2017-09-02 LAB — LIPID PANEL
CHOL/HDL RATIO: 5
Cholesterol: 300 mg/dL — ABNORMAL HIGH (ref 0–200)
HDL: 61.9 mg/dL (ref >39.00)
LDL CALC: 200 mg/dL — AB (ref 0–99)
NonHDL: 238.24
Triglycerides: 193 mg/dL — ABNORMAL HIGH (ref 0.0–149.0)
VLDL: 38.6 mg/dL (ref 0.0–40.0)

## 2017-09-02 LAB — SEDIMENTATION RATE: Sed Rate: 9 mm/hr (ref 0–30)

## 2017-09-02 LAB — CBC
HEMATOCRIT: 46.9 % — AB (ref 36.0–46.0)
Hemoglobin: 15.4 g/dL — ABNORMAL HIGH (ref 12.0–15.0)
MCHC: 32.9 g/dL (ref 30.0–36.0)
MCV: 93.2 fl (ref 78.0–100.0)
Platelets: 199 10*3/uL (ref 150.0–400.0)
RBC: 5.03 Mil/uL (ref 3.87–5.11)
RDW: 15 % (ref 11.5–15.5)
WBC: 6.5 10*3/uL (ref 4.0–10.5)

## 2017-09-02 LAB — T4, FREE: FREE T4: 1.06 ng/dL (ref 0.60–1.60)

## 2017-09-02 NOTE — Assessment & Plan Note (Signed)
Seems to be in remission on very low dose prednisone Will continue this dose

## 2017-09-02 NOTE — Assessment & Plan Note (Signed)
BP Readings from Last 3 Encounters:  09/02/17 132/70  02/03/17 139/76  09/10/16 130/76   Good control Due for labs

## 2017-09-02 NOTE — Addendum Note (Signed)
Addended by: Pilar Grammes on: 09/02/2017 10:54 AM   Modules accepted: Orders

## 2017-09-02 NOTE — Assessment & Plan Note (Signed)
I have personally reviewed the Medicare Annual Wellness questionnaire and have noted 1. The patient's medical and social history 2. Their use of alcohol, tobacco or illicit drugs 3. Their current medications and supplements 4. The patient's functional ability including ADL's, fall risks, home safety risks and hearing or visual             impairment. 5. Diet and physical activities 6. Evidence for depression or mood disorders  The patients weight, height, BMI and visual acuity have been recorded in the chart I have made referrals, counseling and provided education to the patient based review of the above and I have provided the pt with a written personalized care plan for preventive services.  I have provided you with a copy of your personalized plan for preventive services. Please take the time to review along with your updated medication list.  No cancer screening due to age Pneumovax booster Yearly flu vaccine Discussed getting back to her exercise

## 2017-09-02 NOTE — Progress Notes (Signed)
Hearing Screening   Method: Audiometry   125Hz  250Hz  500Hz  1000Hz  2000Hz  3000Hz  4000Hz  6000Hz  8000Hz   Right ear:   20 0 20  20    Left ear:   20 40 40  40      Visual Acuity Screening   Right eye Left eye Both eyes  Without correction: 20/25 20/25 20/25   With correction:

## 2017-09-02 NOTE — Assessment & Plan Note (Signed)
Has DNR 

## 2017-09-02 NOTE — Progress Notes (Signed)
Subjective:    Patient ID: Sydney [name], female    DOB: [birth year], 84 y.o.   MRN: [id number]  HPI Here for several concerns---but also due for Medicare wellness visit Reviewed advanced directives Reviewed other doctors---- Dr Matthew Saras (primary care in [place]), Dr Early ( vascular), needs new eye doctor (formerly Dr Dolores Lory), needs new dentist also Hasn't been able to exercise recently---husband in hospital and rehab. Tries to do exercise bike Occasional glass of wine No tobacco Vision is fine. Hearing is good No falls No true depression---but stress with husband's medical problems. Not anhedonic Independent with instrumental ADLs No memory problems  Has knot on right collarbone for past 2-3 weeks No known injury No pain Seems better today  Place on lower lip for years No significant change in the past year Did see dermatologist about this 1.5 years ago---wasn't concerned  Keeps up with yearly appt for the iliac artery aneurysm Last done in October---slight increase  Stopped her statin about 3 weeks ago Makes her feel "fuzzy headed" She does feel better off it  No symptoms from the polymyalgia rheumatica Now down to 5mg  every other day  No chest pain No SOB No dizziness or syncope Rare edema--last after plane ride. Hasn't needed the furosemide  Current Outpatient Medications on File Prior to Visit  Medication Sig Dispense Refill  . furosemide (LASIX) 20 MG tablet TAKE 2 TABLETS BY MOUTH EVERY DAY 180 tablet 2  . nadolol (CORGARD) 40 MG tablet Take 1 tablet (40 mg total) by mouth daily. 90 tablet 3  . predniSONE (DELTASONE) 10 MG tablet Take 1-1.5 tablets (10-15 mg total) by mouth daily with breakfast. Wean as directed (Patient taking differently: Take 5 mg by mouth every other day. Wean as directed) 100 tablet 3   No current facility-administered medications on file prior to visit.     Allergies  Allergen Reactions  . Lipitor [Atorvastatin]     Dizzy ( fuzziness)    . Latex Rash    Past Medical History:  Diagnosis Date  . GERD (gastroesophageal reflux disease)   . Hx of lumbosacral spine surgery t  . Hyperlipidemia   . Hypertension   . Iliac artery aneurysm ([place])   . Lumbar spinal stenosis   . Polymyalgia rheumatica ([place]) 06/2016    Past Surgical History:  Procedure Laterality Date  . JOINT REPLACEMENT  1992   right hip replacement  . JOINT REPLACEMENT  2002   left hip replacement  . ROTATOR CUFF REPAIR  2002   Left shoulder  . SPINE SURGERY  2006 & 2010   posterior spinal fusion L4-5 L5S1    Family History  Problem Relation Age of Onset  . Heart disease Mother   . Heart disease Father        Heart Disease before age 25  . Heart attack Father   . Hyperlipidemia Father   . Heart disease Sister   . Diabetes Sister   . Heart disease Brother   . Hyperlipidemia Brother   . Heart disease Brother   . Cancer Brother     Social History   Socioeconomic History  . Marital status: Married    Spouse name: Not on file  . Number of children: Not on file  . Years of education: Not on file  . Highest education level: Not on file  Occupational History  . Not on file  Social Needs  . Financial resource strain: Not on file  . Food insecurity:    Worry:  Not on file    Inability: Not on file  . Transportation needs:    Medical: Not on file    Non-medical: Not on file  Tobacco Use  . Smoking status: Never Smoker  . Smokeless tobacco: Never Used  Substance and Sexual Activity  . Alcohol use: No    Alcohol/week: 0.0 oz  . Drug use: No  . Sexual activity: Not on file  Lifestyle  . Physical activity:    Days per week: Not on file    Minutes per session: Not on file  . Stress: Not on file  Relationships  . Social connections:    Talks on phone: Not on file    Gets together: Not on file    Attends religious service: Not on file    Active member of club or organization: Not on file    Attends meetings of clubs or organizations:  Not on file    Relationship status: Not on file  . Intimate partner violence:    Fear of current or ex partner: Not on file    Emotionally abused: Not on file    Physically abused: Not on file    Forced sexual activity: Not on file  Other Topics Concern  . Not on file  Social History Narrative   Has living will   Husband is health care POA--- alternate is daughter [name] (then son [name])   Discussed DNR--she requests (done 11/30/12)   No tube feedings if cognitively unaware   Review of Systems No headaches Sleeps okay if doesn't get preoccupied with things Appetite is good Weight is stable Wears seat belt Teeth okay---needs to find new dentist Bowels are fine. No blood Voids well. No incontinence Occasional back pain---from past fusions. No other significant arthritis problems No other skin lesions or rash No heartburn or dysphagia    Objective:   Physical Exam  Constitutional: She is oriented to person, place, and time. She appears well-developed. No distress.  HENT:  Mouth/Throat: Oropharynx is clear and moist. No oropharyngeal exudate.  Small raised apparent vascular nodule on lower lip  Neck: No thyromegaly present.  Cardiovascular: Normal rate, normal heart sounds and intact distal pulses. Exam reveals no gallop.  No murmur heard. Bigeminy and trigeminy mostly  Respiratory: Effort normal and breath sounds normal. No respiratory distress. She has no wheezes. She has no rales.  GI: Soft. There is no tenderness.  Musculoskeletal: She exhibits no edema or tenderness.  Small soft tissue mass over mid right clavicle (smaller than yesterday when I checked in her husband's room at rehab)  Lymphadenopathy:    She has no cervical adenopathy.  Neurological: She is alert and oriented to person, place, and time.  President--- "Daisy Floro, Bush--- OBama" [phone] [id number] Recall 2/3  Skin: No rash noted. No erythema.  Psychiatric: She has a normal mood and  affect. Her behavior is normal.           Assessment & Plan:

## 2017-09-02 NOTE — Assessment & Plan Note (Signed)
Feels better off statin Would only try another one if Dr Donnetta Hutching felt really strong about it

## 2017-09-02 NOTE — Assessment & Plan Note (Signed)
Monitored yearly by Dr Donnetta Hutching

## 2017-09-10 ENCOUNTER — Other Ambulatory Visit: Payer: Self-pay | Admitting: Internal Medicine

## 2017-10-26 ENCOUNTER — Other Ambulatory Visit: Payer: Self-pay | Admitting: Internal Medicine

## 2017-10-26 NOTE — Telephone Encounter (Signed)
Approved:probably should change to 5mg  tabs She takes every other day Can confirm and do #45 x 3 if she is still on this dose

## 2017-10-27 MED ORDER — PREDNISONE 5 MG PO TABS: 5.0000 mg | ORAL_TABLET | ORAL | 3 refills | Status: DC

## 2017-10-27 NOTE — Telephone Encounter (Signed)
Spoke to pt. She did want to go on the 5mg  tablet. New rx sent.

## 2017-11-17 ENCOUNTER — Other Ambulatory Visit: Payer: Self-pay | Admitting: Internal Medicine

## 2017-11-26 ENCOUNTER — Ambulatory Visit (INDEPENDENT_AMBULATORY_CARE_PROVIDER_SITE_OTHER)
Admission: RE | Admit: 2017-11-26 | Discharge: 2017-11-26 | Disposition: A | Discharge status: Home / Self Care | Payer: Medicare Other | Source: Ambulatory Visit | Attending: Family Medicine | Admitting: Family Medicine

## 2017-11-26 ENCOUNTER — Ambulatory Visit (INDEPENDENT_AMBULATORY_CARE_PROVIDER_SITE_OTHER): Payer: Medicare Other | Admitting: Family Medicine

## 2017-11-26 ENCOUNTER — Encounter: Payer: Self-pay | Admitting: Family Medicine

## 2017-11-26 VITALS — BP 120/70 | HR 61 | Temp 97.5°F | Ht 66.0 in | Wt 188.5 lb

## 2017-11-26 DIAGNOSIS — M353 Polymyalgia rheumatica: Secondary | ICD-10-CM | POA: Diagnosis not present

## 2017-11-26 DIAGNOSIS — M545 Low back pain, unspecified: Secondary | ICD-10-CM

## 2017-11-26 DIAGNOSIS — Z981 Arthrodesis status: Secondary | ICD-10-CM

## 2017-11-26 MED ORDER — PREDNISONE 10 MG PO TABS: ORAL_TABLET | ORAL | 0 refills | Status: DC

## 2017-11-26 NOTE — Progress Notes (Signed)
Dr. Frederico Hamman T. Zi Newbury, MD, [facility] [place], [address] Phone: [phone] Fax: [phone]  11/26/2017  Patient: Sydney [name], MRN: [id number], DOB: 03-04-1933, 84 y.o.  Primary Physician:  Venia Carbon, MD   Chief Complaint  Patient presents with  . Back Pain    Mid lower back.  Only seems to hurt at night   Subjective:   Sydney [name] is a 84 y.o. very pleasant female patient who presents with the following:  Mid lower back pain. H/o PR  Lumbar fusion L4-5 and L5-S1, 2006 and 2010 Dr. Ellene Route The patient tells me that she has had fusions at 9 different levels in her back.  The medical record indicates that the patient has had fusion from L4-S1.  A lot of physical demands with husband being in the ICU for 117 days, [place] and then [facility].  Pain and localized into her lower back.  Taking some tylenol and Salonpas.   She has been doing a lot more lifting recently with her back, since her husband needs help with transfers, and the also has to be changed since he cannot make it to the bathroom independently.  She is having pain primarily in the lumbar back diffusely from L1-S1 bilaterally.  There are no radicular symptoms, numbness, or tingling.  Past Medical History, Surgical History, Social History, Family History, Problem List, Medications, and Allergies have been reviewed and updated if relevant.  Patient Active Problem List   Diagnosis Date Noted  . Polymyalgia rheumatica ([place]) 06/12/2016  . Peptic ulcer 10/25/2015  . Hyperlipemia 07/23/2015  . Actinic keratosis 07/23/2015  . Generalized osteoarthritis of multiple sites 07/14/2014  . Advanced directives, counseling/discussion 12/02/2013  . Routine general medical examination at a health care facility 11/30/2012  . GERD (gastroesophageal reflux disease)   . Iliac artery aneurysm, right ([place]) 01/18/2011  . Obesity, unspecified 01/02/2009  .  DEGENERATIVE DISC DISEASE, LUMBAR SPINE 01/02/2009  . Essential hypertension, benign 01/14/2007  . DIVERTICULOSIS, COLON 12/15/2002    Past Medical History:  Diagnosis Date  . GERD (gastroesophageal reflux disease)   . Hx of lumbosacral spine surgery t  . Hyperlipidemia   . Hypertension   . Iliac artery aneurysm ([place])   . Lumbar spinal stenosis   . Polymyalgia rheumatica ([place]) 06/2016    Past Surgical History:  Procedure Laterality Date  . JOINT REPLACEMENT  1992   right hip replacement  . JOINT REPLACEMENT  2002   left hip replacement  . ROTATOR CUFF REPAIR  2002   Left shoulder  . SPINE SURGERY  2006 & 2010   posterior spinal fusion L4-5 L5S1    Social History   Socioeconomic History  . Marital status: Married    Spouse name: Not on file  . Number of children: Not on file  . Years of education: Not on file  . Highest education level: Not on file  Occupational History  . Not on file  Social Needs  . Financial resource strain: Not on file  . Food insecurity:    Worry: Not on file    Inability: Not on file  . Transportation needs:    Medical: Not on file    Non-medical: Not on file  Tobacco Use  . Smoking status: Never Smoker  . Smokeless tobacco: Never Used  Substance and Sexual Activity  . Alcohol use: No    Alcohol/week: 0.0 standard drinks  . Drug use: No  .  Sexual activity: Not on file  Lifestyle  . Physical activity:    Days per week: Not on file    Minutes per session: Not on file  . Stress: Not on file  Relationships  . Social connections:    Talks on phone: Not on file    Gets together: Not on file    Attends religious service: Not on file    Active member of club or organization: Not on file    Attends meetings of clubs or organizations: Not on file    Relationship status: Not on file  . Intimate partner violence:    Fear of current or ex partner: Not on file    Emotionally abused: Not on file    Physically abused: Not on file    Forced  sexual activity: Not on file  Other Topics Concern  . Not on file  Social History Narrative   Has living will   Husband is health care POA--- alternate is daughter [name] (then son [name])   Discussed DNR--she requests (done 11/30/12)   No tube feedings if cognitively unaware    Family History  Problem Relation Age of Onset  . Heart disease Mother   . Heart disease Father        Heart Disease before age 80  . Heart attack Father   . Hyperlipidemia Father   . Heart disease Sister   . Diabetes Sister   . Heart disease Brother   . Hyperlipidemia Brother   . Heart disease Brother   . Cancer Brother     Allergies  Allergen Reactions  . Lipitor [Atorvastatin]     Dizzy ( fuzziness)  . Latex Rash    Medication list reviewed and updated in full in [facility].  GEN: no acute illness or fever CV: No chest pain or shortness of breath MSK: detailed above Neuro: neurological signs are described above ROS O/w per HPI  Objective:   BP 120/70   Pulse 61   Temp (!) 97.5 F (36.4 C) (Oral)   Ht 5\' 6"  (1.676 m)   Wt 188 lb 8 oz (85.5 kg)   BMI 30.42 kg/m    GEN: Well-developed,well-nourished,in no acute distress; alert,appropriate and cooperative throughout examination HEENT: Normocephalic and atraumatic without obvious abnormalities. Ears, externally no deformities PULM: Breathing comfortably in no respiratory distress EXT: No clubbing, cyanosis, or edema PSYCH: Normally interactive. Cooperative during the interview. Pleasant. Friendly and conversant. Not anxious or depressed appearing. Normal, full affect.  Range of motion at  the waist: Flexion, extension, lateral bending and rotation: Patient has limitation in all directions with flexion, extension, as well as bending and rotational movements of her back.  No echymosis or edema Rises to examination table with mild difficulty Gait: minimally antalgic  Inspection/Deformity: N Paraspinus Tenderness: From L1-S1  bilaterally.  B Ankle Dorsiflexion (L5,4): 5/5 B Great Toe Dorsiflexion (L5,4): 5/5 Heel Walk (L5): WNL Toe Walk (S1): WNL Rise/Squat (L4): WNL, mild pain  SENSORY B Medial Foot (L4): WNL B Dorsum (L5): WNL B Lateral (S1): WNL Light Touch: WNL Pinprick: WNL  REFLEXES Knee (L4): 2+ Ankle (S1): 2+  B SLR, seated: neg B SLR, supine: neg B Greater Troch: NT B Log Roll: neg B Sciatic Notch: ttp b  Radiology: No results found.  Assessment and Plan:   Acute bilateral low back pain without sciatica - Plan: DG Lumbar Spine Complete  History of lumbar fusion - Plan: DG Lumbar Spine Complete  Polymyalgia rheumatica ([place])  With  her history, I am going to check a lumbar spine film.  I suspect that this is all secondary to mechanical issues after lifting her husband more recently after his recent discharge from the hospital.  I am going to increase her prednisone dosing that she is taking chronically for polymyalgia rheumatica, and then taper the dose down.  Follow-up: No follow-ups on file.  Meds ordered this encounter  Medications  . predniSONE (DELTASONE) 10 MG tablet    Sig: 4 tabs po for 4 days, then 3 tabs po for 4 days, then 2 tabs po for 4 days, then 1 tab po for 4 days    Dispense:  40 tablet    Refill:  0   Orders Placed This Encounter  Procedures  . DG Lumbar Spine Complete    Signed,  Aristidis Talerico T. Jayd Forrey, MD   Allergies as of 11/26/2017      Reactions   Lipitor [atorvastatin]    Dizzy ( fuzziness)   Latex Rash      Medication List        Accurate as of 11/26/17  7:09 PM. Always use your most recent med list.          furosemide 20 MG tablet Commonly known as:  LASIX TAKE 2 TABLETS BY MOUTH EVERY DAY   nadolol 40 MG tablet Commonly known as:  CORGARD TAKE 1 TABLET BY MOUTH EVERY DAY   predniSONE 5 MG tablet Commonly known as:  DELTASONE Take 1 tablet (5 mg total) by mouth every other day.   predniSONE 10 MG tablet Commonly known as:   DELTASONE 4 tabs po for 4 days, then 3 tabs po for 4 days, then 2 tabs po for 4 days, then 1 tab po for 4 days

## 2017-12-01 ENCOUNTER — Ambulatory Visit: Payer: Medicare Other | Admitting: Family

## 2017-12-01 ENCOUNTER — Encounter (HOSPITAL_COMMUNITY): Payer: Medicare Other

## 2017-12-24 ENCOUNTER — Encounter: Payer: Medicare Other | Admitting: Family

## 2017-12-24 ENCOUNTER — Encounter: Payer: Self-pay | Admitting: Family

## 2017-12-24 ENCOUNTER — Other Ambulatory Visit: Payer: Self-pay

## 2017-12-24 ENCOUNTER — Ambulatory Visit (HOSPITAL_COMMUNITY)
Admission: RE | Admit: 2017-12-24 | Discharge: 2017-12-24 | Disposition: A | Discharge status: Home / Self Care | Payer: Medicare Other | Source: Ambulatory Visit | Attending: Family | Admitting: Family

## 2017-12-24 VITALS — BP 129/71 | HR 148 | Temp 97.1°F | Resp 14 | Ht 66.5 in | Wt 184.0 lb

## 2017-12-24 DIAGNOSIS — I723 Aneurysm of iliac artery: Secondary | ICD-10-CM | POA: Diagnosis not present

## 2017-12-24 DIAGNOSIS — E785 Hyperlipidemia, unspecified: Secondary | ICD-10-CM | POA: Insufficient documentation

## 2017-12-24 DIAGNOSIS — I10 Essential (primary) hypertension: Secondary | ICD-10-CM | POA: Diagnosis not present

## 2017-12-24 NOTE — Progress Notes (Signed)
Patient appointment with me was for 9:45. By 9:20 she had left before I could evaluate her. I was informed by an Web designer that pt left to be with her husband in the hospital.   She is being monitored for an incidentally found iliac artery aneurysm. She underwent CT scan for degenerative disc disease and was found to have a 2 cm right common iliac artery aneurysm.   Prominent popliteal pulses noted at a previous visit: Bilateral popliteal artery duplex on 01/26/15 demonstrated no evidence of popliteal aneurysms.     Previous AAA Duplex (02/03/2017): Right CIA: 2.32 cm x 2.16 cm. No evidence of AAA. Left CIA at 1.4 cm x 1.4 cm  Current AAA Duplex (12-24-17): largest diameter of abdominal aorta: 2.7 cm proximally; Right CIA: 2.2 cm; Left CIA: 1.5 cm  No AAA, stable bilateral CIA diameters.   I spoke with pt by phone at 6:30 pm today, she denies any abdominal pain or back pain. Her husband is at home and is improving.    Consideration for repair of a common iliac artery aneurysm would be made when the size is 3.0 cm, growth > 1 cm/yr, and symptomatic status.   Based on this patient's diagnostic studies and after speaking with her by phone, the patient will follow up in 1 yearwith the following studies: bilateral aortoiliac duplex and see me.   I will message the [facility] administrative pool to call pt tomorrow to schedule a follow up appointment for a year with bilateral aortoiliac duplex and see me.

## 2017-12-24 NOTE — Progress Notes (Signed)
Vitals:   12/24/17 0835  BP: (!) 141/74  Pulse: (!) 148  Resp: 14  Temp: (!) 97.1 F (36.2 C)  TempSrc: Oral  SpO2: 97%  Weight: 184 lb (83.5 kg)  Height: 5' 6.5" (1.689 m)

## 2017-12-25 ENCOUNTER — Other Ambulatory Visit: Payer: Self-pay | Admitting: Internal Medicine

## 2017-12-25 NOTE — Telephone Encounter (Signed)
Patient is calling to check on the status of this medication, she states she requested it 2 days ago at the pharmacy and is unsure why it just came through. Patient is currently out.

## 2018-01-06 NOTE — Telephone Encounter (Signed)
Spoke to pt. She is going to stay off of it for now.

## 2018-03-01 ENCOUNTER — Telehealth: Payer: Self-pay

## 2018-03-01 NOTE — Telephone Encounter (Signed)
Patient called in to report that she has been feeling "fuzzy" and "dizzy" for quite a while now and feels like it is time to get it checked out.  She denies any chest pain, SOB, numbness/tingling, or vision changes. She is alert and oriented with no reported changes in cognition.  She does feel she keeps her self well hydrated and describes the dizziness as just a general dizziness without any room spinning or vertigo type symptoms.  She has no way of checking her blood pressure at home and refuses to see any other doctor but Dr. Silvio Pate as I did offer her sooner appointment tomorrow with another provider.  Appointment has been made with Dr. Silvio Pate for 4 pm on Thursday 03/04/18 as his next available opening.  She has been instructed to go to urgent care/ER if symptoms progress or she develops any new symptoms with cva-like or MI-type symptoms.    Also, patient is to obtain a meter to begin checking bp's at home or to go to the pharmacy to have checked routinely between now and Thursday to obtain some baseline readings.  Patient verbalizes understanding and agrees to plan.

## 2018-03-01 NOTE — Telephone Encounter (Signed)
Spoke to pt. She was happy to take the Wednesday appt.

## 2018-03-01 NOTE — Telephone Encounter (Signed)
Can offer 12:30 on Wednesday---I can't add any tomorrow due to a meeting

## 2018-03-03 ENCOUNTER — Encounter: Payer: Self-pay | Admitting: Internal Medicine

## 2018-03-03 ENCOUNTER — Ambulatory Visit (INDEPENDENT_AMBULATORY_CARE_PROVIDER_SITE_OTHER): Payer: Medicare Other | Admitting: Internal Medicine

## 2018-03-03 VITALS — BP 120/72 | HR 40 | Temp 97.5°F | Ht 66.0 in | Wt 186.0 lb

## 2018-03-03 DIAGNOSIS — R42 Dizziness and giddiness: Secondary | ICD-10-CM | POA: Diagnosis not present

## 2018-03-03 NOTE — Progress Notes (Signed)
Subjective:    Patient ID: Sydney [name], female    DOB: Jun 11, 1932, 84 y.o.   MRN: [id number]  HPI Here due to dizziness  Felt "fuzzy" in head a bit Worried about her BP Her vision is clear but feels off when she first stands up No vertigo or headache No chest pain or SOB  The "fuzzy" feeling will go away within 10-15 seconds  Current Outpatient Medications on File Prior to Visit  Medication Sig Dispense Refill  . furosemide (LASIX) 20 MG tablet TAKE 2 TABLETS BY MOUTH EVERY DAY 180 tablet 2  . nadolol (CORGARD) 40 MG tablet TAKE 1 TABLET BY MOUTH EVERY DAY 90 tablet 3   No current facility-administered medications on file prior to visit.     Allergies  Allergen Reactions  . Lipitor [Atorvastatin]     Dizzy ( fuzziness)  . Latex Rash    Past Medical History:  Diagnosis Date  . GERD (gastroesophageal reflux disease)   . Hx of lumbosacral spine surgery t  . Hyperlipidemia   . Hypertension   . Iliac artery aneurysm ([place])   . Lumbar spinal stenosis   . Polymyalgia rheumatica ([place]) 06/2016    Past Surgical History:  Procedure Laterality Date  . JOINT REPLACEMENT  1992   right hip replacement  . JOINT REPLACEMENT  2002   left hip replacement  . ROTATOR CUFF REPAIR  2002   Left shoulder  . SPINE SURGERY  2006 & 2010   posterior spinal fusion L4-5 L5S1    Family History  Problem Relation Age of Onset  . Heart disease Mother   . Heart disease Father        Heart Disease before age 76  . Heart attack Father   . Hyperlipidemia Father   . Heart disease Sister   . Diabetes Sister   . Heart disease Brother   . Hyperlipidemia Brother   . Heart disease Brother   . Cancer Brother     Social History   Socioeconomic History  . Marital status: Married    Spouse name: Not on file  . Number of children: Not on file  . Years of education: Not on file  . Highest education level: Not on file  Occupational History  . Not on file  Social Needs  . Financial  resource strain: Not on file  . Food insecurity:    Worry: Not on file    Inability: Not on file  . Transportation needs:    Medical: Not on file    Non-medical: Not on file  Tobacco Use  . Smoking status: Never Smoker  . Smokeless tobacco: Never Used  Substance and Sexual Activity  . Alcohol use: No    Alcohol/week: 0.0 standard drinks  . Drug use: No  . Sexual activity: Not on file  Lifestyle  . Physical activity:    Days per week: Not on file    Minutes per session: Not on file  . Stress: Not on file  Relationships  . Social connections:    Talks on phone: Not on file    Gets together: Not on file    Attends religious service: Not on file    Active member of club or organization: Not on file    Attends meetings of clubs or organizations: Not on file    Relationship status: Not on file  . Intimate partner violence:    Fear of current or ex partner: Not on file    Emotionally  abused: Not on file    Physically abused: Not on file    Forced sexual activity: Not on file  Other Topics Concern  . Not on file  Social History Narrative   Has living will   Husband is health care POA--- alternate is daughter [name] (then son [name])   Discussed DNR--she requests (done 11/30/12)   No tube feedings if cognitively unaware   Review of Systems Eating fine Keeps up with fluids    Objective:   Physical Exam  Constitutional: She appears well-developed. No distress.  Neck: No thyromegaly present.  Cardiovascular: Normal rate, regular rhythm and normal heart sounds. Exam reveals no gallop.  No murmur heard. Respiratory: Effort normal and breath sounds normal. No respiratory distress. She has no wheezes. She has no rales.  Musculoskeletal: She exhibits no edema.  Lymphadenopathy:    She has no cervical adenopathy.           Assessment & Plan:

## 2018-03-03 NOTE — Assessment & Plan Note (Signed)
Vague BP is fine Might be slight orthostatic symptoms---but not severe enough to act on Reassured for now She will monitor

## 2018-03-04 ENCOUNTER — Ambulatory Visit: Payer: Medicare Other | Admitting: Internal Medicine

## 2018-06-26 ENCOUNTER — Other Ambulatory Visit: Payer: Self-pay | Admitting: Internal Medicine

## 2018-09-13 ENCOUNTER — Ambulatory Visit (INDEPENDENT_AMBULATORY_CARE_PROVIDER_SITE_OTHER): Payer: Medicare Other | Admitting: Internal Medicine

## 2018-09-13 ENCOUNTER — Encounter: Payer: Self-pay | Admitting: Internal Medicine

## 2018-09-13 VITALS — Wt 178.0 lb

## 2018-09-13 DIAGNOSIS — I723 Aneurysm of iliac artery: Secondary | ICD-10-CM

## 2018-09-13 DIAGNOSIS — Z0000 Encounter for general adult medical examination without abnormal findings: Secondary | ICD-10-CM

## 2018-09-13 DIAGNOSIS — M159 Polyosteoarthritis, unspecified: Secondary | ICD-10-CM

## 2018-09-13 DIAGNOSIS — E785 Hyperlipidemia, unspecified: Secondary | ICD-10-CM

## 2018-09-13 DIAGNOSIS — I10 Essential (primary) hypertension: Secondary | ICD-10-CM

## 2018-09-13 DIAGNOSIS — M353 Polymyalgia rheumatica: Secondary | ICD-10-CM | POA: Diagnosis not present

## 2018-09-13 DIAGNOSIS — Z7189 Other specified counseling: Secondary | ICD-10-CM

## 2018-09-13 NOTE — Assessment & Plan Note (Signed)
Has DNR 

## 2018-09-13 NOTE — Assessment & Plan Note (Signed)
I have personally reviewed the Medicare Annual Wellness questionnaire and have noted 1. The patient's medical and social history 2. Their use of alcohol, tobacco or illicit drugs 3. Their current medications and supplements 4. The patient's functional ability including ADL's, fall risks, home safety risks and hearing or visual             impairment. 5. Diet and physical activities 6. Evidence for depression or mood disorders  The patients weight, height, BMI and visual acuity have been recorded in the chart I have made referrals, counseling and provided education to the patient based review of the above and I have provided the pt with a written personalized care plan for preventive services.  I have provided you with a copy of your personalized plan for preventive services. Please take the time to review along with your updated medication list.  No cancer screening due to age Discussed fitness---but sig limitations due to caring for her invalid husband Flu vaccine in the fall

## 2018-09-13 NOTE — Progress Notes (Addendum)
Subjective:    Patient ID: Sydney [name], female    DOB: 1932/06/18, 85 y.o.   MRN: [id number]  HPI Visit for Medicare wellness and follow up of chronic health conditions  Interactive audio and video telecommunications were attempted between this provider and patient, however failed, due to patient having technical difficulties OR patient did not have access to video capability.  We continued and completed visit with audio only.   Virtual Visit via Video Note  I connected with Sydney [name] on 09/13/18 at 11:30 AM EDT by a video enabled telemedicine application and verified that I am speaking with the correct person using two identifiers.  Location: Patient: home Provider: Office   I discussed the limitations of evaluation and management by telemedicine and the availability of in person appointments. The patient expressed understanding and agreed to proceed.  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 Not anhedonic Independent with instrumental ADLs---and does all the caregiving for husband No problems with memory Tries to use her NuStep 20 minutes several times a week--some walking  BP has been okay Did have a brief episode of chest pain--thinks it was heartburn (better with med for this) No SOB No dizziness or syncope No sig edema No headaches  Reviewed last vascular study in September Iliac artery aneurysm is stable  No apparent recurrence of PMR symptoms No aching, etc No fevers  Known HLD She is not excited about taking a different statin  Current Outpatient Medications on File Prior to Visit   Medication Sig Dispense Refill  . acetaminophen (TYLENOL) 500 MG tablet Take 500 mg by mouth as needed.    . furosemide (LASIX) 20 MG tablet TAKE 2 TABLETS BY MOUTH EVERY DAY 180 tablet 0  . nadolol (CORGARD) 40 MG tablet TAKE 1 TABLET BY MOUTH EVERY DAY 90 tablet 3   No current facility-administered medications on file prior to visit.     Allergies  Allergen Reactions  . Lipitor [Atorvastatin]     Dizzy ( fuzziness)  . Latex Rash    Past Medical History:  Diagnosis Date  . GERD (gastroesophageal reflux disease)   . Hx of lumbosacral spine surgery t  . Hyperlipidemia   . Hypertension   . Iliac artery aneurysm ([place])   . Lumbar spinal stenosis   . Polymyalgia rheumatica ([place]) 06/2016    Past Surgical History:  Procedure Laterality Date  . JOINT REPLACEMENT  1992   right hip replacement  . JOINT REPLACEMENT  2002   left hip replacement  . ROTATOR CUFF REPAIR  2002   Left shoulder  . SPINE SURGERY  2006 & 2010   posterior spinal fusion L4-5 L5S1    Family History  Problem Relation Age of Onset  . Heart disease Mother   . Heart disease Father        Heart Disease before age 15  . Heart attack Father   . Hyperlipidemia Father   . Heart disease Sister   . Diabetes Sister   . Heart disease Brother   . Hyperlipidemia Brother   . Heart disease Brother   . Cancer Brother  Social History   Socioeconomic History  . Marital status: Married    Spouse name: Not on file  . Number of children: Not on file  . Years of education: Not on file  . Highest education level: Not on file  Occupational History  . Not on file  Social Needs  . Financial resource strain: Not on file  . Food insecurity:    Worry: Not on file    Inability: Not on file  . Transportation needs:    Medical: Not on file    Non-medical: Not on file  Tobacco Use  . Smoking status: Never Smoker  . Smokeless tobacco: Never Used  Substance and Sexual Activity  . Alcohol use: No    Alcohol/week:  0.0 standard drinks  . Drug use: No  . Sexual activity: Not on file  Lifestyle  . Physical activity:    Days per week: Not on file    Minutes per session: Not on file  . Stress: Not on file  Relationships  . Social connections:    Talks on phone: Not on file    Gets together: Not on file    Attends religious service: Not on file    Active member of club or organization: Not on file    Attends meetings of clubs or organizations: Not on file    Relationship status: Not on file  . Intimate partner violence:    Fear of current or ex partner: Not on file    Emotionally abused: Not on file    Physically abused: Not on file    Forced sexual activity: Not on file  Other Topics Concern  . Not on file  Social History Narrative   Has living will   Husband is health care POA--- alternate is daughter [name] (then son [name])   Discussed DNR--she requests (done 11/30/12)   No tube feedings if cognitively unaware   ROS Appetite is fine Has lost about 10# recently--eating better Sleeps fine Wears seat belt in car Bowels are fine--no blood Voids fine--no blood Some right shoulder arthritic pain---uses diclofenac gel with success. Uses tylenol prn No skin rash or ulcers   Observations/Objective: Alert and oriented x 3 President--- "Daisy Floro, Barack Quay Burow" [phone] D-[id number] Recall 3/3   Assessment and Plan:   Follow Up Instructions:    I discussed the assessment and treatment plan with the patient. The patient was provided an opportunity to ask questions and all were answered. The patient agreed with the plan and demonstrated an understanding of the instructions.   The patient was advised to call back or seek an in-person evaluation if the symptoms worsen or if the condition fails to improve as anticipated.  I provided 21 minutes of non-face-to-face time during this encounter.  Approximately 6 -7 minutes spent for AMW and the remainder in the E&M  portion   Viviana Simpler, MD    Review of Systems     Objective:   Physical Exam         Assessment & Plan:

## 2018-09-13 NOTE — Assessment & Plan Note (Signed)
Still seems to be in remission Will check sed rate 

## 2018-09-13 NOTE — Assessment & Plan Note (Signed)
Mild and mostly just 1 shoulder Diclofenac gel and tylenol prn

## 2018-09-13 NOTE — Assessment & Plan Note (Signed)
BP Readings from Last 3 Encounters:  03/03/18 120/72  12/24/17 129/71  11/26/17 120/70   Generally well controlled I will plan to check when I next see her husband for home visit (expected in July)

## 2018-09-13 NOTE — Assessment & Plan Note (Signed)
Stable on last check in September On yearly follow up with Dr Donnetta Hutching

## 2018-09-13 NOTE — Assessment & Plan Note (Signed)
Discussed trying another statin We decided against it

## 2018-09-19 ENCOUNTER — Other Ambulatory Visit: Payer: Self-pay | Admitting: Internal Medicine

## 2018-09-21 ENCOUNTER — Other Ambulatory Visit (INDEPENDENT_AMBULATORY_CARE_PROVIDER_SITE_OTHER): Payer: Medicare Other

## 2018-09-21 DIAGNOSIS — M353 Polymyalgia rheumatica: Secondary | ICD-10-CM

## 2018-09-21 DIAGNOSIS — I10 Essential (primary) hypertension: Secondary | ICD-10-CM | POA: Diagnosis not present

## 2018-09-21 LAB — LIPID PANEL
Cholesterol: 294 mg/dL — ABNORMAL HIGH (ref 0–200)
HDL: 59 mg/dL (ref >39.00)
LDL Cholesterol: 199 mg/dL — ABNORMAL HIGH (ref 0–99)
NonHDL: 234.94
Total CHOL/HDL Ratio: 5
Triglycerides: 180 mg/dL — ABNORMAL HIGH (ref 0.0–149.0)
VLDL: 36 mg/dL (ref 0.0–40.0)

## 2018-09-21 LAB — COMPREHENSIVE METABOLIC PANEL
ALT: 10 U/L (ref 0–35)
AST: 12 U/L (ref 0–37)
Albumin: 4.1 g/dL (ref 3.5–5.2)
Alkaline Phosphatase: 56 U/L (ref 39–117)
BUN: 19 mg/dL (ref 6–23)
CO2: 27 mEq/L (ref 19–32)
Calcium: 9.7 mg/dL (ref 8.4–10.5)
Chloride: 105 mEq/L (ref 96–112)
Creatinine, Ser: 0.83 mg/dL (ref 0.40–1.20)
GFR: 65.48 mL/min (ref >60.00)
Glucose, Bld: 111 mg/dL — ABNORMAL HIGH (ref 70–99)
Potassium: 4 mEq/L (ref 3.5–5.1)
Sodium: 140 mEq/L (ref 135–145)
Total Bilirubin: 0.6 mg/dL (ref 0.2–1.2)
Total Protein: 6.5 g/dL (ref 6.0–8.3)

## 2018-09-21 LAB — CBC
HCT: 44.9 % (ref 36.0–46.0)
Hemoglobin: 14.7 g/dL (ref 12.0–15.0)
MCHC: 32.8 g/dL (ref 30.0–36.0)
MCV: 93.6 fl (ref 78.0–100.0)
Platelets: 217 10*3/uL (ref 150.0–400.0)
RBC: 4.79 Mil/uL (ref 3.87–5.11)
RDW: 13.7 % (ref 11.5–15.5)
WBC: 6.2 10*3/uL (ref 4.0–10.5)

## 2018-09-21 LAB — SEDIMENTATION RATE: Sed Rate: 17 mm/hr (ref 0–30)

## 2018-10-20 ENCOUNTER — Telehealth: Payer: Self-pay | Admitting: Internal Medicine

## 2018-10-20 NOTE — Telephone Encounter (Signed)
In her home for visit with husband Checked BP 132/78

## 2018-10-24 ENCOUNTER — Other Ambulatory Visit: Payer: Self-pay | Admitting: Internal Medicine

## 2018-12-29 ENCOUNTER — Ambulatory Visit (INDEPENDENT_AMBULATORY_CARE_PROVIDER_SITE_OTHER): Payer: Medicare Other

## 2018-12-29 ENCOUNTER — Other Ambulatory Visit: Payer: Self-pay

## 2018-12-29 DIAGNOSIS — Z23 Encounter for immunization: Secondary | ICD-10-CM | POA: Diagnosis not present

## 2019-03-04 ENCOUNTER — Encounter: Payer: Self-pay | Admitting: Internal Medicine

## 2019-03-04 ENCOUNTER — Other Ambulatory Visit: Payer: Self-pay

## 2019-03-04 ENCOUNTER — Ambulatory Visit (INDEPENDENT_AMBULATORY_CARE_PROVIDER_SITE_OTHER): Payer: Medicare Other | Admitting: Internal Medicine

## 2019-03-04 DIAGNOSIS — S335XXA Sprain of ligaments of lumbar spine, initial encounter: Secondary | ICD-10-CM | POA: Diagnosis not present

## 2019-03-04 MED ORDER — TIZANIDINE HCL 2 MG PO TABS: 2.0000 mg | ORAL_TABLET | Freq: Three times a day (TID) | ORAL | 0 refills | Status: DC | PRN

## 2019-03-04 NOTE — Assessment & Plan Note (Signed)
Clearly muscular Continue oral tylenol, topical diclofenac and lidocaine Will give tizanidine in case another bad day

## 2019-03-04 NOTE — Progress Notes (Signed)
Subjective:    Patient ID: Sydney [name], female    DOB: 02/02/1933, 85 y.o.   MRN: [id number]  HPI Here due to back pain Started about 3.5 weeks ago--but got so bad a few days ago (nausea with pulsing pain) Centered around right CVA area Uses Salon-pas and voltaren gel---seems to help some  No radiation of pain (but does have separate pain in shoulders and right elbow) No loss of bladder or bowel control  Current Outpatient Medications on File Prior to Visit  Medication Sig Dispense Refill  . acetaminophen (TYLENOL) 500 MG tablet Take 500 mg by mouth as needed.    . furosemide (LASIX) 20 MG tablet TAKE 2 TABLETS BY MOUTH EVERY DAY 180 tablet 3  . nadolol (CORGARD) 40 MG tablet TAKE 1 TABLET BY MOUTH EVERY DAY 90 tablet 3   No current facility-administered medications on file prior to visit.     Allergies  Allergen Reactions  . Lipitor [Atorvastatin]     Dizzy ( fuzziness)  . Latex Rash    Past Medical History:  Diagnosis Date  . GERD (gastroesophageal reflux disease)   . Hx of lumbosacral spine surgery t  . Hyperlipidemia   . Hypertension   . Iliac artery aneurysm ([place])   . Lumbar spinal stenosis   . Polymyalgia rheumatica ([place]) 06/2016    Past Surgical History:  Procedure Laterality Date  . JOINT REPLACEMENT  1992   right hip replacement  . JOINT REPLACEMENT  2002   left hip replacement  . ROTATOR CUFF REPAIR  2002   Left shoulder  . SPINE SURGERY  2006 & 2010   posterior spinal fusion L4-5 L5S1    Family History  Problem Relation Age of Onset  . Heart disease Mother   . Heart disease Father        Heart Disease before age 11  . Heart attack Father   . Hyperlipidemia Father   . Heart disease Sister   . Diabetes Sister   . Heart disease Brother   . Hyperlipidemia Brother   . Heart disease Brother   . Cancer Brother     Social History   Socioeconomic History  . Marital status: Married    Spouse name: Not on file  . Number of children: Not on  file  . Years of education: Not on file  . Highest education level: Not on file  Occupational History  . Not on file  Social Needs  . Financial resource strain: Not on file  . Food insecurity    Worry: Not on file    Inability: Not on file  . Transportation needs    Medical: Not on file    Non-medical: Not on file  Tobacco Use  . Smoking status: Never Smoker  . Smokeless tobacco: Never Used  Substance and Sexual Activity  . Alcohol use: No    Alcohol/week: 0.0 standard drinks  . Drug use: No  . Sexual activity: Not on file  Lifestyle  . Physical activity    Days per week: Not on file    Minutes per session: Not on file  . Stress: Not on file  Relationships  . Social Herbalist on phone: Not on file    Gets together: Not on file    Attends religious service: Not on file    Active member of club or organization: Not on file    Attends meetings of clubs or organizations: Not on file  Relationship status: Not on file  . Intimate partner violence    Fear of current or ex partner: Not on file    Emotionally abused: Not on file    Physically abused: Not on file    Forced sexual activity: Not on file  Other Topics Concern  . Not on file  Social History Narrative   Has living will   Husband is health care POA--- alternate is daughter [name] (then son [name])   Discussed DNR--she requests (done 11/30/12)   No tube feedings if cognitively unaware   Review of Systems No fever No dysuria    Objective:   Physical Exam  Constitutional: She appears well-developed. No distress.  Musculoskeletal:     Comments: Tenderness along upper lumbar paraspinals on right No spine tenderness Normal ROM in hips SLR negative  Neurological:  Antalgic gait Symmetric hip flexor weakness           Assessment & Plan:

## 2019-03-23 ENCOUNTER — Telehealth: Payer: Self-pay | Admitting: Internal Medicine

## 2019-03-23 MED ORDER — PANTOPRAZOLE SODIUM 40 MG PO TBEC: 40.0000 mg | DELAYED_RELEASE_TABLET | Freq: Every day | ORAL | 3 refills | Status: DC

## 2019-03-23 NOTE — Telephone Encounter (Signed)
Seen at her husband's home visit Has recurrence of ulcer like symptoms---awakening with pain in the pit of her stomach. Better after eating or yogurt Will restart PPI GI evaluation if symptoms persist

## 2019-04-22 ENCOUNTER — Other Ambulatory Visit: Payer: Self-pay | Admitting: Internal Medicine

## 2019-04-23 NOTE — Telephone Encounter (Signed)
Last filled 03-04-19 #30 Last OV 09-13-18 Next OV 09-14-19 [facility]

## 2019-05-26 ENCOUNTER — Other Ambulatory Visit: Payer: Self-pay | Admitting: Internal Medicine

## 2019-06-29 DIAGNOSIS — Z96649 Presence of unspecified artificial hip joint: Secondary | ICD-10-CM | POA: Diagnosis not present

## 2019-06-29 DIAGNOSIS — M1990 Unspecified osteoarthritis, unspecified site: Secondary | ICD-10-CM | POA: Diagnosis not present

## 2019-06-29 DIAGNOSIS — R609 Edema, unspecified: Secondary | ICD-10-CM | POA: Diagnosis not present

## 2019-06-29 DIAGNOSIS — I10 Essential (primary) hypertension: Secondary | ICD-10-CM | POA: Diagnosis not present

## 2019-06-29 DIAGNOSIS — Z8249 Family history of ischemic heart disease and other diseases of the circulatory system: Secondary | ICD-10-CM | POA: Diagnosis not present

## 2019-06-29 DIAGNOSIS — K219 Gastro-esophageal reflux disease without esophagitis: Secondary | ICD-10-CM | POA: Diagnosis not present

## 2019-09-02 ENCOUNTER — Emergency Department (HOSPITAL_COMMUNITY): Payer: Medicare PPO

## 2019-09-02 ENCOUNTER — Emergency Department (HOSPITAL_COMMUNITY): Admit: 2019-09-02 | Payer: Medicare PPO | Source: Home / Self Care

## 2019-09-02 ENCOUNTER — Other Ambulatory Visit: Payer: Self-pay

## 2019-09-02 ENCOUNTER — Encounter (HOSPITAL_COMMUNITY): Payer: Self-pay

## 2019-09-02 ENCOUNTER — Observation Stay (HOSPITAL_COMMUNITY)
Admission: EM | Admit: 2019-09-02 | Discharge: 2019-09-06 | Disposition: A | Discharge status: Skilled Nursing Facility | Payer: Medicare PPO | Attending: Internal Medicine | Admitting: Internal Medicine

## 2019-09-02 DIAGNOSIS — Z6829 Body mass index (BMI) 29.0-29.9, adult: Secondary | ICD-10-CM | POA: Diagnosis not present

## 2019-09-02 DIAGNOSIS — Y9389 Activity, other specified: Secondary | ICD-10-CM | POA: Insufficient documentation

## 2019-09-02 DIAGNOSIS — Z809 Family history of malignant neoplasm, unspecified: Secondary | ICD-10-CM | POA: Insufficient documentation

## 2019-09-02 DIAGNOSIS — M810 Age-related osteoporosis without current pathological fracture: Secondary | ICD-10-CM

## 2019-09-02 DIAGNOSIS — Z8349 Family history of other endocrine, nutritional and metabolic diseases: Secondary | ICD-10-CM | POA: Diagnosis not present

## 2019-09-02 DIAGNOSIS — Z7901 Long term (current) use of anticoagulants: Secondary | ICD-10-CM | POA: Insufficient documentation

## 2019-09-02 DIAGNOSIS — Z981 Arthrodesis status: Secondary | ICD-10-CM | POA: Diagnosis not present

## 2019-09-02 DIAGNOSIS — W010XXA Fall on same level from slipping, tripping and stumbling without subsequent striking against object, initial encounter: Secondary | ICD-10-CM | POA: Diagnosis not present

## 2019-09-02 DIAGNOSIS — I10 Essential (primary) hypertension: Secondary | ICD-10-CM | POA: Insufficient documentation

## 2019-09-02 DIAGNOSIS — Z7982 Long term (current) use of aspirin: Secondary | ICD-10-CM | POA: Insufficient documentation

## 2019-09-02 DIAGNOSIS — M353 Polymyalgia rheumatica: Secondary | ICD-10-CM | POA: Diagnosis not present

## 2019-09-02 DIAGNOSIS — E669 Obesity, unspecified: Secondary | ICD-10-CM | POA: Diagnosis not present

## 2019-09-02 DIAGNOSIS — R52 Pain, unspecified: Secondary | ICD-10-CM | POA: Diagnosis not present

## 2019-09-02 DIAGNOSIS — I723 Aneurysm of iliac artery: Secondary | ICD-10-CM | POA: Diagnosis not present

## 2019-09-02 DIAGNOSIS — W19XXXA Unspecified fall, initial encounter: Secondary | ICD-10-CM | POA: Diagnosis not present

## 2019-09-02 DIAGNOSIS — S32602D Unspecified fracture of left ischium, subsequent encounter for fracture with routine healing: Secondary | ICD-10-CM

## 2019-09-02 DIAGNOSIS — Z03818 Encounter for observation for suspected exposure to other biological agents ruled out: Secondary | ICD-10-CM | POA: Diagnosis not present

## 2019-09-02 DIAGNOSIS — R2681 Unsteadiness on feet: Secondary | ICD-10-CM | POA: Insufficient documentation

## 2019-09-02 DIAGNOSIS — M25552 Pain in left hip: Secondary | ICD-10-CM | POA: Diagnosis not present

## 2019-09-02 DIAGNOSIS — Z96643 Presence of artificial hip joint, bilateral: Secondary | ICD-10-CM | POA: Insufficient documentation

## 2019-09-02 DIAGNOSIS — Z79899 Other long term (current) drug therapy: Secondary | ICD-10-CM | POA: Diagnosis not present

## 2019-09-02 DIAGNOSIS — S3992XA Unspecified injury of lower back, initial encounter: Secondary | ICD-10-CM | POA: Diagnosis not present

## 2019-09-02 DIAGNOSIS — Z8249 Family history of ischemic heart disease and other diseases of the circulatory system: Secondary | ICD-10-CM | POA: Insufficient documentation

## 2019-09-02 DIAGNOSIS — S32602A Unspecified fracture of left ischium, initial encounter for closed fracture: Secondary | ICD-10-CM | POA: Diagnosis not present

## 2019-09-02 DIAGNOSIS — Z91040 Latex allergy status: Secondary | ICD-10-CM | POA: Insufficient documentation

## 2019-09-02 DIAGNOSIS — Z888 Allergy status to other drugs, medicaments and biological substances status: Secondary | ICD-10-CM | POA: Insufficient documentation

## 2019-09-02 DIAGNOSIS — E785 Hyperlipidemia, unspecified: Secondary | ICD-10-CM | POA: Diagnosis not present

## 2019-09-02 DIAGNOSIS — K219 Gastro-esophageal reflux disease without esophagitis: Secondary | ICD-10-CM | POA: Insufficient documentation

## 2019-09-02 DIAGNOSIS — S329XXA Fracture of unspecified parts of lumbosacral spine and pelvis, initial encounter for closed fracture: Secondary | ICD-10-CM | POA: Diagnosis present

## 2019-09-02 DIAGNOSIS — Z20822 Contact with and (suspected) exposure to covid-19: Secondary | ICD-10-CM | POA: Insufficient documentation

## 2019-09-02 DIAGNOSIS — Z833 Family history of diabetes mellitus: Secondary | ICD-10-CM | POA: Diagnosis not present

## 2019-09-02 LAB — CBC WITH DIFFERENTIAL/PLATELET
Abs Immature Granulocytes: 0.02 10*3/uL (ref 0.00–0.07)
Basophils Absolute: 0 10*3/uL (ref 0.0–0.1)
Basophils Relative: 0 %
Eosinophils Absolute: 0.1 10*3/uL (ref 0.0–0.5)
Eosinophils Relative: 1 %
HCT: 47.9 % — ABNORMAL HIGH (ref 36.0–46.0)
Hemoglobin: 15.4 g/dL — ABNORMAL HIGH (ref 12.0–15.0)
Immature Granulocytes: 0 %
Lymphocytes Relative: 13 %
Lymphs Abs: 1.1 10*3/uL (ref 0.7–4.0)
MCH: 31 pg (ref 26.0–34.0)
MCHC: 32.2 g/dL (ref 30.0–36.0)
MCV: 96.6 fL (ref 80.0–100.0)
Monocytes Absolute: 0.9 10*3/uL (ref 0.1–1.0)
Monocytes Relative: 11 %
Neutro Abs: 6.3 10*3/uL (ref 1.7–7.7)
Neutrophils Relative %: 75 %
Platelets: 207 10*3/uL (ref 150–400)
RBC: 4.96 MIL/uL (ref 3.87–5.11)
RDW: 13.6 % (ref 11.5–15.5)
WBC: 8.4 10*3/uL (ref 4.0–10.5)
nRBC: 0 % (ref 0.0–0.2)

## 2019-09-02 LAB — BASIC METABOLIC PANEL
Anion gap: 12 (ref 5–15)
BUN: 17 mg/dL (ref 8–23)
CO2: 25 mmol/L (ref 22–32)
Calcium: 9.8 mg/dL (ref 8.9–10.3)
Chloride: 104 mmol/L (ref 98–111)
Creatinine, Ser: 1.01 mg/dL — ABNORMAL HIGH (ref 0.44–1.00)
GFR calc Af Amer: 59 mL/min — ABNORMAL LOW (ref >60)
GFR calc non Af Amer: 51 mL/min — ABNORMAL LOW (ref >60)
Glucose, Bld: 133 mg/dL — ABNORMAL HIGH (ref 70–99)
Potassium: 3.7 mmol/L (ref 3.5–5.1)
Sodium: 141 mmol/L (ref 135–145)

## 2019-09-02 LAB — SARS CORONAVIRUS 2 BY RT PCR (HOSPITAL ORDER, PERFORMED IN ~~LOC~~ HOSPITAL LAB): SARS Coronavirus 2: NEGATIVE

## 2019-09-02 LAB — VITAMIN D 25 HYDROXY (VIT D DEFICIENCY, FRACTURES): Vit D, 25-Hydroxy: 11.47 ng/mL — ABNORMAL LOW (ref 30–100)

## 2019-09-02 MED ORDER — HYDROMORPHONE HCL 1 MG/ML IJ SOLN: 0.5000 mg | INTRAMUSCULAR | Status: DC | PRN

## 2019-09-02 MED ORDER — HYDROCODONE-ACETAMINOPHEN 5-325 MG PO TABS
1.0000 | ORAL_TABLET | Freq: Once | ORAL | Status: AC
  Administered 2019-09-02: 1 via ORAL
  Filled 2019-09-02: qty 1

## 2019-09-02 MED ORDER — ENOXAPARIN SODIUM 40 MG/0.4ML ~~LOC~~ SOLN
40.0000 mg | SUBCUTANEOUS | Status: DC
  Administered 2019-09-02 – 2019-09-06 (×5): 40 mg via SUBCUTANEOUS
  Filled 2019-09-02 (×5): qty 0.4

## 2019-09-02 MED ORDER — VITAMIN D (ERGOCALCIFEROL) 1.25 MG (50000 UNIT) PO CAPS: 50000.0000 [IU] | ORAL_CAPSULE | ORAL | Status: DC

## 2019-09-02 MED ORDER — SENNOSIDES-DOCUSATE SODIUM 8.6-50 MG PO TABS
1.0000 | ORAL_TABLET | Freq: Every evening | ORAL | Status: DC | PRN
  Administered 2019-09-03: 1 via ORAL
  Filled 2019-09-02: qty 1

## 2019-09-02 MED ORDER — FUROSEMIDE 20 MG PO TABS
20.0000 mg | ORAL_TABLET | Freq: Every day | ORAL | Status: DC
  Administered 2019-09-03 – 2019-09-06 (×4): 20 mg via ORAL
  Filled 2019-09-02 (×4): qty 1

## 2019-09-02 MED ORDER — TRAMADOL HCL 50 MG PO TABS: 50.0000 mg | ORAL_TABLET | Freq: Four times a day (QID) | ORAL | 0 refills | Status: DC | PRN

## 2019-09-02 MED ORDER — NADOLOL 40 MG PO TABS
40.0000 mg | ORAL_TABLET | Freq: Every day | ORAL | Status: DC
  Administered 2019-09-03 – 2019-09-06 (×4): 40 mg via ORAL
  Filled 2019-09-02 (×5): qty 1

## 2019-09-02 MED ORDER — CALCIUM CARBONATE 1250 (500 CA) MG PO TABS
1.0000 | ORAL_TABLET | Freq: Two times a day (BID) | ORAL | Status: DC
  Administered 2019-09-02 – 2019-09-06 (×8): 500 mg via ORAL
  Filled 2019-09-02 (×8): qty 1

## 2019-09-02 MED ORDER — VITAMIN D (ERGOCALCIFEROL) 1.25 MG (50000 UNIT) PO CAPS
50000.0000 [IU] | ORAL_CAPSULE | ORAL | Status: DC
  Administered 2019-09-02: 50000 [IU] via ORAL
  Filled 2019-09-02 (×2): qty 1

## 2019-09-02 MED ORDER — HYDROCODONE-ACETAMINOPHEN 5-325 MG PO TABS
1.0000 | ORAL_TABLET | Freq: Four times a day (QID) | ORAL | Status: DC | PRN
  Administered 2019-09-02 – 2019-09-03 (×2): 1 via ORAL
  Administered 2019-09-03: 2 via ORAL
  Administered 2019-09-04: 1 via ORAL
  Administered 2019-09-04: 2 via ORAL
  Administered 2019-09-05 – 2019-09-06 (×3): 1 via ORAL
  Filled 2019-09-02: qty 1
  Filled 2019-09-02: qty 2
  Filled 2019-09-02 (×2): qty 1
  Filled 2019-09-02: qty 2
  Filled 2019-09-02: qty 1
  Filled 2019-09-02: qty 2
  Filled 2019-09-02: qty 1

## 2019-09-02 NOTE — ED Notes (Signed)
Patient has spoken to several family members,

## 2019-09-02 NOTE — ED Provider Notes (Addendum)
[place] EMERGENCY DEPARTMENT Provider Note   CSN: [id number] Arrival date & time:        History Chief Complaint  Patient presents with  . Fall    Sydney [name] is a 86 y.o. female.  Patient status post fall yesterday.  She fell in the bathroom hit her left hip area and buttock on the toilet and since then has been able to walk.  She waited till today because she takes care of her husband.  Patient's complaint is left buttocks left hip and low back pain.  No loss of consciousness did not hit her head.  Patient's past medical history is significant for bilateral hip replacements done about 20 years ago.  She cannot remember which orthopedic doctor it was.  And recently had neurosurgery to a fusion of the lumbar spine area.        Past Medical History:  Diagnosis Date  . GERD (gastroesophageal reflux disease)   . Hx of lumbosacral spine surgery t  . Hyperlipidemia   . Hypertension   . Iliac artery aneurysm ([place])   . Lumbar spinal stenosis   . Polymyalgia rheumatica ([place]) 06/2016    Patient Active Problem List   Diagnosis Date Noted  . Lumbar sprain 03/04/2019  . Polymyalgia rheumatica ([place]) 06/12/2016  . Hyperlipemia 07/23/2015  . Generalized osteoarthritis of multiple sites 07/14/2014  . Advanced directives, counseling/discussion 12/02/2013  . Routine general medical examination at a health care facility 11/30/2012  . GERD (gastroesophageal reflux disease)   . Iliac artery aneurysm, right ([place]) 01/18/2011  . Obesity, unspecified 01/02/2009  . DEGENERATIVE DISC DISEASE, LUMBAR SPINE 01/02/2009  . Essential hypertension, benign 01/14/2007    Past Surgical History:  Procedure Laterality Date  . JOINT REPLACEMENT  1992   right hip replacement  . JOINT REPLACEMENT  2002   left hip replacement  . ROTATOR CUFF REPAIR  2002   Left shoulder  . SPINE SURGERY  2006 & 2010   posterior spinal fusion L4-5 L5S1     OB History   No obstetric history  on file.     Family History  Problem Relation Age of Onset  . Heart disease Mother   . Heart disease Father        Heart Disease before age 3  . Heart attack Father   . Hyperlipidemia Father   . Heart disease Sister   . Diabetes Sister   . Heart disease Brother   . Hyperlipidemia Brother   . Heart disease Brother   . Cancer Brother     Social History   Tobacco Use  . Smoking status: Never Smoker  . Smokeless tobacco: Never Used  Substance Use Topics  . Alcohol use: No    Alcohol/week: 0.0 standard drinks  . Drug use: No    Home Medications Prior to Admission medications   Medication Sig Start Date End Date Taking? Authorizing Provider  acetaminophen (TYLENOL) 500 MG tablet Take 500 mg by mouth as needed for mild pain.    Yes [provider]  furosemide (LASIX) 20 MG tablet TAKE 2 TABLETS BY MOUTH EVERY DAY Patient taking differently: Take 20 mg by mouth daily.  09/20/18  Yes Venia Carbon, MD  nadolol (CORGARD) 40 MG tablet TAKE 1 TABLET BY MOUTH EVERY DAY Patient taking differently: Take 40 mg by mouth daily.  10/25/18  Yes Venia Carbon, MD  pantoprazole (PROTONIX) 40 MG tablet Take 1 tablet (40 mg total) by mouth daily. Patient  not taking: Reported on 09/02/2019 03/23/19   Viviana Simpler I, MD  tiZANidine (ZANAFLEX) 2 MG tablet TAKE 1 TABLET BY MOUTH 3 TIMES A DAY AS NEEDED FOR MUSCLE SPASMS Patient not taking: No sig reported 05/26/19   Venia Carbon, MD  traMADol (ULTRAM) 50 MG tablet Take 1 tablet (50 mg total) by mouth every 6 (six) hours as needed. 09/02/19   Fredia Sorrow, MD    Allergies    Lipitor [atorvastatin] and Latex  Review of Systems   Review of Systems  Constitutional: Negative for chills and fever.  HENT: Negative for rhinorrhea and sore throat.   Eyes: Negative for visual disturbance.  Respiratory: Negative for cough and shortness of breath.   Cardiovascular: Negative for chest pain and leg swelling.  Gastrointestinal:  Negative for abdominal pain, diarrhea, nausea and vomiting.  Genitourinary: Negative for dysuria.  Musculoskeletal: Positive for back pain. Negative for neck pain.  Skin: Negative for rash.  Neurological: Negative for dizziness, light-headedness and headaches.  Hematological: Does not bruise/bleed easily.  Psychiatric/Behavioral: Negative for confusion.    Physical Exam Updated Vital Signs BP (!) 148/70   Pulse 61   Temp 98.5 F (36.9 C) (Oral)   Resp 16   Ht 1.676 m (5\' 6" )   Wt 83 kg   SpO2 97%   BMI 29.53 kg/m   Physical Exam Vitals and nursing note reviewed.  Constitutional:      General: She is not in acute distress.    Appearance: Normal appearance. She is well-developed.  HENT:     Head: Normocephalic and atraumatic.  Eyes:     Extraocular Movements: Extraocular movements intact.     Conjunctiva/sclera: Conjunctivae normal.     Pupils: Pupils are equal, round, and reactive to light.  Cardiovascular:     Rate and Rhythm: Normal rate and regular rhythm.     Heart sounds: No murmur.  Pulmonary:     Effort: Pulmonary effort is normal. No respiratory distress.     Breath sounds: Normal breath sounds.  Abdominal:     Palpations: Abdomen is soft.     Tenderness: There is no abdominal tenderness.  Musculoskeletal:        General: Tenderness present. Normal range of motion.     Cervical back: Normal range of motion and neck supple.     Comments: No significant pain with range of motion of the left hip.  Does have some buttocks tenderness.  And some lower lumbar tenderness.  Distally on the left leg good cap refill.  Sensation intact good movement.  Same for right lower extremity.  Skin:    General: Skin is warm and dry.     Capillary Refill: Capillary refill takes less than 2 seconds.  Neurological:     General: No focal deficit present.     Mental Status: She is alert and oriented to person, place, and time.     ED Results / Procedures / Treatments   Labs (all  labs ordered are listed, but only abnormal results are displayed) Labs Reviewed - No data to display  EKG None  Radiology DG Lumbar Spine Complete  Result Date: 09/02/2019 CLINICAL DATA:  Fall. Pain. EXAM: LUMBAR SPINE - COMPLETE 4+ VIEW COMPARISON:  11/26/2017 FINDINGS: Similar to previous exam, lowest lumbar type vertebral body labeled L5. Stable appearance of posterior fusion from L1-L4 with interbody fusion device at L1-2. Hardware appears intact. Stable 13 millimeters anterolisthesis of L5 on S1. Significant facet hypertrophy and facet fusion. Significant osteophytes  in the LOWER thoracic spine. No acute fracture. Status post bilateral hip arthroplasty. IMPRESSION: 1. Stable appearance of posterior fusion from L1-L4. 2. Stable anterolisthesis of L5 on S1. Electronically Signed   By: Nolon Nations M.D.   On: 09/02/2019 08:39   DG Hip Unilat With Pelvis 2-3 Views Left  Result Date: 09/02/2019 CLINICAL DATA:  Pain following fall EXAM: DG HIP (WITH OR WITHOUT PELVIS) 2-3V LEFT COMPARISON:  December 27, 2015 FINDINGS: Frontal pelvis as well as frontal and lateral left hip images were obtained. There are total hip replacements bilaterally with prosthetic components appearing well-seated. There is a fracture of the mid ischium on the left with slight impaction at the fracture site. No other fracture is appreciable. No dislocation. Bones are osteoporotic. There is postoperative change in the visualized lower lumbar spine. IMPRESSION: Fracture left ischium with mild impaction at the fracture site. Total hip replacements bilaterally with prosthetic components well-seated. No dislocation. Osteoporosis. Electronically Signed   By: Lowella Grip III M.D.   On: 09/02/2019 07:43    Procedures Procedures (including critical care time)  Medications Ordered in ED Medications - No data to display  ED Course  I have reviewed the triage vital signs and the nursing notes.  Pertinent labs & imaging  results that were available during my care of the patient were reviewed by me and considered in my medical decision making (see chart for details).    MDM Rules/Calculators/A&P                      X-rays show left pelvic fracture of the ischium.  No significant displacement.  X-rays of the lumbar back showed no abnormalities.  The left hip replacement without any significant abnormalities.  This is a stable fracture patient can ambulate on we will have her follow-up with orthopedics and her regular doctor.  We will treat her with tramadol to help with the pain.  Patient is already been ambulating and she can do that fine.   Final Clinical Impression(s) / ED Diagnoses Final diagnoses:  Fall, initial encounter  Closed nondisplaced fracture of left ischium, unspecified fracture morphology, initial encounter [facility])    Rx / DC Orders ED Discharge Orders         Ordered    traMADol (ULTRAM) 50 MG tablet  Every 6 hours PRN     09/02/19 0916           Fredia Sorrow, MD 09/02/19 [phone]  Patient was very ambitious about going home. But could not get in the car due to pain. Gave patient some hydrocodone here patient still struggled with walking cannot walk with her cane. Could walk somewhat with a walker. But she still in a lot of pain and only able to take small steps. 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  Discussed with hospitalist whether admit is a possibility. If not may have to get social worker and physical therapy consult.       Fredia Sorrow, MD 09/02/19 1302   I discussed with hospitalist they are willing for overnight admission we will get physical therapy to see her in the morning.  Covid testing is been ordered.  Patient's had the first vaccination was due to get the second 1 today.      Fredia Sorrow, MD 09/02/19 1316

## 2019-09-02 NOTE — ED Triage Notes (Signed)
Pt from home via EMS for fall yesterday (5/20). 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 Pt presents today because L hip pain persists upon movement. Hx bilateral hip replacements, spinal fusions. NAD noted on arrival, VSS via EMS

## 2019-09-02 NOTE — ED Notes (Signed)
Patient was discharged to go home and wasn't able to get into the car , states it was to painful to bear weight.

## 2019-09-02 NOTE — Discharge Instructions (Signed)
Follow-up with orthopedics.  If you are able to determine what group did your hip replacements 20 years ago follow-up with them.  Otherwise given Dr. Bonnee Quin name from orthopedics to follow-up with.  Also can follow back up with your primary care doctor.  Take the tramadol as needed for pain.  Sent to your pharmacy.  Return for any new or worse symptoms.  X-ray showed a left pelvic fracture of the ischium.  This is a stable fracture.  You can walk on it.

## 2019-09-02 NOTE — H&P (Signed)
History and Physical    Sydney [name] [id number] DOB: July 26, 1932 DOA: 09/02/2019  PCP: Venia Carbon, MD   Patient coming from: Home  I have personally briefly reviewed patient's old medical records in [place]  Chief Complaint: I fell  HPI: Sydney [name] is a 86 y.o. female with medical history significant of HTN, lumbar spine stenosis s/p lumbar spinal fusion, bilateral hip OA status post bilateral total hip replacement, presented with left-sided pelvic pain after a fall.  Last night, patient was in the bathroom trying to reach her urinal on the floor, then tripped and fell down landed on the left sided buttock.  Immediately feeling extremely pain on the left hip.  She was able to get up and walk last night. But this morning pain became extreme.  She denied any other joint or muscle pain, no loss of consciousness no chest pains or short of breath.  ED Course: X-ray showed: fracture left ischium with mild impaction at the fracture site. Total hip replacements bilaterally with prosthetic components well-seated. No dislocation. Osteoporosis.  ED gave patient pain meds and walk the patient around.  Initially patient was doing okay, but when ED try to discharge patient and put the patient in her car, patient complained pain was unbearable and she could not even sit in the car.  Review of Systems: As per HPI otherwise 10 point review of systems negative.    Past Medical History:  Diagnosis Date  . GERD (gastroesophageal reflux disease)   . Hx of lumbosacral spine surgery t  . Hyperlipidemia   . Hypertension   . Iliac artery aneurysm ([place])   . Lumbar spinal stenosis   . Polymyalgia rheumatica ([place]) 06/2016    Past Surgical History:  Procedure Laterality Date  . JOINT REPLACEMENT  1992   right hip replacement  . JOINT REPLACEMENT  2002   left hip replacement  . ROTATOR CUFF REPAIR  2002   Left shoulder  . SPINE SURGERY  2006 & 2010   posterior spinal fusion L4-5  L5S1     reports that she has never smoked. She has never used smokeless tobacco. She reports that she does not drink alcohol or use drugs.  Allergies  Allergen Reactions  . Lipitor [Atorvastatin]     Dizzy ( fuzziness)  . Latex Rash    Family History  Problem Relation Age of Onset  . Heart disease Mother   . Heart disease Father        Heart Disease before age 7  . Heart attack Father   . Hyperlipidemia Father   . Heart disease Sister   . Diabetes Sister   . Heart disease Brother   . Hyperlipidemia Brother   . Heart disease Brother   . Cancer Brother      Prior to Admission medications   Medication Sig Start Date End Date Taking? Authorizing Provider  acetaminophen (TYLENOL) 500 MG tablet Take 500 mg by mouth as needed for mild pain.    Yes [provider]  furosemide (LASIX) 20 MG tablet TAKE 2 TABLETS BY MOUTH EVERY DAY Patient taking differently: Take 20 mg by mouth daily.  09/20/18  Yes Venia Carbon, MD  nadolol (CORGARD) 40 MG tablet TAKE 1 TABLET BY MOUTH EVERY DAY Patient taking differently: Take 40 mg by mouth daily.  10/25/18  Yes Venia Carbon, MD  pantoprazole (PROTONIX) 40 MG tablet Take 1 tablet (40 mg total) by mouth daily. Patient not taking: Reported on  09/02/2019 03/23/19   Viviana Simpler I, MD  tiZANidine (ZANAFLEX) 2 MG tablet TAKE 1 TABLET BY MOUTH 3 TIMES A DAY AS NEEDED FOR MUSCLE SPASMS Patient not taking: No sig reported 05/26/19   Venia Carbon, MD  traMADol (ULTRAM) 50 MG tablet Take 1 tablet (50 mg total) by mouth every 6 (six) hours as needed. 09/02/19   Fredia Sorrow, MD    Physical Exam: Vitals:   09/02/19 0657 09/02/19 0658 09/02/19 0745 09/02/19 0922  BP: 126/72  (!) 148/70 (!) 121/92  Pulse: 62  61 63  Resp: 18  16 16   Temp: 98.5 F (36.9 C)   98.2 F (36.8 C)  TempSrc: Oral   Oral  SpO2: 96%  97% 97%  Weight:  83 kg    Height:  5\' 6"  (1.676 m)      Constitutional: NAD, calm, comfortable Vitals:    09/02/19 0657 09/02/19 0658 09/02/19 0745 09/02/19 0922  BP: 126/72  (!) 148/70 (!) 121/92  Pulse: 62  61 63  Resp: 18  16 16   Temp: 98.5 F (36.9 C)   98.2 F (36.8 C)  TempSrc: Oral   Oral  SpO2: 96%  97% 97%  Weight:  83 kg    Height:  5\' 6"  (1.676 m)     Eyes: PERRL, lids and conjunctivae normal ENMT: Mucous membranes are moist. Posterior pharynx clear of any exudate or lesions.Normal dentition.  Neck: normal, supple, no masses, no thyromegaly Respiratory: clear to auscultation bilaterally, no wheezing, no crackles. Normal respiratory effort. No accessory muscle use.  Cardiovascular: Regular rate and rhythm, no murmurs / rubs / gallops. No extremity edema. 2+ pedal pulses. No carotid bruits.  Abdomen: no tenderness, no masses palpated. No hepatosplenomegaly. Bowel sounds positive.  Musculoskeletal: no clubbing / cyanosis. No joint deformity upper and lower extremities. Good ROM, no contractures. Normal muscle tone. Pelvic crest tenderness Skin: no rashes, lesions, ulcers. No induration Neurologic: CN 2-12 grossly intact. Sensation intact, DTR normal. Strength 5/5 in all 4.  Psychiatric: Normal judgment and insight. Alert and oriented x 3. Normal mood.     Labs on Admission: I have personally reviewed following labs and imaging studies  CBC: No results for input(s): WBC, NEUTROABS, HGB, HCT, MCV, PLT in the last 168 hours. Basic Metabolic Panel: No results for input(s): NA, K, CL, CO2, GLUCOSE, BUN, CREATININE, CALCIUM, MG, PHOS in the last 168 hours. GFR: CrCl cannot be calculated (Patient's most recent lab result is older than the maximum 21 days allowed.). Liver Function Tests: No results for input(s): AST, ALT, ALKPHOS, BILITOT, PROT, ALBUMIN in the last 168 hours. No results for input(s): LIPASE, AMYLASE in the last 168 hours. No results for input(s): AMMONIA in the last 168 hours. Coagulation Profile: No results for input(s): INR, PROTIME in the last 168 hours. Cardiac  Enzymes: No results for input(s): CKTOTAL, CKMB, CKMBINDEX, TROPONINI in the last 168 hours. BNP (last 3 results) No results for input(s): PROBNP in the last 8760 hours. HbA1C: No results for input(s): HGBA1C in the last 72 hours. CBG: No results for input(s): GLUCAP in the last 168 hours. Lipid Profile: No results for input(s): CHOL, HDL, LDLCALC, TRIG, CHOLHDL, LDLDIRECT in the last 72 hours. Thyroid Function Tests: No results for input(s): TSH, T4TOTAL, FREET4, T3FREE, THYROIDAB in the last 72 hours. Anemia Panel: No results for input(s): VITAMINB12, FOLATE, FERRITIN, TIBC, IRON, RETICCTPCT in the last 72 hours. Urine analysis:    Component Value Date/Time   BILIRUBINUR negative 02/26/2016 [id number]  PROTEINUR negative 02/26/2016 0937   UROBILINOGEN negative 02/26/2016 0937   NITRITE negative 02/26/2016 0937   LEUKOCYTESUR Trace (A) 02/26/2016 0937    Radiological Exams on Admission: DG Lumbar Spine Complete  Result Date: 09/02/2019 CLINICAL DATA:  Fall. Pain. EXAM: LUMBAR SPINE - COMPLETE 4+ VIEW COMPARISON:  11/26/2017 FINDINGS: Similar to previous exam, lowest lumbar type vertebral body labeled L5. Stable appearance of posterior fusion from L1-L4 with interbody fusion device at L1-2. Hardware appears intact. Stable 13 millimeters anterolisthesis of L5 on S1. Significant facet hypertrophy and facet fusion. Significant osteophytes in the LOWER thoracic spine. No acute fracture. Status post bilateral hip arthroplasty. IMPRESSION: 1. Stable appearance of posterior fusion from L1-L4. 2. Stable anterolisthesis of L5 on S1. Electronically Signed   By: Nolon Nations M.D.   On: 09/02/2019 08:39   DG Hip Unilat With Pelvis 2-3 Views Left  Result Date: 09/02/2019 CLINICAL DATA:  Pain following fall EXAM: DG HIP (WITH OR WITHOUT PELVIS) 2-3V LEFT COMPARISON:  December 27, 2015 FINDINGS: Frontal pelvis as well as frontal and lateral left hip images were obtained. There are total hip  replacements bilaterally with prosthetic components appearing well-seated. There is a fracture of the mid ischium on the left with slight impaction at the fracture site. No other fracture is appreciable. No dislocation. Bones are osteoporotic. There is postoperative change in the visualized lower lumbar spine. IMPRESSION: Fracture left ischium with mild impaction at the fracture site. Total hip replacements bilaterally with prosthetic components well-seated. No dislocation. Osteoporosis. Electronically Signed   By: Lowella Grip III M.D.   On: 09/02/2019 07:43    EKG: Ordered  Assessment/Plan Active Problems:   Pelvic fracture (HCC)   Osteoporosis  Pelvic fracture -Fracture itself looks stable, but patient has severe osteoporosis, will place patient under observation for pain control and PT evaluation  Osteoporosis -Check vitamin D level -Start vitamin D and calcium supplement -Outpatient PCP follow-up for bone density study  Hypertension -Controlled, continue home regimen   DVT prophylaxis: Lovenox Code Status: Full code Family Communication: None at bedside Disposition Plan: Depends on PT evaluation, home PT versus rehab Consults called: None Admission status: MedSurg observation   Lequita Halt MD [facility] Pager [phone]    09/02/2019, 1:32 PM

## 2019-09-02 NOTE — ED Notes (Signed)
Patient verbalizes understanding of discharge instructions. Opportunity for questioning and answers were provided. Pt discharged from ED. 

## 2019-09-02 NOTE — Plan of Care (Signed)
  Problem: Clinical Measurements: Goal: Ability to maintain clinical measurements within normal limits will improve Outcome: Progressing   Problem: Pain Managment: Goal: General experience of comfort will improve Outcome: Progressing   Problem: Safety: Goal: Ability to remain free from injury will improve Outcome: Progressing   

## 2019-09-03 DIAGNOSIS — M810 Age-related osteoporosis without current pathological fracture: Secondary | ICD-10-CM

## 2019-09-03 DIAGNOSIS — S32602A Unspecified fracture of left ischium, initial encounter for closed fracture: Secondary | ICD-10-CM | POA: Diagnosis not present

## 2019-09-03 DIAGNOSIS — W19XXXA Unspecified fall, initial encounter: Secondary | ICD-10-CM | POA: Diagnosis not present

## 2019-09-03 MED ORDER — PHENOL 1.4 % MT LIQD
1.0000 | OROMUCOSAL | Status: DC | PRN
  Filled 2019-09-03: qty 177

## 2019-09-03 MED ORDER — ASPIRIN EC 81 MG PO TBEC
81.0000 mg | DELAYED_RELEASE_TABLET | Freq: Every day | ORAL | Status: DC
  Administered 2019-09-03 – 2019-09-06 (×4): 81 mg via ORAL
  Filled 2019-09-03 (×4): qty 1

## 2019-09-03 NOTE — Progress Notes (Signed)
PROGRESS NOTE    Sydney [name]  [id number] DOB: 04-27-1932 DOA: 09/02/2019 PCP: Venia Carbon, MD    Brief Narrative:  86 y.o. female with medical history significant of HTN, lumbar spine stenosis s/p lumbar spinal fusion, bilateral hip OA status post bilateral total hip replacement, presented with left-sided pelvic pain after a fall.  Last night, patient was in the bathroom trying to reach her urinal on the floor, then tripped and fell down landed on the left sided buttock.  Immediately feeling extremely pain on the left hip.  She was able to get up and walk last night. But this morning pain became extreme.  She denied any other joint or muscle pain, no loss of consciousness no chest pains or short of breath.  ED Course: X-ray showed: fracture left ischium with mild impaction at the fracture site. Total hip replacements bilaterally with prosthetic components well-seated. No dislocation. Osteoporosis.  ED gave patient pain meds and walk the patient around.  Initially patient was doing okay, but when ED try to discharge patient and put the patient in her car, patient complained pain was unbearable and she could not even sit in the car.  Assessment & Plan:   Active Problems:   Pelvic fracture (HCC)   Osteoporosis   Pelvic fracture -Fracture appears stable -Imaging reviewed. Prior surgical hip hardware in place and intact -PT consulted -Continue with analgesia as needed  Osteoporosis -Vitamin D level low at 11.47 -Pt continued on vitamin D and calcium supplement -Outpatient PCP follow-up for bone density study  Hypertension -Currently stable and controlled -continue home regimen as tolerated  DVT prophylaxis: Lovenox subq Code Status: Full Family Communication: Pt in room, family not at bedside  Status is: Observation  The patient remains OBS appropriate and will d/c before 2 midnights.  Dispo: The patient is from: Home              Anticipated d/c is to: Home              Anticipated d/c date is: 1 day, awaiting PT eval              Patient currently is not medically stable to d/c.       Consultants:     Procedures:     Antimicrobials: Anti-infectives (From admission, onward)   None       Subjective: Complaining of hip pain  Objective: Vitals:   09/02/19 0745 09/02/19 0922 09/02/19 1651 09/03/19 1426  BP: (!) 148/70 (!) 121/92 138/67 (!) 100/54  Pulse: 61 63 64 67  Resp: 16 16 14 18   Temp:  98.2 F (36.8 C) 98.5 F (36.9 C) 97.8 F (36.6 C)  TempSrc:  Oral Oral Oral  SpO2: 97% 97% 99% 98%  Weight:      Height:        Intake/Output Summary (Last 24 hours) at 09/03/2019 1550 Last data filed at 09/03/2019 1342 Gross per 24 hour  Intake 600 ml  Output 600 ml  Net 0 ml   Filed Weights   09/02/19 0658  Weight: 83 kg    Examination:  General exam: Appears calm and comfortable  Respiratory system: Clear to auscultation. Respiratory effort normal. Cardiovascular system: S1 & S2 heard, Regular Gastrointestinal system: Abdomen is nondistended, soft and nontender. No organomegaly or masses felt. Normal bowel sounds heard. Central nervous system: Alert and oriented. No focal neurological deficits. Extremities: Symmetric 5 x 5 power. Skin: No rashes, lesions  Psychiatry: Judgement and insight appear  normal. Mood & affect appropriate.   Data Reviewed: I have personally reviewed following labs and imaging studies  CBC: Recent Labs  Lab 09/02/19 1305  WBC 8.4  NEUTROABS 6.3  HGB 15.4*  HCT 47.9*  MCV 96.6  PLT [id number]   Basic Metabolic Panel: Recent Labs  Lab 09/02/19 1305  NA 141  K 3.7  CL 104  CO2 25  GLUCOSE 133*  BUN 17  CREATININE 1.01*  CALCIUM 9.8   GFR: Estimated Creatinine Clearance: 44.2 mL/min (A) (by C-G formula based on SCr of 1.01 mg/dL (H)). Liver Function Tests: No results for input(s): AST, ALT, ALKPHOS, BILITOT, PROT, ALBUMIN in the last 168 hours. No results for input(s): LIPASE,  AMYLASE in the last 168 hours. No results for input(s): AMMONIA in the last 168 hours. Coagulation Profile: No results for input(s): INR, PROTIME in the last 168 hours. Cardiac Enzymes: No results for input(s): CKTOTAL, CKMB, CKMBINDEX, TROPONINI in the last 168 hours. BNP (last 3 results) No results for input(s): PROBNP in the last 8760 hours. HbA1C: No results for input(s): HGBA1C in the last 72 hours. CBG: No results for input(s): GLUCAP in the last 168 hours. Lipid Profile: No results for input(s): CHOL, HDL, LDLCALC, TRIG, CHOLHDL, LDLDIRECT in the last 72 hours. Thyroid Function Tests: No results for input(s): TSH, T4TOTAL, FREET4, T3FREE, THYROIDAB in the last 72 hours. Anemia Panel: No results for input(s): VITAMINB12, FOLATE, FERRITIN, TIBC, IRON, RETICCTPCT in the last 72 hours. Sepsis Labs: No results for input(s): PROCALCITON, LATICACIDVEN in the last 168 hours.  Recent Results (from the past 240 hour(s))  SARS Coronavirus 2 by RT PCR (hospital order, performed in [facility] hospital lab) Nasopharyngeal Nasopharyngeal Swab     Status: None   Collection Time: 09/02/19  1:54 PM   Specimen: Nasopharyngeal Swab  Result Value Ref Range Status   SARS Coronavirus 2 NEGATIVE NEGATIVE Final    Comment: (NOTE) SARS-CoV-2 target nucleic acids are NOT DETECTED. The SARS-CoV-2 RNA is generally detectable in upper and lower respiratory specimens during the acute phase of infection. The lowest concentration of SARS-CoV-2 viral copies this assay can detect is 250 copies / mL. A negative result does not preclude SARS-CoV-2 infection and should not be used as the sole basis for treatment or other patient management decisions.  A negative result may occur with improper specimen collection / handling, submission of specimen other than nasopharyngeal swab, presence of viral mutation(s) within the areas targeted by this assay, and inadequate number of viral copies (<250 copies / mL). A  negative result must be combined with clinical observations, patient history, and epidemiological information. Fact Sheet for Patients:   StrictlyIdeas.no Fact Sheet for Healthcare Providers: BankingDealers.co.za This test is not yet approved or cleared  by the [place] FDA and has been authorized for detection and/or diagnosis of SARS-CoV-2 by FDA under an Emergency Use Authorization (EUA).  This EUA will remain in effect (meaning this test can be used) for the duration of the COVID-19 declaration under Section 564(b)(1) of the Act, 21 U.S.C. section 360bbb-3(b)(1), unless the authorization is terminated or revoked sooner. Performed at [facility], [place] [address], [place], [place] [address]      Radiology Studies: DG Lumbar Spine Complete  Result Date: 09/02/2019 CLINICAL DATA:  Fall. Pain. EXAM: LUMBAR SPINE - COMPLETE 4+ VIEW COMPARISON:  11/26/2017 FINDINGS: Similar to previous exam, lowest lumbar type vertebral body labeled L5. Stable appearance of posterior fusion from L1-L4 with interbody fusion device at  L1-2. Hardware appears intact. Stable 13 millimeters anterolisthesis of L5 on S1. Significant facet hypertrophy and facet fusion. Significant osteophytes in the LOWER thoracic spine. No acute fracture. Status post bilateral hip arthroplasty. IMPRESSION: 1. Stable appearance of posterior fusion from L1-L4. 2. Stable anterolisthesis of L5 on S1. Electronically Signed   By: Nolon Nations M.D.   On: 09/02/2019 08:39   DG Hip Unilat With Pelvis 2-3 Views Left  Result Date: 09/02/2019 CLINICAL DATA:  Pain following fall EXAM: DG HIP (WITH OR WITHOUT PELVIS) 2-3V LEFT COMPARISON:  December 27, 2015 FINDINGS: Frontal pelvis as well as frontal and lateral left hip images were obtained. There are total hip replacements bilaterally with prosthetic components appearing well-seated. There is a fracture of the mid ischium on the left  with slight impaction at the fracture site. No other fracture is appreciable. No dislocation. Bones are osteoporotic. There is postoperative change in the visualized lower lumbar spine. IMPRESSION: Fracture left ischium with mild impaction at the fracture site. Total hip replacements bilaterally with prosthetic components well-seated. No dislocation. Osteoporosis. Electronically Signed   By: Lowella Grip III M.D.   On: 09/02/2019 07:43    Scheduled Meds: . aspirin EC  81 mg Oral Daily  . calcium carbonate  1 tablet Oral BID WC  . enoxaparin (LOVENOX) injection  40 mg Subcutaneous Q24H  . furosemide  20 mg Oral Daily  . nadolol  40 mg Oral Daily  . Vitamin D (Ergocalciferol)  50,000 Units Oral Q7 days   Continuous Infusions:   LOS: 0 days   Marylu Lund, MD [facility] Pager On Amion  If 7PM-7AM, please contact night-coverage 09/03/2019, 3:50 PM

## 2019-09-03 NOTE — Plan of Care (Signed)
  Problem: Clinical Measurements: Goal: Ability to maintain clinical measurements within normal limits will improve Outcome: Progressing   Problem: Activity: Goal: Risk for activity intolerance will decrease Outcome: Progressing   Problem: Pain Managment: Goal: General experience of comfort will improve Outcome: Progressing   

## 2019-09-03 NOTE — Social Work (Signed)
CSW received message from physical therapy concerns for pt and husbands safety. Physical therapy stated that pt is the sole caretaker of husband who is non ambulatory. Physical therapy stated that pt wants her and husband to stay together and they have ties to [facility]. CSW explained that csw can help facilitate pt going to ALF but husband will have to be facilitated by PCP.   CSW will continue to follow.   Emeterio Reeve, Latanya Presser, [place] Social Worker [phone]

## 2019-09-03 NOTE — Plan of Care (Signed)

## 2019-09-03 NOTE — Evaluation (Addendum)
Physical Therapy Evaluation Patient Details Name: Sydney [name] MRN: [id number] DOB: 30-May-1932 Today's Date: 09/03/2019   History of Present Illness  86 y.o. female with medical history significant of HTN, lumbar spine stenosis s/p lumbar spinal fusion, bilateral hip OA status post bilateral total hip replacement, presented with left-sided pelvic pain after a fall. Xray showed fracture left ischium with mild impaction at the fracture site. Total hip replacements bilaterally with prosthetic components well seated; no dislocation. Osteoporosis noted.   Clinical Impression  Prior to admission, pt lives with her husband and is independent with ambulation using cane vs walker and ADL's/IADL's. On PT evaluation, pt is very pleasant and motivated to participate in therapy. Pt presents with significantly decreased functional mobility secondary to LLE proximal weakness, pain, and gait abnormalities. Requiring moderate assist for bed mobility and transfers, ambulating 10 feet with a walker at a min assist level with close chair follow. Demonstrates shuffling gait pattern with decreased bilateral foot clearance.   Pt with difficult situation currently as she is the primary caregiver for her husband who is nonambulatory and bed bound. He has caregivers who assist with ADL's one hour/day. Otherwise, she states she has no other support available. She endorses she has a son and a grandchild, but they are both "disabled," and unable to provide any assist. She states she understands my recommendation for rehab as I cannot recommend she go home unless she is at a modI level (which I do believe she could achieve with more therapy). She asked me about the possibility of going to [facility] with her husband as they have connections there. CSW notified.     Follow Up Recommendations CIR;SNF;Supervision for mobility/OOB (post acute rehab recommended; d/c plan tentative given difficult home situation)    Equipment  Recommendations  Wheelchair (measurements PT);Wheelchair cushion (measurements PT)    Recommendations for Other Services       Precautions / Restrictions Precautions Precautions: Fall Restrictions Weight Bearing Restrictions: No      Mobility  Bed Mobility Overal bed mobility: Needs Assistance Bed Mobility: Supine to Sit     Supine to sit: Mod assist     General bed mobility comments: ModA for trunk assist to upright  Transfers Overall transfer level: Needs assistance Equipment used: Rolling walker (2 wheeled) Transfers: Sit to/from Stand Sit to Stand: Mod assist         General transfer comment: After multiple attempts, pt able to achieve standing with modA to boost up from elevated bed height. Cues for hand placement  Ambulation/Gait Ambulation/Gait assistance: Min assist Gait Distance (Feet): 10 Feet Assistive device: Rolling walker (2 wheeled) Gait Pattern/deviations: Step-through pattern;Decreased stride length;Decreased dorsiflexion - right;Decreased dorsiflexion - left;Trunk flexed Gait velocity: decreased   General Gait Details: MinA for stability, chair follow utilized. Decreased bilateral foot clearance. Slow, painful, shuffling gait pattern. Cues for sequencing   Stairs            Wheelchair Mobility    Modified Rankin (Stroke Patients Only)       Balance Overall balance assessment: Needs assistance Sitting-balance support: Feet supported Sitting balance-Leahy Scale: Good     Standing balance support: Bilateral upper extremity supported Standing balance-Leahy Scale: Poor                               Pertinent Vitals/Pain Pain Assessment: Faces Faces Pain Scale: Hurts whole lot Pain Location: groin with movement Pain Descriptors / Indicators: Grimacing;Guarding;Sharp Pain  Intervention(s): Limited activity within patient's tolerance;Monitored during session;Repositioned    Home Living Family/patient expects to be  discharged to:: Private residence Living Arrangements: Spouse/significant other   Type of Home: House Home Access: Stairs to enter Entrance Stairs-Rails: Right Entrance Stairs-Number of Steps: 3 Home Layout: Able to live on main level with bedroom/bathroom Home Equipment: Cane - single point;Wheelchair - Education administrator (comment);Shower seat;Bedside commode;Walker - 2 wheels(3 wheeled walker) Additional Comments: Pt is primary caregiver for husband    Prior Function Level of Independence: Independent with assistive device(s)         Comments: Uses 3 wheeled walker for household ambulation, cane for community distances     Hand Dominance        Extremity/Trunk Assessment   Upper Extremity Assessment Upper Extremity Assessment: Overall WFL for tasks assessed    Lower Extremity Assessment Lower Extremity Assessment: RLE deficits/detail;LLE deficits/detail RLE Deficits / Details: Strength 5/5 LLE Deficits / Details: Strength 5/5 except hip flexion 2/5       Communication   Communication: No difficulties  Cognition Arousal/Alertness: Awake/alert Behavior During Therapy: WFL for tasks assessed/performed Overall Cognitive Status: Within Functional Limits for tasks assessed                                        General Comments      Exercises General Exercises - Lower Extremity Ankle Circles/Pumps: 5 reps;Both;Seated Quad Sets: Both;5 reps;Seated   Assessment/Plan    PT Assessment Patient needs continued PT services  PT Problem List Decreased strength;Decreased activity tolerance;Decreased balance;Decreased mobility;Pain       PT Treatment Interventions DME instruction;Functional mobility training;Gait training;Stair training;Therapeutic activities;Therapeutic exercise;Balance training;Patient/family education;Wheelchair mobility training    PT Goals (Current goals can be found in the Care Plan section)  Acute Rehab PT Goals Patient Stated Goal:  get stronger, return home, take care of husband PT Goal Formulation: With patient Time For Goal Achievement: 09/17/19 Potential to Achieve Goals: Good    Frequency Min 5X/week   Barriers to discharge Decreased caregiver support      Co-evaluation               AM-PAC PT "6 Clicks" Mobility  Outcome Measure Help needed turning from your back to your side while in a flat bed without using bedrails?: A Little Help needed moving from lying on your back to sitting on the side of a flat bed without using bedrails?: A Lot Help needed moving to and from a bed to a chair (including a wheelchair)?: A Little Help needed standing up from a chair using your arms (e.g., wheelchair or bedside chair)?: A Lot Help needed to walk in hospital room?: A Little Help needed climbing 3-5 steps with a railing? : Total 6 Click Score: 14    End of Session Equipment Utilized During Treatment: Gait belt Activity Tolerance: Patient limited by pain Patient left: in chair;with call bell/phone within reach;with chair alarm set Nurse Communication: Mobility status;Other (comment)(d/c plan) PT Visit Diagnosis: Unsteadiness on feet (R26.81);Other abnormalities of gait and mobility (R26.89);Difficulty in walking, not elsewhere classified (R26.2);Pain Pain - Right/Left: Left Pain - part of body: Hip    Time: [id number] PT Time Calculation (min) (ACUTE ONLY): 32 min   Charges:   PT Evaluation $PT Eval Moderate Complexity: 1 Mod PT Treatments $Gait Training: 8-22 mins          Wyona Almas, PT, DPT Acute Rehabilitation  Services Pager [phone] Office [phone]   Sydney [name] 09/03/2019, 4:47 PM

## 2019-09-03 NOTE — Care Management Obs Status (Signed)
[place] NOTIFICATION   Patient Details  Name: Sydney [name] MRN: [id number] Date of Birth: 1933/02/28   Medicare Observation Status Notification Given:  Yes    Verdell Carmine, RN 09/03/2019, 4:02 PM

## 2019-09-04 DIAGNOSIS — S32602A Unspecified fracture of left ischium, initial encounter for closed fracture: Secondary | ICD-10-CM | POA: Diagnosis not present

## 2019-09-04 DIAGNOSIS — W19XXXA Unspecified fall, initial encounter: Secondary | ICD-10-CM | POA: Diagnosis not present

## 2019-09-04 DIAGNOSIS — M810 Age-related osteoporosis without current pathological fracture: Secondary | ICD-10-CM | POA: Diagnosis not present

## 2019-09-04 MED ORDER — POLYETHYLENE GLYCOL 3350 17 G PO PACK
17.0000 g | PACK | Freq: Every day | ORAL | Status: DC
  Administered 2019-09-04 – 2019-09-06 (×3): 17 g via ORAL
  Filled 2019-09-04 (×4): qty 1

## 2019-09-04 MED ORDER — BISACODYL 5 MG PO TBEC
5.0000 mg | DELAYED_RELEASE_TABLET | Freq: Every day | ORAL | Status: DC | PRN
  Administered 2019-09-04: 5 mg via ORAL
  Filled 2019-09-04: qty 1

## 2019-09-04 NOTE — Plan of Care (Signed)
  Problem: Education: Goal: Knowledge of General Education information will improve Description: Including pain rating scale, medication(s)/side effects and non-pharmacologic comfort measures Outcome: Progressing   Problem: Health Behavior/Discharge Planning: Goal: Ability to manage health-related needs will improve Outcome: Progressing   Problem: Clinical Measurements: Goal: Ability to maintain clinical measurements within normal limits will improve Outcome: Progressing  Goal: Respiratory complications will improve Outcome: Progressing  Goal: Cardiovascular complication will be avoided Outcome: Progressing   Problem: Nutrition: Goal: Adequate nutrition will be maintained Outcome: Progressing   Problem: Coping: Goal: Level of anxiety will decrease Outcome: Progressing   Problem: Elimination: Goal: Will not experience complications related to urinary retention Outcome: Progressing   Problem: Pain Managment: Goal: General experience of comfort will improve Outcome: Progressing   Problem: Safety: Goal: Ability to remain free from injury will improve Outcome: Progressing   Problem: Skin Integrity: Goal: Risk for impaired skin integrity will decrease Outcome: Progressing   

## 2019-09-04 NOTE — Progress Notes (Addendum)
Physical Therapy Treatment Patient Details Name: Sydney [name] MRN: [id number] DOB: 01-05-1933 Today's Date: 09/04/2019    History of Present Illness 86 y.o. female with medical history significant of HTN, lumbar spine stenosis s/p lumbar spinal fusion, bilateral hip OA status post bilateral total hip replacement, presented with left-sided pelvic pain after a fall. Xray showed fracture left ischium with mild impaction at the fracture site. Total hip replacements bilaterally with prosthetic components well seated; no dislocation. Osteoporosis noted.     PT Comments    Pt making steady progress towards her physical therapy goals, requiring less assist for bed mobility and increased ambulation distance. Continues to have difficulty with transitions to standing, requiring increased assist. Ambulating 30 feet with a walker and chair follow. Limited by back/groin pain, weakness, and decreased activity tolerance. D/c recommendation remains appropriate as pt has to progress to modI level to return home.   Pt continues to be hopeful for discharge to [facility] with husband who is nonambulatory and bed bound. CSW aware and working on it.     Follow Up Recommendations  SNF;Supervision for mobility/OOB     Equipment Recommendations  Wheelchair (measurements PT);Wheelchair cushion (measurements PT)    Recommendations for Other Services       Precautions / Restrictions Precautions Precautions: Fall Restrictions Weight Bearing Restrictions: No    Mobility  Bed Mobility Overal bed mobility: Needs Assistance Bed Mobility: Supine to Sit     Supine to sit: Min assist     General bed mobility comments: VCs for sequencing, HOB up and use of rail. MinA at trunk for elevation to upright  Transfers Overall transfer level: Needs assistance Equipment used: Rolling walker (2 wheeled) Transfers: Sit to/from Stand Sit to Stand: Mod assist         General transfer comment: From raised bed and  recliner  Ambulation/Gait Ambulation/Gait assistance: Min assist Gait Distance (Feet): 30 Feet Assistive device: Rolling walker (2 wheeled) Gait Pattern/deviations: Step-through pattern;Decreased stride length;Decreased dorsiflexion - right;Decreased dorsiflexion - left;Trunk flexed Gait velocity: decreased Gait velocity interpretation: <1.8 ft/sec, indicate of risk for recurrent falls General Gait Details: MinA for stability, chair follow utilized. Cues for sequencing, walker proximity, upright posture, increased foot clearance. Pt with noted decreased bilateral foot clearance and very guarded posture.   Stairs             Wheelchair Mobility    Modified Rankin (Stroke Patients Only)       Balance Overall balance assessment: Needs assistance Sitting-balance support: No upper extremity supported;Feet supported Sitting balance-Leahy Scale: Good     Standing balance support: No upper extremity supported;During functional activity Standing balance-Leahy Scale: Fair Standing balance comment: standing at sink to brush hair, brush teeth, and wash face                            Cognition Arousal/Alertness: Awake/alert Behavior During Therapy: WFL for tasks assessed/performed Overall Cognitive Status: Within Functional Limits for tasks assessed                                        Exercises General Exercises - Lower Extremity Long Arc Quad: Both;10 reps;Seated Hip ABduction/ADduction: Both;10 reps;Seated Other Exercises Other Exercises: x2 Sit to stands from recliner    General Comments        Pertinent Vitals/Pain Pain Assessment: Faces Pain Score: 5  Faces Pain Scale: Hurts whole lot Pain Location: lower back, groin with movement Pain Descriptors / Indicators: Aching;Grimacing;Guarding;Sore Pain Intervention(s): Limited activity within patient's tolerance;Monitored during session    [place] expects to be  discharged to:: Skilled nursing facility Living Arrangements: Spouse/significant other   Type of Home: House Home Access: Stairs to enter Entrance Stairs-Rails: Right Home Layout: Able to live on main level with bedroom/bathroom Home Equipment: [place] - single point;Wheelchair - Education administrator (comment);Shower seat;Bedside commode;Walker - 2 wheels;Adaptive equipment Additional Comments: Pt is primary caregiver for husband    Prior Function Level of Independence: Independent with assistive device(s)      Comments: Uses 3 wheeled walker for household ambulation, cane for community distances   PT Goals (current goals can now be found in the care plan section) Acute Rehab PT Goals Patient Stated Goal: get stronger, return home, take care of husband Potential to Achieve Goals: Good Progress towards PT goals: Progressing toward goals    Frequency    Min 3X/week      PT Plan Frequency needs to be updated    Co-evaluation PT/OT/SLP Co-Evaluation/Treatment: Yes Reason for Co-Treatment: For patient/therapist safety;To address functional/ADL transfers PT goals addressed during session: Mobility/safety with mobility;Strengthening/ROM OT goals addressed during session: Strengthening/ROM;ADL's and self-care      AM-PAC PT "6 Clicks" Mobility   Outcome Measure  Help needed turning from your back to your side while in a flat bed without using bedrails?: A Little Help needed moving from lying on your back to sitting on the side of a flat bed without using bedrails?: A Little Help needed moving to and from a bed to a chair (including a wheelchair)?: A Little Help needed standing up from a chair using your arms (e.g., wheelchair or bedside chair)?: A Lot Help needed to walk in hospital room?: A Little Help needed climbing 3-5 steps with a railing? : Total 6 Click Score: 15    End of Session Equipment Utilized During Treatment: Gait belt Activity Tolerance: Patient tolerated treatment  well Patient left: in chair;with call bell/phone within reach;with chair alarm set Nurse Communication: Mobility status PT Visit Diagnosis: Unsteadiness on feet (R26.81);Other abnormalities of gait and mobility (R26.89);Difficulty in walking, not elsewhere classified (R26.2);Pain Pain - Right/Left: Left Pain - part of body: Hip     Time: [id number] PT Time Calculation (min) (ACUTE ONLY): 23 min  Charges:  $Gait Training: 8-22 mins                       Wyona Almas, PT, DPT Acute Rehabilitation Services Pager [phone] Office [phone]    Deno Etienne 09/04/2019, 10:57 AM

## 2019-09-04 NOTE — Progress Notes (Signed)
Inpatient Rehab Admissions:  Inpatient Rehab Consult received.  I met with patient at the bedside for rehabilitation assessment and to discuss goals and expectations of an inpatient rehab admission.  Pt appears to be an appropriate candidate for CIR.  However, d/t pt being primary caregiver of husband who is nonambulatory and bedbound, she would prefer to receive therapy at a place where they can both be admitted.  Pt reported that she does not have family/friends/caregviers that can provide adequate care for husband if she were to come to CIR. TOC made aware.  AC will sign off on this pt.  Signed: Gayland Curry, [place], [place] Admissions Coordinator [phone]

## 2019-09-04 NOTE — TOC Progression Note (Addendum)
Transition of Care [facility]) - Progression Note    Patient Details  Name: Sydney [name] MRN: [id number] Date of Birth: [birth year]  Transition of Care [facility]) CM/SW Contact  Rikki Trosper, [place], [place] Phone Number: 09/04/2019, 2:49 PM  Clinical Narrative:    Return call from [facility], who confirmed that patient's spouse was not on service with [facility].  Spoke with patient at bedside who provided the following contact numbers for Hospice [phone] and [phone]. Voicemail left with the triage nurse requesting a call back.  This Education officer, museum will continue to  follow up with patient regarding her discharge needs.  [place], LCSW Transitions of Care [phone]    Expected Discharge Plan: Skilled Nursing Facility Barriers to Discharge: Family Issues  Expected Discharge Plan and Services Expected Discharge Plan: [place] In-house Referral: Clinical Social Work   Post Acute Care Choice: [place] Living arrangements for the past 2 months: Single Family Home                                       Social Determinants of Health (SDOH) Interventions    Readmission Risk Interventions No flowsheet data found.

## 2019-09-04 NOTE — TOC Initial Note (Addendum)
Transition of Care [facility]) - Initial/Assessment Note    Patient Details  Name: Sydney [name] MRN: [id number] Date of Birth: Dec 21, 1932  Transition of Care [facility]) CM/SW Contact:    Elliot Gurney [place], [place] Phone Number: 09/04/2019, 10:48 AM  Clinical Narrative:                 Patient is a 86 year female admitted with left side pelvic pain following a fall. Per patient, she is the primary caregiver for her spouse who is bed bound.  She verbalized increased concern about his care while she is hospitalized. Per patient,  spouse is followed by Hospice and she has currently arranged for patient to receive 8 hours of private duty care by [place] home care. Patient and patient's spouse are followed by [facility].  Her grandson Cherika Longwith is spending the night with patient's spouse.  Patient is agreeable to SNF, however she is adamant that her spouse come with her. This Education officer, museum discussed that joint placement may or may not be possible based on individual medical needs. Per patient, she would open to [facility] or [place].   Phone call made to [facility] upon patient's request informing them of patient's hospitalization and need to follow up with spouse. Message left requesting a return call.  Placement process explained. SNF work up completed.  Social Work to continue to follow.   Expected Discharge Plan: Skilled Nursing Facility Barriers to Discharge: Family Issues   Patient Goals and CMS Choice Patient states their goals for this hospitalization and ongoing recovery are:: ":I think I am going to need some rehab" CMS Medicare.gov Compare Post Acute Care list provided to:: Patient Choice offered to / list presented to : Patient  Expected Discharge Plan and Services Expected Discharge Plan: [place] In-house Referral: Clinical Social Work   Post Acute Care Choice: [place] Living arrangements for the past 2 months: [place]                                      Prior Living Arrangements/Services Living arrangements for the past 2 months: Single Family Home Lives with:: Spouse Patient language and need for interpreter reviewed:: No Do you feel safe going back to the place where you live?: Yes      Need for Family Participation in Patient Care: Yes (Comment)   Current home services: DME(3 in one walker, regular walker and a cain) Criminal Activity/Legal Involvement Pertinent to Current Situation/Hospitalization: No - Comment as needed  Activities of Daily Living      Permission Sought/Granted            Permission granted to share info w Relationship: Juliene Pina  Permission granted to share info w Contact Information: [phone]  Emotional Assessment Appearance:: Appears stated age Attitude/Demeanor/Rapport: Combative, Engaged Affect (typically observed): Anxious, Accepting, Adaptable Orientation: : Oriented to Self, Oriented to Place, Oriented to  Time, Oriented to Situation Alcohol / Substance Use: Not Applicable Psych Involvement: No (comment)  Admission diagnosis:  Pelvic fracture ([place]) [S32.9XXA] Fall, initial encounter [id number]] Closed nondisplaced fracture of left ischium, unspecified fracture morphology, initial encounter [facility]) [S32.602A] Patient Active Problem List   Diagnosis Date Noted  . Pelvic fracture ([place]) 09/02/2019  . Osteoporosis 09/02/2019  . Fall   . Lumbar sprain 03/04/2019  . Polymyalgia rheumatica ([place]) 06/12/2016  . Hyperlipemia 07/23/2015  . Generalized osteoarthritis of  multiple sites 07/14/2014  . Advanced directives, counseling/discussion 12/02/2013  . Routine general medical examination at a health care facility 11/30/2012  . GERD (gastroesophageal reflux disease)   . Iliac artery aneurysm, right ([place]) 01/18/2011  . Obesity, unspecified 01/02/2009  . DEGENERATIVE DISC DISEASE, LUMBAR SPINE 01/02/2009  . Essential hypertension, benign  01/14/2007   PCP:  Venia Carbon, MD Pharmacy:   [facility] - [place], [place] [address] Phone: [phone] Fax: [phone]  [id number] [place], [place] - [address] [place] [address] Phone: [phone] Fax: [phone]     Social Determinants of Health (SDOH) Interventions    Readmission Risk Interventions No flowsheet data found.

## 2019-09-04 NOTE — [place] FL2 (Signed)
[place] MEDICAID FL2 LEVEL OF CARE SCREENING TOOL     IDENTIFICATION  Patient Name: Sydney [name] Birthdate: [birth year] Sex: female Admission Date (Current Location): 09/02/2019  [facility] [place] Number:  Herbalist and Address:  [facility], [place] [address], [place], [place] [address]      Provider Number: [id number]  Attending Physician Name and Address:  Donne Hazel, MD  Relative Name and Phone Number:  Shashana Rear [phone]    Current Level of Care: SNF Recommended Level of Care: [place] Prior Approval Number:    Date Approved/Denied:   PASRR Number: [id number]  Discharge Plan: SNF    Current Diagnoses: Patient Active Problem List   Diagnosis Date Noted  . Pelvic fracture ([place]) 09/02/2019  . Osteoporosis 09/02/2019  . Fall   . Lumbar sprain 03/04/2019  . Polymyalgia rheumatica ([place]) 06/12/2016  . Hyperlipemia 07/23/2015  . Generalized osteoarthritis of multiple sites 07/14/2014  . Advanced directives, counseling/discussion 12/02/2013  . Routine general medical examination at a health care facility 11/30/2012  . GERD (gastroesophageal reflux disease)   . Iliac artery aneurysm, right ([place]) 01/18/2011  . Obesity, unspecified 01/02/2009  . DEGENERATIVE DISC DISEASE, LUMBAR SPINE 01/02/2009  . Essential hypertension, benign 01/14/2007    Orientation RESPIRATION BLADDER Height & Weight     Self, Time, Situation, Place  Normal Continent Weight: 182 lb 15.7 oz (83 kg) Height:  5\' 6"  (167.6 cm)  BEHAVIORAL SYMPTOMS/MOOD NEUROLOGICAL BOWEL NUTRITION STATUS      Continent Diet  AMBULATORY STATUS COMMUNICATION OF NEEDS Skin   Extensive Assist Verbally Normal                       Personal Care Assistance Level of Assistance  Bathing, Dressing, Feeding Bathing Assistance: Limited assistance Feeding assistance: Independent Dressing Assistance: Limited assistance     Functional  Limitations Info  Sight, Hearing, Speech Sight Info: Adequate Hearing Info: Adequate Speech Info: Adequate    SPECIAL CARE FACTORS FREQUENCY  PT (By licensed PT), OT (By licensed OT)     PT Frequency: 5x per week OT Frequency: 5x per week            Contractures Contractures Info: Not present    Additional Factors Info  Code Status Code Status Info: full             Current Medications (09/04/2019):  This is the current hospital active medication list Current Facility-Administered Medications  Medication Dose Route Frequency Provider Last Rate Last Admin  . aspirin EC tablet 81 mg  81 mg Oral Daily Donne Hazel, MD   81 mg at 09/04/19 1000  . bisacodyl (DULCOLAX) EC tablet 5 mg  5 mg Oral Daily PRN Donne Hazel, MD   5 mg at 09/04/19 1000  . calcium carbonate (OS-CAL - dosed in mg of elemental calcium) tablet 500 mg of elemental calcium  1 tablet Oral BID WC Wynetta Fines T, MD   500 mg of elemental calcium at 09/04/19 0754  . enoxaparin (LOVENOX) injection 40 mg  40 mg Subcutaneous Q24H Wynetta Fines T, MD   40 mg at 09/03/19 1328  . furosemide (LASIX) tablet 20 mg  20 mg Oral Daily Wynetta Fines T, MD   20 mg at 09/04/19 1000  . HYDROcodone-acetaminophen (NORCO/VICODIN) 5-325 MG per tablet 1-2 tablet  1-2 tablet Oral Q6H PRN Lequita Halt, MD   1 tablet at 09/04/19 0758  .  HYDROmorphone (DILAUDID) injection 0.5 mg  0.5 mg Intravenous Q2H PRN Wynetta Fines T, MD      . nadolol (CORGARD) tablet 40 mg  40 mg Oral Daily Wynetta Fines T, MD   40 mg at 09/04/19 1129  . phenol (CHLORASEPTIC) mouth spray 1 spray  1 spray Mouth/Throat PRN [place], Scarlette Shorts T, NP      . polyethylene glycol (MIRALAX / GLYCOLAX) packet 17 g  17 g Oral Daily Donne Hazel, MD   17 g at 09/04/19 1129  . senna-docusate (Senokot-S) tablet 1 tablet  1 tablet Oral QHS PRN Lequita Halt, MD   1 tablet at 09/03/19 2216  . Vitamin D (Ergocalciferol) (DRISDOL) capsule 50,000 Units  50,000 Units Oral Q7 days Lequita Halt, MD   50,000 Units at 09/02/19 2126     Discharge Medications: Please see discharge summary for a list of discharge medications.  Relevant Imaging Results:  Relevant Lab Results:   Additional Information Social Security number [id number]  Elliot Gurney [place], [place]

## 2019-09-04 NOTE — Evaluation (Signed)
Occupational Therapy Evaluation Patient Details Name: Sydney [name] MRN: [id number] DOB: 1932-11-20 Today's Date: 09/04/2019    History of Present Illness 86 y.o. female with medical history significant of HTN, lumbar spine stenosis s/p lumbar spinal fusion, bilateral hip OA status post bilateral total hip replacement, presented with left-sided pelvic pain after a fall. Xray showed fracture left ischium with mild impaction at the fracture site. Total hip replacements bilaterally with prosthetic components well seated; no dislocation. Osteoporosis noted.    Clinical Impression   This 86 yo female admitted with above presents to acute OT with PLOF of being totally independent with all basic and IADLs as well as primary caregiver for husband that is bedbound. Currently pt is setup/S or UB ADLs but Mod-Max A for LB ADLs with Mod A for sit<>stand and min A for ambulation (but only going a short distance). She will benefit from acute OT with follow up at SNF.  Called and spoke with CM Claudie Leach) about pt's current home situation where she is the primary caregiver for her husband who is bed bound. Pt reports someone has only been with him ~1 hour a day until today when there will be someone there with him 8 hours a day. Pt is really concerned about her husband since she was not there to check his coumadin on Friday. Pt reports that her husband is under pallative services. Patient knows that she cannot take care of him in her current situation and would really like for both of them to be able to go to a rehab place together (pt prefers [facility] if at all possible).    Follow Up Recommendations  SNF    Equipment Recommendations  None recommended by OT       Precautions / Restrictions Precautions Precautions: Fall Restrictions Weight Bearing Restrictions: No      Mobility Bed Mobility Overal bed mobility: Needs Assistance Bed Mobility: Supine to Sit     Supine to sit: Min assist      General bed mobility comments: VCs for sequencing, HOB up and use of rail  Transfers Overall transfer level: Needs assistance Equipment used: Rolling walker (2 wheeled) Transfers: Sit to/from Stand Sit to Stand: Mod assist         General transfer comment: From raised bed and recliner    Balance Overall balance assessment: Needs assistance Sitting-balance support: No upper extremity supported;Feet supported Sitting balance-Leahy Scale: Good     Standing balance support: No upper extremity supported;During functional activity Standing balance-Leahy Scale: Fair Standing balance comment: standing at sink to brush hair, brush teeth, and wash face                           ADL either performed or assessed with clinical judgement   ADL Overall ADL's : Needs assistance/impaired Eating/Feeding: Independent;Sitting   Grooming: Min guard;Standing;Wash/dry face;Wash/dry hands;Oral care;Brushing hair   Upper Body Bathing: Set up;Sitting   Lower Body Bathing: Moderate assistance Lower Body Bathing Details (indicate cue type and reason): Mod A sit<>stand Upper Body Dressing : Set up;Sitting   Lower Body Dressing: Maximal assistance Lower Body Dressing Details (indicate cue type and reason): Mod A sit<>stand   Toilet Transfer Details (indicate cue type and reason): Mod A sit<>stand, min A to ambulate to /from Toileting- Water quality scientist and Hygiene: Min guard Toileting - Clothing Manipulation Details (indicate cue type and reason): Mod A sit<>stand  Vision Patient Visual Report: No change from baseline              Pertinent Vitals/Pain Pain Assessment: 0-10 Pain Score: 5  Pain Location: lower back with movement Pain Descriptors / Indicators: Aching;Grimacing;Guarding;Sore Pain Intervention(s): Limited activity within patient's tolerance;Monitored during session;Repositioned     Hand Dominance Right   Extremity/Trunk Assessment Upper  Extremity Assessment Upper Extremity Assessment: Overall WFL for tasks assessed           Communication Communication Communication: No difficulties   Cognition Arousal/Alertness: Awake/alert Behavior During Therapy: WFL for tasks assessed/performed Overall Cognitive Status: Within Functional Limits for tasks assessed                                                Home Living Family/patient expects to be discharged to:: Skilled nursing facility Living Arrangements: Spouse/significant other   Type of Home: House Home Access: Stairs to enter [employer] of Steps: 3 Entrance Stairs-Rails: Right Home Layout: Able to live on main level with bedroom/bathroom     Bathroom Shower/Tub: Walk-in shower         Home Equipment: Kasandra Knudsen - single point;Wheelchair - Education administrator (comment);Shower seat;Bedside commode;Walker - 2 wheels;Adaptive equipment Adaptive Equipment: Reacher;Sock aid Additional Comments: Pt is primary caregiver for husband      Prior Functioning/Environment Level of Independence: Independent with assistive device(s)        Comments: Uses 3 wheeled walker for household ambulation, cane for community distances        OT Problem List: Decreased strength;Decreased range of motion;Impaired balance (sitting and/or standing);Pain      OT Treatment/Interventions: Self-care/ADL training;DME and/or AE instruction;Patient/family education;Balance training    OT Goals(Current goals can be found in the care plan section) Acute Rehab OT Goals Patient Stated Goal: get stronger, return home, take care of husband OT Goal Formulation: With patient Time For Goal Achievement: 09/18/19 Potential to Achieve Goals: Good  OT Frequency: Min 2X/week   Barriers to D/C: Decreased caregiver support          Co-evaluation PT/OT/SLP Co-Evaluation/Treatment: Yes Reason for Co-Treatment: For patient/therapist safety PT goals addressed during session:  Mobility/safety with mobility;Balance;Strengthening/ROM;Proper use of DME OT goals addressed during session: Strengthening/ROM;ADL's and self-care      AM-PAC OT "6 Clicks" Daily Activity     Outcome Measure Help from another person eating meals?: None Help from another person taking care of personal grooming?: A Little Help from another person toileting, which includes using toliet, bedpan, or urinal?: A Lot Help from another person bathing (including washing, rinsing, drying)?: A Lot Help from another person to put on and taking off regular upper body clothing?: A Little Help from another person to put on and taking off regular lower body clothing?: A Lot 6 Click Score: 16   End of Session Equipment Utilized During Treatment: Gait belt;Rolling walker Nurse Communication: Mobility status(pt asking about something to help her have a bowel movement)  Activity Tolerance: Patient tolerated treatment well Patient left: in chair;with call bell/phone within reach;with chair alarm set  OT Visit Diagnosis: Unsteadiness on feet (R26.81);Other abnormalities of gait and mobility (R26.89);Muscle weakness (generalized) (M62.81);Pain Pain - Right/Left: (left leg, lower back, left groin)                Time: [id number] OT Time Calculation (min): 18 min Charges:  OT General Charges $OT  Visit: 1 Visit OT Evaluation $OT Eval Moderate Complexity: 1 Mod  Golden Circle, OTR/L Acute [employer] Pager [phone] Office [phone]     Almon Register 09/04/2019, 9:29 AM

## 2019-09-04 NOTE — Progress Notes (Signed)
PROGRESS NOTE    Sydney [name]  [id number] DOB: 1932/06/08 DOA: 09/02/2019 PCP: Venia Carbon, MD    Brief Narrative:  86 y.o. female with medical history significant of HTN, lumbar spine stenosis s/p lumbar spinal fusion, bilateral hip OA status post bilateral total hip replacement, presented with left-sided pelvic pain after a fall.  Last night, patient was in the bathroom trying to reach her urinal on the floor, then tripped and fell down landed on the left sided buttock.  Immediately feeling extremely pain on the left hip.  She was able to get up and walk last night. But this morning pain became extreme.  She denied any other joint or muscle pain, no loss of consciousness no chest pains or short of breath.  ED Course: X-ray showed: fracture left ischium with mild impaction at the fracture site. Total hip replacements bilaterally with prosthetic components well-seated. No dislocation. Osteoporosis.  ED gave patient pain meds and walk the patient around.  Initially patient was doing okay, but when ED try to discharge patient and put the patient in her car, patient complained pain was unbearable and she could not even sit in the car.  Assessment & Plan:   Active Problems:   Pelvic fracture (HCC)   Osteoporosis   Pelvic fracture -Fracture appears stable -Imaging reviewed. Prior surgical hip hardware in place and intact -PT/OT consulted with initial recs for CIR vs SNF -Pt evaluated by CIR staff. As pt is primary caregiver for her husband who is unable to care for himself, pt will need facility capable to accepting both pt and her husband. SW is now following for SNF placement -Continue with analgesia as needed  Osteoporosis -Vitamin D level low at 11.47 -Pt continued on vitamin D and calcium supplement -Recommend outpatient PCP follow-up for bone density study, would also benefit from bisphosphonate  Hypertension -Currently stable and controlled -continue home regimen  as pt tolerates  DVT prophylaxis: Lovenox subq Code Status: Full Family Communication: Pt in room, family not at bedside  Status is: Observation  The patient remains OBS appropriate and will d/c before 2 midnights.  Dispo: The patient is from: Home              Anticipated d/c is to: SNF              Anticipated d/c date is: 1 day              Patient currently is medically stable to d/c., currently awaiting SNF placement per SW   Consultants:     Procedures:     Antimicrobials: Anti-infectives (From admission, onward)   None      Subjective: Still having hip pain, controlled with current analgesic regimen  Objective: Vitals:   09/03/19 1426 09/03/19 2005 09/04/19 0423 09/04/19 1341  BP: (!) 100/54 (!) 114/57 (!) 133/57 115/66  Pulse: 67 66 70 70  Resp: 18 17 18 17   Temp: 97.8 F (36.6 C) 98.3 F (36.8 C) 98.6 F (37 C) 99 F (37.2 C)  TempSrc: Oral Oral Oral Oral  SpO2: 98% 100% 93% 92%  Weight:      Height:       No intake or output data in the 24 hours ending 09/04/19 1536 Filed Weights   09/02/19 0658  Weight: 83 kg    Examination: General exam: Awake, laying in bed, in nad Respiratory system: Normal respiratory effort, no wheezing Cardiovascular system: regular rate, s1, s2 Gastrointestinal system: Soft, nondistended, positive BS Central  nervous system: CN2-12 grossly intact, strength intact Extremities: Perfused, no clubbing Skin: Normal skin turgor, no notable skin lesions seen Psychiatry: Mood normal // no visual hallucinations   Data Reviewed: I have personally reviewed following labs and imaging studies  CBC: Recent Labs  Lab 09/02/19 1305  WBC 8.4  NEUTROABS 6.3  HGB 15.4*  HCT 47.9*  MCV 96.6  PLT [id number]   Basic Metabolic Panel: Recent Labs  Lab 09/02/19 1305  NA 141  K 3.7  CL 104  CO2 25  GLUCOSE 133*  BUN 17  CREATININE 1.01*  CALCIUM 9.8   GFR: Estimated Creatinine Clearance: 44.2 mL/min (A) (by C-G formula based  on SCr of 1.01 mg/dL (H)). Liver Function Tests: No results for input(s): AST, ALT, ALKPHOS, BILITOT, PROT, ALBUMIN in the last 168 hours. No results for input(s): LIPASE, AMYLASE in the last 168 hours. No results for input(s): AMMONIA in the last 168 hours. Coagulation Profile: No results for input(s): INR, PROTIME in the last 168 hours. Cardiac Enzymes: No results for input(s): CKTOTAL, CKMB, CKMBINDEX, TROPONINI in the last 168 hours. BNP (last 3 results) No results for input(s): PROBNP in the last 8760 hours. HbA1C: No results for input(s): HGBA1C in the last 72 hours. CBG: No results for input(s): GLUCAP in the last 168 hours. Lipid Profile: No results for input(s): CHOL, HDL, LDLCALC, TRIG, CHOLHDL, LDLDIRECT in the last 72 hours. Thyroid Function Tests: No results for input(s): TSH, T4TOTAL, FREET4, T3FREE, THYROIDAB in the last 72 hours. Anemia Panel: No results for input(s): VITAMINB12, FOLATE, FERRITIN, TIBC, IRON, RETICCTPCT in the last 72 hours. Sepsis Labs: No results for input(s): PROCALCITON, LATICACIDVEN in the last 168 hours.  Recent Results (from the past 240 hour(s))  SARS Coronavirus 2 by RT PCR (hospital order, performed in [facility] hospital lab) Nasopharyngeal Nasopharyngeal Swab     Status: None   Collection Time: 09/02/19  1:54 PM   Specimen: Nasopharyngeal Swab  Result Value Ref Range Status   SARS Coronavirus 2 NEGATIVE NEGATIVE Final    Comment: (NOTE) SARS-CoV-2 target nucleic acids are NOT DETECTED. The SARS-CoV-2 RNA is generally detectable in upper and lower respiratory specimens during the acute phase of infection. The lowest concentration of SARS-CoV-2 viral copies this assay can detect is 250 copies / mL. A negative result does not preclude SARS-CoV-2 infection and should not be used as the sole basis for treatment or other patient management decisions.  A negative result may occur with improper specimen collection / handling, submission of  specimen other than nasopharyngeal swab, presence of viral mutation(s) within the areas targeted by this assay, and inadequate number of viral copies (<250 copies / mL). A negative result must be combined with clinical observations, patient history, and epidemiological information. Fact Sheet for Patients:   StrictlyIdeas.no Fact Sheet for Healthcare Providers: BankingDealers.co.za This test is not yet approved or cleared  by the [place] FDA and has been authorized for detection and/or diagnosis of SARS-CoV-2 by FDA under an Emergency Use Authorization (EUA).  This EUA will remain in effect (meaning this test can be used) for the duration of the COVID-19 declaration under Section 564(b)(1) of the Act, 21 U.S.C. section 360bbb-3(b)(1), unless the authorization is terminated or revoked sooner. Performed at [facility], [place] [address], [place], [place] [address]      Radiology Studies: No results found.  Scheduled Meds: . aspirin EC  81 mg Oral Daily  . calcium carbonate  1 tablet Oral BID WC  .  enoxaparin (LOVENOX) injection  40 mg Subcutaneous Q24H  . furosemide  20 mg Oral Daily  . nadolol  40 mg Oral Daily  . polyethylene glycol  17 g Oral Daily  . Vitamin D (Ergocalciferol)  50,000 Units Oral Q7 days   Continuous Infusions:   LOS: 0 days   Marylu Lund, MD [facility] Pager On Amion  If 7PM-7AM, please contact night-coverage 09/04/2019, 3:36 PM

## 2019-09-05 ENCOUNTER — Telehealth: Payer: Self-pay | Admitting: Internal Medicine

## 2019-09-05 ENCOUNTER — Telehealth: Payer: Self-pay

## 2019-09-05 DIAGNOSIS — M810 Age-related osteoporosis without current pathological fracture: Secondary | ICD-10-CM | POA: Diagnosis not present

## 2019-09-05 DIAGNOSIS — W19XXXA Unspecified fall, initial encounter: Secondary | ICD-10-CM | POA: Diagnosis not present

## 2019-09-05 DIAGNOSIS — S32602A Unspecified fracture of left ischium, initial encounter for closed fracture: Secondary | ICD-10-CM | POA: Diagnosis not present

## 2019-09-05 LAB — SARS CORONAVIRUS 2 (TAT 6-24 HRS): SARS Coronavirus 2: NEGATIVE

## 2019-09-05 NOTE — TOC Progression Note (Signed)
Transition of Care [facility]) - Progression Note    Patient Details  Name: Sydney [name] MRN: [id number] Date of Birth: [birth year]  Transition of Care [facility]) CM/SW [place], [place] Phone Number: 09/05/2019, 8:40 AM  Clinical Narrative:    CSW acknowledging pt desire for SNF placement with her husband. CSW unable to manage placement of pt from community along with pt but have reached out to pt three offers to see if they could accomodate that request. [facility] is not in network with pt insurance, also no referral was sent to [employer] at this time. Referral sent. Auth started with [facility] Medicare ref [id number].   Will discuss current decisions to be made with pt. If medically stable will need to pick one of the three offers available. Will attempt to assist with communicating about pt husband placement as much as I am able to.   Expected Discharge Plan: Skilled Nursing Facility Barriers to Discharge: Family Issues  Expected Discharge Plan and Services Expected Discharge Plan: [place] In-house Referral: Clinical Social Work   Post Acute Care Choice: [place] Living arrangements for the past 2 months: Single Family Home        Readmission Risk Interventions No flowsheet data found.

## 2019-09-05 NOTE — TOC Progression Note (Signed)
Transition of Care [facility]) - Progression Note    Patient Details  Name: Sydney [name] MRN: [id number] Date of Birth: [birth year]  Transition of Care [employer] [facility]) CM/SW [place], [place] Phone Number: 09/05/2019, 2:23 PM  Clinical Narrative:    CSW has spoken with pt, they would like to accept bed at [facility]. CSW spoke with Abigail Butts in admissions who is aware pt interested in placement for her husband. Per pt Dr. Silvio Pate who is pt and pt husbands PCP is aware of situation and can provide them with anything needed. Main admissions director Narda Rutherford will call and speak with pt tomorrow per Abigail Butts. CSW continues to await insurance auth which is under review.    Expected Discharge Plan: Skilled Nursing Facility Barriers to Discharge: Family Issues  Expected Discharge Plan and Services Expected Discharge Plan: [place] In-house Referral: Clinical Social Work   Post Acute Care Choice: [place] Living arrangements for the past 2 months: Single Family Home   Readmission Risk Interventions No flowsheet data found.

## 2019-09-05 NOTE — Progress Notes (Signed)
Physical Therapy Treatment Patient Details Name: Sydney [name] MRN: [id number] DOB: 03-04-1933 Today's Date: 09/05/2019    History of Present Illness 86 y.o. female with medical history significant of HTN, lumbar spine stenosis s/p lumbar spinal fusion, bilateral hip OA status post bilateral total hip replacement, presented with left-sided pelvic pain after a fall. Xray showed fracture left ischium with mild impaction at the fracture site. Total hip replacements bilaterally with prosthetic components well seated; no dislocation. Osteoporosis noted.     PT Comments    Pt progressing slowly with therapy but does relay mildly decreased pain with ambulation today compared to last session. Pt continues to have difficulty with initiation of sit<>stand due to L groin and low back pain. Mod A needed for sit<>stand, does better when pushing from arms of chair. Min A with RW for ambulation with cues for attending to step height as pt tends to shuffle feet which is how she fell at home. LE there ex performed in bed and chair. PT will continue to follow.    Follow Up Recommendations  SNF;Supervision for mobility/OOB     Equipment Recommendations  Wheelchair (measurements PT);Wheelchair cushion (measurements PT)    Recommendations for Other Services       Precautions / Restrictions Precautions Precautions: Fall Restrictions Weight Bearing Restrictions: No    Mobility  Bed Mobility Overal bed mobility: Needs Assistance Bed Mobility: Supine to Sit     Supine to sit: Min assist     General bed mobility comments: min A for LE's, vc's for sequencing and use of rail, pt needed increased time  Transfers Overall transfer level: Needs assistance Equipment used: Rolling walker (2 wheeled) Transfers: Sit to/from Stand Sit to Stand: Mod assist         General transfer comment: practiced from bed and recliner multiple times. Pt has much easier time from recliner with chair arms. Has dificulty  with fwd wt shift to initiate sit<>stand.   Ambulation/Gait Ambulation/Gait assistance: Min assist Gait Distance (Feet): 20 Feet Assistive device: Rolling walker (2 wheeled) Gait Pattern/deviations: Step-through pattern;Decreased stride length;Decreased dorsiflexion - right;Decreased dorsiflexion - left;Trunk flexed Gait velocity: decreased Gait velocity interpretation: <1.31 ft/sec, indicative of household ambulator General Gait Details: worked on increasing step height which pt was able to do with vc's. Also worked on taking standing rest breaks and resetting posture to minimize trunk flexion with gait   Stairs             Wheelchair Mobility    Modified Rankin (Stroke Patients Only)       Balance Overall balance assessment: Needs assistance Sitting-balance support: No upper extremity supported;Feet supported Sitting balance-Leahy Scale: Good     Standing balance support: No upper extremity supported;During functional activity Standing balance-Leahy Scale: Poor Standing balance comment: required UE support for safety in standing today                            Cognition Arousal/Alertness: Awake/alert Behavior During Therapy: WFL for tasks assessed/performed Overall Cognitive Status: Within Functional Limits for tasks assessed                                        Exercises General Exercises - Lower Extremity Ankle Circles/Pumps: Both;Seated;15 reps;Supine Quad Sets: Both;Seated;10 reps;Supine Gluteal Sets: AROM;Both;15 reps;Seated Long Arc Quad: Both;Seated;15 reps Heel Slides: Both;10 reps;Supine Straight Leg Raises: AAROM;Both;5  reps;Supine    General Comments        Pertinent Vitals/Pain Faces Pain Scale: Hurts even more Pain Location: lower back, groin with movement Pain Descriptors / Indicators: Aching;Grimacing;Guarding;Sore Pain Intervention(s): Limited activity within patient's tolerance;Monitored during session     Home Living                      Prior Function            PT Goals (current goals can now be found in the care plan section) Acute Rehab PT Goals Patient Stated Goal: get stronger, return home, take care of husband PT Goal Formulation: With patient Time For Goal Achievement: 09/17/19 Potential to Achieve Goals: Good Progress towards PT goals: Progressing toward goals    Frequency    Min 3X/week      PT Plan Current plan remains appropriate    Co-evaluation              AM-PAC PT "6 Clicks" Mobility   Outcome Measure  Help needed turning from your back to your side while in a flat bed without using bedrails?: A Little Help needed moving from lying on your back to sitting on the side of a flat bed without using bedrails?: A Little Help needed moving to and from a bed to a chair (including a wheelchair)?: A Little Help needed standing up from a chair using your arms (e.g., wheelchair or bedside chair)?: A Lot Help needed to walk in hospital room?: A Little Help needed climbing 3-5 steps with a railing? : Total 6 Click Score: 15    End of Session Equipment Utilized During Treatment: Gait belt Activity Tolerance: Patient tolerated treatment well Patient left: in chair;with call bell/phone within reach;with chair alarm set Nurse Communication: Mobility status PT Visit Diagnosis: Unsteadiness on feet (R26.81);Other abnormalities of gait and mobility (R26.89);Difficulty in walking, not elsewhere classified (R26.2);Pain Pain - Right/Left: Left Pain - part of body: Hip     Time: [id number] PT Time Calculation (min) (ACUTE ONLY): 16 min  Charges:  $Gait Training: 8-22 mins                     Leighton Roach, [place]  Pager [phone] Office [place] 09/05/2019, 1:56 PM

## 2019-09-05 NOTE — Telephone Encounter (Signed)
Spoke to her. I think either is okay She will let me know what she chooses

## 2019-09-05 NOTE — Progress Notes (Signed)
Nutrition Brief Note  RD consulted for assessment of nutritional needs/ status.   Wt Readings from Last 15 Encounters:  09/02/19 83 kg  03/04/19 83 kg  09/13/18 80.7 kg  03/03/18 84.4 kg  12/24/17 83.5 kg  11/26/17 85.5 kg  09/02/17 89.8 kg  02/03/17 89.8 kg  09/10/16 89.8 kg  07/29/16 87.9 kg  07/17/16 87.1 kg  07/02/16 88 kg  02/26/16 89.3 kg  02/19/16 88.9 kg  01/29/16 88.5 kg   Sydney [name] is a 86 y.o. female with medical history significant of HTN, lumbar spine stenosis s/p lumbar spinal fusion, bilateral hip OA status post bilateral total hip replacement, presented with left-sided pelvic pain after a fall.  Pt admitted with pelvic fracture.   Reviewed I/O's: 0 ml x 24 hours  Pt in with CSW at time of visit; plan for SNF at discharge.   Reviewed wt hx; wt has been stable over the past year.   Medications reviewed and include lasix, calcium carbonate, and miralax.   Per TOC team notes, plan to d/c to SNF once medically stable.   Labs reviewed.   Current diet order is Heart Healthy, patient is consuming approximately 75-100% of meals at this time. Labs and medications reviewed.   No nutrition interventions warranted at this time. If nutrition issues arise, please consult RD.   Loistine Chance, RD, LDN, [place] Registered Dietitian II Certified Diabetes Care and Education Specialist Please refer to [facility] for RD and/or RD on-call/weekend/after hours pager

## 2019-09-05 NOTE — Telephone Encounter (Signed)
Pt fell and broker her pelvis.  She is not unable to take care of her husband.  She and her husband both are needing to go into an [facility] until she is recuperated.  They are not able to get into [facility] so they are needing to be admitted to either [facility] or [facility].   Patient is requesting that Dr Silvio Pate advise which would be best.   Pt is currently admitted but she can be called directly at her room or we can call her Son, Sydney [name].  Both numbers listed on visit info.

## 2019-09-05 NOTE — Progress Notes (Signed)
Occupational Therapy Treatment Patient Details Name: Sydney [name] MRN: [id number] DOB: 1933/01/02 Today's Date: 09/05/2019    History of present illness 86 y.o. female with medical history significant of HTN, lumbar spine stenosis s/p lumbar spinal fusion, bilateral hip OA status post bilateral total hip replacement, presented with left-sided pelvic pain after a fall. Xray showed fracture left ischium with mild impaction at the fracture site. Total hip replacements bilaterally with prosthetic components well seated; no dislocation. Osteoporosis noted.    OT comments  Pt semi-reclined in bed upon arrival. Pt reports having 10/10 pain in her pelvis area, nursing recently administered pain medication. Pt declined sitting EOB and moving throughout the bedroom. However, pt agreeable to bed level BUE therapeutic exercises. Pt and OT discussed home environment and current AE/DME. Pt confirms having all the equipment necessary for home. Instructed pt in BUE exercises in order to improve strength for transferring and participating in ADLs/IADLs. Pt tolerated the BUE exercises well, requiring minimal rest breaks. Educated pt on deep breathing technique, pt receptive. Nursing present to perform COVID-19 swab at end of tx session. Will continue to follow acutely as able.    Follow Up Recommendations  SNF(pt prefers assisted living with husband- case manager aware)    Equipment Recommendations  None recommended by OT    Recommendations for Other Services      Precautions / Restrictions Precautions Precautions: Fall Restrictions Weight Bearing Restrictions: No       Mobility Bed Mobility Overal bed mobility: Needs Assistance Bed Mobility: Supine to Sit     Supine to sit: Min assist     General bed mobility comments: Pt declined sitting EOB secondary to pain; pt comfortably semi-reclined in bed   Transfers Overall transfer level: Needs assistance Equipment used: Rolling walker (2  wheeled) Transfers: Sit to/from Stand Sit to Stand: Mod assist         General transfer comment: practiced from bed and recliner multiple times. Pt has much easier time from recliner with chair arms. Has dificulty with fwd wt shift to initiate sit<>stand.     Balance Overall balance assessment: Needs assistance Sitting-balance support: No upper extremity supported;Feet supported Sitting balance-Leahy Scale: Good     Standing balance support: No upper extremity supported;During functional activity Standing balance-Leahy Scale: Poor Standing balance comment: required UE support for safety in standing today                           ADL either performed or assessed with clinical judgement   ADL                                         General ADL Comments: pt verbalized performing all self-care activities earlier this morning with supervision     Vision       Perception     Praxis      Cognition Arousal/Alertness: Awake/alert Behavior During Therapy: WFL for tasks assessed/performed Overall Cognitive Status: Within Functional Limits for tasks assessed                                 General Comments: pt kindly declined transitioning to EOB secondary to pelvic pain; agreeable to bed level therapeutic exercises        Exercises Exercises: General Upper Extremity General Exercises - Upper  Extremity Shoulder Flexion: AROM;Strengthening;Both;10 reps;Supine Shoulder ABduction: AROM;Strengthening;Both;10 reps;Supine Elbow Flexion: AROM;Strengthening;10 reps;Supine Elbow Extension: AROM;Strengthening;Both;10 reps;Supine General Exercises - Lower Extremity Ankle Circles/Pumps: Both;Seated;15 reps;Supine Quad Sets: Both;Seated;10 reps;Supine Gluteal Sets: AROM;Both;15 reps;Seated Long Arc Quad: Both;Seated;15 reps Heel Slides: Both;10 reps;Supine Straight Leg Raises: AAROM;Both;5 reps;Supine   Shoulder Instructions        General Comments Discussed house environment setup; current AE/DME at home; pt confirmed having everthing she needs for home but prefers to continue rehab at an assisted living with her husband    Pertinent Vitals/ Pain       Pain Assessment: 0-10 Pain Score: 2 (originally 10/10 but nursing administered pain meds  ) Faces Pain Scale: Hurts even more Pain Location: lower back, groin with movement Pain Descriptors / Indicators: Aching;Grimacing;Guarding;Sore Pain Intervention(s): Limited activity within patient's tolerance;Monitored during session  Home Living                                          Prior Functioning/Environment              Frequency  Min 2X/week        Progress Toward Goals  OT Goals(current goals can now be found in the care plan section)  Progress towards OT goals: Progressing toward goals  Acute Rehab OT Goals Patient Stated Goal: get stronger, return home, take care of husband OT Goal Formulation: With patient Time For Goal Achievement: 09/18/19 Potential to Achieve Goals: Good  Plan Discharge plan remains appropriate    Co-evaluation                 AM-PAC OT "6 Clicks" Daily Activity     Outcome Measure   Help from another person eating meals?: None Help from another person taking care of personal grooming?: A Little Help from another person toileting, which includes using toliet, bedpan, or urinal?: A Lot Help from another person bathing (including washing, rinsing, drying)?: A Lot Help from another person to put on and taking off regular upper body clothing?: A Little Help from another person to put on and taking off regular lower body clothing?: A Lot 6 Click Score: 16    End of Session    OT Visit Diagnosis: Unsteadiness on feet (R26.81);Other abnormalities of gait and mobility (R26.89);Muscle weakness (generalized) (M62.81);Pain Pain - Right/Left: (pelvic area) Pain - part of body: (pelvis)   Activity  Tolerance Patient limited by fatigue;Patient limited by pain   Patient Left in bed;with call bell/phone within reach;with nursing/sitter in room   Nurse Communication Mobility status        Time: [id number] OT Time Calculation (min): 15 min  Charges: OT General Charges $OT Visit: 1 Visit OT Treatments $Therapeutic Exercise: 8-22 mins  Michel Bickers, OTR/L [facility] [place] 09/05/2019, 4:39 PM

## 2019-09-05 NOTE — Progress Notes (Signed)
PROGRESS NOTE    Sydney [name]  [id number] DOB: [birth year] DOA: 09/02/2019 PCP: Venia Carbon, MD    Brief Narrative:  86 y.o. female with medical history significant of HTN, lumbar spine stenosis s/p lumbar spinal fusion, bilateral hip OA status post bilateral total hip replacement, presented with left-sided pelvic pain after a fall.  Last night, patient was in the bathroom trying to reach her urinal on the floor, then tripped and fell down landed on the left sided buttock.  Immediately feeling extremely pain on the left hip.  She was able to get up and walk last night. But this morning pain became extreme.  She denied any other joint or muscle pain, no loss of consciousness no chest pains or short of breath.  ED Course: X-ray showed: fracture left ischium with mild impaction at the fracture site. Total hip replacements bilaterally with prosthetic components well-seated. No dislocation. Osteoporosis.  ED gave patient pain meds and walk the patient around.  Initially patient was doing okay, but when ED try to discharge patient and put the patient in her car, patient complained pain was unbearable and she could not even sit in the car.  Assessment & Plan:   Active Problems:   Pelvic fracture (HCC)   Osteoporosis   Pelvic fracture -Fracture appears stable -Imaging reviewed. Prior surgical hip hardware in place and intact -PT/OT consulted with initial recs for CIR vs SNF -Pt evaluated by CIR staff. As pt is primary caregiver for her husband who is unable to care for himself, pt will need facility capable to accepting both pt and her husband. SW is now following for SNF placement -Continue with analgesia as tolerated -COVID test for placement is pending  Osteoporosis -Vitamin D level low at 11.47 -Pt continued on vitamin D and calcium supplement -Recommend outpatient PCP follow-up for bone density study, would also benefit from bisphosphonate  Hypertension -Currently stable  and controlled -continue home regimen as pt tolerates  DVT prophylaxis: Lovenox subq Code Status: Full Family Communication: Pt in room, family not at bedside  Status is: Observation  The patient remains OBS appropriate and will d/c before 2 midnights.  Dispo: The patient is from: Home              Anticipated d/c is to: SNF              Anticipated d/c date is: 1 day              Patient currently is medically stable to d/c., currently awaiting SNF placement per SW   Consultants:     Procedures:     Antimicrobials: Anti-infectives (From admission, onward)   None      Subjective: Eager starting therapy  Objective: Vitals:   09/04/19 1341 09/04/19 2050 09/05/19 0414 09/05/19 1405  BP: 115/66 (!) 121/52 (!) 141/56 96/80  Pulse: 70 80 62 68  Resp: 17 18 16 19   Temp: 99 F (37.2 C) 98 F (36.7 C) 98.1 F (36.7 C) 98 F (36.7 C)  TempSrc: Oral Oral Oral Oral  SpO2: 92% 95% 98% 97%  Weight:      Height:        Intake/Output Summary (Last 24 hours) at 09/05/2019 1547 Last data filed at 09/05/2019 1100 Gross per 24 hour  Intake 120 ml  Output --  Net 120 ml   Filed Weights   09/02/19 0658  Weight: 83 kg    Examination: General exam: Conversant, in no acute distress Respiratory system:  normal chest rise, clear, no audible wheezing Cardiovascular system: regular rhythm, s1-s2 Gastrointestinal system: Nondistended, nontender, pos BS Central nervous system: No seizures, no tremors Extremities: No cyanosis, no joint deformities Skin: No rashes, no pallor Psychiatry: Affect normal // no auditory hallucinations   Data Reviewed: I have personally reviewed following labs and imaging studies  CBC: Recent Labs  Lab 09/02/19 1305  WBC 8.4  NEUTROABS 6.3  HGB 15.4*  HCT 47.9*  MCV 96.6  PLT [id number]   Basic Metabolic Panel: Recent Labs  Lab 09/02/19 1305  NA 141  K 3.7  CL 104  CO2 25  GLUCOSE 133*  BUN 17  CREATININE 1.01*  CALCIUM 9.8    GFR: Estimated Creatinine Clearance: 44.2 mL/min (A) (by C-G formula based on SCr of 1.01 mg/dL (H)). Liver Function Tests: No results for input(s): AST, ALT, ALKPHOS, BILITOT, PROT, ALBUMIN in the last 168 hours. No results for input(s): LIPASE, AMYLASE in the last 168 hours. No results for input(s): AMMONIA in the last 168 hours. Coagulation Profile: No results for input(s): INR, PROTIME in the last 168 hours. Cardiac Enzymes: No results for input(s): CKTOTAL, CKMB, CKMBINDEX, TROPONINI in the last 168 hours. BNP (last 3 results) No results for input(s): PROBNP in the last 8760 hours. HbA1C: No results for input(s): HGBA1C in the last 72 hours. CBG: No results for input(s): GLUCAP in the last 168 hours. Lipid Profile: No results for input(s): CHOL, HDL, LDLCALC, TRIG, CHOLHDL, LDLDIRECT in the last 72 hours. Thyroid Function Tests: No results for input(s): TSH, T4TOTAL, FREET4, T3FREE, THYROIDAB in the last 72 hours. Anemia Panel: No results for input(s): VITAMINB12, FOLATE, FERRITIN, TIBC, IRON, RETICCTPCT in the last 72 hours. Sepsis Labs: No results for input(s): PROCALCITON, LATICACIDVEN in the last 168 hours.  Recent Results (from the past 240 hour(s))  SARS Coronavirus 2 by RT PCR (hospital order, performed in [facility] hospital lab) Nasopharyngeal Nasopharyngeal Swab     Status: None   Collection Time: 09/02/19  1:54 PM   Specimen: Nasopharyngeal Swab  Result Value Ref Range Status   SARS Coronavirus 2 NEGATIVE NEGATIVE Final    Comment: (NOTE) SARS-CoV-2 target nucleic acids are NOT DETECTED. The SARS-CoV-2 RNA is generally detectable in upper and lower respiratory specimens during the acute phase of infection. The lowest concentration of SARS-CoV-2 viral copies this assay can detect is 250 copies / mL. A negative result does not preclude SARS-CoV-2 infection and should not be used as the sole basis for treatment or other patient management decisions.  A negative  result may occur with improper specimen collection / handling, submission of specimen other than nasopharyngeal swab, presence of viral mutation(s) within the areas targeted by this assay, and inadequate number of viral copies (<250 copies / mL). A negative result must be combined with clinical observations, patient history, and epidemiological information. Fact Sheet for Patients:   StrictlyIdeas.no Fact Sheet for Healthcare Providers: BankingDealers.co.za This test is not yet approved or cleared  by the [place] FDA and has been authorized for detection and/or diagnosis of SARS-CoV-2 by FDA under an Emergency Use Authorization (EUA).  This EUA will remain in effect (meaning this test can be used) for the duration of the COVID-19 declaration under Section 564(b)(1) of the Act, 21 U.S.C. section 360bbb-3(b)(1), unless the authorization is terminated or revoked sooner. Performed at [facility], [place] [address] [place], [place] [address]      Radiology Studies: No results found.  Scheduled Meds: . aspirin EC  81 mg Oral Daily  . calcium carbonate  1 tablet Oral BID WC  . enoxaparin (LOVENOX) injection  40 mg Subcutaneous Q24H  . furosemide  20 mg Oral Daily  . nadolol  40 mg Oral Daily  . polyethylene glycol  17 g Oral Daily  . Vitamin D (Ergocalciferol)  50,000 Units Oral Q7 days   Continuous Infusions:   LOS: 0 days   Marylu Lund, MD [facility] Pager On Amion  If 7PM-7AM, please contact night-coverage 09/05/2019, 3:47 PM

## 2019-09-05 NOTE — Plan of Care (Signed)

## 2019-09-05 NOTE — TOC Progression Note (Addendum)
Transition of Care [facility]) - Progression Note    Patient Details  Name: Sydney [name] MRN: [id number] Date of Birth: 07/24/1932  Transition of Care [facility]) CM/SW [place], [place] Phone Number: 09/05/2019, 12:47 PM  Clinical Narrative:    CSW has visited with pt x3, we have discussed pt offers and CSW provided her with CMS list. She understands [facility] is not in network with her insurance, she is currently undecided between [facility] and [facility]. She understands that currently pt husbands PCP would need to assist with pt husband placement (as CSW cannot access his medical record since he is not in my care) but we first need a decision from her to complete prior authorization for her placement. Both [facility] and [facility] are willing to work with pt/pt family to place pt.   We discussed that this writer cannot pick for pt or tell her which to go to. She called her PCP office and is hoping he can guide her to which he may recommend. Pt aware that we may not be able to get pt husband moved at the same time as her but that she is medically stable, her insurance Josem Kaufmann is pending and we need a choice.   CSW will f/u again with pt to try and elicit a choice. Requested MD place new COVID.   Expected Discharge Plan: Skilled Nursing Facility Barriers to Discharge: Family Issues  Expected Discharge Plan and Services Expected Discharge Plan: [place] In-house Referral: Clinical Social Work   Post Acute Care Choice: [place] Living arrangements for the past 2 months: Single Family Home    Readmission Risk Interventions No flowsheet data found.

## 2019-09-05 NOTE — Telephone Encounter (Signed)
See other phone note Spoke to patient herself

## 2019-09-05 NOTE — Telephone Encounter (Signed)
Noted  

## 2019-09-05 NOTE — Telephone Encounter (Signed)
Patient's son Sydney [name] contacted the office and states that's his mother is going to be discharged from the hospital soon, and he would like her to go to [facility]. He also states that his father, Sydney [name], would like to go to [facility] too. Patient's son is requesting that Dr. Silvio Pate give him a call back about this.  He can be reached at [phone]

## 2019-09-06 DIAGNOSIS — Z20822 Contact with and (suspected) exposure to covid-19: Secondary | ICD-10-CM | POA: Diagnosis not present

## 2019-09-06 DIAGNOSIS — M5136 Other intervertebral disc degeneration, lumbar region: Secondary | ICD-10-CM | POA: Diagnosis not present

## 2019-09-06 DIAGNOSIS — Z96643 Presence of artificial hip joint, bilateral: Secondary | ICD-10-CM | POA: Diagnosis not present

## 2019-09-06 DIAGNOSIS — M48061 Spinal stenosis, lumbar region without neurogenic claudication: Secondary | ICD-10-CM | POA: Diagnosis not present

## 2019-09-06 DIAGNOSIS — Z7401 Bed confinement status: Secondary | ICD-10-CM | POA: Diagnosis not present

## 2019-09-06 DIAGNOSIS — E785 Hyperlipidemia, unspecified: Secondary | ICD-10-CM | POA: Diagnosis not present

## 2019-09-06 DIAGNOSIS — S32602D Unspecified fracture of left ischium, subsequent encounter for fracture with routine healing: Secondary | ICD-10-CM | POA: Diagnosis not present

## 2019-09-06 DIAGNOSIS — Z03818 Encounter for observation for suspected exposure to other biological agents ruled out: Secondary | ICD-10-CM | POA: Diagnosis not present

## 2019-09-06 DIAGNOSIS — R2689 Other abnormalities of gait and mobility: Secondary | ICD-10-CM | POA: Diagnosis not present

## 2019-09-06 DIAGNOSIS — G20 Parkinson's disease: Secondary | ICD-10-CM | POA: Diagnosis not present

## 2019-09-06 DIAGNOSIS — Z20828 Contact with and (suspected) exposure to other viral communicable diseases: Secondary | ICD-10-CM | POA: Diagnosis not present

## 2019-09-06 DIAGNOSIS — E782 Mixed hyperlipidemia: Secondary | ICD-10-CM | POA: Diagnosis not present

## 2019-09-06 DIAGNOSIS — I10 Essential (primary) hypertension: Secondary | ICD-10-CM | POA: Diagnosis not present

## 2019-09-06 DIAGNOSIS — M810 Age-related osteoporosis without current pathological fracture: Secondary | ICD-10-CM | POA: Diagnosis not present

## 2019-09-06 DIAGNOSIS — S329XXA Fracture of unspecified parts of lumbosacral spine and pelvis, initial encounter for closed fracture: Secondary | ICD-10-CM | POA: Diagnosis not present

## 2019-09-06 DIAGNOSIS — S3289XA Fracture of other parts of pelvis, initial encounter for closed fracture: Secondary | ICD-10-CM | POA: Diagnosis not present

## 2019-09-06 DIAGNOSIS — S32810A Multiple fractures of pelvis with stable disruption of pelvic ring, initial encounter for closed fracture: Secondary | ICD-10-CM | POA: Diagnosis not present

## 2019-09-06 DIAGNOSIS — M5137 Other intervertebral disc degeneration, lumbosacral region: Secondary | ICD-10-CM | POA: Diagnosis not present

## 2019-09-06 DIAGNOSIS — I723 Aneurysm of iliac artery: Secondary | ICD-10-CM | POA: Diagnosis not present

## 2019-09-06 DIAGNOSIS — F319 Bipolar disorder, unspecified: Secondary | ICD-10-CM | POA: Diagnosis not present

## 2019-09-06 DIAGNOSIS — F419 Anxiety disorder, unspecified: Secondary | ICD-10-CM | POA: Diagnosis not present

## 2019-09-06 DIAGNOSIS — Z981 Arthrodesis status: Secondary | ICD-10-CM | POA: Diagnosis not present

## 2019-09-06 DIAGNOSIS — R2681 Unsteadiness on feet: Secondary | ICD-10-CM | POA: Diagnosis not present

## 2019-09-06 DIAGNOSIS — R269 Unspecified abnormalities of gait and mobility: Secondary | ICD-10-CM | POA: Diagnosis not present

## 2019-09-06 DIAGNOSIS — E669 Obesity, unspecified: Secondary | ICD-10-CM | POA: Diagnosis not present

## 2019-09-06 DIAGNOSIS — S32601D Unspecified fracture of right ischium, subsequent encounter for fracture with routine healing: Secondary | ICD-10-CM | POA: Diagnosis not present

## 2019-09-06 DIAGNOSIS — M2550 Pain in unspecified joint: Secondary | ICD-10-CM | POA: Diagnosis not present

## 2019-09-06 DIAGNOSIS — I959 Hypotension, unspecified: Secondary | ICD-10-CM | POA: Diagnosis not present

## 2019-09-06 DIAGNOSIS — M549 Dorsalgia, unspecified: Secondary | ICD-10-CM | POA: Diagnosis not present

## 2019-09-06 DIAGNOSIS — M353 Polymyalgia rheumatica: Secondary | ICD-10-CM | POA: Diagnosis not present

## 2019-09-06 DIAGNOSIS — Z9181 History of falling: Secondary | ICD-10-CM | POA: Diagnosis not present

## 2019-09-06 DIAGNOSIS — S32602A Unspecified fracture of left ischium, initial encounter for closed fracture: Secondary | ICD-10-CM | POA: Diagnosis not present

## 2019-09-06 DIAGNOSIS — R278 Other lack of coordination: Secondary | ICD-10-CM | POA: Diagnosis not present

## 2019-09-06 DIAGNOSIS — W19XXXA Unspecified fall, initial encounter: Secondary | ICD-10-CM | POA: Diagnosis not present

## 2019-09-06 DIAGNOSIS — K219 Gastro-esophageal reflux disease without esophagitis: Secondary | ICD-10-CM | POA: Diagnosis not present

## 2019-09-06 MED ORDER — POLYETHYLENE GLYCOL 3350 17 G PO PACK: 17.0000 g | PACK | Freq: Every day | ORAL | 0 refills | Status: DC

## 2019-09-06 MED ORDER — SENNOSIDES-DOCUSATE SODIUM 8.6-50 MG PO TABS: 1.0000 | ORAL_TABLET | Freq: Every evening | ORAL | Status: DC | PRN

## 2019-09-06 MED ORDER — HYDROCODONE-ACETAMINOPHEN 5-325 MG PO TABS: 1.0000 | ORAL_TABLET | Freq: Four times a day (QID) | ORAL | 0 refills | Status: DC | PRN

## 2019-09-06 MED ORDER — BISACODYL 5 MG PO TBEC: 5.0000 mg | DELAYED_RELEASE_TABLET | Freq: Every day | ORAL | 0 refills | Status: DC | PRN

## 2019-09-06 MED ORDER — CALCIUM CARBONATE 1250 (500 CA) MG PO TABS: 1.0000 | ORAL_TABLET | Freq: Two times a day (BID) | ORAL | 0 refills | Status: DC

## 2019-09-06 MED ORDER — VITAMIN D (ERGOCALCIFEROL) 1.25 MG (50000 UNIT) PO CAPS: 50000.0000 [IU] | ORAL_CAPSULE | ORAL | 0 refills | Status: DC

## 2019-09-06 MED ORDER — TRAMADOL HCL 50 MG PO TABS: 50.0000 mg | ORAL_TABLET | Freq: Four times a day (QID) | ORAL | 0 refills | Status: DC | PRN

## 2019-09-06 NOTE — TOC Progression Note (Addendum)
Transition of Care [facility]) - Progression Note    Patient Details  Name: Sydney [name] MRN: [id number] Date of Birth: 02-02-1933  Transition of Care [facility]) CM/SW [place], [place] Phone Number: 09/06/2019, 10:17 AM  Clinical Narrative:    Pt bed available today at [facility], they have been in touch with pt for assisting with his placement. Paperwork to be sent to this Probation officer for pt to complete.   Josem Kaufmann Ref [id number], approved 5/25-5/27 Auth ID [id number]   Expected Discharge Plan: Skilled Nursing Facility Barriers to Discharge: Family Issues  Expected Discharge Plan and Services Expected Discharge Plan: [place] In-house Referral: Clinical Social Work Post Acute Care Choice: [place] Living arrangements for the past 2 months: Single Family Home     Readmission Risk Interventions No flowsheet data found.

## 2019-09-06 NOTE — TOC Transition Note (Signed)
Transition of Care [facility]) - CM/SW Discharge Note   Patient Details  Name: Sydney [name] MRN: [id number] Date of Birth: [birth year]  Transition of Care [facility]) CM/SW Contact:  Alexander Mt, LCSW Phone Number:  09/06/2019, 2:24 PM   Clinical Narrative:    CSW arranged d/c all paperwork and PTAR forms on chart including signed prescriptions and [facility] paperwork. Pt paperwork was completed and RNCM faxed it to facility.  Pt aware that d/c today, still expresses worry about her husband joining her but knows that is between SNF and PCP office to help facilitate that. Report to be called by RN, PTAR called for 3pm.    Final next level of care: Skilled Nursing Facility Barriers to Discharge: Barriers Resolved   Patient Goals and CMS Choice Patient states their goals for this hospitalization and ongoing recovery are:: ":I think I am going to need some rehab" CMS Medicare.gov Compare Post Acute Care list provided to:: Patient Choice offered to / list presented to : Patient  Discharge Placement PASRR number recieved: 09/04/19         Patient chooses bed at: [facility] Patient to be transferred to facility by: [facility] Name of family member notified: pt responsible for self Patient and family notified of of transfer: 09/06/19  Discharge Plan and Services In-house Referral: Clinical Social Work   Post Acute Care Choice: [place]             Readmission Risk Interventions No flowsheet data found.

## 2019-09-06 NOTE — Plan of Care (Signed)
  Problem: Education: Goal: Knowledge of General Education information will improve Description: Including pain rating scale, medication(s)/side effects and non-pharmacologic comfort measures 09/06/2019 1123 by Minna Antis, RN Outcome: Progressing 09/06/2019 1123 by Minna Antis, RN Outcome: Progressing   Problem: Health Behavior/Discharge Planning: Goal: Ability to manage health-related needs will improve 09/06/2019 1123 by Minna Antis, RN Outcome: Progressing 09/06/2019 1123 by Minna Antis, RN Outcome: Progressing   Problem: Clinical Measurements: Goal: Ability to maintain clinical measurements within normal limits will improve 09/06/2019 1123 by Minna Antis, RN Outcome: Progressing 09/06/2019 1123 by Minna Antis, RN Outcome: Progressing Goal: Will remain free from infection 09/06/2019 1123 by Minna Antis, RN Outcome: Progressing 09/06/2019 1123 by Minna Antis, RN Outcome: Progressing Goal: Diagnostic test results will improve 09/06/2019 1123 by Minna Antis, RN Outcome: Progressing 09/06/2019 1123 by Minna Antis, RN Outcome: Progressing Goal: Respiratory complications will improve 09/06/2019 1123 by Minna Antis, RN Outcome: Progressing 09/06/2019 1123 by Minna Antis, RN Outcome: Progressing Goal: Cardiovascular complication will be avoided 09/06/2019 1123 by Minna Antis, RN Outcome: Progressing 09/06/2019 1123 by Minna Antis, RN Outcome: Progressing   Problem: Activity: Goal: Risk for activity intolerance will decrease 09/06/2019 1123 by Minna Antis, RN Outcome: Progressing 09/06/2019 1123 by Minna Antis, RN Outcome: Progressing   Problem: Nutrition: Goal: Adequate nutrition will be maintained 09/06/2019 1123 by Minna Antis, RN Outcome: Progressing 09/06/2019 1123 by Minna Antis, RN Outcome: Progressing   Problem: Coping: Goal: Level of anxiety will decrease 09/06/2019 1123 by Minna Antis, RN Outcome:  Progressing 09/06/2019 1123 by Minna Antis, RN Outcome: Progressing   Problem: Elimination: Goal: Will not experience complications related to bowel motility 09/06/2019 1123 by Minna Antis, RN Outcome: Progressing 09/06/2019 1123 by Minna Antis, RN Outcome: Progressing Goal: Will not experience complications related to urinary retention 09/06/2019 1123 by Minna Antis, RN Outcome: Progressing 09/06/2019 1123 by Minna Antis, RN Outcome: Progressing   Problem: Pain Managment: Goal: General experience of comfort will improve 09/06/2019 1123 by Minna Antis, RN Outcome: Progressing 09/06/2019 1123 by Minna Antis, RN Outcome: Progressing   Problem: Safety: Goal: Ability to remain free from injury will improve 09/06/2019 1123 by Minna Antis, RN Outcome: Progressing 09/06/2019 1123 by Minna Antis, RN Outcome: Progressing   Problem: Skin Integrity: Goal: Risk for impaired skin integrity will decrease 09/06/2019 1123 by Minna Antis, RN Outcome: Progressing 09/06/2019 1123 by Minna Antis, RN Outcome: Progressing

## 2019-09-06 NOTE — Progress Notes (Signed)
Darin Engels to be D/C'd  per MD order. Discussed with the patient and all questions fully answered.  VSS, Skin clean, dry and intact without evidence of skin break down, no evidence of skin tears noted.  IV catheter discontinued intact. Site without signs and symptoms of complications. Dressing and pressure applied.  Patient escorted via PTAR to [facility].

## 2019-09-06 NOTE — Progress Notes (Signed)
Physical Therapy Treatment Patient Details Name: Sydney [name] MRN: [id number] DOB: 1932/05/02 Today's Date: 09/06/2019    History of Present Illness 86 y.o. female with medical history significant of HTN, lumbar spine stenosis s/p lumbar spinal fusion, bilateral hip OA status post bilateral total hip replacement, presented with left-sided pelvic pain after a fall. Xray showed fracture left ischium with mild impaction at the fracture site. Total hip replacements bilaterally with prosthetic components well seated; no dislocation. Osteoporosis noted.     PT Comments    Pt making good progress with mobility today. Was able to ambulate 100' with RW and min A. Main limitation with mobility continues to be sit<>stand due to increased pelvic pain with the movement. Mod A needed for this transfer. Pt performed seated and standing there ex and is hopeful to d/c to SNF today.    Follow Up Recommendations  SNF;Supervision for mobility/OOB     Equipment Recommendations  None recommended by PT    Recommendations for Other Services       Precautions / Restrictions Precautions Precautions: Fall Restrictions Weight Bearing Restrictions: No    Mobility  Bed Mobility Overal bed mobility: Needs Assistance Bed Mobility: Supine to Sit;Sit to Supine     Supine to sit: Supervision Sit to supine: Min assist   General bed mobility comments: pt able to roll to L and come to sitting EOB with use of rail but no physical assist. Min A to LE's for return to supine  Transfers Overall transfer level: Needs assistance Equipment used: Rolling walker (2 wheeled) Transfers: Sit to/from Stand Sit to Stand: Mod assist         General transfer comment: continues to need mod A for power up, mostly due to pain with initial [place]  Ambulation/Gait Ambulation/Gait assistance: Min assist Gait Distance (Feet): 100 Feet Assistive device: Rolling walker (2 wheeled) Gait Pattern/deviations: Step-through  pattern;Decreased stride length;Trunk flexed Gait velocity: decreased Gait velocity interpretation: <1.31 ft/sec, indicative of household ambulator General Gait Details: pt with improved step height today as well as increased tolerance for distance. Continues to need vc's for erect posture.    Stairs             Wheelchair Mobility    Modified Rankin (Stroke Patients Only)       Balance Overall balance assessment: Needs assistance Sitting-balance support: No upper extremity supported;Feet supported Sitting balance-Leahy Scale: Good     Standing balance support: No upper extremity supported;During functional activity Standing balance-Leahy Scale: Poor Standing balance comment: able to stand with unilateral UE support today                            Cognition Arousal/Alertness: Awake/alert Behavior During Therapy: WFL for tasks assessed/performed Overall Cognitive Status: Within Functional Limits for tasks assessed                                        Exercises General Exercises - Lower Extremity Long Arc Quad: Both;Seated;15 reps Hip ABduction/ADduction: AROM;Both;10 reps;Standing Hip Flexion/Marching: AROM;Both;10 reps;Seated Other Exercises Other Exercises: standing balance exercises at counter    General Comments        Pertinent Vitals/Pain Pain Assessment: Faces Faces Pain Scale: Hurts little more Pain Location: lower back, groin with movement Pain Descriptors / Indicators: Aching;Grimacing;Guarding;Sore Pain Intervention(s): Limited activity within patient's tolerance;Monitored during session    Home  Living                      Prior Function            PT Goals (current goals can now be found in the care plan section) Acute Rehab PT Goals Patient Stated Goal: go to rehab, husband be able to come to same rehab PT Goal Formulation: With patient Time For Goal Achievement: 09/17/19 Potential to Achieve  Goals: Good Progress towards PT goals: Progressing toward goals    Frequency    Min 3X/week      PT Plan Current plan remains appropriate    Co-evaluation              AM-PAC PT "6 Clicks" Mobility   Outcome Measure  Help needed turning from your back to your side while in a flat bed without using bedrails?: A Little Help needed moving from lying on your back to sitting on the side of a flat bed without using bedrails?: A Little Help needed moving to and from a bed to a chair (including a wheelchair)?: A Little Help needed standing up from a chair using your arms (e.g., wheelchair or bedside chair)?: A Lot Help needed to walk in hospital room?: A Little Help needed climbing 3-5 steps with a railing? : A Lot 6 Click Score: 16    End of Session Equipment Utilized During Treatment: Gait belt Activity Tolerance: Patient tolerated treatment well Patient left: with call bell/phone within reach;in bed Nurse Communication: Mobility status PT Visit Diagnosis: Unsteadiness on feet (R26.81);Other abnormalities of gait and mobility (R26.89);Difficulty in walking, not elsewhere classified (R26.2);Pain Pain - Right/Left: Left Pain - part of body: Hip     Time: [id number] PT Time Calculation (min) (ACUTE ONLY): 20 min  Charges:  $Gait Training: 8-22 mins                     Leighton Roach, PT  Acute Rehab Services  Pager [phone] Office [place] 09/06/2019, 4:20 PM

## 2019-09-06 NOTE — Care Management (Signed)
Paperwork faxed to [facility] [phone]. Hard copies placed in drawer outside of patient's room.  Magdalen Spatz RN

## 2019-09-06 NOTE — TOC CAGE-AID Note (Signed)
Transition of Care [facility]) - CAGE-AID Screening   Patient Details  Name: Sydney [name] MRN: [id number] Date of Birth: [birth year]  Transition of Care [facility]) CM/SW Contact:    Emeterio Reeve, [place] Phone Number: 09/06/2019, 12:46 PM   Clinical Narrative:  Pt denied alcohol use and substance use.   CAGE-AID Screening:    Have You Ever Felt You Ought to Cut Down on Your Drinking or Drug Use?: No Have People Annoyed You By Critizing Your Drinking Or Drug Use?: No Have You Felt Bad Or Guilty About Your Drinking Or Drug Use?: No Have You Ever Had a Drink or Used Drugs First Thing In The Morning to STeady Your Nerves or to Get Rid of a Hangover?: No CAGE-AID Score: 0       Blima Ledger, [facility] Social Worker [phone]

## 2019-09-06 NOTE — Progress Notes (Signed)
This RN called and gave report to [employer], Therapist, sports at [employer]. CSW arranged ambulance transport via [facility] to [facility].

## 2019-09-06 NOTE — Social Work (Signed)
Clinical Social Worker facilitated patient discharge including contacting patient family and facility to confirm patient discharge plans.  Clinical information faxed to facility and family agreeable with plan.  CSW arranged ambulance transport via [facility] to [facility] RN to call [phone]  with report prior to discharge.  Clinical Social Worker will sign off for now as social work intervention is no longer needed. Please consult us again if new need arises.  Hajira Verhagen, MSW, LCSW Clinical Social Worker   

## 2019-09-06 NOTE — Discharge Summary (Addendum)
Physician Discharge Summary  Sydney [name] [id number] DOB: 08-Nov-1932 DOA: 09/02/2019  PCP: Venia Carbon, MD  Admit date: 09/02/2019 Discharge date: 09/06/2019  Admitted From: Home Disposition:  SNF  Recommendations for Outpatient Follow-up:  1. Follow up with PCP in 1-2 weeks 2. [place] reviewed. No recent controlled substances are listed. Limited quantity of narcotic prescribed for acute fracture  Discharge Condition:Stable CODE STATUS:Full Diet recommendation: Regular   Brief/Interim Summary: 86 y.o.femalewith medical history significant ofHTN,lumbar spine stenosis s/p lumbar spinal fusion, bilateral hip OA status post bilateral total hip replacement, presented with left-sided pelvic pain after a fall. Last night, patient was in the bathroom trying to reach her urinal on the floor, then tripped and fell down landed on the left sided buttock. Immediately feeling extremely pain on the left hip.She was able to get up and walk last night. But this morning pain became extreme.She denied any other joint or muscle pain, no loss of consciousness no chest pains orshort of breath.  ED Course:X-ray showed: fracture left ischium with mild impaction at the fracture site. Total hip replacements bilaterally with prosthetic components well-seated. No dislocation. Osteoporosis. EDgave patient pain meds and walk the patient around. Initially patient was doing okay, but when ED try to discharge patient and put the patient in her car, patient complained pain was unbearable and she could not even sit in the car.  Discharge Diagnoses:  Active Problems:   Pelvic fracture (HCC)   Osteoporosis  Pelvic fracture -Fracture appears stable -Imaging reviewed. Prior surgical hip hardware in place and intact -PT/OT consulted with initial recs for CIR vs SNF -As pt's husband will need placement as well, plan to discharge to SNF -Continue with analgesia as tolerated -COVID test for placement  is negative  Osteoporosis -Vitamin D level low at 11.47 -Pt continued on vitamin D and calcium supplement -Recommend outpatient PCP follow-up for bone density study, would also benefit from bisphosphonate as outpatient  Hypertension -Currently stable and controlled -continue home regimen as pt tolerates  Discharge Instructions   Allergies as of 09/06/2019      Reactions   Lipitor [atorvastatin]    Dizzy ( fuzziness)   Latex Rash      Medication List    STOP taking these medications   pantoprazole 40 MG tablet Commonly known as: PROTONIX   tiZANidine 2 MG tablet Commonly known as: ZANAFLEX     TAKE these medications   acetaminophen 500 MG tablet Commonly known as: TYLENOL Take 500 mg by mouth as needed for mild pain.   bisacodyl 5 MG EC tablet Commonly known as: DULCOLAX Take 1 tablet (5 mg total) by mouth daily as needed for moderate constipation.   calcium carbonate 1250 (500 Ca) MG tablet Commonly known as: OS-CAL - dosed in mg of elemental calcium Take 1 tablet (500 mg of elemental calcium total) by mouth 2 (two) times daily with a meal.   furosemide 20 MG tablet Commonly known as: LASIX TAKE 2 TABLETS BY MOUTH EVERY DAY What changed: how much to take   nadolol 40 MG tablet Commonly known as: CORGARD TAKE 1 TABLET BY MOUTH EVERY DAY   polyethylene glycol 17 g packet Commonly known as: MIRALAX / GLYCOLAX Take 17 g by mouth daily. Start taking on: Sep 07, 2019   senna-docusate 8.6-50 MG tablet Commonly known as: Senokot-S Take 1 tablet by mouth at bedtime as needed for mild constipation.   traMADol 50 MG tablet Commonly known as: ULTRAM Take 1 tablet (50 mg total)  by mouth every 6 (six) hours as needed.   Vitamin D (Ergocalciferol) 1.25 MG (50000 UNIT) Caps capsule Commonly known as: DRISDOL Take 1 capsule (50,000 Units total) by mouth every 7 (seven) days. Start taking on: Sep 09, 2019       Contact information for follow-up providers     Schedule an appointment as soon as possible for a visit  with Lisette Abu, PA-C.   Specialty: Orthopedic Surgery Contact information: [place] [address] [phone]        Schedule an appointment as soon as possible for a visit  with Venia Carbon, MD.   Specialties: Internal Medicine, Pediatrics Contact information: [place] [address] [phone]            Contact information for after-discharge care    Destination    [facility] Preferred SNF .   Service: Skilled Nursing Contact information: [place] [phone]                 Allergies  Allergen Reactions  . Lipitor [Atorvastatin]     Dizzy ( fuzziness)  . Latex Rash    Procedures/Studies: DG Lumbar Spine Complete  Result Date: 09/02/2019 CLINICAL DATA:  Fall. Pain. EXAM: LUMBAR SPINE - COMPLETE 4+ VIEW COMPARISON:  11/26/2017 FINDINGS: Similar to previous exam, lowest lumbar type vertebral body labeled L5. Stable appearance of posterior fusion from L1-L4 with interbody fusion device at L1-2. Hardware appears intact. Stable 13 millimeters anterolisthesis of L5 on S1. Significant facet hypertrophy and facet fusion. Significant osteophytes in the LOWER thoracic spine. No acute fracture. Status post bilateral hip arthroplasty. IMPRESSION: 1. Stable appearance of posterior fusion from L1-L4. 2. Stable anterolisthesis of L5 on S1. Electronically Signed   By: Nolon Nations M.D.   On: 09/02/2019 08:39   DG Hip Unilat With Pelvis 2-3 Views Left  Result Date: 09/02/2019 CLINICAL DATA:  Pain following fall EXAM: DG HIP (WITH OR WITHOUT PELVIS) 2-3V LEFT COMPARISON:  December 27, 2015 FINDINGS: Frontal pelvis as well as frontal and lateral left hip images were obtained. There are total hip replacements bilaterally with prosthetic components appearing well-seated. There is a fracture of the mid  ischium on the left with slight impaction at the fracture site. No other fracture is appreciable. No dislocation. Bones are osteoporotic. There is postoperative change in the visualized lower lumbar spine. IMPRESSION: Fracture left ischium with mild impaction at the fracture site. Total hip replacements bilaterally with prosthetic components well-seated. No dislocation. Osteoporosis. Electronically Signed   By: Lowella Grip III M.D.   On: 09/02/2019 07:43    Subjective: Eager to start rehab. Feeling concerned about her husband and his SNF placement  Discharge Exam: Vitals:   09/05/19 2142 09/06/19 0513  BP: 100/78 (!) 124/57  Pulse: 72 (!) 59  Resp: 17 15  Temp: 98.2 F (36.8 C) 98.4 F (36.9 C)  SpO2: 98% 99%   Vitals:   09/05/19 0414 09/05/19 1405 09/05/19 2142 09/06/19 0513  BP: (!) 141/56 96/80 100/78 (!) 124/57  Pulse: 62 68 72 (!) 59  Resp: 16 19 17 15   Temp: 98.1 F (36.7 C) 98 F (36.7 C) 98.2 F (36.8 C) 98.4 F (36.9 C)  TempSrc: Oral Oral Oral Oral  SpO2: 98% 97% 98% 99%  Weight:      Height:        General: Pt is alert, awake, not in acute distress Cardiovascular: RRR,  S1/S2 +, no rubs, no gallops Respiratory: CTA bilaterally, no wheezing, no rhonchi Abdominal: Soft, NT, ND, bowel sounds + Extremities: no edema, no cyanosis   The results of significant diagnostics from this hospitalization (including imaging, microbiology, ancillary and laboratory) are listed below for reference.     Microbiology: Recent Results (from the past 240 hour(s))  SARS Coronavirus 2 by RT PCR (hospital order, performed in [facility] hospital lab) Nasopharyngeal Nasopharyngeal Swab     Status: None   Collection Time: 09/02/19  1:54 PM   Specimen: Nasopharyngeal Swab  Result Value Ref Range Status   SARS Coronavirus 2 NEGATIVE NEGATIVE Final    Comment: (NOTE) SARS-CoV-2 target nucleic acids are NOT DETECTED. The SARS-CoV-2 RNA is generally detectable in upper and  lower respiratory specimens during the acute phase of infection. The lowest concentration of SARS-CoV-2 viral copies this assay can detect is 250 copies / mL. A negative result does not preclude SARS-CoV-2 infection and should not be used as the sole basis for treatment or other patient management decisions.  A negative result may occur with improper specimen collection / handling, submission of specimen other than nasopharyngeal swab, presence of viral mutation(s) within the areas targeted by this assay, and inadequate number of viral copies (<250 copies / mL). A negative result must be combined with clinical observations, patient history, and epidemiological information. Fact Sheet for Patients:   StrictlyIdeas.no Fact Sheet for Healthcare Providers: BankingDealers.co.za This test is not yet approved or cleared  by the [place] FDA and has been authorized for detection and/or diagnosis of SARS-CoV-2 by FDA under an Emergency Use Authorization (EUA).  This EUA will remain in effect (meaning this test can be used) for the duration of the COVID-19 declaration under Section 564(b)(1) of the Act, 21 U.S.C. section 360bbb-3(b)(1), unless the authorization is terminated or revoked sooner. Performed at [facility], [place] [address] [place], [place] [address]   SARS CORONAVIRUS 2 (TAT 6-24 HRS) Nasopharyngeal Nasopharyngeal Swab     Status: None   Collection Time: 09/05/19  4:11 PM   Specimen: Nasopharyngeal Swab  Result Value Ref Range Status   SARS Coronavirus 2 NEGATIVE NEGATIVE Final    Comment: (NOTE) SARS-CoV-2 target nucleic acids are NOT DETECTED. The SARS-CoV-2 RNA is generally detectable in upper and lower respiratory specimens during the acute phase of infection. Negative results do not preclude SARS-CoV-2 infection, do not rule out co-infections with other pathogens, and should not be used as the sole basis for  treatment or other patient management decisions. Negative results must be combined with clinical observations, patient history, and epidemiological information. The expected result is Negative. Fact Sheet for Patients: SugarRoll.be Fact Sheet for Healthcare Providers: https://www.woods-mathews.com/ This test is not yet approved or cleared by the [place] FDA and  has been authorized for detection and/or diagnosis of SARS-CoV-2 by FDA under an Emergency Use Authorization (EUA). This EUA will remain  in effect (meaning this test can be used) for the duration of the COVID-19 declaration under Section 56 4(b)(1) of the Act, 21 U.S.C. section 360bbb-3(b)(1), unless the authorization is terminated or revoked sooner. Performed at [facility], [place] [address] [place], [place] [address]      Labs: BNP (last 3 results) No results for input(s): BNP in the last 8760 hours. Basic Metabolic Panel: Recent Labs  Lab 09/02/19 1305  NA 141  K 3.7  CL 104  CO2 25  GLUCOSE 133*  BUN 17  CREATININE 1.01*  CALCIUM 9.8   Liver Function Tests: No results for input(s): AST, ALT, ALKPHOS, BILITOT, PROT, ALBUMIN in the last 168 hours. No results for input(s): LIPASE, AMYLASE in the last 168 hours. No results for input(s): AMMONIA in the last 168 hours. CBC: Recent Labs  Lab 09/02/19 1305  WBC 8.4  NEUTROABS 6.3  HGB 15.4*  HCT 47.9*  MCV 96.6  PLT 207   Cardiac Enzymes: No results for input(s): CKTOTAL, CKMB, CKMBINDEX, TROPONINI in the last 168 hours. BNP: Invalid input(s): POCBNP CBG: No results for input(s): GLUCAP in the last 168 hours. D-Dimer No results for input(s): DDIMER in the last 72 hours. Hgb A1c No results for input(s): HGBA1C in the last 72 hours. Lipid Profile No results for input(s): CHOL, HDL, LDLCALC, TRIG, CHOLHDL, LDLDIRECT in the last 72 hours. Thyroid function studies No results for input(s): TSH, T4TOTAL,  T3FREE, THYROIDAB in the last 72 hours.  Invalid input(s): FREET3 Anemia work up No results for input(s): VITAMINB12, FOLATE, FERRITIN, TIBC, IRON, RETICCTPCT in the last 72 hours. Urinalysis    Component Value Date/Time   BILIRUBINUR negative 02/26/2016 0937   PROTEINUR negative 02/26/2016 0937   UROBILINOGEN negative 02/26/2016 0937   NITRITE negative 02/26/2016 0937   LEUKOCYTESUR Trace (A) 02/26/2016 0937   Sepsis Labs Invalid input(s): PROCALCITONIN,  WBC,  LACTICIDVEN Microbiology Recent Results (from the past 240 hour(s))  SARS Coronavirus 2 by RT PCR (hospital order, performed in [place] hospital lab) Nasopharyngeal Nasopharyngeal Swab     Status: None   Collection Time: 09/02/19  1:54 PM   Specimen: Nasopharyngeal Swab  Result Value Ref Range Status   SARS Coronavirus 2 NEGATIVE NEGATIVE Final    Comment: (NOTE) SARS-CoV-2 target nucleic acids are NOT DETECTED. The SARS-CoV-2 RNA is generally detectable in upper and lower respiratory specimens during the acute phase of infection. The lowest concentration of SARS-CoV-2 viral copies this assay can detect is 250 copies / mL. A negative result does not preclude SARS-CoV-2 infection and should not be used as the sole basis for treatment or other patient management decisions.  A negative result may occur with improper specimen collection / handling, submission of specimen other than nasopharyngeal swab, presence of viral mutation(s) within the areas targeted by this assay, and inadequate number of viral copies (<250 copies / mL). A negative result must be combined with clinical observations, patient history, and epidemiological information. Fact Sheet for Patients:   StrictlyIdeas.no Fact Sheet for Healthcare Providers: BankingDealers.co.za This test is not yet approved or cleared  by the [place] FDA and has been authorized for detection and/or diagnosis of SARS-CoV-2  by FDA under an Emergency Use Authorization (EUA).  This EUA will remain in effect (meaning this test can be used) for the duration of the COVID-19 declaration under Section 564(b)(1) of the Act, 21 U.S.C. section 360bbb-3(b)(1), unless the authorization is terminated or revoked sooner. Performed at [facility], [place] [address], [place], [place] [address]   SARS CORONAVIRUS 2 (TAT 6-24 HRS) Nasopharyngeal Nasopharyngeal Swab     Status: None   Collection Time: 09/05/19  4:11 PM   Specimen: Nasopharyngeal Swab  Result Value Ref Range Status   SARS Coronavirus 2 NEGATIVE NEGATIVE Final    Comment: (NOTE) SARS-CoV-2 target nucleic acids are NOT DETECTED. The SARS-CoV-2 RNA is generally detectable in upper and lower respiratory specimens during the acute phase of infection. Negative results do not preclude SARS-CoV-2 infection, do not rule out co-infections with other pathogens, and should not  be used as the sole basis for treatment or other patient management decisions. Negative results must be combined with clinical observations, patient history, and epidemiological information. The expected result is Negative. Fact Sheet for Patients: SugarRoll.be Fact Sheet for Healthcare Providers: https://www.woods-mathews.com/ This test is not yet approved or cleared by the [place] FDA and  has been authorized for detection and/or diagnosis of SARS-CoV-2 by FDA under an Emergency Use Authorization (EUA). This EUA will remain  in effect (meaning this test can be used) for the duration of the COVID-19 declaration under Section 56 4(b)(1) of the Act, 21 U.S.C. section 360bbb-3(b)(1), unless the authorization is terminated or revoked sooner. Performed at [facility], [place] [address], [place], [place] [address]    Time spent: 30 min  SIGNED:   Marylu Lund, MD  [facility] 09/06/2019, 10:58 AM  If 7PM-7AM, please contact  night-coverage

## 2019-09-07 DIAGNOSIS — M5136 Other intervertebral disc degeneration, lumbar region: Secondary | ICD-10-CM | POA: Diagnosis not present

## 2019-09-07 DIAGNOSIS — M48061 Spinal stenosis, lumbar region without neurogenic claudication: Secondary | ICD-10-CM | POA: Diagnosis not present

## 2019-09-07 DIAGNOSIS — M353 Polymyalgia rheumatica: Secondary | ICD-10-CM | POA: Diagnosis not present

## 2019-09-07 DIAGNOSIS — M810 Age-related osteoporosis without current pathological fracture: Secondary | ICD-10-CM | POA: Diagnosis not present

## 2019-09-07 DIAGNOSIS — S32601D Unspecified fracture of right ischium, subsequent encounter for fracture with routine healing: Secondary | ICD-10-CM | POA: Diagnosis not present

## 2019-09-07 DIAGNOSIS — E782 Mixed hyperlipidemia: Secondary | ICD-10-CM | POA: Diagnosis not present

## 2019-09-07 DIAGNOSIS — I10 Essential (primary) hypertension: Secondary | ICD-10-CM | POA: Diagnosis not present

## 2019-09-07 DIAGNOSIS — I723 Aneurysm of iliac artery: Secondary | ICD-10-CM | POA: Diagnosis not present

## 2019-09-07 DIAGNOSIS — E669 Obesity, unspecified: Secondary | ICD-10-CM | POA: Diagnosis not present

## 2019-09-09 DIAGNOSIS — M549 Dorsalgia, unspecified: Secondary | ICD-10-CM | POA: Diagnosis not present

## 2019-09-09 DIAGNOSIS — E782 Mixed hyperlipidemia: Secondary | ICD-10-CM | POA: Diagnosis not present

## 2019-09-09 DIAGNOSIS — R269 Unspecified abnormalities of gait and mobility: Secondary | ICD-10-CM | POA: Diagnosis not present

## 2019-09-09 DIAGNOSIS — G20 Parkinson's disease: Secondary | ICD-10-CM | POA: Diagnosis not present

## 2019-09-09 DIAGNOSIS — F419 Anxiety disorder, unspecified: Secondary | ICD-10-CM | POA: Diagnosis not present

## 2019-09-09 DIAGNOSIS — S32601D Unspecified fracture of right ischium, subsequent encounter for fracture with routine healing: Secondary | ICD-10-CM | POA: Diagnosis not present

## 2019-09-09 DIAGNOSIS — S329XXA Fracture of unspecified parts of lumbosacral spine and pelvis, initial encounter for closed fracture: Secondary | ICD-10-CM | POA: Diagnosis not present

## 2019-09-09 DIAGNOSIS — F319 Bipolar disorder, unspecified: Secondary | ICD-10-CM | POA: Diagnosis not present

## 2019-09-09 DIAGNOSIS — I10 Essential (primary) hypertension: Secondary | ICD-10-CM | POA: Diagnosis not present

## 2019-09-14 ENCOUNTER — Encounter: Payer: Medicare Other | Admitting: Internal Medicine

## 2019-09-14 ENCOUNTER — Other Ambulatory Visit: Payer: Self-pay | Admitting: Internal Medicine

## 2019-09-19 DIAGNOSIS — S32810A Multiple fractures of pelvis with stable disruption of pelvic ring, initial encounter for closed fracture: Secondary | ICD-10-CM | POA: Diagnosis not present

## 2019-09-24 DIAGNOSIS — Z9181 History of falling: Secondary | ICD-10-CM | POA: Diagnosis not present

## 2019-09-24 DIAGNOSIS — F319 Bipolar disorder, unspecified: Secondary | ICD-10-CM | POA: Diagnosis not present

## 2019-09-24 DIAGNOSIS — Z741 Need for assistance with personal care: Secondary | ICD-10-CM | POA: Diagnosis not present

## 2019-09-24 DIAGNOSIS — M810 Age-related osteoporosis without current pathological fracture: Secondary | ICD-10-CM | POA: Diagnosis not present

## 2019-09-24 DIAGNOSIS — I10 Essential (primary) hypertension: Secondary | ICD-10-CM | POA: Diagnosis not present

## 2019-09-24 DIAGNOSIS — G20 Parkinson's disease: Secondary | ICD-10-CM | POA: Diagnosis not present

## 2019-09-24 DIAGNOSIS — S32602D Unspecified fracture of left ischium, subsequent encounter for fracture with routine healing: Secondary | ICD-10-CM | POA: Diagnosis not present

## 2019-09-24 DIAGNOSIS — J449 Chronic obstructive pulmonary disease, unspecified: Secondary | ICD-10-CM | POA: Diagnosis not present

## 2019-09-26 ENCOUNTER — Telehealth: Payer: Self-pay | Admitting: *Deleted

## 2019-09-26 ENCOUNTER — Telehealth: Payer: Self-pay | Admitting: Internal Medicine

## 2019-09-26 DIAGNOSIS — S32602D Unspecified fracture of left ischium, subsequent encounter for fracture with routine healing: Secondary | ICD-10-CM | POA: Diagnosis not present

## 2019-09-26 DIAGNOSIS — Z741 Need for assistance with personal care: Secondary | ICD-10-CM | POA: Diagnosis not present

## 2019-09-26 DIAGNOSIS — F319 Bipolar disorder, unspecified: Secondary | ICD-10-CM | POA: Diagnosis not present

## 2019-09-26 DIAGNOSIS — I10 Essential (primary) hypertension: Secondary | ICD-10-CM | POA: Diagnosis not present

## 2019-09-26 DIAGNOSIS — G20 Parkinson's disease: Secondary | ICD-10-CM | POA: Diagnosis not present

## 2019-09-26 DIAGNOSIS — Z9181 History of falling: Secondary | ICD-10-CM | POA: Diagnosis not present

## 2019-09-26 DIAGNOSIS — J449 Chronic obstructive pulmonary disease, unspecified: Secondary | ICD-10-CM | POA: Diagnosis not present

## 2019-09-26 DIAGNOSIS — M810 Age-related osteoporosis without current pathological fracture: Secondary | ICD-10-CM | POA: Diagnosis not present

## 2019-09-26 NOTE — Telephone Encounter (Signed)
That is fine FYI---I will be seeing her on Wednesday afternoon

## 2019-09-26 NOTE — Telephone Encounter (Signed)
Patient called today asking to speak with you. She would like a call from you if possible  She stated that both her husband and her are going to be using [facility] and they will need an approval by you. She stated she could go into further detail when she spoke with you. Did not leave more information   Ph- [phone]

## 2019-09-26 NOTE — Telephone Encounter (Signed)
Will OT with [facility] left a voicemail stating that he has finished the OT evaluation. Will stated that he is requesting verbal orders to start next week for twice a week for two weeks. Will stated when calling back it is okay to leave orders on his secure voicemail.

## 2019-09-26 NOTE — Telephone Encounter (Signed)
Verbal orders given to Will.  

## 2019-09-26 NOTE — Telephone Encounter (Signed)
She is now back home for 2 days. I will plan to see her on Wednesday Not sure her husband will be back yet (may come home on Wednesday)

## 2019-09-27 DIAGNOSIS — I10 Essential (primary) hypertension: Secondary | ICD-10-CM | POA: Diagnosis not present

## 2019-09-27 DIAGNOSIS — Z9181 History of falling: Secondary | ICD-10-CM | POA: Diagnosis not present

## 2019-09-27 DIAGNOSIS — M810 Age-related osteoporosis without current pathological fracture: Secondary | ICD-10-CM | POA: Diagnosis not present

## 2019-09-27 DIAGNOSIS — F319 Bipolar disorder, unspecified: Secondary | ICD-10-CM | POA: Diagnosis not present

## 2019-09-27 DIAGNOSIS — Z741 Need for assistance with personal care: Secondary | ICD-10-CM | POA: Diagnosis not present

## 2019-09-27 DIAGNOSIS — J449 Chronic obstructive pulmonary disease, unspecified: Secondary | ICD-10-CM | POA: Diagnosis not present

## 2019-09-27 DIAGNOSIS — S32602D Unspecified fracture of left ischium, subsequent encounter for fracture with routine healing: Secondary | ICD-10-CM | POA: Diagnosis not present

## 2019-09-27 DIAGNOSIS — G20 Parkinson's disease: Secondary | ICD-10-CM | POA: Diagnosis not present

## 2019-09-28 ENCOUNTER — Encounter: Payer: Self-pay | Admitting: Internal Medicine

## 2019-09-28 ENCOUNTER — Other Ambulatory Visit: Payer: Self-pay

## 2019-09-28 ENCOUNTER — Ambulatory Visit: Payer: Medicare PPO | Admitting: Internal Medicine

## 2019-09-28 VITALS — BP 122/68 | HR 54 | Resp 12 | Wt 168.0 lb

## 2019-09-28 DIAGNOSIS — S32602D Unspecified fracture of left ischium, subsequent encounter for fracture with routine healing: Secondary | ICD-10-CM | POA: Diagnosis not present

## 2019-09-28 DIAGNOSIS — I723 Aneurysm of iliac artery: Secondary | ICD-10-CM

## 2019-09-28 DIAGNOSIS — I10 Essential (primary) hypertension: Secondary | ICD-10-CM | POA: Diagnosis not present

## 2019-09-28 DIAGNOSIS — M353 Polymyalgia rheumatica: Secondary | ICD-10-CM | POA: Diagnosis not present

## 2019-09-28 DIAGNOSIS — K219 Gastro-esophageal reflux disease without esophagitis: Secondary | ICD-10-CM | POA: Diagnosis not present

## 2019-09-28 MED ORDER — RISEDRONATE SODIUM 150 MG PO TABS: 150.0000 mg | ORAL_TABLET | ORAL | 3 refills | Status: DC

## 2019-09-28 MED ORDER — VITAMIN D (ERGOCALCIFEROL) 1.25 MG (50000 UNIT) PO CAPS: 50000.0000 [IU] | ORAL_CAPSULE | ORAL | 0 refills | Status: DC

## 2019-09-28 NOTE — Assessment & Plan Note (Signed)
Has been quiet No meds

## 2019-09-28 NOTE — Progress Notes (Signed)
Subjective:    Patient ID: Sydney [name], female    DOB: January 18, 1933, 86 y.o.   MRN: [id number]  HPI Home visit for follow up after hospitalization and rehab for pelvic fracture Her dependent husband is also back home with her  [address] 5/21---pelvic fracture Required brief hospitalization due to pain and couldn't walk Went to [facility] for rehab Home 6/11  Only mild pain---episodes of 2/10 pain when she is doing things Walking with 3 wheeled rollator Stand by help with shower Dressing/bathroom independently Sons are shopping and preparing meals Granddaughter takes care of the housework---now out of commission after copperhead bites  Started on calcium and vitamin D Rarely using the tramadol now  Has yearly follow up for iliac artery aneurysm Still on ASA Will have to reschedule follow up  No chest pain or SOB No dizziness or syncope No palpitations No   No muscle aching Feels the PMR is still quiet  Current Outpatient Medications on File Prior to Visit  Medication Sig Dispense Refill  . aspirin EC 81 MG tablet Take 81 mg by mouth daily. Swallow whole.    Marland Kitchen acetaminophen (TYLENOL) 500 MG tablet Take 500 mg by mouth as needed for mild pain.     . calcium carbonate (OS-CAL - DOSED IN MG OF ELEMENTAL CALCIUM) 1250 (500 Ca) MG tablet Take 1 tablet (500 mg of elemental calcium total) by mouth 2 (two) times daily with a meal. 60 tablet 0  . furosemide (LASIX) 20 MG tablet TAKE 2 TABLETS BY MOUTH EVERY DAY (Patient taking differently: Take 20 mg by mouth daily. ) 180 tablet 3  . nadolol (CORGARD) 40 MG tablet TAKE 1 TABLET BY MOUTH EVERY DAY (Patient taking differently: Take 40 mg by mouth daily. ) 90 tablet 3  . traMADol (ULTRAM) 50 MG tablet Take 1 tablet (50 mg total) by mouth every 6 (six) hours as needed. 15 tablet 0  . Vitamin D, Ergocalciferol, (DRISDOL) 1.25 MG (50000 UNIT) CAPS capsule Take 1 capsule (50,000 Units total) by mouth every 7 (seven) days. 30 capsule 0   No  current facility-administered medications on file prior to visit.    Allergies  Allergen Reactions  . Lipitor [Atorvastatin]     Dizzy ( fuzziness)  . Latex Rash    Past Medical History:  Diagnosis Date  . GERD (gastroesophageal reflux disease)   . Hx of lumbosacral spine surgery t  . Hyperlipidemia   . Hypertension   . Iliac artery aneurysm ([place])   . Lumbar spinal stenosis   . Polymyalgia rheumatica ([place]) 06/2016    Past Surgical History:  Procedure Laterality Date  . JOINT REPLACEMENT  1992   right hip replacement  . JOINT REPLACEMENT  2002   left hip replacement  . ROTATOR CUFF REPAIR  2002   Left shoulder  . SPINE SURGERY  2006 & 2010   posterior spinal fusion L4-5 L5S1    Family History  Problem Relation Age of Onset  . Heart disease Mother   . Heart disease Father        Heart Disease before age 53  . Heart attack Father   . Hyperlipidemia Father   . Heart disease Sister   . Diabetes Sister   . Heart disease Brother   . Hyperlipidemia Brother   . Heart disease Brother   . Cancer Brother     Social History   Socioeconomic History  . Marital status: Married    Spouse name: Not on file  .  Number of children: Not on file  . Years of education: Not on file  . Highest education level: Not on file  Occupational History  . Not on file  Tobacco Use  . Smoking status: Never Smoker  . Smokeless tobacco: Never Used  Substance and Sexual Activity  . Alcohol use: No    Alcohol/week: 0.0 standard drinks  . Drug use: No  . Sexual activity: Not on file  Other Topics Concern  . Not on file  Social History Narrative   Has living will   Husband is health care POA--- alternate is daughter Sydney [name] (then son Sydney [name])   Discussed DNR--she requests (done 11/30/12)   No tube feedings if cognitively unaware   Social Determinants of Health   Financial Resource Strain:   . Difficulty of Paying Living Expenses:   Food Insecurity:   . Worried About Sales executive in the Last Year:   . Arboriculturist in the Last Year:   Transportation Needs:   . Film/video editor (Medical):   Marland Kitchen Lack of Transportation (Non-Medical):   Physical Activity:   . Days of Exercise per Week:   . Minutes of Exercise per Session:   Stress:   . Feeling of Stress :   Social Connections:   . Frequency of Communication with Friends and Family:   . Frequency of Social Gatherings with Friends and Family:   . Attends Religious Services:   . Active Member of Clubs or Organizations:   . Attends Archivist Meetings:   Marland Kitchen Marital Status:   Intimate Partner Violence:   . Fear of Current or Ex-Partner:   . Emotionally Abused:   Marland Kitchen Physically Abused:   . Sexually Abused:    Review of Systems Occasional headache--tylenol relieves Appetite is down--not as active Has lost 10# with this event Sleeping okay Was on laxatives while in hospital --now regular again without help    Objective:   Physical Exam  Constitutional: No distress.  Cardiovascular: Regular rhythm. Bradycardia present. Exam reveals no gallop.  No murmur heard. Respiratory: Effort normal and breath sounds normal. She has no wheezes. She has no rales.  GI: Soft. Normal appearance. There is no abdominal tenderness.  Musculoskeletal:     Cervical back: Neck supple.     Comments: Fairly normal passive ROM in hips--no pain No back tenderness  Lymphadenopathy:    She has no cervical adenopathy.  Neurological:  Easily gets up from seat and walks well with rollator           Assessment & Plan:

## 2019-09-28 NOTE — Assessment & Plan Note (Signed)
Still seems to be in remission  Will recheck sed rate at her next office visit

## 2019-09-28 NOTE — Assessment & Plan Note (Signed)
Takes ASA daily for this Will reschedule the missed yearly follow up

## 2019-09-28 NOTE — Assessment & Plan Note (Signed)
Good control on the nadolol

## 2019-09-28 NOTE — Assessment & Plan Note (Signed)
Finished with in patient rehab---now PT/OT at home Getting help from family for instrumental ADLs Will start actonel

## 2019-09-30 DIAGNOSIS — M810 Age-related osteoporosis without current pathological fracture: Secondary | ICD-10-CM | POA: Diagnosis not present

## 2019-09-30 DIAGNOSIS — G20 Parkinson's disease: Secondary | ICD-10-CM | POA: Diagnosis not present

## 2019-09-30 DIAGNOSIS — Z9181 History of falling: Secondary | ICD-10-CM | POA: Diagnosis not present

## 2019-09-30 DIAGNOSIS — S32602D Unspecified fracture of left ischium, subsequent encounter for fracture with routine healing: Secondary | ICD-10-CM | POA: Diagnosis not present

## 2019-09-30 DIAGNOSIS — J449 Chronic obstructive pulmonary disease, unspecified: Secondary | ICD-10-CM | POA: Diagnosis not present

## 2019-09-30 DIAGNOSIS — F319 Bipolar disorder, unspecified: Secondary | ICD-10-CM | POA: Diagnosis not present

## 2019-09-30 DIAGNOSIS — I10 Essential (primary) hypertension: Secondary | ICD-10-CM | POA: Diagnosis not present

## 2019-09-30 DIAGNOSIS — Z741 Need for assistance with personal care: Secondary | ICD-10-CM | POA: Diagnosis not present

## 2019-10-03 DIAGNOSIS — F319 Bipolar disorder, unspecified: Secondary | ICD-10-CM | POA: Diagnosis not present

## 2019-10-03 DIAGNOSIS — M810 Age-related osteoporosis without current pathological fracture: Secondary | ICD-10-CM | POA: Diagnosis not present

## 2019-10-03 DIAGNOSIS — J449 Chronic obstructive pulmonary disease, unspecified: Secondary | ICD-10-CM | POA: Diagnosis not present

## 2019-10-03 DIAGNOSIS — G20 Parkinson's disease: Secondary | ICD-10-CM | POA: Diagnosis not present

## 2019-10-03 DIAGNOSIS — S32602D Unspecified fracture of left ischium, subsequent encounter for fracture with routine healing: Secondary | ICD-10-CM | POA: Diagnosis not present

## 2019-10-03 DIAGNOSIS — Z9181 History of falling: Secondary | ICD-10-CM | POA: Diagnosis not present

## 2019-10-03 DIAGNOSIS — I10 Essential (primary) hypertension: Secondary | ICD-10-CM | POA: Diagnosis not present

## 2019-10-03 DIAGNOSIS — Z741 Need for assistance with personal care: Secondary | ICD-10-CM | POA: Diagnosis not present

## 2019-10-04 DIAGNOSIS — Z741 Need for assistance with personal care: Secondary | ICD-10-CM | POA: Diagnosis not present

## 2019-10-04 DIAGNOSIS — G20 Parkinson's disease: Secondary | ICD-10-CM | POA: Diagnosis not present

## 2019-10-04 DIAGNOSIS — J449 Chronic obstructive pulmonary disease, unspecified: Secondary | ICD-10-CM | POA: Diagnosis not present

## 2019-10-04 DIAGNOSIS — Z9181 History of falling: Secondary | ICD-10-CM | POA: Diagnosis not present

## 2019-10-04 DIAGNOSIS — I10 Essential (primary) hypertension: Secondary | ICD-10-CM | POA: Diagnosis not present

## 2019-10-04 DIAGNOSIS — F319 Bipolar disorder, unspecified: Secondary | ICD-10-CM | POA: Diagnosis not present

## 2019-10-04 DIAGNOSIS — M810 Age-related osteoporosis without current pathological fracture: Secondary | ICD-10-CM | POA: Diagnosis not present

## 2019-10-04 DIAGNOSIS — S32602D Unspecified fracture of left ischium, subsequent encounter for fracture with routine healing: Secondary | ICD-10-CM | POA: Diagnosis not present

## 2019-10-05 DIAGNOSIS — I10 Essential (primary) hypertension: Secondary | ICD-10-CM | POA: Diagnosis not present

## 2019-10-05 DIAGNOSIS — S32602D Unspecified fracture of left ischium, subsequent encounter for fracture with routine healing: Secondary | ICD-10-CM | POA: Diagnosis not present

## 2019-10-05 DIAGNOSIS — Z741 Need for assistance with personal care: Secondary | ICD-10-CM | POA: Diagnosis not present

## 2019-10-05 DIAGNOSIS — F319 Bipolar disorder, unspecified: Secondary | ICD-10-CM | POA: Diagnosis not present

## 2019-10-05 DIAGNOSIS — Z9181 History of falling: Secondary | ICD-10-CM | POA: Diagnosis not present

## 2019-10-05 DIAGNOSIS — M810 Age-related osteoporosis without current pathological fracture: Secondary | ICD-10-CM | POA: Diagnosis not present

## 2019-10-05 DIAGNOSIS — G20 Parkinson's disease: Secondary | ICD-10-CM | POA: Diagnosis not present

## 2019-10-05 DIAGNOSIS — J449 Chronic obstructive pulmonary disease, unspecified: Secondary | ICD-10-CM | POA: Diagnosis not present

## 2019-10-06 DIAGNOSIS — J449 Chronic obstructive pulmonary disease, unspecified: Secondary | ICD-10-CM | POA: Diagnosis not present

## 2019-10-06 DIAGNOSIS — F319 Bipolar disorder, unspecified: Secondary | ICD-10-CM | POA: Diagnosis not present

## 2019-10-06 DIAGNOSIS — Z9181 History of falling: Secondary | ICD-10-CM | POA: Diagnosis not present

## 2019-10-06 DIAGNOSIS — Z741 Need for assistance with personal care: Secondary | ICD-10-CM | POA: Diagnosis not present

## 2019-10-06 DIAGNOSIS — G20 Parkinson's disease: Secondary | ICD-10-CM | POA: Diagnosis not present

## 2019-10-06 DIAGNOSIS — I10 Essential (primary) hypertension: Secondary | ICD-10-CM | POA: Diagnosis not present

## 2019-10-06 DIAGNOSIS — M810 Age-related osteoporosis without current pathological fracture: Secondary | ICD-10-CM | POA: Diagnosis not present

## 2019-10-06 DIAGNOSIS — S32602D Unspecified fracture of left ischium, subsequent encounter for fracture with routine healing: Secondary | ICD-10-CM | POA: Diagnosis not present

## 2019-10-08 DIAGNOSIS — Z9181 History of falling: Secondary | ICD-10-CM | POA: Diagnosis not present

## 2019-10-08 DIAGNOSIS — G20 Parkinson's disease: Secondary | ICD-10-CM | POA: Diagnosis not present

## 2019-10-08 DIAGNOSIS — I10 Essential (primary) hypertension: Secondary | ICD-10-CM | POA: Diagnosis not present

## 2019-10-08 DIAGNOSIS — S32602D Unspecified fracture of left ischium, subsequent encounter for fracture with routine healing: Secondary | ICD-10-CM | POA: Diagnosis not present

## 2019-10-08 DIAGNOSIS — J449 Chronic obstructive pulmonary disease, unspecified: Secondary | ICD-10-CM | POA: Diagnosis not present

## 2019-10-08 DIAGNOSIS — M810 Age-related osteoporosis without current pathological fracture: Secondary | ICD-10-CM | POA: Diagnosis not present

## 2019-10-08 DIAGNOSIS — F319 Bipolar disorder, unspecified: Secondary | ICD-10-CM | POA: Diagnosis not present

## 2019-10-08 DIAGNOSIS — Z741 Need for assistance with personal care: Secondary | ICD-10-CM | POA: Diagnosis not present

## 2019-10-10 DIAGNOSIS — S32602D Unspecified fracture of left ischium, subsequent encounter for fracture with routine healing: Secondary | ICD-10-CM | POA: Diagnosis not present

## 2019-10-10 DIAGNOSIS — F319 Bipolar disorder, unspecified: Secondary | ICD-10-CM | POA: Diagnosis not present

## 2019-10-10 DIAGNOSIS — Z741 Need for assistance with personal care: Secondary | ICD-10-CM | POA: Diagnosis not present

## 2019-10-10 DIAGNOSIS — J449 Chronic obstructive pulmonary disease, unspecified: Secondary | ICD-10-CM | POA: Diagnosis not present

## 2019-10-10 DIAGNOSIS — Z9181 History of falling: Secondary | ICD-10-CM | POA: Diagnosis not present

## 2019-10-10 DIAGNOSIS — M810 Age-related osteoporosis without current pathological fracture: Secondary | ICD-10-CM | POA: Diagnosis not present

## 2019-10-10 DIAGNOSIS — I10 Essential (primary) hypertension: Secondary | ICD-10-CM | POA: Diagnosis not present

## 2019-10-10 DIAGNOSIS — G20 Parkinson's disease: Secondary | ICD-10-CM | POA: Diagnosis not present

## 2019-10-12 DIAGNOSIS — J449 Chronic obstructive pulmonary disease, unspecified: Secondary | ICD-10-CM | POA: Diagnosis not present

## 2019-10-12 DIAGNOSIS — F319 Bipolar disorder, unspecified: Secondary | ICD-10-CM | POA: Diagnosis not present

## 2019-10-12 DIAGNOSIS — S32602D Unspecified fracture of left ischium, subsequent encounter for fracture with routine healing: Secondary | ICD-10-CM | POA: Diagnosis not present

## 2019-10-12 DIAGNOSIS — Z9181 History of falling: Secondary | ICD-10-CM | POA: Diagnosis not present

## 2019-10-12 DIAGNOSIS — G20 Parkinson's disease: Secondary | ICD-10-CM | POA: Diagnosis not present

## 2019-10-12 DIAGNOSIS — M810 Age-related osteoporosis without current pathological fracture: Secondary | ICD-10-CM | POA: Diagnosis not present

## 2019-10-12 DIAGNOSIS — Z741 Need for assistance with personal care: Secondary | ICD-10-CM | POA: Diagnosis not present

## 2019-10-12 DIAGNOSIS — I10 Essential (primary) hypertension: Secondary | ICD-10-CM | POA: Diagnosis not present

## 2019-10-14 DIAGNOSIS — I10 Essential (primary) hypertension: Secondary | ICD-10-CM | POA: Diagnosis not present

## 2019-10-14 DIAGNOSIS — F319 Bipolar disorder, unspecified: Secondary | ICD-10-CM | POA: Diagnosis not present

## 2019-10-14 DIAGNOSIS — Z741 Need for assistance with personal care: Secondary | ICD-10-CM | POA: Diagnosis not present

## 2019-10-14 DIAGNOSIS — M810 Age-related osteoporosis without current pathological fracture: Secondary | ICD-10-CM | POA: Diagnosis not present

## 2019-10-14 DIAGNOSIS — Z9181 History of falling: Secondary | ICD-10-CM | POA: Diagnosis not present

## 2019-10-14 DIAGNOSIS — S32602D Unspecified fracture of left ischium, subsequent encounter for fracture with routine healing: Secondary | ICD-10-CM | POA: Diagnosis not present

## 2019-10-14 DIAGNOSIS — G20 Parkinson's disease: Secondary | ICD-10-CM | POA: Diagnosis not present

## 2019-10-14 DIAGNOSIS — J449 Chronic obstructive pulmonary disease, unspecified: Secondary | ICD-10-CM | POA: Diagnosis not present

## 2019-10-18 DIAGNOSIS — S32602D Unspecified fracture of left ischium, subsequent encounter for fracture with routine healing: Secondary | ICD-10-CM | POA: Diagnosis not present

## 2019-10-18 DIAGNOSIS — Z9181 History of falling: Secondary | ICD-10-CM | POA: Diagnosis not present

## 2019-10-18 DIAGNOSIS — I10 Essential (primary) hypertension: Secondary | ICD-10-CM | POA: Diagnosis not present

## 2019-10-18 DIAGNOSIS — F319 Bipolar disorder, unspecified: Secondary | ICD-10-CM | POA: Diagnosis not present

## 2019-10-18 DIAGNOSIS — J449 Chronic obstructive pulmonary disease, unspecified: Secondary | ICD-10-CM | POA: Diagnosis not present

## 2019-10-18 DIAGNOSIS — Z741 Need for assistance with personal care: Secondary | ICD-10-CM | POA: Diagnosis not present

## 2019-10-18 DIAGNOSIS — G20 Parkinson's disease: Secondary | ICD-10-CM | POA: Diagnosis not present

## 2019-10-18 DIAGNOSIS — M810 Age-related osteoporosis without current pathological fracture: Secondary | ICD-10-CM | POA: Diagnosis not present

## 2019-10-21 DIAGNOSIS — F319 Bipolar disorder, unspecified: Secondary | ICD-10-CM | POA: Diagnosis not present

## 2019-10-21 DIAGNOSIS — J449 Chronic obstructive pulmonary disease, unspecified: Secondary | ICD-10-CM | POA: Diagnosis not present

## 2019-10-21 DIAGNOSIS — G20 Parkinson's disease: Secondary | ICD-10-CM | POA: Diagnosis not present

## 2019-10-21 DIAGNOSIS — M810 Age-related osteoporosis without current pathological fracture: Secondary | ICD-10-CM | POA: Diagnosis not present

## 2019-10-21 DIAGNOSIS — Z741 Need for assistance with personal care: Secondary | ICD-10-CM | POA: Diagnosis not present

## 2019-10-21 DIAGNOSIS — I10 Essential (primary) hypertension: Secondary | ICD-10-CM | POA: Diagnosis not present

## 2019-10-21 DIAGNOSIS — Z9181 History of falling: Secondary | ICD-10-CM | POA: Diagnosis not present

## 2019-10-21 DIAGNOSIS — S32602D Unspecified fracture of left ischium, subsequent encounter for fracture with routine healing: Secondary | ICD-10-CM | POA: Diagnosis not present

## 2019-10-26 DIAGNOSIS — F319 Bipolar disorder, unspecified: Secondary | ICD-10-CM | POA: Diagnosis not present

## 2019-10-26 DIAGNOSIS — Z741 Need for assistance with personal care: Secondary | ICD-10-CM | POA: Diagnosis not present

## 2019-10-26 DIAGNOSIS — G20 Parkinson's disease: Secondary | ICD-10-CM | POA: Diagnosis not present

## 2019-10-26 DIAGNOSIS — M810 Age-related osteoporosis without current pathological fracture: Secondary | ICD-10-CM | POA: Diagnosis not present

## 2019-10-26 DIAGNOSIS — S32602D Unspecified fracture of left ischium, subsequent encounter for fracture with routine healing: Secondary | ICD-10-CM | POA: Diagnosis not present

## 2019-10-26 DIAGNOSIS — I10 Essential (primary) hypertension: Secondary | ICD-10-CM | POA: Diagnosis not present

## 2019-10-26 DIAGNOSIS — J449 Chronic obstructive pulmonary disease, unspecified: Secondary | ICD-10-CM | POA: Diagnosis not present

## 2019-10-26 DIAGNOSIS — Z9181 History of falling: Secondary | ICD-10-CM | POA: Diagnosis not present

## 2019-10-27 DIAGNOSIS — S32602D Unspecified fracture of left ischium, subsequent encounter for fracture with routine healing: Secondary | ICD-10-CM | POA: Diagnosis not present

## 2019-10-27 DIAGNOSIS — M810 Age-related osteoporosis without current pathological fracture: Secondary | ICD-10-CM | POA: Diagnosis not present

## 2019-10-27 DIAGNOSIS — F319 Bipolar disorder, unspecified: Secondary | ICD-10-CM | POA: Diagnosis not present

## 2019-10-27 DIAGNOSIS — G20 Parkinson's disease: Secondary | ICD-10-CM | POA: Diagnosis not present

## 2019-10-27 DIAGNOSIS — I10 Essential (primary) hypertension: Secondary | ICD-10-CM | POA: Diagnosis not present

## 2019-10-27 DIAGNOSIS — Z9181 History of falling: Secondary | ICD-10-CM | POA: Diagnosis not present

## 2019-10-27 DIAGNOSIS — Z741 Need for assistance with personal care: Secondary | ICD-10-CM | POA: Diagnosis not present

## 2019-10-27 DIAGNOSIS — J449 Chronic obstructive pulmonary disease, unspecified: Secondary | ICD-10-CM | POA: Diagnosis not present

## 2019-10-31 DIAGNOSIS — Z9181 History of falling: Secondary | ICD-10-CM | POA: Diagnosis not present

## 2019-10-31 DIAGNOSIS — F319 Bipolar disorder, unspecified: Secondary | ICD-10-CM | POA: Diagnosis not present

## 2019-10-31 DIAGNOSIS — G20 Parkinson's disease: Secondary | ICD-10-CM | POA: Diagnosis not present

## 2019-10-31 DIAGNOSIS — I10 Essential (primary) hypertension: Secondary | ICD-10-CM | POA: Diagnosis not present

## 2019-10-31 DIAGNOSIS — Z741 Need for assistance with personal care: Secondary | ICD-10-CM | POA: Diagnosis not present

## 2019-10-31 DIAGNOSIS — J449 Chronic obstructive pulmonary disease, unspecified: Secondary | ICD-10-CM | POA: Diagnosis not present

## 2019-10-31 DIAGNOSIS — S32602D Unspecified fracture of left ischium, subsequent encounter for fracture with routine healing: Secondary | ICD-10-CM | POA: Diagnosis not present

## 2019-10-31 DIAGNOSIS — M810 Age-related osteoporosis without current pathological fracture: Secondary | ICD-10-CM | POA: Diagnosis not present

## 2019-12-20 ENCOUNTER — Other Ambulatory Visit: Payer: Self-pay | Admitting: Internal Medicine

## 2019-12-21 ENCOUNTER — Ambulatory Visit (INDEPENDENT_AMBULATORY_CARE_PROVIDER_SITE_OTHER): Payer: Medicare PPO

## 2019-12-21 ENCOUNTER — Other Ambulatory Visit: Payer: Self-pay

## 2019-12-21 DIAGNOSIS — Z23 Encounter for immunization: Secondary | ICD-10-CM

## 2020-03-29 ENCOUNTER — Other Ambulatory Visit: Payer: Self-pay | Admitting: Internal Medicine

## 2020-03-30 ENCOUNTER — Ambulatory Visit (INDEPENDENT_AMBULATORY_CARE_PROVIDER_SITE_OTHER): Payer: Medicare PPO | Admitting: Internal Medicine

## 2020-03-30 ENCOUNTER — Encounter: Payer: Self-pay | Admitting: Internal Medicine

## 2020-03-30 ENCOUNTER — Other Ambulatory Visit: Payer: Self-pay

## 2020-03-30 VITALS — BP 130/88 | HR 69 | Temp 98.2°F | Ht 64.5 in | Wt 169.0 lb

## 2020-03-30 DIAGNOSIS — Z0000 Encounter for general adult medical examination without abnormal findings: Secondary | ICD-10-CM | POA: Diagnosis not present

## 2020-03-30 DIAGNOSIS — M818 Other osteoporosis without current pathological fracture: Secondary | ICD-10-CM

## 2020-03-30 DIAGNOSIS — I10 Essential (primary) hypertension: Secondary | ICD-10-CM | POA: Diagnosis not present

## 2020-03-30 DIAGNOSIS — K219 Gastro-esophageal reflux disease without esophagitis: Secondary | ICD-10-CM

## 2020-03-30 DIAGNOSIS — N1831 Chronic kidney disease, stage 3a: Secondary | ICD-10-CM

## 2020-03-30 DIAGNOSIS — M353 Polymyalgia rheumatica: Secondary | ICD-10-CM | POA: Diagnosis not present

## 2020-03-30 DIAGNOSIS — Z7189 Other specified counseling: Secondary | ICD-10-CM | POA: Diagnosis not present

## 2020-03-30 LAB — HEPATIC FUNCTION PANEL
ALT: 10 U/L (ref 0–35)
AST: 17 U/L (ref 0–37)
Albumin: 4 g/dL (ref 3.5–5.2)
Alkaline Phosphatase: 46 U/L (ref 39–117)
Bilirubin, Direct: 0.1 mg/dL (ref 0.0–0.3)
Total Bilirubin: 0.7 mg/dL (ref 0.2–1.2)
Total Protein: 7.1 g/dL (ref 6.0–8.3)

## 2020-03-30 LAB — RENAL FUNCTION PANEL
Albumin: 4 g/dL (ref 3.5–5.2)
BUN: 18 mg/dL (ref 6–23)
CO2: 26 mEq/L (ref 19–32)
Calcium: 10 mg/dL (ref 8.4–10.5)
Chloride: 105 mEq/L (ref 96–112)
Creatinine, Ser: 0.9 mg/dL (ref 0.40–1.20)
GFR: 58.24 mL/min — ABNORMAL LOW (ref >60.00)
Glucose, Bld: 100 mg/dL — ABNORMAL HIGH (ref 70–99)
Phosphorus: 3.2 mg/dL (ref 2.3–4.6)
Potassium: 4.3 mEq/L (ref 3.5–5.1)
Sodium: 138 mEq/L (ref 135–145)

## 2020-03-30 LAB — CBC
HCT: 42.9 % (ref 36.0–46.0)
Hemoglobin: 14.5 g/dL (ref 12.0–15.0)
MCHC: 33.8 g/dL (ref 30.0–36.0)
MCV: 92 fl (ref 78.0–100.0)
Platelets: 197 10*3/uL (ref 150.0–400.0)
RBC: 4.66 Mil/uL (ref 3.87–5.11)
RDW: 13.2 % (ref 11.5–15.5)
WBC: 5.4 10*3/uL (ref 4.0–10.5)

## 2020-03-30 LAB — SEDIMENTATION RATE: Sed Rate: 29 mm/hr (ref 0–30)

## 2020-03-30 MED ORDER — PANTOPRAZOLE SODIUM 40 MG PO TBEC
40.0000 mg | DELAYED_RELEASE_TABLET | Freq: Every day | ORAL | 3 refills | Status: DC
Start: 2020-03-30 — End: 2020-09-04

## 2020-03-30 MED ORDER — FUROSEMIDE 20 MG PO TABS: 40.0000 mg | ORAL_TABLET | Freq: Every day | ORAL | 0 refills | Status: DC | PRN

## 2020-03-30 NOTE — Assessment & Plan Note (Signed)
Has DNR 

## 2020-03-30 NOTE — Progress Notes (Signed)
Subjective:    Patient ID: Sydney [name], female    DOB: June 17, 1932, 86 y.o.   MRN: [id number]  HPI Here for Medicare wellness visit and follow up of chronic health conditions This visit occurred during the SARS-CoV-2 public health emergency.  Safety protocols were in place, including screening questions prior to the visit, additional usage of staff PPE, and extensive cleaning of exam room while observing appropriate contact time as indicated for disinfecting solutions.   Reviewed advanced directives Reviewed other doctors---Dr Woodruff--dentist Vision is okay---past cataracts---no recent eye exams Hearing is fine Tries to exercise on machine 3 days per week Some down days due to stress---not anhedonic No tobacco Rare glass of wine Does some light housework---granddaughter does all the heavy work No memory issues  Doing okay Ongoing stress with husband's care He continues to be bed bound--and that is broken now They do have aide every morning  Did have fall with pelvic fracture Has recovered from this--and is on actonel monthly for osteoporosis  No chest pain No SOB Known iliac aneurysm --no longer seeing vascular specialist No palpitations No dizziness or syncope  Continues on the tylenol daily for the chronic back pain No aching like the polymyagia  Still taking the protonix No heartburn or swallowing problems on this  Last GFR 51  Current Outpatient Medications on File Prior to Visit  Medication Sig Dispense Refill  . acetaminophen (TYLENOL) 500 MG tablet Take 500 mg by mouth as needed for mild pain.     Marland Kitchen aspirin EC 81 MG tablet Take 81 mg by mouth daily. Swallow whole.    . furosemide (LASIX) 20 MG tablet TAKE 2 TABLETS BY MOUTH EVERY DAY (Patient taking differently: Take 20 mg by mouth daily.) 180 tablet 3  . nadolol (CORGARD) 40 MG tablet TAKE 1 TABLET BY MOUTH EVERY DAY 90 tablet 3  . risedronate (ACTONEL) 150 MG tablet Take 1 tablet (150 mg total) by mouth  every 30 (thirty) days. with water on empty stomach, nothing by mouth or lie down for next 30 minutes. 3 tablet 3  . traMADol (ULTRAM) 50 MG tablet Take 1 tablet (50 mg total) by mouth every 6 (six) hours as needed. 15 tablet 0  . Vitamin D, Ergocalciferol, (DRISDOL) 1.25 MG (50000 UNIT) CAPS capsule Take 1 capsule (50,000 Units total) by mouth every 30 (thirty) days. 1 capsule 0  . calcium carbonate (OS-CAL - DOSED IN MG OF ELEMENTAL CALCIUM) 1250 (500 Ca) MG tablet Take 1 tablet (500 mg of elemental calcium total) by mouth 2 (two) times daily with a meal. 60 tablet 0   No current facility-administered medications on file prior to visit.    Allergies  Allergen Reactions  . Lipitor [Atorvastatin]     Dizzy ( fuzziness)  . Latex Rash    Past Medical History:  Diagnosis Date  . GERD (gastroesophageal reflux disease)   . Hx of lumbosacral spine surgery t  . Hyperlipidemia   . Hypertension   . Iliac artery aneurysm ([place])   . Lumbar spinal stenosis   . Polymyalgia rheumatica ([place]) 06/2016    Past Surgical History:  Procedure Laterality Date  . JOINT REPLACEMENT  1992   right hip replacement  . JOINT REPLACEMENT  2002   left hip replacement  . ROTATOR CUFF REPAIR  2002   Left shoulder  . SPINE SURGERY  2006 & 2010   posterior spinal fusion L4-5 L5S1    Family History  Problem Relation Age of Onset  .  Heart disease Mother   . Heart disease Father        Heart Disease before age 62  . Heart attack Father   . Hyperlipidemia Father   . Heart disease Sister   . Diabetes Sister   . Heart disease Brother   . Hyperlipidemia Brother   . Heart disease Brother   . Cancer Brother     Social History   Socioeconomic History  . Marital status: Married    Spouse name: Not on file  . Number of children: Not on file  . Years of education: Not on file  . Highest education level: Not on file  Occupational History  . Not on file  Tobacco Use  . Smoking status: Never Smoker  .  Smokeless tobacco: Never Used  Substance and Sexual Activity  . Alcohol use: No    Alcohol/week: 0.0 standard drinks  . Drug use: No  . Sexual activity: Not on file  Other Topics Concern  . Not on file  Social History Narrative   Has living will   Husband is health care POA--- alternate is daughter Sydney [name] (then son Sydney [name])   Discussed DNR--she requests (done 11/30/12)   No tube feedings if cognitively unaware   Social Determinants of Health   Financial Resource Strain: Not on file  Food Insecurity: Not on file  Transportation Needs: Not on file  Physical Activity: Not on file  Stress: Not on file  Social Connections: Not on file  Intimate Partner Violence: Not on file   Review of Systems Appetite is fine Weight stable Sleeps okay unless husband wakes her Wears seat belt Teeth okay--sees dentist Bowels are regular--no blood Some shoulder pain in addition to the back Dark area on left temple--looks benign. No other worrisome lesions    Objective:   Physical Exam Constitutional:      Appearance: Normal appearance.  HENT:     Mouth/Throat:     Comments: No lesions Eyes:     Conjunctiva/sclera: Conjunctivae normal.     Pupils: Pupils are equal, round, and reactive to light.  Cardiovascular:     Rate and Rhythm: Normal rate and regular rhythm.     Pulses: Normal pulses.     Heart sounds: No murmur heard. No gallop.   Pulmonary:     Effort: Pulmonary effort is normal.     Breath sounds: Normal breath sounds. No wheezing or rales.  Abdominal:     Palpations: Abdomen is soft.     Tenderness: There is no abdominal tenderness.  Musculoskeletal:     Right lower leg: No edema.     Left lower leg: No edema.  Skin:    Findings: No rash.     Comments: Left temporal lesion is benign  Neurological:     Mental Status: She is alert and oriented to person, place, and time.     Comments: President---"Joe Biden, Trump, Obama" [id number] Recall 3/3   Psychiatric:        Mood and Affect: Mood normal.        Behavior: Behavior normal.            Assessment & Plan:

## 2020-03-30 NOTE — Assessment & Plan Note (Signed)
Will recheck No action unless worsening

## 2020-03-30 NOTE — Assessment & Plan Note (Signed)
BP Readings from Last 3 Encounters:  03/30/20 130/88  09/28/19 122/68  09/06/19 (!) 120/57   Good control on nadolol Change furosemide to prn

## 2020-03-30 NOTE — Assessment & Plan Note (Signed)
I have personally reviewed the Medicare Annual Wellness questionnaire and have noted 1. The patient's medical and social history 2. Their use of alcohol, tobacco or illicit drugs 3. Their current medications and supplements 4. The patient's functional ability including ADL's, fall risks, home safety risks and hearing or visual             impairment. 5. Diet and physical activities 6. Evidence for depression or mood disorders  The patients weight, height, BMI and visual acuity have been recorded in the chart I have made referrals, counseling and provided education to the patient based review of the above and I have provided the pt with a written personalized care plan for preventive services.  I have provided you with a copy of your personalized plan for preventive services. Please take the time to review along with your updated medication list.  Done with cancer screening Yearly flu vaccine Discussed exercise---adding leg resistance training

## 2020-03-30 NOTE — Assessment & Plan Note (Signed)
Seems to be quiet Will check sed rate Hasn't been on prednisone in some time

## 2020-03-30 NOTE — Assessment & Plan Note (Signed)
Will continue the actonel till 2026

## 2020-03-30 NOTE — Patient Instructions (Signed)
You can change the furosemide (fluid pill) to daily as needed (only when your legs are swelling). If you are not having any heartburn, you can reduce the pantoprazole to every other day.

## 2020-03-30 NOTE — Progress Notes (Signed)
Hearing Screening   Method: Audiometry   125Hz  250Hz  500Hz  1000Hz  2000Hz  3000Hz  4000Hz  6000Hz  8000Hz   Right ear:   25 40 0  0    Left ear:   20 20 20  20       Visual Acuity Screening   Right eye Left eye Both eyes  Without correction: 20/20 20/20 20/20   With correction:

## 2020-03-30 NOTE — Assessment & Plan Note (Signed)
Discussed trying to cut back to every other day

## 2020-04-03 ENCOUNTER — Telehealth: Payer: Self-pay | Admitting: Internal Medicine

## 2020-04-03 NOTE — Telephone Encounter (Signed)
Patient called in stating their cleaning lady tested positive for covid today after she left their house. Stated she was not wearing mask and did not show any symptoms. Stated they were maybe 4ft apart, but she did clean for them, place new sheets, etc. Throughout the house. Both patient and husband are vaccinated. Seeking advice on what to in regards to next steps. Will quarantine till heard other wise.

## 2020-04-04 NOTE — Telephone Encounter (Signed)
Spoke to pt. She will let us know if she ends up getting tested.

## 2020-04-04 NOTE — Telephone Encounter (Signed)
They should just quarantine and wait. If symptoms, they should be tested because they would be candidates for monoclonal antibodies (though they may not be as effective for omicron). He won't be able to get this regardless, since he can't leave house

## 2020-08-08 ENCOUNTER — Ambulatory Visit (INDEPENDENT_AMBULATORY_CARE_PROVIDER_SITE_OTHER): Payer: Medicare PPO | Admitting: Internal Medicine

## 2020-08-08 ENCOUNTER — Encounter: Payer: Self-pay | Admitting: Internal Medicine

## 2020-08-08 ENCOUNTER — Other Ambulatory Visit: Payer: Self-pay

## 2020-08-08 DIAGNOSIS — H9313 Tinnitus, bilateral: Secondary | ICD-10-CM | POA: Diagnosis not present

## 2020-08-08 DIAGNOSIS — H9319 Tinnitus, unspecified ear: Secondary | ICD-10-CM | POA: Insufficient documentation

## 2020-08-08 NOTE — Assessment & Plan Note (Signed)
Not really bothersome No hearing loss, headache, neurologic symptoms Reassured--no action needed

## 2020-08-08 NOTE — Progress Notes (Signed)
Subjective:    Patient ID: Sydney [name], female    DOB: 19-Jun-1932, 87 y.o.   MRN: [id number]  HPI Here due to ringing in her ears This visit occurred during the SARS-CoV-2 public health emergency.  Safety protocols were in place, including screening questions prior to the visit, additional usage of staff PPE, and extensive cleaning of exam room while observing appropriate contact time as indicated for disinfecting solutions.   Has had long standing ringing/buzzing in her ears Thought it was the refrigerator but moves with her Goes back many months at least--just wasn't paying much attention No hearing loss No excessive noise exposure No vertigo  Has place on back to be checked  Some shoulder aching--voltaren gel helps  Current Outpatient Medications on File Prior to Visit  Medication Sig Dispense Refill  . acetaminophen (TYLENOL) 500 MG tablet Take 500 mg by mouth as needed for mild pain.     Marland Kitchen aspirin EC 81 MG tablet Take 81 mg by mouth daily. Swallow whole.    . furosemide (LASIX) 20 MG tablet Take 2 tablets (40 mg total) by mouth daily as needed. For morning leg swelling 1 tablet 0  . nadolol (CORGARD) 40 MG tablet TAKE 1 TABLET BY MOUTH EVERY DAY 90 tablet 3  . pantoprazole (PROTONIX) 40 MG tablet Take 1 tablet (40 mg total) by mouth daily. 90 tablet 3  . risedronate (ACTONEL) 150 MG tablet Take 1 tablet (150 mg total) by mouth every 30 (thirty) days. with water on empty stomach, nothing by mouth or lie down for next 30 minutes. 3 tablet 3  . calcium carbonate (OS-CAL - DOSED IN MG OF ELEMENTAL CALCIUM) 1250 (500 Ca) MG tablet Take 1 tablet (500 mg of elemental calcium total) by mouth 2 (two) times daily with a meal. 60 tablet 0   No current facility-administered medications on file prior to visit.    Allergies  Allergen Reactions  . Lipitor [Atorvastatin]     Dizzy ( fuzziness)  . Latex Rash    Past Medical History:  Diagnosis Date  . GERD (gastroesophageal reflux  disease)   . Hx of lumbosacral spine surgery t  . Hyperlipidemia   . Hypertension   . Iliac artery aneurysm ([place])   . Lumbar spinal stenosis   . Polymyalgia rheumatica ([place]) 06/2016    Past Surgical History:  Procedure Laterality Date  . JOINT REPLACEMENT  1992   right hip replacement  . JOINT REPLACEMENT  2002   left hip replacement  . ROTATOR CUFF REPAIR  2002   Left shoulder  . SPINE SURGERY  2006 & 2010   posterior spinal fusion L4-5 L5S1    Family History  Problem Relation Age of Onset  . Heart disease Mother   . Heart disease Father        Heart Disease before age 87  . Heart attack Father   . Hyperlipidemia Father   . Heart disease Sister   . Diabetes Sister   . Heart disease Brother   . Hyperlipidemia Brother   . Heart disease Brother   . Cancer Brother     Social History   Socioeconomic History  . Marital status: Widowed    Spouse name: Not on file  . Number of children: Not on file  . Years of education: Not on file  . Highest education level: Not on file  Occupational History  . Not on file  Tobacco Use  . Smoking status: Never Smoker  . Smokeless  tobacco: Never Used  Substance and Sexual Activity  . Alcohol use: No    Alcohol/week: 0.0 standard drinks  . Drug use: No  . Sexual activity: Not on file  Other Topics Concern  . Not on file  Social History Narrative   Has living will   Daughter [name] is health care POA (then son [name])   Discussed DNR--she requests (done 11/30/12)   No tube feedings if cognitively unaware   Social Determinants of Health   Financial Resource Strain: Not on file  Food Insecurity: Not on file  Transportation Needs: Not on file  Physical Activity: Not on file  Stress: Not on file  Social Connections: Not on file  Intimate Partner Violence: Not on file   Review of Systems Mourning for husband--- but doing okay. Support from church, family, etc    Objective:   Physical Exam HENT:     Right Ear: Tympanic  membrane, ear canal and external ear normal.     Left Ear: Tympanic membrane, ear canal and external ear normal.  Skin:    Comments: ~54mm seborrheic keratosis on mid upper back            Assessment & Plan:

## 2020-08-18 ENCOUNTER — Emergency Department (HOSPITAL_COMMUNITY): Payer: Medicare PPO

## 2020-08-18 ENCOUNTER — Other Ambulatory Visit: Payer: Self-pay

## 2020-08-18 ENCOUNTER — Inpatient Hospital Stay (HOSPITAL_COMMUNITY)
Admission: EM | Admit: 2020-08-18 | Discharge: 2020-08-27 | DRG: 113 | Disposition: A | Discharge status: Rehabilitation Facility | Payer: Medicare PPO | Attending: Internal Medicine | Admitting: Internal Medicine

## 2020-08-18 DIAGNOSIS — Z66 Do not resuscitate: Secondary | ICD-10-CM | POA: Diagnosis present

## 2020-08-18 DIAGNOSIS — M2211 Recurrent subluxation of patella, right knee: Secondary | ICD-10-CM | POA: Diagnosis present

## 2020-08-18 DIAGNOSIS — S83011A Lateral subluxation of right patella, initial encounter: Secondary | ICD-10-CM | POA: Diagnosis not present

## 2020-08-18 DIAGNOSIS — S0285XA Fracture of orbit, unspecified, initial encounter for closed fracture: Secondary | ICD-10-CM | POA: Diagnosis not present

## 2020-08-18 DIAGNOSIS — Y92018 Other place in single-family (private) house as the place of occurrence of the external cause: Secondary | ICD-10-CM

## 2020-08-18 DIAGNOSIS — S52501A Unspecified fracture of the lower end of right radius, initial encounter for closed fracture: Secondary | ICD-10-CM | POA: Diagnosis present

## 2020-08-18 DIAGNOSIS — M159 Polyosteoarthritis, unspecified: Secondary | ICD-10-CM | POA: Diagnosis present

## 2020-08-18 DIAGNOSIS — E0789 Other specified disorders of thyroid: Secondary | ICD-10-CM | POA: Diagnosis not present

## 2020-08-18 DIAGNOSIS — Z79899 Other long term (current) drug therapy: Secondary | ICD-10-CM | POA: Diagnosis not present

## 2020-08-18 DIAGNOSIS — R52 Pain, unspecified: Secondary | ICD-10-CM

## 2020-08-18 DIAGNOSIS — H05231 Hemorrhage of right orbit: Secondary | ICD-10-CM | POA: Diagnosis not present

## 2020-08-18 DIAGNOSIS — Z9181 History of falling: Secondary | ICD-10-CM | POA: Diagnosis not present

## 2020-08-18 DIAGNOSIS — I10 Essential (primary) hypertension: Secondary | ICD-10-CM | POA: Diagnosis present

## 2020-08-18 DIAGNOSIS — M353 Polymyalgia rheumatica: Secondary | ICD-10-CM | POA: Diagnosis present

## 2020-08-18 DIAGNOSIS — S0230XA Fracture of orbital floor, unspecified side, initial encounter for closed fracture: Secondary | ICD-10-CM | POA: Diagnosis not present

## 2020-08-18 DIAGNOSIS — Y92009 Unspecified place in unspecified non-institutional (private) residence as the place of occurrence of the external cause: Secondary | ICD-10-CM | POA: Diagnosis not present

## 2020-08-18 DIAGNOSIS — S0093XA Contusion of unspecified part of head, initial encounter: Secondary | ICD-10-CM | POA: Diagnosis present

## 2020-08-18 DIAGNOSIS — S066X0A Traumatic subarachnoid hemorrhage without loss of consciousness, initial encounter: Secondary | ICD-10-CM | POA: Diagnosis present

## 2020-08-18 DIAGNOSIS — M2212 Recurrent subluxation of patella, left knee: Secondary | ICD-10-CM | POA: Diagnosis present

## 2020-08-18 DIAGNOSIS — Z96643 Presence of artificial hip joint, bilateral: Secondary | ICD-10-CM | POA: Diagnosis present

## 2020-08-18 DIAGNOSIS — Z20822 Contact with and (suspected) exposure to covid-19: Secondary | ICD-10-CM | POA: Diagnosis present

## 2020-08-18 DIAGNOSIS — R4182 Altered mental status, unspecified: Secondary | ICD-10-CM | POA: Diagnosis not present

## 2020-08-18 DIAGNOSIS — Z91040 Latex allergy status: Secondary | ICD-10-CM

## 2020-08-18 DIAGNOSIS — I129 Hypertensive chronic kidney disease with stage 1 through stage 4 chronic kidney disease, or unspecified chronic kidney disease: Secondary | ICD-10-CM | POA: Diagnosis not present

## 2020-08-18 DIAGNOSIS — K5901 Slow transit constipation: Secondary | ICD-10-CM | POA: Diagnosis not present

## 2020-08-18 DIAGNOSIS — E8809 Other disorders of plasma-protein metabolism, not elsewhere classified: Secondary | ICD-10-CM | POA: Diagnosis not present

## 2020-08-18 DIAGNOSIS — Z981 Arthrodesis status: Secondary | ICD-10-CM

## 2020-08-18 DIAGNOSIS — M25551 Pain in right hip: Secondary | ICD-10-CM | POA: Diagnosis not present

## 2020-08-18 DIAGNOSIS — Z7982 Long term (current) use of aspirin: Secondary | ICD-10-CM

## 2020-08-18 DIAGNOSIS — N1831 Chronic kidney disease, stage 3a: Secondary | ICD-10-CM | POA: Diagnosis not present

## 2020-08-18 DIAGNOSIS — R001 Bradycardia, unspecified: Secondary | ICD-10-CM | POA: Diagnosis not present

## 2020-08-18 DIAGNOSIS — K5900 Constipation, unspecified: Secondary | ICD-10-CM | POA: Diagnosis present

## 2020-08-18 DIAGNOSIS — Z833 Family history of diabetes mellitus: Secondary | ICD-10-CM

## 2020-08-18 DIAGNOSIS — Z8249 Family history of ischemic heart disease and other diseases of the circulatory system: Secondary | ICD-10-CM

## 2020-08-18 DIAGNOSIS — I723 Aneurysm of iliac artery: Secondary | ICD-10-CM | POA: Diagnosis present

## 2020-08-18 DIAGNOSIS — W19XXXA Unspecified fall, initial encounter: Secondary | ICD-10-CM | POA: Diagnosis not present

## 2020-08-18 DIAGNOSIS — W01190A Fall on same level from slipping, tripping and stumbling with subsequent striking against furniture, initial encounter: Secondary | ICD-10-CM | POA: Diagnosis present

## 2020-08-18 DIAGNOSIS — Z83438 Family history of other disorder of lipoprotein metabolism and other lipidemia: Secondary | ICD-10-CM

## 2020-08-18 DIAGNOSIS — S0231XD Fracture of orbital floor, right side, subsequent encounter for fracture with routine healing: Secondary | ICD-10-CM | POA: Diagnosis not present

## 2020-08-18 DIAGNOSIS — I609 Nontraumatic subarachnoid hemorrhage, unspecified: Secondary | ICD-10-CM | POA: Diagnosis not present

## 2020-08-18 DIAGNOSIS — I629 Nontraumatic intracranial hemorrhage, unspecified: Secondary | ICD-10-CM | POA: Diagnosis not present

## 2020-08-18 DIAGNOSIS — S52501D Unspecified fracture of the lower end of right radius, subsequent encounter for closed fracture with routine healing: Secondary | ICD-10-CM | POA: Diagnosis not present

## 2020-08-18 DIAGNOSIS — G8918 Other acute postprocedural pain: Secondary | ICD-10-CM | POA: Diagnosis not present

## 2020-08-18 DIAGNOSIS — Z888 Allergy status to other drugs, medicaments and biological substances status: Secondary | ICD-10-CM

## 2020-08-18 DIAGNOSIS — M1711 Unilateral primary osteoarthritis, right knee: Secondary | ICD-10-CM | POA: Diagnosis not present

## 2020-08-18 DIAGNOSIS — K219 Gastro-esophageal reflux disease without esophagitis: Secondary | ICD-10-CM | POA: Diagnosis present

## 2020-08-18 DIAGNOSIS — H05421 Enophthalmos due to trauma or surgery, right eye: Secondary | ICD-10-CM | POA: Diagnosis present

## 2020-08-18 DIAGNOSIS — S62101A Fracture of unspecified carpal bone, right wrist, initial encounter for closed fracture: Secondary | ICD-10-CM | POA: Diagnosis not present

## 2020-08-18 DIAGNOSIS — R54 Age-related physical debility: Secondary | ICD-10-CM | POA: Diagnosis present

## 2020-08-18 DIAGNOSIS — M810 Age-related osteoporosis without current pathological fracture: Secondary | ICD-10-CM | POA: Diagnosis present

## 2020-08-18 DIAGNOSIS — R5381 Other malaise: Secondary | ICD-10-CM | POA: Diagnosis not present

## 2020-08-18 DIAGNOSIS — M25531 Pain in right wrist: Secondary | ICD-10-CM | POA: Diagnosis not present

## 2020-08-18 DIAGNOSIS — M7989 Other specified soft tissue disorders: Secondary | ICD-10-CM | POA: Diagnosis not present

## 2020-08-18 DIAGNOSIS — S0231XA Fracture of orbital floor, right side, initial encounter for closed fracture: Secondary | ICD-10-CM | POA: Diagnosis present

## 2020-08-18 DIAGNOSIS — Z043 Encounter for examination and observation following other accident: Secondary | ICD-10-CM | POA: Diagnosis not present

## 2020-08-18 DIAGNOSIS — S066X0D Traumatic subarachnoid hemorrhage without loss of consciousness, subsequent encounter: Secondary | ICD-10-CM | POA: Diagnosis not present

## 2020-08-18 DIAGNOSIS — E46 Unspecified protein-calorie malnutrition: Secondary | ICD-10-CM | POA: Diagnosis not present

## 2020-08-18 DIAGNOSIS — E785 Hyperlipidemia, unspecified: Secondary | ICD-10-CM | POA: Diagnosis not present

## 2020-08-18 DIAGNOSIS — S0990XA Unspecified injury of head, initial encounter: Secondary | ICD-10-CM | POA: Diagnosis not present

## 2020-08-18 DIAGNOSIS — S060X0A Concussion without loss of consciousness, initial encounter: Secondary | ICD-10-CM | POA: Diagnosis not present

## 2020-08-18 DIAGNOSIS — M25461 Effusion, right knee: Secondary | ICD-10-CM | POA: Diagnosis not present

## 2020-08-18 DIAGNOSIS — S066X0S Traumatic subarachnoid hemorrhage without loss of consciousness, sequela: Secondary | ICD-10-CM | POA: Diagnosis not present

## 2020-08-18 LAB — CBC WITH DIFFERENTIAL/PLATELET
Abs Immature Granulocytes: 0.05 10*3/uL (ref 0.00–0.07)
Basophils Absolute: 0 10*3/uL (ref 0.0–0.1)
Basophils Relative: 0 %
Eosinophils Absolute: 0.1 10*3/uL (ref 0.0–0.5)
Eosinophils Relative: 1 %
HCT: 46.4 % — ABNORMAL HIGH (ref 36.0–46.0)
Hemoglobin: 15.2 g/dL — ABNORMAL HIGH (ref 12.0–15.0)
Immature Granulocytes: 1 %
Lymphocytes Relative: 12 %
Lymphs Abs: 1.1 10*3/uL (ref 0.7–4.0)
MCH: 30.6 pg (ref 26.0–34.0)
MCHC: 32.8 g/dL (ref 30.0–36.0)
MCV: 93.5 fL (ref 80.0–100.0)
Monocytes Absolute: 0.6 10*3/uL (ref 0.1–1.0)
Monocytes Relative: 6 %
Neutro Abs: 7.3 10*3/uL (ref 1.7–7.7)
Neutrophils Relative %: 80 %
Platelets: 198 10*3/uL (ref 150–400)
RBC: 4.96 MIL/uL (ref 3.87–5.11)
RDW: 13.2 % (ref 11.5–15.5)
WBC: 9.2 10*3/uL (ref 4.0–10.5)
nRBC: 0 % (ref 0.0–0.2)

## 2020-08-18 LAB — I-STAT CHEM 8, ED
BUN: 14 mg/dL (ref 8–23)
Calcium, Ion: 1.11 mmol/L — ABNORMAL LOW (ref 1.15–1.40)
Chloride: 110 mmol/L (ref 98–111)
Creatinine, Ser: 0.8 mg/dL (ref 0.44–1.00)
Glucose, Bld: 135 mg/dL — ABNORMAL HIGH (ref 70–99)
HCT: 44 % (ref 36.0–46.0)
Hemoglobin: 15 g/dL (ref 12.0–15.0)
Potassium: 4.2 mmol/L (ref 3.5–5.1)
Sodium: 141 mmol/L (ref 135–145)
TCO2: 23 mmol/L (ref 22–32)

## 2020-08-18 LAB — RESP PANEL BY RT-PCR (FLU A&B, COVID) ARPGX2
Influenza A by PCR: NEGATIVE
Influenza B by PCR: NEGATIVE
SARS Coronavirus 2 by RT PCR: NEGATIVE

## 2020-08-18 LAB — COMPREHENSIVE METABOLIC PANEL
ALT: 13 U/L (ref 0–44)
AST: 23 U/L (ref 15–41)
Albumin: 3.8 g/dL (ref 3.5–5.0)
Alkaline Phosphatase: 41 U/L (ref 38–126)
Anion gap: 11 (ref 5–15)
BUN: 13 mg/dL (ref 8–23)
CO2: 21 mmol/L — ABNORMAL LOW (ref 22–32)
Calcium: 9.6 mg/dL (ref 8.9–10.3)
Chloride: 108 mmol/L (ref 98–111)
Creatinine, Ser: 0.89 mg/dL (ref 0.44–1.00)
GFR, Estimated: >60 mL/min (ref >60)
Glucose, Bld: 135 mg/dL — ABNORMAL HIGH (ref 70–99)
Potassium: 4.3 mmol/L (ref 3.5–5.1)
Sodium: 140 mmol/L (ref 135–145)
Total Bilirubin: 1.2 mg/dL (ref 0.3–1.2)
Total Protein: 6.5 g/dL (ref 6.5–8.1)

## 2020-08-18 LAB — TYPE AND SCREEN
ABO/RH(D): A NEG
Antibody Screen: NEGATIVE

## 2020-08-18 LAB — PROTIME-INR
INR: 1.1 (ref 0.8–1.2)
Prothrombin Time: 13.8 seconds (ref 11.4–15.2)

## 2020-08-18 MED ORDER — PANTOPRAZOLE SODIUM 40 MG IV SOLR
40.0000 mg | Freq: Once | INTRAVENOUS | Status: AC
  Administered 2020-08-18: 40 mg via INTRAVENOUS
  Filled 2020-08-18: qty 40

## 2020-08-18 MED ORDER — PANTOPRAZOLE SODIUM 40 MG PO TBEC
40.0000 mg | DELAYED_RELEASE_TABLET | Freq: Every day | ORAL | Status: DC
  Administered 2020-08-19 – 2020-08-27 (×9): 40 mg via ORAL
  Filled 2020-08-18 (×9): qty 1

## 2020-08-18 MED ORDER — TETRACAINE HCL 0.5 % OP SOLN
2.0000 [drp] | Freq: Once | OPHTHALMIC | Status: AC
  Administered 2020-08-18: 2 [drp] via OPHTHALMIC
  Filled 2020-08-18: qty 4

## 2020-08-18 MED ORDER — ONDANSETRON HCL 4 MG/2ML IJ SOLN: 4.0000 mg | INTRAMUSCULAR | Status: DC | PRN

## 2020-08-18 MED ORDER — HYDRALAZINE HCL 20 MG/ML IJ SOLN
10.0000 mg | Freq: Once | INTRAMUSCULAR | Status: DC
  Filled 2020-08-18: qty 1

## 2020-08-18 MED ORDER — HYDRALAZINE HCL 20 MG/ML IJ SOLN: 10.0000 mg | INTRAMUSCULAR | Status: DC | PRN

## 2020-08-18 MED ORDER — LACTATED RINGERS IV SOLN: INTRAVENOUS | Status: DC

## 2020-08-18 MED ORDER — ONDANSETRON HCL 4 MG/2ML IJ SOLN
4.0000 mg | Freq: Four times a day (QID) | INTRAMUSCULAR | Status: DC | PRN
Start: 2020-08-18 — End: 2020-08-27

## 2020-08-18 MED ORDER — MORPHINE SULFATE (PF) 2 MG/ML IV SOLN
2.0000 mg | INTRAVENOUS | Status: DC | PRN
  Administered 2020-08-18 – 2020-08-20 (×5): 2 mg via INTRAVENOUS
  Filled 2020-08-18 (×5): qty 1

## 2020-08-18 MED ORDER — ACETAMINOPHEN 500 MG PO TABS
500.0000 mg | ORAL_TABLET | Freq: Four times a day (QID) | ORAL | Status: DC | PRN
  Administered 2020-08-18 – 2020-08-26 (×7): 500 mg via ORAL
  Filled 2020-08-18 (×7): qty 1

## 2020-08-18 MED ORDER — ACETAMINOPHEN 500 MG PO TABS
1000.0000 mg | ORAL_TABLET | Freq: Once | ORAL | Status: AC
  Administered 2020-08-18: 1000 mg via ORAL
  Filled 2020-08-18: qty 2

## 2020-08-18 MED ORDER — ONDANSETRON HCL 4 MG/2ML IJ SOLN
INTRAMUSCULAR | Status: AC
  Administered 2020-08-18: 4 mg via INTRAVENOUS
  Filled 2020-08-18: qty 2

## 2020-08-18 MED ORDER — ONDANSETRON HCL 4 MG PO TABS
4.0000 mg | ORAL_TABLET | Freq: Four times a day (QID) | ORAL | Status: DC | PRN
  Administered 2020-08-20: 4 mg via ORAL
  Filled 2020-08-18: qty 1

## 2020-08-18 MED ORDER — OXYCODONE HCL 5 MG PO TABS
5.0000 mg | ORAL_TABLET | ORAL | Status: DC | PRN
  Administered 2020-08-18 – 2020-08-26 (×15): 5 mg via ORAL
  Filled 2020-08-18 (×15): qty 1

## 2020-08-18 NOTE — Consult Note (Signed)
Called about pt with right orbital floor fracture after fall.  Concern for retrobulbar hemorrhage on CT but evaluated by ophthalmology with no acute intervention needed.  Fracture is displaced inferiorly into the sinus along with some orbital contents.  May need operative fixation.  She is currently proptotic and swollen so will re-evaluate in a few days to make surgical plan.  If admitted we'll follow her progress.  If discharged I'll see her in clinic this week.

## 2020-08-18 NOTE — ED Notes (Signed)
Patient transported to X-ray 

## 2020-08-18 NOTE — Consult Note (Signed)
BP (!) 126/58   Pulse (!) 56   Temp 97.6 F (36.4 C) (Oral)   Resp 16   SpO2 100%  Sydney [name] fell approximately 0600 while at home, her daughter heard her fall, and immediately went to her room. She hit her forehead on the dresser resulting in an orbital fracture on the right side. A head CT showed a miniscule amount of blood in the subarachnoid space in the left frontal lobe. Her arm was caught in awkward position when she fell.  Allergies  Allergen Reactions  . Lipitor [Atorvastatin]     Dizzy ( fuzziness)  . Latex Rash   Past Surgical History:  Procedure Laterality Date  . JOINT REPLACEMENT  1992   right hip replacement  . JOINT REPLACEMENT  2002   left hip replacement  . ROTATOR CUFF REPAIR  2002   Left shoulder  . SPINE SURGERY  2006 & 2010   posterior spinal fusion L4-5 L5S1   Past Medical History:  Diagnosis Date  . GERD (gastroesophageal reflux disease)   . Hx of lumbosacral spine surgery t  . Hyperlipidemia   . Hypertension   . Iliac artery aneurysm ([place])   . Lumbar spinal stenosis   . Polymyalgia rheumatica ([place]) 06/2016   Family History  Problem Relation Age of Onset  . Heart disease Mother   . Heart disease Father        Heart Disease before age 87  . Heart attack Father   . Hyperlipidemia Father   . Heart disease Sister   . Diabetes Sister   . Heart disease Brother   . Hyperlipidemia Brother   . Heart disease Brother   . Cancer Brother    Social History   Socioeconomic History  . Marital status: Widowed    Spouse name: Not on file  . Number of children: Not on file  . Years of education: Not on file  . Highest education level: Not on file  Occupational History  . Not on file  Tobacco Use  . Smoking status: Never Smoker  . Smokeless tobacco: Never Used  Substance and Sexual Activity  . Alcohol use: No    Alcohol/week: 0.0 standard drinks  . Drug use: No  . Sexual activity: Not on file  Other Topics Concern  . Not on file  Social  History Narrative   Has living will   Daughter [name] is health care POA (then son [name])   Discussed DNR--she requests (done 11/30/12)   No tube feedings if cognitively unaware   Social Determinants of Health   Financial Resource Strain: Not on file  Food Insecurity: Not on file  Transportation Needs: Not on file  Physical Activity: Not on file  Stress: Not on file  Social Connections: Not on file  Intimate Partner Violence: Not on file   Results for orders placed or performed during the hospital encounter of 08/18/20 (from the past 24 hour(s))  Comprehensive metabolic panel     Status: Abnormal   Collection Time: 08/18/20  8:37 AM  Result Value Ref Range   Sodium 140 135 - 145 mmol/L   Potassium 4.3 3.5 - 5.1 mmol/L   Chloride 108 98 - 111 mmol/L   CO2 21 (L) 22 - 32 mmol/L   Glucose, Bld 135 (H) 70 - 99 mg/dL   BUN 13 8 - 23 mg/dL   Creatinine, Ser 0.89 0.44 - 1.00 mg/dL   Calcium 9.6 8.9 - 10.3 mg/dL   Total Protein 6.5 6.5 -  8.1 g/dL   Albumin 3.8 3.5 - 5.0 g/dL   AST 23 15 - 41 U/L   ALT 13 0 - 44 U/L   Alkaline Phosphatase 41 38 - 126 U/L   Total Bilirubin 1.2 0.3 - 1.2 mg/dL   GFR, Estimated >60 >60 mL/min   Anion gap 11 5 - 15  CBC with Differential     Status: Abnormal   Collection Time: 08/18/20  8:37 AM  Result Value Ref Range   WBC 9.2 4.0 - 10.5 K/uL   RBC 4.96 3.87 - 5.11 MIL/uL   Hemoglobin 15.2 (H) 12.0 - 15.0 g/dL   HCT 46.4 (H) 36.0 - 46.0 %   MCV 93.5 80.0 - 100.0 fL   MCH 30.6 26.0 - 34.0 pg   MCHC 32.8 30.0 - 36.0 g/dL   RDW 13.2 11.5 - 15.5 %   Platelets 198 150 - 400 K/uL   nRBC 0.0 0.0 - 0.2 %   Neutrophils Relative % 80 %   Neutro Abs 7.3 1.7 - 7.7 K/uL   Lymphocytes Relative 12 %   Lymphs Abs 1.1 0.7 - 4.0 K/uL   Monocytes Relative 6 %   Monocytes Absolute 0.6 0.1 - 1.0 K/uL   Eosinophils Relative 1 %   Eosinophils Absolute 0.1 0.0 - 0.5 K/uL   Basophils Relative 0 %   Basophils Absolute 0.0 0.0 - 0.1 K/uL   Immature Granulocytes 1  %   Abs Immature Granulocytes 0.05 0.00 - 0.07 K/uL  Protime-INR     Status: None   Collection Time: 08/18/20  8:37 AM  Result Value Ref Range   Prothrombin Time 13.8 11.4 - 15.2 seconds   INR 1.1 0.8 - 1.2  I-stat chem 8, ED (not at [facility] or [facility])     Status: Abnormal   Collection Time: 08/18/20  8:52 AM  Result Value Ref Range   Sodium 141 135 - 145 mmol/L   Potassium 4.2 3.5 - 5.1 mmol/L   Chloride 110 98 - 111 mmol/L   BUN 14 8 - 23 mg/dL   Creatinine, Ser 0.80 0.44 - 1.00 mg/dL   Glucose, Bld 135 (H) 70 - 99 mg/dL   Calcium, Ion 1.11 (L) 1.15 - 1.40 mmol/L   TCO2 23 22 - 32 mmol/L   Hemoglobin 15.0 12.0 - 15.0 g/dL   HCT 44.0 36.0 - 46.0 %  Type and screen [place]     Status: None   Collection Time: 08/18/20  8:54 AM  Result Value Ref Range   ABO/RH(D) A NEG    Antibody Screen NEG    Sample Expiration      08/21/2020,2359 Performed at [facility], [address] [place], [place] [address]   Resp Panel by RT-PCR (Flu A&B, Covid) Nasopharyngeal Swab     Status: None   Collection Time: 08/18/20  9:11 AM   Specimen: Nasopharyngeal Swab; Nasopharyngeal(NP) swabs in vial transport medium  Result Value Ref Range   SARS Coronavirus 2 by RT PCR NEGATIVE NEGATIVE   Influenza A by PCR NEGATIVE NEGATIVE   Influenza B by PCR NEGATIVE NEGATIVE   Prior to Admission medications   Medication Sig Start Date End Date Taking? Authorizing Provider  acetaminophen (TYLENOL) 500 MG tablet Take 500 mg by mouth as needed for mild pain.     [provider]  aspirin EC 81 MG tablet Take 81 mg by mouth daily. Swallow whole.    [provider]  calcium carbonate (  OS-CAL - DOSED IN MG OF ELEMENTAL CALCIUM) 1250 (500 Ca) MG tablet Take 1 tablet (500 mg of elemental calcium total) by mouth 2 (two) times daily with a meal. 09/06/19 10/06/19  Donne Hazel, MD  furosemide (LASIX) 20 MG tablet Take 2 tablets (40 mg total) by mouth daily as needed. For morning  leg swelling 03/30/20   Viviana Simpler I, MD  nadolol (CORGARD) 40 MG tablet TAKE 1 TABLET BY MOUTH EVERY DAY 12/20/19   Viviana Simpler I, MD  pantoprazole (PROTONIX) 40 MG tablet Take 1 tablet (40 mg total) by mouth daily. 03/30/20   Venia Carbon, MD  risedronate (ACTONEL) 150 MG tablet Take 1 tablet (150 mg total) by mouth every 30 (thirty) days. with water on empty stomach, nothing by mouth or lie down for next 30 minutes. 09/28/19   Venia Carbon, MD   DG Wrist Complete Right  Result Date: 08/18/2020 CLINICAL DATA:  [address] at home. Patient fell over her walker. Right wrist pain. EXAM: RIGHT WRIST - COMPLETE 3+ VIEW COMPARISON:  None. FINDINGS: There is a subtle nondisplaced fracture of the distal radius, extending transversely across the metaphysis with evidence of a fracture line also intersecting the articular surface of the lunate facet. No other fractures. Widened scapholunate interval consistent with a disrupted scapholunate ligament, most likely chronic. Osteoarthritic changes at the scaphoid trapezium trapezoid articulation and the trapezium first metacarpal articulation with joint space narrowing, subchondral sclerosis and marginal osteophytes. Chondrocalcinosis noted along the triangular fibrocartilage complex. Mild dorsal soft tissue swelling. IMPRESSION: 1. Subtle nondisplaced fracture of the distal radial metaphysis with an intra-articular component. 2. No dislocation. 3. Disrupted scapholunate ligament, likely chronic. Electronically Signed   By: Lajean Manes M.D.   On: 08/18/2020 08:10   CT Head Wo Contrast  Result Date: 08/18/2020 CLINICAL DATA:  Patient fell striking the side of her face on a dresser. EXAM: CT HEAD WITHOUT CONTRAST CT ORBITS WITHOUT CONTRAST CT CERVICAL SPINE WITHOUT CONTRAST TECHNIQUE: Multidetector CT imaging of the head, cervical spine, and maxillofacial structures were performed using the standard protocol without intravenous contrast. Multiplanar CT image  reconstructions of the cervical spine and maxillofacial structures were also generated. COMPARISON:  None. FINDINGS: CT HEAD FINDINGS Brain: Small amount of subarachnoid hemorrhage lies along the anterior left frontal lobe. No other intracranial hemorrhage. Ventricles are normal in size, for the patient's age, and normal in configuration. There are no parenchymal masses or mass effect. There is no evidence of ischemic infarction. Mild hypoattenuation lines the periventricular white matter consistent with chronic microvascular ischemic change. No extra-axial masses. Vascular: No hyperdense vessel or unexpected calcification. Skull: No skull fracture. Other: None. CT ORBIT FINDINGS Osseous: Comminuted, displaced and inferiorly depressed fractures of the right orbital floor, depressed into the maxillary sinus by 1.1 cm. Fractures extend from just posterior to the inferior orbital rim posteriorly to near the orbital apex. There is a nondisplaced fracture of the anterior, inferior medial orbital wall on the right. No other fractures.  No bone lesions. Orbits: Right-sided exophthalmos. There is hemorrhage in the right postseptal orbit, intraconal as well as extraconal spaces. The right globe is intact. The extraocular muscles and optic nerve appear intact. Normal left globe and orbit. Sinuses: Right maxillary sinus is opacified with hemorrhage. Mild mucosal thickening lines multiple right ethmoid air cells. Remaining sinuses and the mastoid air cells are clear. Soft tissues: Right periorbital soft tissue swelling/hemorrhage. CT CERVICAL SPINE FINDINGS Alignment: Normal. Skull base and vertebrae: No acute fracture. No primary  bone lesion or focal pathologic process. Soft tissues and spinal canal: No prevertebral fluid or swelling. No visible canal hematoma. Disc levels: Moderate to marked loss of disc space noted from C3-C4 through the upper thoracic spine associated with endplate spurring and varying degrees of disc  bulging. There are facet degenerative changes, less pronounced, most evident on the right at C2-C3. No convincing disc herniation. Upper chest: Enlarged heterogeneous thyroid gland. No acute findings. Other: None. IMPRESSION: HEAD CT 1. Small amount of subarachnoid hemorrhage over the anterior left frontal lobe. 2. No other acute intracranial abnormality. 3. No skull fracture. ORBITAL CT 1. Comminuted depressed right orbital floor fractures, with a fracture also involving the adjacent anterior inferior right medial orbital wall. 2. Right-sided exophthalmos due to postseptal intra and extraconal orbital hemorrhage. Right globe appears intact. CERVICAL CT 1. No fracture or acute finding. 2. Enlarged heterogeneous thyroid gland. Recommend nonemergent/urgent thyroid ultrasound (ref: J Am Coll Radiol. 2015 Feb;12(2): 143-50). Critical Value/emergent results were called by telephone at the time of interpretation on 08/18/2020 at 8:54 am to provider [facility] , who verbally acknowledged these results. Electronically Signed   By: Lajean Manes M.D.   On: 08/18/2020 08:54   CT Cervical Spine Wo Contrast  Result Date: 08/18/2020 CLINICAL DATA:  Patient fell striking the side of her face on a dresser. EXAM: CT HEAD WITHOUT CONTRAST CT ORBITS WITHOUT CONTRAST CT CERVICAL SPINE WITHOUT CONTRAST TECHNIQUE: Multidetector CT imaging of the head, cervical spine, and maxillofacial structures were performed using the standard protocol without intravenous contrast. Multiplanar CT image reconstructions of the cervical spine and maxillofacial structures were also generated. COMPARISON:  None. FINDINGS: CT HEAD FINDINGS Brain: Small amount of subarachnoid hemorrhage lies along the anterior left frontal lobe. No other intracranial hemorrhage. Ventricles are normal in size, for the patient's age, and normal in configuration. There are no parenchymal masses or mass effect. There is no evidence of ischemic infarction. Mild  hypoattenuation lines the periventricular white matter consistent with chronic microvascular ischemic change. No extra-axial masses. Vascular: No hyperdense vessel or unexpected calcification. Skull: No skull fracture. Other: None. CT ORBIT FINDINGS Osseous: Comminuted, displaced and inferiorly depressed fractures of the right orbital floor, depressed into the maxillary sinus by 1.1 cm. Fractures extend from just posterior to the inferior orbital rim posteriorly to near the orbital apex. There is a nondisplaced fracture of the anterior, inferior medial orbital wall on the right. No other fractures.  No bone lesions. Orbits: Right-sided exophthalmos. There is hemorrhage in the right postseptal orbit, intraconal as well as extraconal spaces. The right globe is intact. The extraocular muscles and optic nerve appear intact. Normal left globe and orbit. Sinuses: Right maxillary sinus is opacified with hemorrhage. Mild mucosal thickening lines multiple right ethmoid air cells. Remaining sinuses and the mastoid air cells are clear. Soft tissues: Right periorbital soft tissue swelling/hemorrhage. CT CERVICAL SPINE FINDINGS Alignment: Normal. Skull base and vertebrae: No acute fracture. No primary bone lesion or focal pathologic process. Soft tissues and spinal canal: No prevertebral fluid or swelling. No visible canal hematoma. Disc levels: Moderate to marked loss of disc space noted from C3-C4 through the upper thoracic spine associated with endplate spurring and varying degrees of disc bulging. There are facet degenerative changes, less pronounced, most evident on the right at C2-C3. No convincing disc herniation. Upper chest: Enlarged heterogeneous thyroid gland. No acute findings. Other: None. IMPRESSION: HEAD CT 1. Small amount of subarachnoid hemorrhage over the anterior left frontal lobe. 2. No other acute  intracranial abnormality. 3. No skull fracture. ORBITAL CT 1. Comminuted depressed right orbital floor  fractures, with a fracture also involving the adjacent anterior inferior right medial orbital wall. 2. Right-sided exophthalmos due to postseptal intra and extraconal orbital hemorrhage. Right globe appears intact. CERVICAL CT 1. No fracture or acute finding. 2. Enlarged heterogeneous thyroid gland. Recommend nonemergent/urgent thyroid ultrasound (ref: J Am Coll Radiol. 2015 Feb;12(2): 143-50). Critical Value/emergent results were called by telephone at the time of interpretation on 08/18/2020 at 8:54 am to provider [facility] , who verbally acknowledged these results. Electronically Signed   By: Lajean Manes M.D.   On: 08/18/2020 08:54   DG HIP UNILAT WITH PELVIS 2-3 VIEWS RIGHT  Result Date: 08/18/2020 CLINICAL DATA:  [place] [address] at home. Patient fell over her walker. Complaining of right-sided pain. EXAM: DG HIP (WITH OR WITHOUT PELVIS) 2-3V RIGHT COMPARISON:  12/27/2015. FINDINGS: No acute fracture.  Old fracture of the left inferior pubic ramus. Bilateral total hip arthroplasties appear well seated and aligned. SI joints and symphysis pubis are normally spaced and aligned. Skeletal structures are diffusely demineralized. Soft tissues are unremarkable. IMPRESSION: 1. No acute fracture or dislocation. No evidence of loosening of either hip arthroplasty. Electronically Signed   By: Lajean Manes M.D.   On: 08/18/2020 08:08   CT OrbitsS W/O CM  Result Date: 08/18/2020 CLINICAL DATA:  Patient fell striking the side of her face on a dresser. EXAM: CT HEAD WITHOUT CONTRAST CT ORBITS WITHOUT CONTRAST CT CERVICAL SPINE WITHOUT CONTRAST TECHNIQUE: Multidetector CT imaging of the head, cervical spine, and maxillofacial structures were performed using the standard protocol without intravenous contrast. Multiplanar CT image reconstructions of the cervical spine and maxillofacial structures were also generated. COMPARISON:  None. FINDINGS: CT HEAD FINDINGS Brain: Small amount of subarachnoid hemorrhage lies along the  anterior left frontal lobe. No other intracranial hemorrhage. Ventricles are normal in size, for the patient's age, and normal in configuration. There are no parenchymal masses or mass effect. There is no evidence of ischemic infarction. Mild hypoattenuation lines the periventricular white matter consistent with chronic microvascular ischemic change. No extra-axial masses. Vascular: No hyperdense vessel or unexpected calcification. Skull: No skull fracture. Other: None. CT ORBIT FINDINGS Osseous: Comminuted, displaced and inferiorly depressed fractures of the right orbital floor, depressed into the maxillary sinus by 1.1 cm. Fractures extend from just posterior to the inferior orbital rim posteriorly to near the orbital apex. There is a nondisplaced fracture of the anterior, inferior medial orbital wall on the right. No other fractures.  No bone lesions. Orbits: Right-sided exophthalmos. There is hemorrhage in the right postseptal orbit, intraconal as well as extraconal spaces. The right globe is intact. The extraocular muscles and optic nerve appear intact. Normal left globe and orbit. Sinuses: Right maxillary sinus is opacified with hemorrhage. Mild mucosal thickening lines multiple right ethmoid air cells. Remaining sinuses and the mastoid air cells are clear. Soft tissues: Right periorbital soft tissue swelling/hemorrhage. CT CERVICAL SPINE FINDINGS Alignment: Normal. Skull base and vertebrae: No acute fracture. No primary bone lesion or focal pathologic process. Soft tissues and spinal canal: No prevertebral fluid or swelling. No visible canal hematoma. Disc levels: Moderate to marked loss of disc space noted from C3-C4 through the upper thoracic spine associated with endplate spurring and varying degrees of disc bulging. There are facet degenerative changes, less pronounced, most evident on the right at C2-C3. No convincing disc herniation. Upper chest: Enlarged heterogeneous thyroid gland. No acute findings.  Other: None. IMPRESSION: HEAD CT  1. Small amount of subarachnoid hemorrhage over the anterior left frontal lobe. 2. No other acute intracranial abnormality. 3. No skull fracture. ORBITAL CT 1. Comminuted depressed right orbital floor fractures, with a fracture also involving the adjacent anterior inferior right medial orbital wall. 2. Right-sided exophthalmos due to postseptal intra and extraconal orbital hemorrhage. Right globe appears intact. CERVICAL CT 1. No fracture or acute finding. 2. Enlarged heterogeneous thyroid gland. Recommend nonemergent/urgent thyroid ultrasound (ref: J Am Coll Radiol. 2015 Feb;12(2): 143-50). Critical Value/emergent results were called by telephone at the time of interpretation on 08/18/2020 at 8:54 am to provider [facility] , who verbally acknowledged these results. Electronically Signed   By: Lajean Manes M.D.   On: 08/18/2020 08:54  Physical Exam Constitutional:      General: She is in acute distress.     Appearance: She is normal weight.  HENT:     Head: Normocephalic.     Comments: Bruising over right eye with edema, periorbital edema right     Right Ear: External ear normal.     Left Ear: External ear normal.     Nose: Nose normal.     Mouth/Throat:     Mouth: Mucous membranes are moist.     Pharynx: Oropharynx is clear.  Eyes:     Conjunctiva/sclera: Conjunctivae normal.  Cardiovascular:     Rate and Rhythm: Normal rate and regular rhythm.     Pulses: Normal pulses.  Pulmonary:     Effort: Pulmonary effort is normal.  Abdominal:     General: Abdomen is flat.  Musculoskeletal:        General: Normal range of motion.     Cervical back: Normal range of motion.     Comments: Pain in right upper extremity, wrist especially  Skin:    General: Skin is warm and dry.     Findings: Bruising present.  Neurological:     General: No focal deficit present.     Mental Status: She is alert.  Psychiatric:        Mood and Affect: Mood normal.         Behavior: Behavior normal.        Thought Content: Thought content normal.        Judgment: Judgment normal.    A/p Sydney [name] is a 87 y.o. female With a minimal punctate traumatic ich, and sah with no mass effect. No repeat ct needed. No further neurosurgical care unless exam changes.

## 2020-08-18 NOTE — Consult Note (Signed)
Reason for consult:  HPI: Sydney [name] is an 87 y.o. female we are asked to see for ocular injury OD.  [name] fell at home this morning around 0600.  She struck her dresser.   The patient was brought the [facility] and found on imaging to have a retrobulbar hemorrhage OD.  Currently the patient endorses blurred vision OD and pain in and around OD.   No other ocular complaints.     POH:  Cataract surgery OU.    Past Medical History:  Diagnosis Date  . GERD (gastroesophageal reflux disease)   . Hx of lumbosacral spine surgery t  . Hyperlipidemia   . Hypertension   . Iliac artery aneurysm (HCC)   . Lumbar spinal stenosis   . Polymyalgia rheumatica (HCC) 06/2016   Past Surgical History:  Procedure Laterality Date  . JOINT REPLACEMENT  1992   right hip replacement  . JOINT REPLACEMENT  2002   left hip replacement  . ROTATOR CUFF REPAIR  2002   Left shoulder  . SPINE SURGERY  2006 & 2010   posterior spinal fusion L4-5 L5S1   Family History  Problem Relation Age of Onset  . Heart disease Mother   . Heart disease Father        Heart Disease before age 74  . Heart attack Father   . Hyperlipidemia Father   . Heart disease Sister   . Diabetes Sister   . Heart disease Brother   . Hyperlipidemia Brother   . Heart disease Brother   . Cancer Brother    Current Facility-Administered Medications  Medication Dose Route Frequency Provider Last Rate Last Admin  . hydrALAZINE (APRESOLINE) injection 10 mg  10 mg Intravenous Once Arby Barrette, MD      . lactated ringers infusion   Intravenous Continuous Arby Barrette, MD 125 mL/hr at 08/18/20 0905 New Bag at 08/18/20 0905  . morphine 2 MG/ML injection 2 mg  2 mg Intravenous Q2H PRN Arby Barrette, MD   2 mg at 08/18/20 0903  . ondansetron (ZOFRAN) injection 4 mg  4 mg Intravenous Q4H PRN Arby Barrette, MD   4 mg at 08/18/20 1950   Current Outpatient Medications  Medication Sig Dispense Refill  . acetaminophen (TYLENOL) 500 MG  tablet Take 500 mg by mouth as needed for mild pain.     Marland Kitchen aspirin EC 81 MG tablet Take 81 mg by mouth daily. Swallow whole.    . calcium carbonate (OS-CAL - DOSED IN MG OF ELEMENTAL CALCIUM) 1250 (500 Ca) MG tablet Take 1 tablet (500 mg of elemental calcium total) by mouth 2 (two) times daily with a meal. 60 tablet 0  . furosemide (LASIX) 20 MG tablet Take 2 tablets (40 mg total) by mouth daily as needed. For morning leg swelling 1 tablet 0  . nadolol (CORGARD) 40 MG tablet TAKE 1 TABLET BY MOUTH EVERY DAY 90 tablet 3  . pantoprazole (PROTONIX) 40 MG tablet Take 1 tablet (40 mg total) by mouth daily. 90 tablet 3  . risedronate (ACTONEL) 150 MG tablet Take 1 tablet (150 mg total) by mouth every 30 (thirty) days. with water on empty stomach, nothing by mouth or lie down for next 30 minutes. 3 tablet 3   Allergies  Allergen Reactions  . Lipitor [Atorvastatin]     Dizzy ( fuzziness)  . Latex Rash   Social History   Socioeconomic History  . Marital status: Widowed    Spouse name: Not on file  .  Number of children: Not on file  . Years of education: Not on file  . Highest education level: Not on file  Occupational History  . Not on file  Tobacco Use  . Smoking status: Never Smoker  . Smokeless tobacco: Never Used  Substance and Sexual Activity  . Alcohol use: No    Alcohol/week: 0.0 standard drinks  . Drug use: No  . Sexual activity: Not on file  Other Topics Concern  . Not on file  Social History Narrative   Has living will   Daughter [name] is health care POA (then son [name])   Discussed DNR--she requests (done 11/30/12)   No tube feedings if cognitively unaware   Social Determinants of Health   Financial Resource Strain: Not on file  Food Insecurity: Not on file  Transportation Needs: Not on file  Physical Activity: Not on file  Stress: Not on file  Social Connections: Not on file  Intimate Partner Violence: Not on file    Review of systems: ROS Per ED note,  reviewed.   Physical Exam:  Blood pressure (!) 141/65, pulse (!) 51, temperature 97.6 F (36.4 C), temperature source Oral, resp. rate 14, SpO2 100 %.   [facility] [place] @ intermediate with near card. OD 20/100   OS  20/30-  Pupils:   Minimal reaction OU; no RAPD OU  IOP (T pen)  OD 23   OS  13  Repeat IOP check 30 mins later  OD 20  CVF: OD full to CF   OS full to CF  Motility:  OD -2 all directions  OS full ductions     Bedside lighted examination:                                 OD          There is relative proptosis; mild (not firm) resistance to retropulsion of the gobe.                               External/adnexa: 2+ periocular echymosis and edema                                     Lids/lashes:        Echymotic, edema                                     Conjunctiva        Chemosis, subconj heme        Cornea:              Clear                  AC:                     Deep, quiet                                Iris:                     Normal        Lens:  Clear                                       OS                                       External/adnexa: Normal                                      Lids/lashes:        Normal                                      Conjunctiva        White, quiet        Cornea:              Clear                  AC:                     Deep, quiet                                Iris:                     Normal        Lens:                  Clear       Dilated fundus exam: (Neo 2.5; Myd 1%)      OD Vitreous            Clear, quiet                                Optic Disc:       Normal, perfused                      Macula:             Flat                                            Vessels:           Normal caliber,distribution         Periphery:         Flat, attached                                      OS Vitreous            Clear, quiet                                Optic Disc:       Normal, perfused  Macula:             Flat                                            Vessels:           Normal caliber,distribution         Periphery:         Flat, attached        Labs/studies: Results for orders placed or performed during the hospital encounter of 08/18/20 (from the past 48 hour(s))  Comprehensive metabolic panel     Status: Abnormal   Collection Time: 08/18/20  8:37 AM  Result Value Ref Range   Sodium 140 135 - 145 mmol/L   Potassium 4.3 3.5 - 5.1 mmol/L   Chloride 108 98 - 111 mmol/L   CO2 21 (L) 22 - 32 mmol/L   Glucose, Bld 135 (H) 70 - 99 mg/dL    Comment: Glucose reference range applies only to samples taken after fasting for at least 8 hours.   BUN 13 8 - 23 mg/dL   Creatinine, Ser 0.89 0.44 - 1.00 mg/dL   Calcium 9.6 8.9 - 10.3 mg/dL   Total Protein 6.5 6.5 - 8.1 g/dL   Albumin 3.8 3.5 - 5.0 g/dL   AST 23 15 - 41 U/L   ALT 13 0 - 44 U/L   Alkaline Phosphatase 41 38 - 126 U/L   Total Bilirubin 1.2 0.3 - 1.2 mg/dL   GFR, Estimated >60 >60 mL/min    Comment: (NOTE) Calculated using the CKD-EPI Creatinine Equation (2021)    Anion gap 11 5 - 15    Comment: Performed at [facility] [address], [place], [place] [address]  CBC with Differential     Status: Abnormal   Collection Time: 08/18/20  8:37 AM  Result Value Ref Range   WBC 9.2 4.0 - 10.5 K/uL   RBC 4.96 3.87 - 5.11 MIL/uL   Hemoglobin 15.2 (H) 12.0 - 15.0 g/dL   HCT 46.4 (H) 36.0 - 46.0 %   MCV 93.5 80.0 - 100.0 fL   MCH 30.6 26.0 - 34.0 pg   MCHC 32.8 30.0 - 36.0 g/dL   RDW 13.2 11.5 - 15.5 %   Platelets 198 150 - 400 K/uL   nRBC 0.0 0.0 - 0.2 %   Neutrophils Relative % 80 %   Neutro Abs 7.3 1.7 - 7.7 K/uL   Lymphocytes Relative 12 %   Lymphs Abs 1.1 0.7 - 4.0 K/uL   Monocytes Relative 6 %   Monocytes Absolute 0.6 0.1 - 1.0 K/uL   Eosinophils Relative 1 %   Eosinophils Absolute 0.1 0.0 - 0.5 K/uL   Basophils Relative 0 %   Basophils Absolute 0.0 0.0 - 0.1 K/uL   Immature  Granulocytes 1 %   Abs Immature Granulocytes 0.05 0.00 - 0.07 K/uL    Comment: Performed at [facility] [address] [place], [place] [address]  Protime-INR     Status: None   Collection Time: 08/18/20  8:37 AM  Result Value Ref Range   Prothrombin Time 13.8 11.4 - 15.2 seconds   INR 1.1 0.8 - 1.2    Comment: (NOTE) INR goal varies based on device and disease states. Performed at [facility], [place] [address], [place], [place] [address]  I-stat chem 8, ED (not at [facility] or [facility])     Status: Abnormal   Collection Time: 08/18/20  8:52 AM  Result Value Ref Range   Sodium 141 135 - 145 mmol/L   Potassium 4.2 3.5 - 5.1 mmol/L   Chloride 110 98 - 111 mmol/L   BUN 14 8 - 23 mg/dL   Creatinine, Ser 0.80 0.44 - 1.00 mg/dL   Glucose, Bld 135 (H) 70 - 99 mg/dL    Comment: Glucose reference range applies only to samples taken after fasting for at least 8 hours.   Calcium, Ion 1.11 (L) 1.15 - 1.40 mmol/L   TCO2 23 22 - 32 mmol/L   Hemoglobin 15.0 12.0 - 15.0 g/dL   HCT 44.0 36.0 - 46.0 %  Type and screen [place]     Status: None   Collection Time: 08/18/20  8:54 AM  Result Value Ref Range   ABO/RH(D) A NEG    Antibody Screen NEG    Sample Expiration      08/21/2020,2359 Performed at [facility] [address] [place], [place] [address]   Resp Panel by RT-PCR (Flu A&B, Covid) Nasopharyngeal Swab     Status: None   Collection Time: 08/18/20  9:11 AM   Specimen: Nasopharyngeal Swab; Nasopharyngeal(NP) swabs in vial transport medium  Result Value Ref Range   SARS Coronavirus 2 by RT PCR NEGATIVE NEGATIVE    Comment: (NOTE) SARS-CoV-2 target nucleic acids are NOT DETECTED.  The SARS-CoV-2 RNA is generally detectable in upper respiratory specimens during the acute phase of infection. The lowest concentration of SARS-CoV-2 viral copies this assay can detect is 138 copies/mL. A negative result does not preclude SARS-Cov-2 infection and should not  be used as the sole basis for treatment or other patient management decisions. A negative result may occur with  improper specimen collection/handling, submission of specimen other than nasopharyngeal swab, presence of viral mutation(s) within the areas targeted by this assay, and inadequate number of viral copies(<138 copies/mL). A negative result must be combined with clinical observations, patient history, and epidemiological information. The expected result is Negative.  Fact Sheet for Patients:  EntrepreneurPulse.com.au  Fact Sheet for Healthcare Providers:  IncredibleEmployment.be  This test is no t yet approved or cleared by the [place] FDA and  has been authorized for detection and/or diagnosis of SARS-CoV-2 by FDA under an Emergency Use Authorization (EUA). This EUA will remain  in effect (meaning this test can be used) for the duration of the COVID-19 declaration under Section 564(b)(1) of the Act, 21 U.S.C.section 360bbb-3(b)(1), unless the authorization is terminated  or revoked sooner.       Influenza A by PCR NEGATIVE NEGATIVE   Influenza B by PCR NEGATIVE NEGATIVE    Comment: (NOTE) The Xpert Xpress SARS-CoV-2/FLU/RSV plus assay is intended as an aid in the diagnosis of influenza from Nasopharyngeal swab specimens and should not be used as a sole basis for treatment. Nasal washings and aspirates are unacceptable for Xpert Xpress SARS-CoV-2/FLU/RSV testing.  Fact Sheet for Patients: EntrepreneurPulse.com.au  Fact Sheet for Healthcare Providers: IncredibleEmployment.be  This test is not yet approved or cleared by the [place] FDA and has been authorized for detection and/or diagnosis of SARS-CoV-2 by FDA under an Emergency Use Authorization (EUA). This EUA will remain in effect (meaning this test can be used) for the duration of the COVID-19 declaration under Section 564(b)(1) of  the Act, 21 U.S.C. section 360bbb-3(b)(1), unless the authorization is  terminated or revoked.  Performed at [facility], [place] [address], [place],  [address]    DG Wrist Complete Right  Result Date: 08/18/2020 CLINICAL DATA:  [place] at home. Patient fell over her walker. Right wrist pain. EXAM: RIGHT WRIST - COMPLETE 3+ VIEW COMPARISON:  None. FINDINGS: There is a subtle nondisplaced fracture of the distal radius, extending transversely across the metaphysis with evidence of a fracture line also intersecting the articular surface of the lunate facet. No other fractures. Widened scapholunate interval consistent with a disrupted scapholunate ligament, most likely chronic. Osteoarthritic changes at the scaphoid trapezium trapezoid articulation and the trapezium first metacarpal articulation with joint space narrowing, subchondral sclerosis and marginal osteophytes. Chondrocalcinosis noted along the triangular fibrocartilage complex. Mild dorsal soft tissue swelling. IMPRESSION: 1. Subtle nondisplaced fracture of the distal radial metaphysis with an intra-articular component. 2. No dislocation. 3. Disrupted scapholunate ligament, likely chronic. Electronically Signed   By: Lajean Manes M.D.   On: 08/18/2020 08:10   CT Head Wo Contrast  Result Date: 08/18/2020 CLINICAL DATA:  Patient fell striking the side of her face on a dresser. EXAM: CT HEAD WITHOUT CONTRAST CT ORBITS WITHOUT CONTRAST CT CERVICAL SPINE WITHOUT CONTRAST TECHNIQUE: Multidetector CT imaging of the head, cervical spine, and maxillofacial structures were performed using the standard protocol without intravenous contrast. Multiplanar CT image reconstructions of the cervical spine and maxillofacial structures were also generated. COMPARISON:  None. FINDINGS: CT HEAD FINDINGS Brain: Small amount of subarachnoid hemorrhage lies along the anterior left frontal lobe. No other intracranial hemorrhage. Ventricles are normal in size, for  the patient's age, and normal in configuration. There are no parenchymal masses or mass effect. There is no evidence of ischemic infarction. Mild hypoattenuation lines the periventricular white matter consistent with chronic microvascular ischemic change. No extra-axial masses. Vascular: No hyperdense vessel or unexpected calcification. Skull: No skull fracture. Other: None. CT ORBIT FINDINGS Osseous: Comminuted, displaced and inferiorly depressed fractures of the right orbital floor, depressed into the maxillary sinus by 1.1 cm. Fractures extend from just posterior to the inferior orbital rim posteriorly to near the orbital apex. There is a nondisplaced fracture of the anterior, inferior medial orbital wall on the right. No other fractures.  No bone lesions. Orbits: Right-sided exophthalmos. There is hemorrhage in the right postseptal orbit, intraconal as well as extraconal spaces. The right globe is intact. The extraocular muscles and optic nerve appear intact. Normal left globe and orbit. Sinuses: Right maxillary sinus is opacified with hemorrhage. Mild mucosal thickening lines multiple right ethmoid air cells. Remaining sinuses and the mastoid air cells are clear. Soft tissues: Right periorbital soft tissue swelling/hemorrhage. CT CERVICAL SPINE FINDINGS Alignment: Normal. Skull base and vertebrae: No acute fracture. No primary bone lesion or focal pathologic process. Soft tissues and spinal canal: No prevertebral fluid or swelling. No visible canal hematoma. Disc levels: Moderate to marked loss of disc space noted from C3-C4 through the upper thoracic spine associated with endplate spurring and varying degrees of disc bulging. There are facet degenerative changes, less pronounced, most evident on the right at C2-C3. No convincing disc herniation. Upper chest: Enlarged heterogeneous thyroid gland. No acute findings. Other: None. IMPRESSION: HEAD CT 1. Small amount of subarachnoid hemorrhage over the anterior  left frontal lobe. 2. No other acute intracranial abnormality. 3. No skull fracture. ORBITAL CT 1. Comminuted depressed right orbital floor fractures, with a fracture also involving the adjacent anterior inferior right medial orbital wall. 2. Right-sided exophthalmos due to postseptal intra  and extraconal orbital hemorrhage. Right globe appears intact. CERVICAL CT 1. No fracture or acute finding. 2. Enlarged heterogeneous thyroid gland. Recommend nonemergent/urgent thyroid ultrasound (ref: J Am Coll Radiol. 2015 Feb;12(2): 143-50). Critical Value/emergent results were called by telephone at the time of interpretation on 08/18/2020 at 8:54 am to provider [facility] , who verbally acknowledged these results. Electronically Signed   By: Amie Portland M.D.   On: 08/18/2020 08:54   CT Cervical Spine Wo Contrast  Result Date: 08/18/2020 CLINICAL DATA:  Patient fell striking the side of her face on a dresser. EXAM: CT HEAD WITHOUT CONTRAST CT ORBITS WITHOUT CONTRAST CT CERVICAL SPINE WITHOUT CONTRAST TECHNIQUE: Multidetector CT imaging of the head, cervical spine, and maxillofacial structures were performed using the standard protocol without intravenous contrast. Multiplanar CT image reconstructions of the cervical spine and maxillofacial structures were also generated. COMPARISON:  None. FINDINGS: CT HEAD FINDINGS Brain: Small amount of subarachnoid hemorrhage lies along the anterior left frontal lobe. No other intracranial hemorrhage. Ventricles are normal in size, for the patient's age, and normal in configuration. There are no parenchymal masses or mass effect. There is no evidence of ischemic infarction. Mild hypoattenuation lines the periventricular white matter consistent with chronic microvascular ischemic change. No extra-axial masses. Vascular: No hyperdense vessel or unexpected calcification. Skull: No skull fracture. Other: None. CT ORBIT FINDINGS Osseous: Comminuted, displaced and inferiorly depressed  fractures of the right orbital floor, depressed into the maxillary sinus by 1.1 cm. Fractures extend from just posterior to the inferior orbital rim posteriorly to near the orbital apex. There is a nondisplaced fracture of the anterior, inferior medial orbital wall on the right. No other fractures.  No bone lesions. Orbits: Right-sided exophthalmos. There is hemorrhage in the right postseptal orbit, intraconal as well as extraconal spaces. The right globe is intact. The extraocular muscles and optic nerve appear intact. Normal left globe and orbit. Sinuses: Right maxillary sinus is opacified with hemorrhage. Mild mucosal thickening lines multiple right ethmoid air cells. Remaining sinuses and the mastoid air cells are clear. Soft tissues: Right periorbital soft tissue swelling/hemorrhage. CT CERVICAL SPINE FINDINGS Alignment: Normal. Skull base and vertebrae: No acute fracture. No primary bone lesion or focal pathologic process. Soft tissues and spinal canal: No prevertebral fluid or swelling. No visible canal hematoma. Disc levels: Moderate to marked loss of disc space noted from C3-C4 through the upper thoracic spine associated with endplate spurring and varying degrees of disc bulging. There are facet degenerative changes, less pronounced, most evident on the right at C2-C3. No convincing disc herniation. Upper chest: Enlarged heterogeneous thyroid gland. No acute findings. Other: None. IMPRESSION: HEAD CT 1. Small amount of subarachnoid hemorrhage over the anterior left frontal lobe. 2. No other acute intracranial abnormality. 3. No skull fracture. ORBITAL CT 1. Comminuted depressed right orbital floor fractures, with a fracture also involving the adjacent anterior inferior right medial orbital wall. 2. Right-sided exophthalmos due to postseptal intra and extraconal orbital hemorrhage. Right globe appears intact. CERVICAL CT 1. No fracture or acute finding. 2. Enlarged heterogeneous thyroid gland. Recommend  nonemergent/urgent thyroid ultrasound (ref: J Am Coll Radiol. 2015 Feb;12(2): 143-50). Critical Value/emergent results were called by telephone at the time of interpretation on 08/18/2020 at 8:54 am to provider [facility] , who verbally acknowledged these results. Electronically Signed   By: Amie Portland M.D.   On: 08/18/2020 08:54   DG HIP UNILAT WITH PELVIS 2-3 VIEWS RIGHT  Result Date: 08/18/2020 CLINICAL DATA:  [place] at home. Patient  fell over her walker. Complaining of right-sided pain. EXAM: DG HIP (WITH OR WITHOUT PELVIS) 2-3V RIGHT COMPARISON:  12/27/2015. FINDINGS: No acute fracture.  Old fracture of the left inferior pubic ramus. Bilateral total hip arthroplasties appear well seated and aligned. SI joints and symphysis pubis are normally spaced and aligned. Skeletal structures are diffusely demineralized. Soft tissues are unremarkable. IMPRESSION: 1. No acute fracture or dislocation. No evidence of loosening of either hip arthroplasty. Electronically Signed   By: Amie Portlandavid  Ormond M.D.   On: 08/18/2020 08:08   CT OrbitsS W/O CM  Result Date: 08/18/2020 CLINICAL DATA:  Patient fell striking the side of her face on a dresser. EXAM: CT HEAD WITHOUT CONTRAST CT ORBITS WITHOUT CONTRAST CT CERVICAL SPINE WITHOUT CONTRAST TECHNIQUE: Multidetector CT imaging of the head, cervical spine, and maxillofacial structures were performed using the standard protocol without intravenous contrast. Multiplanar CT image reconstructions of the cervical spine and maxillofacial structures were also generated. COMPARISON:  None. FINDINGS: CT HEAD FINDINGS Brain: Small amount of subarachnoid hemorrhage lies along the anterior left frontal lobe. No other intracranial hemorrhage. Ventricles are normal in size, for the patient's age, and normal in configuration. There are no parenchymal masses or mass effect. There is no evidence of ischemic infarction. Mild hypoattenuation lines the periventricular white matter consistent with  chronic microvascular ischemic change. No extra-axial masses. Vascular: No hyperdense vessel or unexpected calcification. Skull: No skull fracture. Other: None. CT ORBIT FINDINGS Osseous: Comminuted, displaced and inferiorly depressed fractures of the right orbital floor, depressed into the maxillary sinus by 1.1 cm. Fractures extend from just posterior to the inferior orbital rim posteriorly to near the orbital apex. There is a nondisplaced fracture of the anterior, inferior medial orbital wall on the right. No other fractures.  No bone lesions. Orbits: Right-sided exophthalmos. There is hemorrhage in the right postseptal orbit, intraconal as well as extraconal spaces. The right globe is intact. The extraocular muscles and optic nerve appear intact. Normal left globe and orbit. Sinuses: Right maxillary sinus is opacified with hemorrhage. Mild mucosal thickening lines multiple right ethmoid air cells. Remaining sinuses and the mastoid air cells are clear. Soft tissues: Right periorbital soft tissue swelling/hemorrhage. CT CERVICAL SPINE FINDINGS Alignment: Normal. Skull base and vertebrae: No acute fracture. No primary bone lesion or focal pathologic process. Soft tissues and spinal canal: No prevertebral fluid or swelling. No visible canal hematoma. Disc levels: Moderate to marked loss of disc space noted from C3-C4 through the upper thoracic spine associated with endplate spurring and varying degrees of disc bulging. There are facet degenerative changes, less pronounced, most evident on the right at C2-C3. No convincing disc herniation. Upper chest: Enlarged heterogeneous thyroid gland. No acute findings. Other: None. IMPRESSION: HEAD CT 1. Small amount of subarachnoid hemorrhage over the anterior left frontal lobe. 2. No other acute intracranial abnormality. 3. No skull fracture. ORBITAL CT 1. Comminuted depressed right orbital floor fractures, with a fracture also involving the adjacent anterior inferior right  medial orbital wall. 2. Right-sided exophthalmos due to postseptal intra and extraconal orbital hemorrhage. Right globe appears intact. CERVICAL CT 1. No fracture or acute finding. 2. Enlarged heterogeneous thyroid gland. Recommend nonemergent/urgent thyroid ultrasound (ref: J Am Coll Radiol. 2015 Feb;12(2): 143-50). Critical Value/emergent results were called by telephone at the time of interpretation on 08/18/2020 at 8:54 am to [facility] , who verbally acknowledged these results. Electronically Signed   By: Amie Portlandavid  Ormond M.D.   On: 08/18/2020 08:54  Assessment and Plan: Sydney [name] is an 87 y.o. female we are asked to see for ocular injury OD with:   -- Retrobulbar hemorrhage OD.   -- Multiple orbital fractures OD.   Recommend:   -- Monitoring of IOP OD - no need for acute canthotomy given IOP of 23, 20, no RAPD, etc.    -- Elevation of HOB, cool compresses OD.  -- Follow up Monday at [facility].  -- Contact facial trauma team for dispo of fractures.    All of the above information was relayed to the patient and/or patient family.  Ophthalmic warning signs and symptoms were reviewed, and clear instructions for immediate phone contact and/or immediate return to the ED or clinic were provided should any of these signs or symptoms occur.  Follow up contact information was provided.  All questions were answered.   Antony Contras 08/18/2020, 10:35 AM  [facility] Ophthalmology [phone]

## 2020-08-18 NOTE — ED Provider Notes (Signed)
[facility] EMERGENCY DEPARTMENT Provider Note   CSN: [id number] Arrival date & time: 08/18/20  [id number]     History Chief Complaint  Patient presents with  . Fall    Sydney [name] is a 87 y.o. female.  HPI Patient had a fall at home.  She tripped using her walker.  She struck the edge of her face on a dresser.  She has swelling and pain around the right eye.  She reports initially the vision was blurry in the right eye but has since improved significantly.  No double vision.  She reports it is painful around the eye but does not have generalized headache.  No loss of consciousness.  Patient is not on blood thinners.  She reports she has some mild pain to the right side of her neck.  No numbness or tingling into the extremities.  She also has pain deep in the groin on the right.  She can move the hip without significant difficulty.  No numbness or tingling to the leg.  No chest pain or shortness of breath.  No abdominal pain.  Also mild pain to the right wrist but can move without difficulty.    Past Medical History:  Diagnosis Date  . GERD (gastroesophageal reflux disease)   . Hx of lumbosacral spine surgery t  . Hyperlipidemia   . Hypertension   . Iliac artery aneurysm ([place])   . Lumbar spinal stenosis   . Polymyalgia rheumatica ([place]) 06/2016    Patient Active Problem List   Diagnosis Date Noted  . Fall 08/18/2020  . Tinnitus 08/08/2020  . Stage 3a chronic kidney disease ([place]) 03/30/2020  . Osteoporosis 09/02/2019  . Polymyalgia rheumatica ([place]) 06/12/2016  . Hyperlipemia 07/23/2015  . Generalized osteoarthritis of multiple sites 07/14/2014  . Advanced directives, counseling/discussion 12/02/2013  . Routine general medical examination at a health care facility 11/30/2012  . GERD (gastroesophageal reflux disease)   . Iliac artery aneurysm, right ([place]) 01/18/2011  . Obesity, unspecified 01/02/2009  . DEGENERATIVE DISC DISEASE, LUMBAR SPINE 01/02/2009  .  Essential hypertension, benign 01/14/2007    Past Surgical History:  Procedure Laterality Date  . JOINT REPLACEMENT  1992   right hip replacement  . JOINT REPLACEMENT  2002   left hip replacement  . ROTATOR CUFF REPAIR  2002   Left shoulder  . SPINE SURGERY  2006 & 2010   posterior spinal fusion L4-5 L5S1     OB History   No obstetric history on file.     Family History  Problem Relation Age of Onset  . Heart disease Mother   . Heart disease Father        Heart Disease before age 40  . Heart attack Father   . Hyperlipidemia Father   . Heart disease Sister   . Diabetes Sister   . Heart disease Brother   . Hyperlipidemia Brother   . Heart disease Brother   . Cancer Brother     Social History   Tobacco Use  . Smoking status: Never Smoker  . Smokeless tobacco: Never Used  Substance Use Topics  . Alcohol use: No    Alcohol/week: 0.0 standard drinks  . Drug use: No    Home Medications Prior to Admission medications   Medication Sig Start Date End Date Taking? Authorizing Provider  acetaminophen (TYLENOL) 500 MG tablet Take 500 mg by mouth as needed for mild pain.     [provider]  aspirin EC 81 MG tablet  Take 81 mg by mouth daily. Swallow whole.    [provider]  calcium carbonate (OS-CAL - DOSED IN MG OF ELEMENTAL CALCIUM) 1250 (500 Ca) MG tablet Take 1 tablet (500 mg of elemental calcium total) by mouth 2 (two) times daily with a meal. 09/06/19 10/06/19  Donne Hazel, MD  furosemide (LASIX) 20 MG tablet Take 2 tablets (40 mg total) by mouth daily as needed. For morning leg swelling 03/30/20   Viviana Simpler I, MD  nadolol (CORGARD) 40 MG tablet TAKE 1 TABLET BY MOUTH EVERY DAY 12/20/19   Viviana Simpler I, MD  pantoprazole (PROTONIX) 40 MG tablet Take 1 tablet (40 mg total) by mouth daily. 03/30/20   Venia Carbon, MD  risedronate (ACTONEL) 150 MG tablet Take 1 tablet (150 mg total) by mouth every 30 (thirty) days. with water on empty  stomach, nothing by mouth or lie down for next 30 minutes. 09/28/19   Venia Carbon, MD    Allergies    Lipitor [atorvastatin] and Latex  Review of Systems   Review of Systems 10 systems reviewed and negative except as per HPI Physical Exam Updated Vital Signs BP 116/60   Pulse (!) 53   Temp 97.6 F (36.4 C) (Oral)   Resp 14   SpO2 100%   Physical Exam Constitutional:      Comments: Alert nontoxic clear ental status.  GCS 15.  No respiratory distress.  HENT:     Head:     Comments: Moderate swelling around the right eye.  Slight ecchymosis developing to the medial aspect.  No other trauma to the head or face.    Nose: Nose normal.     Mouth/Throat:     Mouth: Mucous membranes are moist.     Pharynx: Oropharynx is clear.  Eyes:     Comments: Right eye is mildly injected.  The ocular motions are normal.  Pupils are symmetric and responsive.  Moderate swelling around the right eye but patient can still spontaneously open and perform range of motion.  Neck:     Comments: Mild to moderate pain to the right just paraspinous area around C3-C5. Cardiovascular:     Rate and Rhythm: Normal rate and regular rhythm.  Pulmonary:     Effort: Pulmonary effort is normal.     Breath sounds: Normal breath sounds.  Chest:     Chest wall: No tenderness.  Abdominal:     General: There is no distension.     Palpations: Abdomen is soft.     Tenderness: There is no abdominal tenderness. There is no guarding.  Musculoskeletal:     Comments: Well-healed surgical scar over the right hip.  Small approximately 4 cm ecchymoses developing over the point of the hip.  Patient has not tenderness area.  Mild reproducible tenderness in the inguinal region without any appreciable mass fullness.  Patient can flex and extend the hip without difficulty.  No other contusions abrasions to the lower extremities.  Mild pain to the right wrist but no deformity or effusion at this time.  Pain with flexion extension  range of motion.  Neurovascularly intact.  Skin:    General: Skin is warm and dry.  Neurological:     General: No focal deficit present.     Mental Status: She is oriented to person, place, and time.     Coordination: Coordination normal.  Psychiatric:        Mood and Affect: Mood normal.     ED  Results / Procedures / Treatments   Labs (all labs ordered are listed, but only abnormal results are displayed) Labs Reviewed  COMPREHENSIVE METABOLIC PANEL - Abnormal; Notable for the following components:      Result Value   CO2 21 (*)    Glucose, Bld 135 (*)    All other components within normal limits  CBC WITH DIFFERENTIAL/PLATELET - Abnormal; Notable for the following components:   Hemoglobin 15.2 (*)    HCT 46.4 (*)    All other components within normal limits  I-STAT CHEM 8, ED - Abnormal; Notable for the following components:   Glucose, Bld 135 (*)    Calcium, Ion 1.11 (*)    All other components within normal limits  RESP PANEL BY RT-PCR (FLU A&B, COVID) [id number]  PROTIME-INR  TYPE AND SCREEN    EKG EKG Interpretation  Date/Time:  Saturday Aug 18 2020 08:59:16 EDT Ventricular Rate:  51 PR Interval:    QRS Duration: 93 QT Interval:  450 QTC Calculation: 415 R Axis:   16 Text Interpretation: sinus rythm no change from previous Confirmed by Charlesetta Shanks [phone]) on 08/18/2020 2:09:31 PM   Radiology DG Wrist Complete Right  Result Date: 08/18/2020 CLINICAL DATA:  [address] at home. Patient fell over her walker. Right wrist pain. EXAM: RIGHT WRIST - COMPLETE 3+ VIEW COMPARISON:  None. FINDINGS: There is a subtle nondisplaced fracture of the distal radius, extending transversely across the metaphysis with evidence of a fracture line also intersecting the articular surface of the lunate facet. No other fractures. Widened scapholunate interval consistent with a disrupted scapholunate ligament, most likely chronic. Osteoarthritic changes at the scaphoid trapezium trapezoid  articulation and the trapezium first metacarpal articulation with joint space narrowing, subchondral sclerosis and marginal osteophytes. Chondrocalcinosis noted along the triangular fibrocartilage complex. Mild dorsal soft tissue swelling. IMPRESSION: 1. Subtle nondisplaced fracture of the distal radial metaphysis with an intra-articular component. 2. No dislocation. 3. Disrupted scapholunate ligament, likely chronic. Electronically Signed   By: Lajean Manes M.D.   On: 08/18/2020 08:10   CT Head Wo Contrast  Result Date: 08/18/2020 CLINICAL DATA:  Patient fell striking the side of her face on a dresser. EXAM: CT HEAD WITHOUT CONTRAST CT ORBITS WITHOUT CONTRAST CT CERVICAL SPINE WITHOUT CONTRAST TECHNIQUE: Multidetector CT imaging of the head, cervical spine, and maxillofacial structures were performed using the standard protocol without intravenous contrast. Multiplanar CT image reconstructions of the cervical spine and maxillofacial structures were also generated. COMPARISON:  None. FINDINGS: CT HEAD FINDINGS Brain: Small amount of subarachnoid hemorrhage lies along the anterior left frontal lobe. No other intracranial hemorrhage. Ventricles are normal in size, for the patient's age, and normal in configuration. There are no parenchymal masses or mass effect. There is no evidence of ischemic infarction. Mild hypoattenuation lines the periventricular white matter consistent with chronic microvascular ischemic change. No extra-axial masses. Vascular: No hyperdense vessel or unexpected calcification. Skull: No skull fracture. Other: None. CT ORBIT FINDINGS Osseous: Comminuted, displaced and inferiorly depressed fractures of the right orbital floor, depressed into the maxillary sinus by 1.1 cm. Fractures extend from just posterior to the inferior orbital rim posteriorly to near the orbital apex. There is a nondisplaced fracture of the anterior, inferior medial orbital wall on the right. No other fractures.  No bone  lesions. Orbits: Right-sided exophthalmos. There is hemorrhage in the right postseptal orbit, intraconal as well as extraconal spaces. The right globe is intact. The extraocular muscles and optic nerve appear intact. Normal left globe and  orbit. Sinuses: Right maxillary sinus is opacified with hemorrhage. Mild mucosal thickening lines multiple right ethmoid air cells. Remaining sinuses and the mastoid air cells are clear. Soft tissues: Right periorbital soft tissue swelling/hemorrhage. CT CERVICAL SPINE FINDINGS Alignment: Normal. Skull base and vertebrae: No acute fracture. No primary bone lesion or focal pathologic process. Soft tissues and spinal canal: No prevertebral fluid or swelling. No visible canal hematoma. Disc levels: Moderate to marked loss of disc space noted from C3-C4 through the upper thoracic spine associated with endplate spurring and varying degrees of disc bulging. There are facet degenerative changes, less pronounced, most evident on the right at C2-C3. No convincing disc herniation. Upper chest: Enlarged heterogeneous thyroid gland. No acute findings. Other: None. IMPRESSION: HEAD CT 1. Small amount of subarachnoid hemorrhage over the anterior left frontal lobe. 2. No other acute intracranial abnormality. 3. No skull fracture. ORBITAL CT 1. Comminuted depressed right orbital floor fractures, with a fracture also involving the adjacent anterior inferior right medial orbital wall. 2. Right-sided exophthalmos due to postseptal intra and extraconal orbital hemorrhage. Right globe appears intact. CERVICAL CT 1. No fracture or acute finding. 2. Enlarged heterogeneous thyroid gland. Recommend nonemergent/urgent thyroid ultrasound (ref: J Am Coll Radiol. 2015 Feb;12(2): 143-50). Critical Value/emergent results were called by telephone at the time of interpretation on 08/18/2020 at 8:54 am to provider [facility] , who verbally acknowledged these results. Electronically Signed   By: Lajean Manes M.D.    On: 08/18/2020 08:54   CT Cervical Spine Wo Contrast  Result Date: 08/18/2020 CLINICAL DATA:  Patient fell striking the side of her face on a dresser. EXAM: CT HEAD WITHOUT CONTRAST CT ORBITS WITHOUT CONTRAST CT CERVICAL SPINE WITHOUT CONTRAST TECHNIQUE: Multidetector CT imaging of the head, cervical spine, and maxillofacial structures were performed using the standard protocol without intravenous contrast. Multiplanar CT image reconstructions of the cervical spine and maxillofacial structures were also generated. COMPARISON:  None. FINDINGS: CT HEAD FINDINGS Brain: Small amount of subarachnoid hemorrhage lies along the anterior left frontal lobe. No other intracranial hemorrhage. Ventricles are normal in size, for the patient's age, and normal in configuration. There are no parenchymal masses or mass effect. There is no evidence of ischemic infarction. Mild hypoattenuation lines the periventricular white matter consistent with chronic microvascular ischemic change. No extra-axial masses. Vascular: No hyperdense vessel or unexpected calcification. Skull: No skull fracture. Other: None. CT ORBIT FINDINGS Osseous: Comminuted, displaced and inferiorly depressed fractures of the right orbital floor, depressed into the maxillary sinus by 1.1 cm. Fractures extend from just posterior to the inferior orbital rim posteriorly to near the orbital apex. There is a nondisplaced fracture of the anterior, inferior medial orbital wall on the right. No other fractures.  No bone lesions. Orbits: Right-sided exophthalmos. There is hemorrhage in the right postseptal orbit, intraconal as well as extraconal spaces. The right globe is intact. The extraocular muscles and optic nerve appear intact. Normal left globe and orbit. Sinuses: Right maxillary sinus is opacified with hemorrhage. Mild mucosal thickening lines multiple right ethmoid air cells. Remaining sinuses and the mastoid air cells are clear. Soft tissues: Right periorbital  soft tissue swelling/hemorrhage. CT CERVICAL SPINE FINDINGS Alignment: Normal. Skull base and vertebrae: No acute fracture. No primary bone lesion or focal pathologic process. Soft tissues and spinal canal: No prevertebral fluid or swelling. No visible canal hematoma. Disc levels: Moderate to marked loss of disc space noted from C3-C4 through the upper thoracic spine associated with endplate spurring and varying degrees of disc  bulging. There are facet degenerative changes, less pronounced, most evident on the right at C2-C3. No convincing disc herniation. Upper chest: Enlarged heterogeneous thyroid gland. No acute findings. Other: None. IMPRESSION: HEAD CT 1. Small amount of subarachnoid hemorrhage over the anterior left frontal lobe. 2. No other acute intracranial abnormality. 3. No skull fracture. ORBITAL CT 1. Comminuted depressed right orbital floor fractures, with a fracture also involving the adjacent anterior inferior right medial orbital wall. 2. Right-sided exophthalmos due to postseptal intra and extraconal orbital hemorrhage. Right globe appears intact. CERVICAL CT 1. No fracture or acute finding. 2. Enlarged heterogeneous thyroid gland. Recommend nonemergent/urgent thyroid ultrasound (ref: J Am Coll Radiol. 2015 Feb;12(2): 143-50). Critical Value/emergent results were called by telephone at the time of interpretation on 08/18/2020 at 8:54 am to provider [facility] , who verbally acknowledged these results. Electronically Signed   By: Lajean Manes M.D.   On: 08/18/2020 08:54   DG HIP UNILAT WITH PELVIS 2-3 VIEWS RIGHT  Result Date: 08/18/2020 CLINICAL DATA:  [address] at home. Patient fell over her walker. Complaining of right-sided pain. EXAM: DG HIP (WITH OR WITHOUT PELVIS) 2-3V RIGHT COMPARISON:  12/27/2015. FINDINGS: No acute fracture.  Old fracture of the left inferior pubic ramus. Bilateral total hip arthroplasties appear well seated and aligned. SI joints and symphysis pubis are normally spaced  and aligned. Skeletal structures are diffusely demineralized. Soft tissues are unremarkable. IMPRESSION: 1. No acute fracture or dislocation. No evidence of loosening of either hip arthroplasty. Electronically Signed   By: Lajean Manes M.D.   On: 08/18/2020 08:08   CT OrbitsS W/O CM  Result Date: 08/18/2020 CLINICAL DATA:  Patient fell striking the side of her face on a dresser. EXAM: CT HEAD WITHOUT CONTRAST CT ORBITS WITHOUT CONTRAST CT CERVICAL SPINE WITHOUT CONTRAST TECHNIQUE: Multidetector CT imaging of the head, cervical spine, and maxillofacial structures were performed using the standard protocol without intravenous contrast. Multiplanar CT image reconstructions of the cervical spine and maxillofacial structures were also generated. COMPARISON:  None. FINDINGS: CT HEAD FINDINGS Brain: Small amount of subarachnoid hemorrhage lies along the anterior left frontal lobe. No other intracranial hemorrhage. Ventricles are normal in size, for the patient's age, and normal in configuration. There are no parenchymal masses or mass effect. There is no evidence of ischemic infarction. Mild hypoattenuation lines the periventricular white matter consistent with chronic microvascular ischemic change. No extra-axial masses. Vascular: No hyperdense vessel or unexpected calcification. Skull: No skull fracture. Other: None. CT ORBIT FINDINGS Osseous: Comminuted, displaced and inferiorly depressed fractures of the right orbital floor, depressed into the maxillary sinus by 1.1 cm. Fractures extend from just posterior to the inferior orbital rim posteriorly to near the orbital apex. There is a nondisplaced fracture of the anterior, inferior medial orbital wall on the right. No other fractures.  No bone lesions. Orbits: Right-sided exophthalmos. There is hemorrhage in the right postseptal orbit, intraconal as well as extraconal spaces. The right globe is intact. The extraocular muscles and optic nerve appear intact. Normal left  globe and orbit. Sinuses: Right maxillary sinus is opacified with hemorrhage. Mild mucosal thickening lines multiple right ethmoid air cells. Remaining sinuses and the mastoid air cells are clear. Soft tissues: Right periorbital soft tissue swelling/hemorrhage. CT CERVICAL SPINE FINDINGS Alignment: Normal. Skull base and vertebrae: No acute fracture. No primary bone lesion or focal pathologic process. Soft tissues and spinal canal: No prevertebral fluid or swelling. No visible canal hematoma. Disc levels: Moderate to marked loss of disc space noted from  C3-C4 through the upper thoracic spine associated with endplate spurring and varying degrees of disc bulging. There are facet degenerative changes, less pronounced, most evident on the right at C2-C3. No convincing disc herniation. Upper chest: Enlarged heterogeneous thyroid gland. No acute findings. Other: None. IMPRESSION: HEAD CT 1. Small amount of subarachnoid hemorrhage over the anterior left frontal lobe. 2. No other acute intracranial abnormality. 3. No skull fracture. ORBITAL CT 1. Comminuted depressed right orbital floor fractures, with a fracture also involving the adjacent anterior inferior right medial orbital wall. 2. Right-sided exophthalmos due to postseptal intra and extraconal orbital hemorrhage. Right globe appears intact. CERVICAL CT 1. No fracture or acute finding. 2. Enlarged heterogeneous thyroid gland. Recommend nonemergent/urgent thyroid ultrasound (ref: J Am Coll Radiol. 2015 Feb;12(2): 143-50). Critical Value/emergent results were called by telephone at the time of interpretation on 08/18/2020 at 8:54 am to provider [facility] , who verbally acknowledged these results. Electronically Signed   By: Lajean Manes M.D.   On: 08/18/2020 08:54    Procedures Procedures  CRITICAL CARE Performed by: Charlesetta Shanks   Total critical care time: 60 minutes  Critical care time was exclusive of separately billable procedures and treating other  patients.  Critical care was necessary to treat or prevent imminent or life-threatening deterioration.  Critical care was time spent personally by me on the following activities: development of treatment plan with patient and/or surrogate as well as nursing, discussions with consultants, evaluation of patient's response to treatment, examination of patient, obtaining history from patient or surrogate, ordering and performing treatments and interventions, ordering and review of laboratory studies, ordering and review of radiographic studies, pulse oximetry and re-evaluation of patient's condition. Medications Ordered in ED Medications  lactated ringers infusion ( Intravenous New Bag/Given 08/18/20 0905)  ondansetron (ZOFRAN) injection 4 mg (4 mg Intravenous Given 08/18/20 0856)  morphine 2 MG/ML injection 2 mg (2 mg Intravenous Given 08/18/20 1155)  hydrALAZINE (APRESOLINE) injection 10 mg (0 mg Intravenous Hold 08/18/20 0936)  acetaminophen (TYLENOL) tablet 1,000 mg (1,000 mg Oral Given 08/18/20 0810)  pantoprazole (PROTONIX) injection 40 mg (40 mg Intravenous Given 08/18/20 0859)  tetracaine (PONTOCAINE) 0.5 % ophthalmic solution 2 drop (2 drops Right Eye Given by Other 08/18/20 0935)    ED Course  I have reviewed the triage vital signs and the nursing notes.  Pertinent labs & imaging results that were available during my care of the patient were reviewed by me and considered in my medical decision making (see chart for details).  Clinical Course as of 08/18/20 1410  Sat Aug 18, 2020  0852 Message left on voicemail for Dr. Prudencio Burly ophthalmology for emergency call back. [MP]  0902 Patient has had increased ocular swelling and exophthalmos since first exam.  Call has been placed to the pager number provided for ophthalmology on-call.  Second call is being placed. [MP]  [phone] Consult: Dr. Prudencio Burly has returned the page.  We reviewed the patient's exam and evolving proptosis with CT findings consistent with  hemorrhage.  He requests intraocular pressures and recontact him with measured pressure. [MP]  7616 Her ocular pressure measured with Tono-Pen affected eye right 28, normal eye left 12.  These values sent by text message to Dr. Prudencio Burly [MP]  59 Consult: Dr. Claudia Desanctis trauma ENT date aware of the patient and plan for admission.  Made aware of Optho consult. [MP]    Clinical Course User Index [MP] Charlesetta Shanks, MD   MDM Rules/Calculators/A&P  Patient rise after fall with large swelling around the right orbit.  No bulbar and intralobar hemorrhage identified.  Exam is outlined above.  Dr. Prudencio Burly has evaluated the patient and no interventional procedures needed at this time.   Patient has subarachnoid hemorrhage.  Mental status is clear.  Airway is protected.  Patient will be admitted for observation by neurosurgery  Final Clinical Impression(s) / ED Diagnoses Final diagnoses:  [facility] (subarachnoid hemorrhage) ([place])  Closed blow out fracture of orbit, initial encounter [facility])    Rx / [place] Orders ED Discharge Orders    None       Charlesetta Shanks, MD 08/28/20 1040

## 2020-08-18 NOTE — ED Triage Notes (Addendum)
Patient arrives via EMS from home due to having a mechanical fall at home. The patient fell over her walker at home when getting up this morning. Patient fell on her right side, hitting the right side of her head on a wooden dresser. arrives with c/o of right Eye, Wrist, and right Hip pain. A&ox4. Patient did have a fall last year that resulted in a non surgical pelvic fracture.   Patient does not take blood thinners and no LOC.

## 2020-08-18 NOTE — H&P (View-Only) (Signed)
Called about pt with right orbital floor fracture after fall.  Concern for retrobulbar hemorrhage on CT but evaluated by ophthalmology with no acute intervention needed.  Fracture is displaced inferiorly into the sinus along with some orbital contents.  May need operative fixation.  She is currently proptotic and swollen so will re-evaluate in a few days to make surgical plan.  If admitted we'll follow her progress.  If discharged I'll see her in clinic this week. 

## 2020-08-18 NOTE — H&P (Signed)
History and Physical    Sydney [name] [id number] DOB: 11-10-1932 DOA: 08/18/2020  PCP: Venia Carbon, MD  Patient coming from: Home  I have personally briefly reviewed patient's old medical records in [place]  Chief Complaint: Fall/right eye pain  HPI: Sydney [name] is a 87 y.o. female with medical history significant of GERD, hypertension, hyperlipidemia, PMR was brought into the emergency department after a fall.  Patient's daughter Sydney [name] is at the bedside.  History provided by both patient and her daughter.  According to the history, at around 6 AM, patient was walking around in her bathroom when suddenly her walker slipped away and she fell and hit her head to the dresser very hard.  Luckily, her daughter who is originally from [place] is living with her since past few days and she was able to help out.  Patient was then brought into the emergency department.  According to patient, she did not have any prodromal symptoms or any dizziness before the fall.  It was purely mechanical fall.  ED Course: Upon arrival to ED, patient was slightly hypertensive and bradycardic.  Multiple imaging studies were done which included right hip x-ray, right wrist x-ray both were negative for any fracture.  CT head showed small amount of subarachnoid hemorrhage, CT orbital showed comminuted depressed right orbital floor fracture, right-sided exophthalmos.  Already seen by ophthalmologist, plastic surgery as well as neurosurgery.  Hospital service were then consulted to admit the patient overnight for observation and pain control  Review of Systems: As per HPI otherwise negative.    Past Medical History:  Diagnosis Date  . GERD (gastroesophageal reflux disease)   . Hx of lumbosacral spine surgery t  . Hyperlipidemia   . Hypertension   . Iliac artery aneurysm ([place])   . Lumbar spinal stenosis   . Polymyalgia rheumatica ([place]) 06/2016    Past Surgical History:  Procedure Laterality Date   . JOINT REPLACEMENT  1992   right hip replacement  . JOINT REPLACEMENT  2002   left hip replacement  . ROTATOR CUFF REPAIR  2002   Left shoulder  . SPINE SURGERY  2006 & 2010   posterior spinal fusion L4-5 L5S1     reports that she has never smoked. She has never used smokeless tobacco. She reports that she does not drink alcohol and does not use drugs.  Allergies  Allergen Reactions  . Lipitor [Atorvastatin]     Dizzy ( fuzziness)  . Latex Rash    Family History  Problem Relation Age of Onset  . Heart disease Mother   . Heart disease Father        Heart Disease before age 52  . Heart attack Father   . Hyperlipidemia Father   . Heart disease Sister   . Diabetes Sister   . Heart disease Brother   . Hyperlipidemia Brother   . Heart disease Brother   . Cancer Brother     Prior to Admission medications   Medication Sig Start Date End Date Taking? Authorizing Provider  acetaminophen (TYLENOL) 500 MG tablet Take 500 mg by mouth as needed for mild pain.     [provider]  aspirin EC 81 MG tablet Take 81 mg by mouth daily. Swallow whole.    [provider]  calcium carbonate (OS-CAL - DOSED IN MG OF ELEMENTAL CALCIUM) 1250 (500 Ca) MG tablet Take 1 tablet (500 mg of elemental calcium total) by mouth 2 (two) times daily with a  meal. 09/06/19 10/06/19  Donne Hazel, MD  furosemide (LASIX) 20 MG tablet Take 2 tablets (40 mg total) by mouth daily as needed. For morning leg swelling 03/30/20   Viviana Simpler I, MD  nadolol (CORGARD) 40 MG tablet TAKE 1 TABLET BY MOUTH EVERY DAY 12/20/19   Viviana Simpler I, MD  pantoprazole (PROTONIX) 40 MG tablet Take 1 tablet (40 mg total) by mouth daily. 03/30/20   Venia Carbon, MD  risedronate (ACTONEL) 150 MG tablet Take 1 tablet (150 mg total) by mouth every 30 (thirty) days. with water on empty stomach, nothing by mouth or lie down for next 30 minutes. 09/28/19   Venia Carbon, MD    Physical Exam: Vitals:    08/18/20 1245 08/18/20 1300 08/18/20 1330 08/18/20 1400  BP: 125/60 126/60 120/61 116/60  Pulse: (!) 55 (!) 54 (!) 51 (!) 53  Resp: 17 17 12 14   Temp:      TempSrc:      SpO2: 99% 98% 99% 100%    Constitutional: NAD, calm, comfortable Vitals:   08/18/20 1245 08/18/20 1300 08/18/20 1330 08/18/20 1400  BP: 125/60 126/60 120/61 116/60  Pulse: (!) 55 (!) 54 (!) 51 (!) 53  Resp: 17 17 12 14   Temp:      TempSrc:      SpO2: 99% 98% 99% 100%   Eyes: Lids and conjunctive are normal on the left side, she has mild exophthalmos with significant bruise and hemorrhage in right orbit.  Pictures as below.  Restricted opening of the right eye. ENMT: Mucous membranes are moist. Posterior pharynx clear of any exudate or lesions.Normal dentition.  Neck: normal, supple, no masses, no thyromegaly Respiratory: clear to auscultation bilaterally, no wheezing, no crackles. Normal respiratory effort. No accessory muscle use.  Cardiovascular: Regular rate and rhythm, no murmurs / rubs / gallops. No extremity edema. 2+ pedal pulses. No carotid bruits.  Abdomen: no tenderness, no masses palpated. No hepatosplenomegaly. Bowel sounds positive.  Musculoskeletal: no clubbing / cyanosis. No joint deformity upper and lower extremities. Good ROM, no contractures. Normal muscle tone.  Skin: no rashes, lesions, ulcers. No induration Neurologic: CN 2-12 grossly intact. Sensation intact, DTR normal. Strength 5/5 in all 4.  Psychiatric: Normal judgment and insight. Alert and oriented x 3. Normal mood.      Labs on Admission: I have personally reviewed following labs and imaging studies  CBC: Recent Labs  Lab 08/18/20 0837 08/18/20 0852  WBC 9.2  --   NEUTROABS 7.3  --   HGB 15.2* 15.0  HCT 46.4* 44.0  MCV 93.5  --   PLT 198  --    Basic Metabolic Panel: Recent Labs  Lab 08/18/20 0837 08/18/20 0852  NA 140 141  K 4.3 4.2  CL 108 110  CO2 21*  --   GLUCOSE 135* 135*  BUN 13 14  CREATININE 0.89 0.80   CALCIUM 9.6  --    GFR: Estimated Creatinine Clearance: 50.9 mL/min (by C-G formula based on SCr of 0.8 mg/dL). Liver Function Tests: Recent Labs  Lab 08/18/20 0837  AST 23  ALT 13  ALKPHOS 41  BILITOT 1.2  PROT 6.5  ALBUMIN 3.8   No results for input(s): LIPASE, AMYLASE in the last 168 hours. No results for input(s): AMMONIA in the last 168 hours. Coagulation Profile: Recent Labs  Lab 08/18/20 0837  INR 1.1   Cardiac Enzymes: No results for input(s): CKTOTAL, CKMB, CKMBINDEX, TROPONINI in the last 168 hours. BNP (last  3 results) No results for input(s): PROBNP in the last 8760 hours. HbA1C: No results for input(s): HGBA1C in the last 72 hours. CBG: No results for input(s): GLUCAP in the last 168 hours. Lipid Profile: No results for input(s): CHOL, HDL, LDLCALC, TRIG, CHOLHDL, LDLDIRECT in the last 72 hours. Thyroid Function Tests: No results for input(s): TSH, T4TOTAL, FREET4, T3FREE, THYROIDAB in the last 72 hours. Anemia Panel: No results for input(s): VITAMINB12, FOLATE, FERRITIN, TIBC, IRON, RETICCTPCT in the last 72 hours. Urine analysis:    Component Value Date/Time   BILIRUBINUR negative 02/26/2016 0937   PROTEINUR negative 02/26/2016 0937   UROBILINOGEN negative 02/26/2016 0937   NITRITE negative 02/26/2016 0937   LEUKOCYTESUR Trace (A) 02/26/2016 0937    Radiological Exams on Admission: DG Wrist Complete Right  Result Date: 08/18/2020 CLINICAL DATA:  [place] at home. Patient fell over her walker. Right wrist pain. EXAM: RIGHT WRIST - COMPLETE 3+ VIEW COMPARISON:  None. FINDINGS: There is a subtle nondisplaced fracture of the distal radius, extending transversely across the metaphysis with evidence of a fracture line also intersecting the articular surface of the lunate facet. No other fractures. Widened scapholunate interval consistent with a disrupted scapholunate ligament, most likely chronic. Osteoarthritic changes at the scaphoid trapezium trapezoid  articulation and the trapezium first metacarpal articulation with joint space narrowing, subchondral sclerosis and marginal osteophytes. Chondrocalcinosis noted along the triangular fibrocartilage complex. Mild dorsal soft tissue swelling. IMPRESSION: 1. Subtle nondisplaced fracture of the distal radial metaphysis with an intra-articular component. 2. No dislocation. 3. Disrupted scapholunate ligament, likely chronic. Electronically Signed   By: Lajean Manes M.D.   On: 08/18/2020 08:10   CT Head Wo Contrast  Result Date: 08/18/2020 CLINICAL DATA:  Patient fell striking the side of her face on a dresser. EXAM: CT HEAD WITHOUT CONTRAST CT ORBITS WITHOUT CONTRAST CT CERVICAL SPINE WITHOUT CONTRAST TECHNIQUE: Multidetector CT imaging of the head, cervical spine, and maxillofacial structures were performed using the standard protocol without intravenous contrast. Multiplanar CT image reconstructions of the cervical spine and maxillofacial structures were also generated. COMPARISON:  None. FINDINGS: CT HEAD FINDINGS Brain: Small amount of subarachnoid hemorrhage lies along the anterior left frontal lobe. No other intracranial hemorrhage. Ventricles are normal in size, for the patient's age, and normal in configuration. There are no parenchymal masses or mass effect. There is no evidence of ischemic infarction. Mild hypoattenuation lines the periventricular white matter consistent with chronic microvascular ischemic change. No extra-axial masses. Vascular: No hyperdense vessel or unexpected calcification. Skull: No skull fracture. Other: None. CT ORBIT FINDINGS Osseous: Comminuted, displaced and inferiorly depressed fractures of the right orbital floor, depressed into the maxillary sinus by 1.1 cm. Fractures extend from just posterior to the inferior orbital rim posteriorly to near the orbital apex. There is a nondisplaced fracture of the anterior, inferior medial orbital wall on the right. No other fractures.  No bone  lesions. Orbits: Right-sided exophthalmos. There is hemorrhage in the right postseptal orbit, intraconal as well as extraconal spaces. The right globe is intact. The extraocular muscles and optic nerve appear intact. Normal left globe and orbit. Sinuses: Right maxillary sinus is opacified with hemorrhage. Mild mucosal thickening lines multiple right ethmoid air cells. Remaining sinuses and the mastoid air cells are clear. Soft tissues: Right periorbital soft tissue swelling/hemorrhage. CT CERVICAL SPINE FINDINGS Alignment: Normal. Skull base and vertebrae: No acute fracture. No primary bone lesion or focal pathologic process. Soft tissues and spinal canal: No prevertebral fluid or swelling. No visible canal  hematoma. Disc levels: Moderate to marked loss of disc space noted from C3-C4 through the upper thoracic spine associated with endplate spurring and varying degrees of disc bulging. There are facet degenerative changes, less pronounced, most evident on the right at C2-C3. No convincing disc herniation. Upper chest: Enlarged heterogeneous thyroid gland. No acute findings. Other: None. IMPRESSION: HEAD CT 1. Small amount of subarachnoid hemorrhage over the anterior left frontal lobe. 2. No other acute intracranial abnormality. 3. No skull fracture. ORBITAL CT 1. Comminuted depressed right orbital floor fractures, with a fracture also involving the adjacent anterior inferior right medial orbital wall. 2. Right-sided exophthalmos due to postseptal intra and extraconal orbital hemorrhage. Right globe appears intact. CERVICAL CT 1. No fracture or acute finding. 2. Enlarged heterogeneous thyroid gland. Recommend nonemergent/urgent thyroid ultrasound (ref: J Am Coll Radiol. 2015 Feb;12(2): 143-50). Critical Value/emergent results were called by telephone at the time of interpretation on 08/18/2020 at 8:54 am to provider [facility] , who verbally acknowledged these results. Electronically Signed   By: Lajean Manes M.D.    On: 08/18/2020 08:54   CT Cervical Spine Wo Contrast  Result Date: 08/18/2020 CLINICAL DATA:  Patient fell striking the side of her face on a dresser. EXAM: CT HEAD WITHOUT CONTRAST CT ORBITS WITHOUT CONTRAST CT CERVICAL SPINE WITHOUT CONTRAST TECHNIQUE: Multidetector CT imaging of the head, cervical spine, and maxillofacial structures were performed using the standard protocol without intravenous contrast. Multiplanar CT image reconstructions of the cervical spine and maxillofacial structures were also generated. COMPARISON:  None. FINDINGS: CT HEAD FINDINGS Brain: Small amount of subarachnoid hemorrhage lies along the anterior left frontal lobe. No other intracranial hemorrhage. Ventricles are normal in size, for the patient's age, and normal in configuration. There are no parenchymal masses or mass effect. There is no evidence of ischemic infarction. Mild hypoattenuation lines the periventricular white matter consistent with chronic microvascular ischemic change. No extra-axial masses. Vascular: No hyperdense vessel or unexpected calcification. Skull: No skull fracture. Other: None. CT ORBIT FINDINGS Osseous: Comminuted, displaced and inferiorly depressed fractures of the right orbital floor, depressed into the maxillary sinus by 1.1 cm. Fractures extend from just posterior to the inferior orbital rim posteriorly to near the orbital apex. There is a nondisplaced fracture of the anterior, inferior medial orbital wall on the right. No other fractures.  No bone lesions. Orbits: Right-sided exophthalmos. There is hemorrhage in the right postseptal orbit, intraconal as well as extraconal spaces. The right globe is intact. The extraocular muscles and optic nerve appear intact. Normal left globe and orbit. Sinuses: Right maxillary sinus is opacified with hemorrhage. Mild mucosal thickening lines multiple right ethmoid air cells. Remaining sinuses and the mastoid air cells are clear. Soft tissues: Right periorbital  soft tissue swelling/hemorrhage. CT CERVICAL SPINE FINDINGS Alignment: Normal. Skull base and vertebrae: No acute fracture. No primary bone lesion or focal pathologic process. Soft tissues and spinal canal: No prevertebral fluid or swelling. No visible canal hematoma. Disc levels: Moderate to marked loss of disc space noted from C3-C4 through the upper thoracic spine associated with endplate spurring and varying degrees of disc bulging. There are facet degenerative changes, less pronounced, most evident on the right at C2-C3. No convincing disc herniation. Upper chest: Enlarged heterogeneous thyroid gland. No acute findings. Other: None. IMPRESSION: HEAD CT 1. Small amount of subarachnoid hemorrhage over the anterior left frontal lobe. 2. No other acute intracranial abnormality. 3. No skull fracture. ORBITAL CT 1. Comminuted depressed right orbital floor fractures, with a fracture also  involving the adjacent anterior inferior right medial orbital wall. 2. Right-sided exophthalmos due to postseptal intra and extraconal orbital hemorrhage. Right globe appears intact. CERVICAL CT 1. No fracture or acute finding. 2. Enlarged heterogeneous thyroid gland. Recommend nonemergent/urgent thyroid ultrasound (ref: J Am Coll Radiol. 2015 Feb;12(2): 143-50). Critical Value/emergent results were called by telephone at the time of interpretation on 08/18/2020 at 8:54 am to provider [facility] , who verbally acknowledged these results. Electronically Signed   By: Lajean Manes M.D.   On: 08/18/2020 08:54   DG HIP UNILAT WITH PELVIS 2-3 VIEWS RIGHT  Result Date: 08/18/2020 CLINICAL DATA:  [place] [address] at home. Patient fell over her walker. Complaining of right-sided pain. EXAM: DG HIP (WITH OR WITHOUT PELVIS) 2-3V RIGHT COMPARISON:  12/27/2015. FINDINGS: No acute fracture.  Old fracture of the left inferior pubic ramus. Bilateral total hip arthroplasties appear well seated and aligned. SI joints and symphysis pubis are normally spaced  and aligned. Skeletal structures are diffusely demineralized. Soft tissues are unremarkable. IMPRESSION: 1. No acute fracture or dislocation. No evidence of loosening of either hip arthroplasty. Electronically Signed   By: Lajean Manes M.D.   On: 08/18/2020 08:08   CT OrbitsS W/O CM  Result Date: 08/18/2020 CLINICAL DATA:  Patient fell striking the side of her face on a dresser. EXAM: CT HEAD WITHOUT CONTRAST CT ORBITS WITHOUT CONTRAST CT CERVICAL SPINE WITHOUT CONTRAST TECHNIQUE: Multidetector CT imaging of the head, cervical spine, and maxillofacial structures were performed using the standard protocol without intravenous contrast. Multiplanar CT image reconstructions of the cervical spine and maxillofacial structures were also generated. COMPARISON:  None. FINDINGS: CT HEAD FINDINGS Brain: Small amount of subarachnoid hemorrhage lies along the anterior left frontal lobe. No other intracranial hemorrhage. Ventricles are normal in size, for the patient's age, and normal in configuration. There are no parenchymal masses or mass effect. There is no evidence of ischemic infarction. Mild hypoattenuation lines the periventricular white matter consistent with chronic microvascular ischemic change. No extra-axial masses. Vascular: No hyperdense vessel or unexpected calcification. Skull: No skull fracture. Other: None. CT ORBIT FINDINGS Osseous: Comminuted, displaced and inferiorly depressed fractures of the right orbital floor, depressed into the maxillary sinus by 1.1 cm. Fractures extend from just posterior to the inferior orbital rim posteriorly to near the orbital apex. There is a nondisplaced fracture of the anterior, inferior medial orbital wall on the right. No other fractures.  No bone lesions. Orbits: Right-sided exophthalmos. There is hemorrhage in the right postseptal orbit, intraconal as well as extraconal spaces. The right globe is intact. The extraocular muscles and optic nerve appear intact. Normal left  globe and orbit. Sinuses: Right maxillary sinus is opacified with hemorrhage. Mild mucosal thickening lines multiple right ethmoid air cells. Remaining sinuses and the mastoid air cells are clear. Soft tissues: Right periorbital soft tissue swelling/hemorrhage. CT CERVICAL SPINE FINDINGS Alignment: Normal. Skull base and vertebrae: No acute fracture. No primary bone lesion or focal pathologic process. Soft tissues and spinal canal: No prevertebral fluid or swelling. No visible canal hematoma. Disc levels: Moderate to marked loss of disc space noted from C3-C4 through the upper thoracic spine associated with endplate spurring and varying degrees of disc bulging. There are facet degenerative changes, less pronounced, most evident on the right at C2-C3. No convincing disc herniation. Upper chest: Enlarged heterogeneous thyroid gland. No acute findings. Other: None. IMPRESSION: HEAD CT 1. Small amount of subarachnoid hemorrhage over the anterior left frontal lobe. 2. No other acute intracranial abnormality. 3. No  skull fracture. ORBITAL CT 1. Comminuted depressed right orbital floor fractures, with a fracture also involving the adjacent anterior inferior right medial orbital wall. 2. Right-sided exophthalmos due to postseptal intra and extraconal orbital hemorrhage. Right globe appears intact. CERVICAL CT 1. No fracture or acute finding. 2. Enlarged heterogeneous thyroid gland. Recommend nonemergent/urgent thyroid ultrasound (ref: J Am Coll Radiol. 2015 Feb;12(2): 143-50). Critical Value/emergent results were called by telephone at the time of interpretation on 08/18/2020 at 8:54 am to provider [facility] , who verbally acknowledged these results. Electronically Signed   By: Lajean Manes M.D.   On: 08/18/2020 08:54    Assessment/Plan Active Problems:   Fall   Right orbital fracture, closed, initial encounter ([place])   Mechanical fall: We will consult PT OT to assess her tomorrow morning.  Minimal punctate  traumatic intracranial hemorrhage and subarachnoid hemorrhage with no mass-effect: Seen by neurosurgery, no intervention or any repeat CT scan recommended.  Right orbital fracture/retrobulbar hemorrhage right: Seen by ophthalmology/Dr. Prudencio Burly in the ED and according to his note, no need for acute canthotomy, no intervention recommended.  Keep head of bed elevated, cold compresses already.  Follow-up with [facility] on Monday.  She was also seen by plastic surgery and according to their note, they will follow with patient while inpatient.  I will hold her aspirin.  Sinus bradycardia: Unknown whether she has history of bradycardia in the past.  We will hold beta-blocker.  GERD: Resume PPI.  Essential hypertension: Initial blood pressure elevated which improved after 1 dose of IV hydralazine.  She is only on nadolol, at home which we are holding.  We will place her on as needed hydralazine.  DVT prophylaxis: scd Code Status: DNR Family Communication: Daughter present at bedside.  Plan of care discussed with patient and daughter in length and they verbalized understanding and agreed with it. Disposition Plan: Likely home in next 1 to 2 days Consults called: Neurosurgery, plastic surgery and ophthalmology Admission status: Observation   Status is: Observation  The patient remains OBS appropriate and will d/c before 2 midnights.  Dispo: The patient is from: Home              Anticipated d/c is to: Home              Patient currently is not medically stable to d/c.   Difficult to place patient No       Darliss Cheney MD [facility]  08/18/2020, 2:17 PM  To contact the attending provider between 7A-7P or the covering provider during after hours 7P-7A, please log into the web site [email or web address]

## 2020-08-18 NOTE — ED Notes (Signed)
Attempted to call nursing report to [phone] 

## 2020-08-18 NOTE — ED Notes (Signed)
opthalmology at bedside.

## 2020-08-18 NOTE — ED Notes (Signed)
Pt transported to CT ?

## 2020-08-19 DIAGNOSIS — S0230XA Fracture of orbital floor, unspecified side, initial encounter for closed fracture: Secondary | ICD-10-CM | POA: Diagnosis not present

## 2020-08-19 LAB — BASIC METABOLIC PANEL
Anion gap: 6 (ref 5–15)
BUN: 10 mg/dL (ref 8–23)
CO2: 27 mmol/L (ref 22–32)
Calcium: 9.1 mg/dL (ref 8.9–10.3)
Chloride: 106 mmol/L (ref 98–111)
Creatinine, Ser: 0.85 mg/dL (ref 0.44–1.00)
GFR, Estimated: >60 mL/min (ref >60)
Glucose, Bld: 110 mg/dL — ABNORMAL HIGH (ref 70–99)
Potassium: 3.8 mmol/L (ref 3.5–5.1)
Sodium: 139 mmol/L (ref 135–145)

## 2020-08-19 LAB — CBC
HCT: 38 % (ref 36.0–46.0)
Hemoglobin: 12.3 g/dL (ref 12.0–15.0)
MCH: 30.8 pg (ref 26.0–34.0)
MCHC: 32.4 g/dL (ref 30.0–36.0)
MCV: 95 fL (ref 80.0–100.0)
Platelets: 174 10*3/uL (ref 150–400)
RBC: 4 MIL/uL (ref 3.87–5.11)
RDW: 13.5 % (ref 11.5–15.5)
WBC: 6.8 10*3/uL (ref 4.0–10.5)
nRBC: 0 % (ref 0.0–0.2)

## 2020-08-19 MED ORDER — CYCLOBENZAPRINE HCL 5 MG PO TABS
5.0000 mg | ORAL_TABLET | Freq: Three times a day (TID) | ORAL | Status: DC | PRN
  Administered 2020-08-19 – 2020-08-24 (×6): 5 mg via ORAL
  Filled 2020-08-19 (×6): qty 1

## 2020-08-19 MED ORDER — BISACODYL 5 MG PO TBEC
5.0000 mg | DELAYED_RELEASE_TABLET | Freq: Every day | ORAL | Status: DC | PRN
  Administered 2020-08-20: 5 mg via ORAL
  Filled 2020-08-19: qty 1

## 2020-08-19 NOTE — Care Management Obs Status (Signed)
[place] NOTIFICATION   Patient Details  Name: Sydney [name] MRN: [id number] Date of Birth: 1932/04/24   Medicare Observation Status Notification Given:  Yes    Bartholomew Crews, RN 08/19/2020, 1:10 PM

## 2020-08-19 NOTE — Evaluation (Signed)
Physical Therapy Evaluation Patient Details Name: Sydney [name] MRN: [id number] DOB: [birth year] Today's Date: 08/19/2020   History of Present Illness  Admitted after mechanical fall resulting in right orbital floor fracture, right-sided exophthalmos, Opthalmology on board; Minimal punctate traumatic intracranial hemorrhage and subarachnoid hemorrhage with no mass-effect: Seen by neurosurgery, no intervention or any repeat CT scan recommended.  Clinical Impression   Pt admitted with above diagnosis. Comes from home where she lives alone typically, but her daughter is visiting from [place], [place] (with unrushed plans to get back to [facility]); 4 steps to enter her home, Rail L; typically independent, was planning a trip to [place]; Presents to PT with pain in R wrist and groin limiting activity tolerance, difficulty walking due to pain, and needing up to mod assist for transfers, mobility, and stairs;  Pt currently with functional limitations due to the deficits listed below (see PT Problem List). Pt will benefit from skilled PT to increase their independence and safety with mobility to allow discharge to the venue listed below.       Follow Up Recommendations Home health PT;Supervision/Assistance - 24 hour;Other (comment) (Consider HHOT/Aide as well)    Equipment Recommendations  None recommended by PT    Recommendations for Other Services OT consult (ordered per protocol)     Precautions / Restrictions Precautions Precautions: Fall      Mobility  Bed Mobility Overal bed mobility: Needs Assistance Bed Mobility: Supine to Sit     Supine to sit: Mod assist Sit to supine: Mod assist   General bed mobility comments: started getting up via roll to side and going sidelying to sit, however R hip pain made than untenable, so eased LEs off of teh bed helicopter-style, and mod assist to elevate trunk to sit    Transfers Overall transfer level: Needs assistance Equipment used: Rolling walker  (2 wheeled) Transfers: Sit to/from Stand Sit to Stand: Min assist Stand pivot transfers: Min assist       General transfer comment: Verbal cues for hand placement and to avoid pushing with Rt UE as well as assist to move sit to stand  Ambulation/Gait Ambulation/Gait assistance: Min assist Gait Distance (Feet): 40 Feet (20+20) Assistive device: Rolling walker (2 wheeled) Gait Pattern/deviations: Step-to pattern Gait velocity: quite slow   General Gait Details: very painful R hip and groin in stance; used RW to help unweigh painful RLE in stance; steps lenght becomes significantly shorter with incr pain and fatigue  Stairs Stairs: Yes Stairs assistance: Mod assist Stair Management: One rail Left;Step to pattern;Forwards Number of Stairs: 2 General stair comments: Attempted to go up with one or both hands on L rail, however, pt with too much pain in R groin while stepping LLE up; tehn used L hand on rail and RUE over the shoulder assist from PT; able to go up 2 steps in this fashion; pt exhausted and requested to stop at 2 steps  Wheelchair Mobility    Modified Rankin (Stroke Patients Only)       Balance Overall balance assessment: Needs assistance Sitting-balance support: Feet supported Sitting balance-Leahy Scale: Good     Standing balance support: Single extremity supported Standing balance-Leahy Scale: Poor Standing balance comment: requires UE support                             Pertinent Vitals/Pain Pain Assessment: 0-10 Pain Score: 5  Pain Location: Rt groin and Rt wrist Pain Descriptors / Indicators:  Grimacing;Guarding;Restless;Throbbing Pain Intervention(s): Monitored during session    Home Living Family/patient expects to be discharged to:: Private residence Living Arrangements: Alone;Other (Comment) (daughter will be staying with her at discharge (daughter lives in [place]))   Type of Home: House Home Access: Stairs to enter Entrance  Stairs-Rails: Left Entrance Stairs-Number of Steps: 3 Home Layout: Able to live on main level with bedroom/bathroom Home Equipment: Cane - single point;Walker - 4 wheels;Shower seat Additional Comments: Pt's spouse died in Apr 27, 2022.  She has a son and grand daughter locally who assist as needed.    Prior Function Level of Independence: Independent with assistive device(s)         Comments: uses a rollator in the house and a cane in the community.  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  Since the onset of the pandemic, she stopped driving and going out in the community.  She has a local son and grand daughter who provide transportation and get her groceries.     Hand Dominance   Dominant Hand: Right    Extremity/Trunk Assessment   Upper Extremity Assessment Upper Extremity Assessment: Defer to OT evaluation RUE Deficits / Details: Rt wrist splint in place.  Wrist not assessed.  Fingers and shoulder grossly WFL    Lower Extremity Assessment Lower Extremity Assessment: Generalized weakness;RLE deficits/detail RLE Deficits / Details: R anterior hip and groin pain limiting hip AROM and activity tolerance RLE: Unable to fully assess due to pain    Cervical / Trunk Assessment Cervical / Trunk Assessment: Normal  Communication   Communication: No difficulties  Cognition Arousal/Alertness: Awake/alert Behavior During Therapy: Anxious;Impulsive Overall Cognitive Status: Difficult to assess                                 General Comments: cogntiion difficult to accurately assess due to pt with increased anxiety due to increased pain.  She was very distracted by this and demonstrated difficulty maitaining her attention to task      General Comments General comments (skin integrity, edema, etc.): Lengthy conversations re: maanging at home with assist of daughter for now; We also considered other options for stairs, given her difficulty today;  Communicated with Dr. Cruzita Lederer that another day inpatient will likely benefit Sydney [name], but we arent' sure how that effects her Ophthalmology folow up appointments    Exercises     Assessment/Plan    PT Assessment Patient needs continued PT services  PT Problem List Decreased strength;Decreased range of motion;Decreased activity tolerance;Decreased balance;Decreased mobility;Decreased coordination;Decreased cognition;Decreased knowledge of use of DME;Decreased safety awareness;Decreased knowledge of precautions;Pain       PT Treatment Interventions DME instruction;Gait training;Stair training;Functional mobility training;Therapeutic activities;Therapeutic exercise;Cognitive remediation;Neuromuscular re-education;Patient/family education    PT Goals (Current goals can be found in the Care Plan section)  Acute Rehab PT Goals Patient Stated Goal: to have less pain PT Goal Formulation: With patient Time For Goal Achievement: 09/02/20 Potential to Achieve Goals: Good    Frequency Min 5X/week   Barriers to discharge   Difficulty negotiating stairs today    Co-evaluation               AM-PAC PT "6 Clicks" Mobility  Outcome Measure Help needed turning from your back to your side while in a flat bed without using bedrails?: A Little Help needed moving from lying on your back to sitting on the side of a flat bed without  using bedrails?: A Lot Help needed moving to and from a bed to a chair (including a wheelchair)?: A Lot Help needed standing up from a chair using your arms (e.g., wheelchair or bedside chair)?: A Little Help needed to walk in hospital room?: A Little Help needed climbing 3-5 steps with a railing? : A Lot 6 Click Score: 15    End of Session Equipment Utilized During Treatment: Gait belt (L wrist splint) Activity Tolerance: Patient tolerated treatment well (even though quite painful) Patient left: in chair;with call bell/phone within reach;with family/visitor  present Nurse Communication: Mobility status PT Visit Diagnosis: Unsteadiness on feet (R26.81);Other abnormalities of gait and mobility (R26.89);Pain Pain - Right/Left: Right Pain - part of body:  (wrist and fgroin)    Time: 1101-1145 PT Time Calculation (min) (ACUTE ONLY): 44 min   Charges:   PT Evaluation $PT Eval Moderate Complexity: 1 Mod PT Treatments $Gait Training: 8-22 mins $Therapeutic Activity: 8-22 mins        Roney Marion, PT  Acute Rehabilitation Services Pager [phone] Office [place] 08/19/2020, 3:36 PM

## 2020-08-19 NOTE — Progress Notes (Signed)
PROGRESS NOTE  Sydney [name] NIO:[id number] DOB: Dec 09, 1932 DOA: 08/18/2020 PCP: Venia Carbon, MD   LOS: 0 days   Brief Narrative / Interim history: 87 y.o.femalewith medical history significant ofGERD, hypertension, hyperlipidemia, PMR was brought into the emergency department after a fall. In the morning ~ 6 m, patient was walking around in her bathroom when suddenly her walker slipped away and she fell and hit her head to the dresser very hard. No syncope / LOC, it was apparently a mechanical fall  Subjective / 24h Interval events: Feeling better than yesterday, however her right wrist is hurting a lot and she still feeling weak  Assessment & Plan: Principal Problem Right orbital fracture/retrobulbar hemorrhage right - Seen by ophthalmology/Dr. Prudencio Burly in the ED and according to his note, no need for acute interventions. Keep head of bed elevated, cold compresses. Follow-up with [facility] on Monday, clinic will call for an appointment, could potentially be later this week. She was also seen by plastic surgery and according to their note, they will follow in office. Will hold her aspirin.  -Patient quite deconditioned when working with physical therapy today, PT recommends 1 additional day in the hospital for repeat session  Active Problems Minimal punctate traumatic intracranial hemorrhage and subarachnoid hemorrhage with no mass-effect: Seen by neurosurgery, no intervention or any repeat CT scan recommended.  Sinus bradycardia-Unknown whether she has history of bradycardia in the past. We will hold beta-blocker.  Essential hypertension - normotensive off nadolol. Continue to hold due to bradycardia. Continue Lasix  Scheduled Meds: . hydrALAZINE  10 mg Intravenous Once  . pantoprazole  40 mg Oral Daily   Continuous Infusions: . lactated ringers 125 mL/hr at 08/19/20 0125   PRN Meds:.acetaminophen, hydrALAZINE, morphine injection, ondansetron  (ZOFRAN) IV, ondansetron **OR** ondansetron (ZOFRAN) IV, oxyCODONE  Diet Orders (From admission, onward)    Start     Ordered   08/18/20 1429  Diet Heart Room service appropriate? Yes; Fluid consistency: Thin  Diet effective now       Question Answer Comment  Room service appropriate? Yes   Fluid consistency: Thin      08/18/20 1430          DVT prophylaxis: SCDs Start: 08/18/20 1429     Code Status: DNR  Family Communication: Daughter at bedside  Status is: Observation  The patient will require care spanning > 2 midnights and should be moved to inpatient because: Unsafe d/c plan  Dispo: The patient is from: Home              Anticipated d/c is to: Home              Patient currently is not medically stable to d/c.   Difficult to place patient No    Level of care: Med-Surg  Consultants:  Neurosurgery Ophthalmology Trauma surgery  Procedures:  none  Microbiology  none  Antimicrobials: none    Objective: Vitals:   08/18/20 1623 08/18/20 2000 08/19/20 0226 08/19/20 0619  BP: (!) 112/51 (!) 122/58 (!) 125/56 123/61  Pulse: (!) 56 (!) 57 (!) 48 (!) 52  Resp: 16 18 16 16   Temp: 97.6 F (36.4 C) 98.1 F (36.7 C) (!) 97.5 F (36.4 C) 98.4 F (36.9 C)  TempSrc: Oral Oral Oral Oral  SpO2: 96% 99% 97% 94%    Intake/Output Summary (Last 24 hours) at 08/19/2020 1312 Last data filed at 08/19/2020 8182 Gross per 24 hour  Intake 1161.78 ml  Output 300 ml  Net 861.78 ml   There were no vitals filed for this visit.  Examination:  Constitutional: NAD Eyes: no scleral icterus ENMT: Mucous membranes are moist.  Right eye ecchymosis Neck: normal, supple Respiratory: clear to auscultation bilaterally, no wheezing, no crackles. Normal respiratory effort.  Cardiovascular: Regular rate and rhythm, no murmurs / rubs / gallops. No LE edema. Abdomen: non distended, no tenderness. Bowel sounds positive.  Musculoskeletal: no clubbing / cyanosis.  Skin: no  rashes Neurologic: No focal deficits  Data Reviewed: I have independently reviewed following labs and imaging studies   CBC: Recent Labs  Lab 08/18/20 0837 08/18/20 0852 08/19/20 0057  WBC 9.2  --  6.8  NEUTROABS 7.3  --   --   HGB 15.2* 15.0 12.3  HCT 46.4* 44.0 38.0  MCV 93.5  --  95.0  PLT 198  --  902   Basic Metabolic Panel: Recent Labs  Lab 08/18/20 0837 08/18/20 0852 08/19/20 0057  NA 140 141 139  K 4.3 4.2 3.8  CL 108 110 106  CO2 21*  --  27  GLUCOSE 135* 135* 110*  BUN 13 14 10   CREATININE 0.89 0.80 0.85  CALCIUM 9.6  --  9.1   Liver Function Tests: Recent Labs  Lab 08/18/20 0837  AST 23  ALT 13  ALKPHOS 41  BILITOT 1.2  PROT 6.5  ALBUMIN 3.8   Coagulation Profile: Recent Labs  Lab 08/18/20 0837  INR 1.1   HbA1C: No results for input(s): HGBA1C in the last 72 hours. CBG: No results for input(s): GLUCAP in the last 168 hours.  Recent Results (from the past 240 hour(s))  Resp Panel by RT-PCR (Flu A&B, Covid) Nasopharyngeal Swab     Status: None   Collection Time: 08/18/20  9:11 AM   Specimen: Nasopharyngeal Swab; Nasopharyngeal(NP) swabs in vial transport medium  Result Value Ref Range Status   SARS Coronavirus 2 by RT PCR NEGATIVE NEGATIVE Final    Comment: (NOTE) SARS-CoV-2 target nucleic acids are NOT DETECTED.  The SARS-CoV-2 RNA is generally detectable in upper respiratory specimens during the acute phase of infection. The lowest concentration of SARS-CoV-2 viral copies this assay can detect is 138 copies/mL. A negative result does not preclude SARS-Cov-2 infection and should not be used as the sole basis for treatment or other patient management decisions. A negative result may occur with  improper specimen collection/handling, submission of specimen other than nasopharyngeal swab, presence of viral mutation(s) within the areas targeted by this assay, and inadequate number of viral copies(<138 copies/mL). A negative result must be  combined with clinical observations, patient history, and epidemiological information. The expected result is Negative.  Fact Sheet for Patients:  EntrepreneurPulse.com.au  Fact Sheet for Healthcare Providers:  IncredibleEmployment.be  This test is no t yet approved or cleared by the [place] FDA and  has been authorized for detection and/or diagnosis of SARS-CoV-2 by FDA under an Emergency Use Authorization (EUA). This EUA will remain  in effect (meaning this test can be used) for the duration of the COVID-19 declaration under Section 564(b)(1) of the Act, 21 U.S.C.section 360bbb-3(b)(1), unless the authorization is terminated  or revoked sooner.       Influenza A by PCR NEGATIVE NEGATIVE Final   Influenza B by PCR NEGATIVE NEGATIVE Final    Comment: (NOTE) The Xpert Xpress SARS-CoV-2/FLU/RSV plus assay is intended as an aid in the diagnosis of influenza from Nasopharyngeal swab specimens and should not be used as a sole basis for  treatment. Nasal washings and aspirates are unacceptable for Xpert Xpress SARS-CoV-2/FLU/RSV testing.  Fact Sheet for Patients: EntrepreneurPulse.com.au  Fact Sheet for Healthcare Providers: IncredibleEmployment.be  This test is not yet approved or cleared by the [place] FDA and has been authorized for detection and/or diagnosis of SARS-CoV-2 by FDA under an Emergency Use Authorization (EUA). This EUA will remain in effect (meaning this test can be used) for the duration of the COVID-19 declaration under Section 564(b)(1) of the Act, 21 U.S.C. section 360bbb-3(b)(1), unless the authorization is terminated or revoked.  Performed at [facility], [place] [address] [place], [place] [address]      Radiology Studies: No results found.  Marzetta Board, MD, PhD [facility]  Between 7 am - 7 pm I am available, please contact me via Amion (for  emergencies) or Securechat (non urgent messages)  Between 7 pm - 7 am I am not available, please contact night coverage MD/APP via Amion

## 2020-08-19 NOTE — Progress Notes (Signed)
Orthopedic Tech Progress Note Patient Details:  [name] 21-Feb-1933 [id number]  Ortho Devices Type of Ortho Device: Velcro wrist splint Ortho Device/Splint Location: Right Upper Extremity Ortho Device/Splint Interventions: Ordered,Application,Adjustment   Post Interventions Patient Tolerated: Well Instructions Provided: Adjustment of device,Care of device,Poper ambulation with device   Amdrew Oboyle P Lorel [place] 08/19/2020, 11:29 AM

## 2020-08-19 NOTE — TOC Initial Note (Signed)
Transition of Care [facility]) - Initial/Assessment Note    Patient Details  Name: Sydney [name] MRN: [id number] Date of Birth: [birth year]  [facility] CM/SW Contact:    Bartholomew Crews, RN Phone Number: [phone] 08/19/2020, 4:20 PM  Clinical Narrative:                  Spoke with patient and daughter at the bedside to discuss transition planning. Patient stated that her spouse passed away. Daughter is available and able to assist with her care. Agreeable to home health PT/OT recommendations. HH orders placed by MD. Referral accepted by [facility]. Patient may need ambulance transport at time of discharge - will follow.   Expected Discharge Plan: [place] Barriers to Discharge: Continued Medical Work up   Patient Goals and CMS Choice Patient states their goals for this hospitalization and ongoing recovery are:: return home CMS Medicare.gov Compare Post Acute Care list provided to:: Patient Choice offered to / list presented to : Patient  Expected Discharge Plan and Services Expected Discharge Plan: [place] In-house Referral: Clinical Social Work Discharge Planning Services: CM Consult Post Acute Care Choice: [place] arrangements for the past 2 months: Single Family Home                 DME Arranged: N/A DME Agency: NA       HH Arranged: PT,OT [place] Agency: [facility] [employer] [facility] [employer] (now known as [employer]) Date HH Agency Contacted: 08/19/20 Time [place]: 1620 Representative spoke with at [place]: [place] Arrangements/Services Living arrangements for the past 2 months: [place] with:: [place] Patient language and need for interpreter reviewed:: Yes Do you feel safe going back to the place where you live?: Yes      Need for Family Participation in Patient Care: Yes (Comment) Care giver support system in place?: Yes (comment) Current home  services: DME Criminal Activity/Legal Involvement Pertinent to Current Situation/Hospitalization: No - Comment as needed  Activities of Daily Living      Permission Sought/Granted Permission sought to share information with : Family Supports Permission granted to share information with : Yes, Verbal Permission Granted        Permission granted to share info w Relationship: daughter     Emotional Assessment Appearance:: Appears stated age Attitude/Demeanor/Rapport: Engaged Affect (typically observed): Accepting Orientation: : Oriented to Self,Oriented to  Time,Oriented to Place,Oriented to Situation Alcohol / Substance Use: Not Applicable Psych Involvement: No (comment)  Admission diagnosis:  SAH (subarachnoid hemorrhage) ([place]) [I60.9] Fall [W19.XXXA] Closed blow out fracture of orbit, initial encounter [facility]) [S02.30XA] Patient Active Problem List   Diagnosis Date Noted  . Fall 08/18/2020  . Right orbital fracture, closed, initial encounter ([place]) 08/18/2020  . Tinnitus 08/08/2020  . Stage 3a chronic kidney disease ([place]) 03/30/2020  . Osteoporosis 09/02/2019  . Polymyalgia rheumatica ([place]) 06/12/2016  . Hyperlipemia 07/23/2015  . Generalized osteoarthritis of multiple sites 07/14/2014  . Advanced directives, counseling/discussion 12/02/2013  . Routine general medical examination at a health care facility 11/30/2012  . GERD (gastroesophageal reflux disease)   . Iliac artery aneurysm, right ([place]) 01/18/2011  . Obesity, unspecified 01/02/2009  . DEGENERATIVE DISC DISEASE, LUMBAR SPINE 01/02/2009  . Essential hypertension, benign 01/14/2007   PCP:  Venia Carbon, MD Pharmacy:   [facility] - [place], [address] [place]  [place] [address] Phone: [phone] Fax: [phone]  [id number] - [place], [place] - [address] [place] [address] Phone:  [phone] Fax: [phone]     Social Determinants of Health (SDOH) Interventions    Readmission Risk Interventions No flowsheet data found.

## 2020-08-19 NOTE — Evaluation (Signed)
Occupational Therapy Evaluation Patient Details Name: Sydney [name] MRN: [id number] DOB: [birth year] Today's Date: 08/19/2020    History of Present Illness Admitted after mechanical fall resulting in right orbital floor fracture, right-sided exophthalmos, Opthalmology on board; Minimal punctate traumatic intracranial hemorrhage and subarachnoid hemorrhage with no mass-effect: Seen by neurosurgery, no intervention or any repeat CT scan recommended.   Clinical Impression   Pt admitted with above. She demonstrates the below listed deficits and will benefit from continued OT to maximize safety and independence with BADLs.  Pt presents to OT with generalized weakness, impaired balance, decreased activity tolerance, and increased pain.  She currently requires set up assist - mod A for ADLs and min A for functional mobility.  PTA, pt lived alone with local family assisting with transportation and acquiring groceries.  She was mod I for remainder of ADLs.  Her daughter was visiting from [facility] at the time of the fall and will be able to stay with pt at discharge.  OT will follow acutely.       Follow Up Recommendations  Home health OT;Supervision/Assistance - 24 hour    Equipment Recommendations  None recommended by OT    Recommendations for Other Services       Precautions / Restrictions Precautions Precautions: Fall      Mobility Bed Mobility Overal bed mobility: Needs Assistance Bed Mobility: Sit to Supine       Sit to supine: Mod assist   General bed mobility comments: assist for LEs    Transfers Overall transfer level: Needs assistance Equipment used: 1 person hand held assist Transfers: Sit to/from Stand;Stand Pivot Transfers Sit to Stand: Min assist Stand pivot transfers: Min assist       General transfer comment: Verbal cues for hand placement and to avoid pushing with Rt UE as well as assist to move sit to stand    Balance Overall balance assessment: Needs  assistance Sitting-balance support: Feet supported Sitting balance-Leahy Scale: Good     Standing balance support: Single extremity supported Standing balance-Leahy Scale: Poor Standing balance comment: requires UE support                           ADL either performed or assessed with clinical judgement   ADL Overall ADL's : Needs assistance/impaired Eating/Feeding: Set up;Sitting   Grooming: Wash/dry hands;Wash/dry face;Oral care;Brushing hair;Set up;Sitting   Upper Body Bathing: Minimal assistance;Sitting   Lower Body Bathing: Moderate assistance;Sit to/from stand   Upper Body Dressing : Minimal assistance;Sitting   Lower Body Dressing: Moderate assistance;Sit to/from stand Lower Body Dressing Details (indicate cue type and reason): Pt able to don/doff Lt sock, but unable to don/doff Rt sock due to Rt groin pain Toilet Transfer: Minimal assistance;Ambulation;Comfort height toilet   Toileting- Clothing Manipulation and Hygiene: Minimal assistance;Sit to/from stand       Functional mobility during ADLs: Minimal assistance       Vision   Additional Comments: Bruising and edmea Rt obit.  Pt reports her vision is fairly clear and she is now able to read the clock on the wall     Perception Perception Perception Tested?: Yes   Praxis Praxis Praxis tested?: Within functional limits    Pertinent Vitals/Pain Pain Assessment: 0-10 Pain Score: 5  Pain Location: Rt groin and Rt wrist Pain Descriptors / Indicators: Grimacing;Guarding;Restless;Throbbing Pain Intervention(s): Repositioned;Patient requesting pain meds-RN notified;RN gave pain meds during session     Hand Dominance Right   Extremity/Trunk Assessment  Upper Extremity Assessment Upper Extremity Assessment: RUE deficits/detail RUE Deficits / Details: Rt wrist splint in place.  Wrist not assessed.  Fingers and shoulder grossly [facility]   Lower Extremity Assessment Lower Extremity Assessment: Defer to  PT evaluation   Cervical / Trunk Assessment Cervical / Trunk Assessment: Normal   Communication Communication Communication: No difficulties   Cognition Arousal/Alertness: Awake/alert Behavior During Therapy: Anxious;Impulsive Overall Cognitive Status: Difficult to assess                                 General Comments: cogntiion difficult to accurately assess due to pt with increased anxiety due to increased pain.  She was very distracted by this and demonstrated difficulty maitaining her attention to task   General Comments       Exercises     Shoulder Instructions      Home Living Family/patient expects to be discharged to:: Private residence Living Arrangements: Alone;Other (Comment) (daughter will be staying with her at discharge (daughter lives in [place]))   Type of Home: House Home Access: Stairs to enter Technical brewer of Steps: 3 Entrance Stairs-Rails: Right Home Layout: Able to live on main level with bedroom/bathroom     Bathroom Shower/Tub: Walk-in shower;Other (comment) (walk in tub)   Bathroom Toilet: Handicapped height     Home Equipment: [place] - single point;Walker - 4 wheels;Shower seat   Additional Comments: Pt's spouse died in May 17, 2022.  She has a son and grand daughter locally who assist as needed.      Prior Functioning/Environment Level of Independence: Independent with assistive device(s)        Comments: uses a rollator in the house and a cane in the community.  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  Since the onset of the pandemic, she stopped driving and going out in the community.  She has a local son and grand daughter who provide transportation and get her groceries.        OT Problem List: Decreased strength;Decreased activity tolerance;Impaired balance (sitting and/or standing);Decreased coordination;Decreased cognition;Decreased safety awareness;Decreased knowledge of use of DME or AE;Decreased  knowledge of precautions;Impaired UE functional use;Pain      OT Treatment/Interventions: Self-care/ADL training;Therapeutic exercise;DME and/or AE instruction;Therapeutic activities;Cognitive remediation/compensation;Patient/family education;Balance training    OT Goals(Current goals can be found in the care plan section) Acute Rehab OT Goals Patient Stated Goal: to have less pain OT Goal Formulation: With patient Time For Goal Achievement: 09/02/20 Potential to Achieve Goals: Good ADL Goals Pt Will Perform Grooming: with min guard assist;standing Pt Will Perform Lower Body Bathing: with min guard assist;sit to/from stand Pt Will Perform Lower Body Dressing: with min guard assist;sit to/from stand Pt Will Transfer to Toilet: with min guard assist;ambulating;regular height toilet;bedside commode;grab bars Pt Will Perform Toileting - Clothing Manipulation and hygiene: with min guard assist;sit to/from stand  OT Frequency: Min 2X/week   Barriers to D/C:            Co-evaluation              AM-PAC OT "6 Clicks" Daily Activity     Outcome Measure Help from another person eating meals?: A Little Help from another person taking care of personal grooming?: A Little Help from another person toileting, which includes using toliet, bedpan, or urinal?: A Little Help from another person bathing (including washing, rinsing, drying)?: A Lot Help from another person to put on and  taking off regular upper body clothing?: A Little Help from another person to put on and taking off regular lower body clothing?: A Lot 6 Click Score: 16   End of Session Nurse Communication: Mobility status  Activity Tolerance: Patient limited by pain Patient left: in bed;with call bell/phone within reach;with bed alarm set  OT Visit Diagnosis: Unsteadiness on feet (R26.81);Pain Pain - Right/Left: Right Pain - part of body: Hip                Time: 1338-1400 OT Time Calculation (min): 22 min Charges:   OT General Charges $OT Visit: 1 Visit OT Evaluation $OT Eval Moderate Complexity: 1 Mod  Nilsa Nutting., OTR/L Acute Rehabilitation Services Pager [phone] Office [phone]   Lucille Passy M 08/19/2020, 2:40 PM

## 2020-08-20 ENCOUNTER — Telehealth: Payer: Self-pay | Admitting: Internal Medicine

## 2020-08-20 DIAGNOSIS — S0231XA Fracture of orbital floor, right side, initial encounter for closed fracture: Secondary | ICD-10-CM

## 2020-08-20 DIAGNOSIS — W19XXXA Unspecified fall, initial encounter: Secondary | ICD-10-CM | POA: Diagnosis not present

## 2020-08-20 DIAGNOSIS — R5381 Other malaise: Secondary | ICD-10-CM | POA: Diagnosis not present

## 2020-08-20 DIAGNOSIS — S62101A Fracture of unspecified carpal bone, right wrist, initial encounter for closed fracture: Secondary | ICD-10-CM | POA: Diagnosis not present

## 2020-08-20 DIAGNOSIS — S0285XA Fracture of orbit, unspecified, initial encounter for closed fracture: Secondary | ICD-10-CM | POA: Diagnosis not present

## 2020-08-20 MED ORDER — DICLOFENAC SODIUM 1 % EX GEL
2.0000 g | Freq: Four times a day (QID) | CUTANEOUS | Status: DC
  Administered 2020-08-20 – 2020-08-27 (×21): 2 g via TOPICAL
  Filled 2020-08-20: qty 100

## 2020-08-20 NOTE — H&P (View-Only) (Signed)
Reason for Consult/CC: Right orbital floor fracture  Sydney [name] is an 87 y.o. female.  HPI: Patient presented after ground-level fall this past weekend.  She sustained a right orbital floor fracture in addition to other injuries.  She did have her eye evaluated in the emergency room by ophthalmology who felt that no acute intervention was required.  She says her vision is done well since then and feels like the swelling is gone down quite a bit.  She does have some other orthopedic injuries and a wrist fracture that are causing her pain and limit her ability to get up and around.  She denies any double vision right now.  Allergies:  Allergies  Allergen Reactions  . Lipitor [Atorvastatin]     Dizzy ( fuzziness)  . Latex Rash    Medications:  Current Facility-Administered Medications:  .  acetaminophen (TYLENOL) tablet 500 mg, 500 mg, Oral, Q6H PRN, Doristine Bosworth, Ravi, MD, 500 mg at 08/19/20 0500 .  bisacodyl (DULCOLAX) EC tablet 5 mg, 5 mg, Oral, Daily PRN, Caren Griffins, MD, 5 mg at 08/20/20 0255 .  cyclobenzaprine (FLEXERIL) tablet 5 mg, 5 mg, Oral, TID PRN, Caren Griffins, MD, 5 mg at 08/20/20 1058 .  diclofenac Sodium (VOLTAREN) 1 % topical gel 2 g, 2 g, Topical, QID, Pokhrel, Laxman, MD .  hydrALAZINE (APRESOLINE) injection 10 mg, 10 mg, Intravenous, Once, Pfeiffer, Marcy, MD .  hydrALAZINE (APRESOLINE) injection 10 mg, 10 mg, Intravenous, Q4H PRN, Darliss Cheney, MD .  lactated ringers infusion, , Intravenous, Continuous, Pfeiffer, Marcy, MD, Last Rate: 125 mL/hr at 08/19/20 1700, New Bag at 08/19/20 1700 .  morphine 2 MG/ML injection 2 mg, 2 mg, Intravenous, Q2H PRN, Charlesetta Shanks, MD, 2 mg at 08/20/20 0255 .  ondansetron (ZOFRAN) injection 4 mg, 4 mg, Intravenous, Q4H PRN, Charlesetta Shanks, MD, 4 mg at 08/18/20 0856 .  ondansetron (ZOFRAN) tablet 4 mg, 4 mg, Oral, Q6H PRN, 4 mg at 08/20/20 1539 **OR** ondansetron (ZOFRAN) injection 4 mg, 4 mg, Intravenous, Q6H PRN, Pahwani, Ravi,  MD .  oxyCODONE (Oxy IR/ROXICODONE) immediate release tablet 5 mg, 5 mg, Oral, Q4H PRN, Darliss Cheney, MD, 5 mg at 08/20/20 1535 .  pantoprazole (PROTONIX) EC tablet 40 mg, 40 mg, Oral, Daily, Pahwani, Ravi, MD, 40 mg at 08/20/20 1058  Past Medical History:  Diagnosis Date  . GERD (gastroesophageal reflux disease)   . Hx of lumbosacral spine surgery t  . Hyperlipidemia   . Hypertension   . Iliac artery aneurysm ([place])   . Lumbar spinal stenosis   . Polymyalgia rheumatica ([place]) 06/2016    Past Surgical History:  Procedure Laterality Date  . JOINT REPLACEMENT  1992   right hip replacement  . JOINT REPLACEMENT  2002   left hip replacement  . ROTATOR CUFF REPAIR  2002   Left shoulder  . SPINE SURGERY  2006 & 2010   posterior spinal fusion L4-5 L5S1    Family History  Problem Relation Age of Onset  . Heart disease Mother   . Heart disease Father        Heart Disease before age 46  . Heart attack Father   . Hyperlipidemia Father   . Heart disease Sister   . Diabetes Sister   . Heart disease Brother   . Hyperlipidemia Brother   . Heart disease Brother   . Cancer Brother     Social History:  reports that she has never smoked. She has never used smokeless tobacco. She reports  that she does not drink alcohol and does not use drugs.  Physical Exam Blood pressure 140/73, pulse 75, temperature 97.8 F (36.6 C), temperature source Oral, resp. rate 20, SpO2 98 %. General: No acute distress, alert and oriented HEENT: Normocephalic.  Extraocular movements intact.  Pupils equal round reactive to light.  She has quite a bit of bruising around the right eye but the swelling looks to have gone down a bit.  I do not see any obvious enophthalmos or exophthalmos at this point.  She reports her teeth fit together normally.  There is no lacerations.  Cranial nerves grossly intact.  Results for orders placed or performed during the hospital encounter of 08/18/20 (from the past 48 hour(s))   Basic metabolic panel     Status: Abnormal   Collection Time: 08/19/20 12:57 AM  Result Value Ref Range   Sodium 139 135 - 145 mmol/L   Potassium 3.8 3.5 - 5.1 mmol/L   Chloride 106 98 - 111 mmol/L   CO2 27 22 - 32 mmol/L   Glucose, Bld 110 (H) 70 - 99 mg/dL    Comment: Glucose reference range applies only to samples taken after fasting for at least 8 hours.   BUN 10 8 - 23 mg/dL   Creatinine, Ser 0.85 0.44 - 1.00 mg/dL   Calcium 9.1 8.9 - 10.3 mg/dL   GFR, Estimated >60 >60 mL/min    Comment: (NOTE) Calculated using the CKD-EPI Creatinine Equation (2021)    Anion gap 6 5 - 15    Comment: Performed at [facility] [address], [place], [place] [address]  CBC     Status: None   Collection Time: 08/19/20 12:57 AM  Result Value Ref Range   WBC 6.8 4.0 - 10.5 K/uL   RBC 4.00 3.87 - 5.11 MIL/uL   Hemoglobin 12.3 12.0 - 15.0 g/dL   HCT 38.0 36.0 - 46.0 %   MCV 95.0 80.0 - 100.0 fL   MCH 30.8 26.0 - 34.0 pg   MCHC 32.4 30.0 - 36.0 g/dL   RDW 13.5 11.5 - 15.5 %   Platelets 174 150 - 400 K/uL   nRBC 0.0 0.0 - 0.2 %    Comment: Performed at [facility], [place] [address], [place], [place] [address]    No results found.  Assessment/Plan: Patient presents after ground-level fall with the right orbital floor blowout fracture.  I would like for a few more days to pass to allow some additional swelling to go down.  I suspect that we will need to fix this but we will plan to watch the swelling in the eye position over the next couple days which may help [place] decide.  I explained the procedure in detail to the patient and her caregiver in the room.  It is tentatively scheduled for Friday and if she still in inpatient by then we will plan to do it as an inpatient.  If she is ready for discharge she can always be discharged and then come back on Friday.  In the meantime can elevate the head of bed and ice as needed.  Call with any questions.  Cindra Presume 08/20/2020, 6:27 PM

## 2020-08-20 NOTE — TOC CAGE-AID Note (Addendum)
Transition of Care [facility]) - CAGE-AID Screening   Patient Details  Name: Sydney [name] MRN: [id number] Date of Birth: 29-Dec-1932  Transition of [facility]) CM/SW Contact:    Clovis Cao, RN Phone Number: [phone] 08/20/2020, 9:13 AM   Clinical Narrative: Pt had mechanical fall in her home.  Pt denies alcohol or drug use.   CAGE-AID Screening:    Have You Ever Felt You Ought to Cut Down on Your Drinking or Drug Use?: No Have People Annoyed You By Critizing Your Drinking Or Drug Use?: No Have You Felt Bad Or Guilty About Your Drinking Or Drug Use?: No Have You Ever Had a Drink or Used Drugs First Thing In The Morning to Steady Your Nerves or to Get Rid of a Hangover?: No CAGE-AID Score: 0  Substance Abuse Education Offered: No

## 2020-08-20 NOTE — Progress Notes (Signed)
PROGRESS NOTE  Sydney [name] KVQ:[id number] DOB: 05/18/1932 DOA: 08/18/2020 PCP: Venia Carbon, MD   LOS: 0 days   Brief narrative:  87 y.o.femalewith past medical history of GERD, hypertension, hyperlipidemia was brought into the hospital after sustaining a fall.  Patient fell and hit her head.  There was no syncope or loss of consciousness and it was basically a mechanical fall.  Patient was then admitted to hospital for further evaluation and treatment.   Assessment/Plan:   Active Problems:   Fall   Right orbital fracture, closed, initial encounter [facility])  Right orbital fracture/retrobulbar hemorrhage right- has been seen by ophthalmology Dr. Gillian Scarce in the ED and there is no need for acute intervention.  Continue supportive care.  [facility].  Patient was seen by plastic surgery and plastic surgery will follow the patient in the clinic.  Continue to hold aspirin.    Debility, deconditioning, decreased weightbearing on the right side with nonweightbearing status of the right wrist.  Patient has been difficulty ambulating and is not stable for discharge unsafe disposition at this time.  Spoke with PT OT who recommended further rehabilitation needs for the patient.    Right wrist fracture.  Spoke with orthopedics.  Recommended nonweightbearing on the wrist but weightbearing through elbow.  Recommended short arm splint.  Cord compression.  Follow-up with Dr. Claudia Desanctis as outpatient.  Minimal punctate traumatic intracranial hemorrhage and subarachnoid hemorrhage with no mass-patient has been seen by neurosurgery.  No intervention planned.  Conservative treatment.    Sinus bradycardia-Unknown whether she has history of bradycardia in the past. Beta-blockers on hold.  Essential hypertension- normotensive off nadolol.  Continue to hold for bradycardia.   DVT prophylaxis: SCDs Start: 08/18/20 1429   Code Status: DNR  Family Communication:  Spoke with the patient's family at bedside  Status is: Observation  The patient will require care spanning > 2 midnights and should be moved to inpatient because: Unsafe d/c plan, IV treatments appropriate due to intensity of illness or inability to take PO and Inpatient level of care appropriate due to severity of illness  Dispo: The patient is from: Home              Anticipated d/c is to: Home, might need rehabilitation              Patient currently is not medically stable to d/c.   Difficult to place patient No   Consultants: Neurosurgery Ophthalmology Trauma surgery  Procedures:  None  Anti-infectives:  . None  Anti-infectives (From admission, onward)   None     Subjective: Today, patient was seen and examined at bedside.  Complains of swelling and pain over the right wrist.  Difficulty ambulating and pain over the groin.  Objective: Vitals:   08/20/20 0535 08/20/20 1424  BP: 135/62 140/73  Pulse: 62 75  Resp: 16 20  Temp: 99.9 F (37.7 C) 97.8 F (36.6 C)  SpO2: 94% 98%    Intake/Output Summary (Last 24 hours) at 08/20/2020 1441 Last data filed at 08/20/2020 1100 Gross per 24 hour  Intake 460 ml  Output 900 ml  Net -440 ml   There were no vitals filed for this visit. There is no height or weight on file to calculate BMI.   Physical Exam: GENERAL: Patient is alert awake and oriented. Not in obvious distress. HENT: No scleral pallor or icterus. Pupils equally reactive to light. Oral mucosa is moist.  Right eye ecchymosis NECK: is supple,  no gross swelling noted. CHEST: Clear to auscultation. No crackles or wheezes.  Diminished breath sounds bilaterally. CVS: S1 and S2 heard, no murmur. Regular rate and rhythm.  ABDOMEN: Soft, non-tender, bowel sounds are present. EXTREMITIES: No edema.  Right wrist with some edema and tenderness. CNS: Cranial nerves are intact. No focal motor deficits. SKIN: warm and dry without rashes.  Data Review: I have personally  reviewed the following laboratory data and studies,  CBC: Recent Labs  Lab 08/18/20 0837 08/18/20 0852 08/19/20 0057  WBC 9.2  --  6.8  NEUTROABS 7.3  --   --   HGB 15.2* 15.0 12.3  HCT 46.4* 44.0 38.0  MCV 93.5  --  95.0  PLT 198  --  782   Basic Metabolic Panel: Recent Labs  Lab 08/18/20 0837 08/18/20 0852 08/19/20 0057  NA 140 141 139  K 4.3 4.2 3.8  CL 108 110 106  CO2 21*  --  27  GLUCOSE 135* 135* 110*  BUN 13 14 10   CREATININE 0.89 0.80 0.85  CALCIUM 9.6  --  9.1   Liver Function Tests: Recent Labs  Lab 08/18/20 0837  AST 23  ALT 13  ALKPHOS 41  BILITOT 1.2  PROT 6.5  ALBUMIN 3.8   No results for input(s): LIPASE, AMYLASE in the last 168 hours. No results for input(s): AMMONIA in the last 168 hours. Cardiac Enzymes: No results for input(s): CKTOTAL, CKMB, CKMBINDEX, TROPONINI in the last 168 hours. BNP (last 3 results) No results for input(s): BNP in the last 8760 hours.  ProBNP (last 3 results) No results for input(s): PROBNP in the last 8760 hours.  CBG: No results for input(s): GLUCAP in the last 168 hours. Recent Results (from the past 240 hour(s))  Resp Panel by RT-PCR (Flu A&B, Covid) Nasopharyngeal Swab     Status: None   Collection Time: 08/18/20  9:11 AM   Specimen: Nasopharyngeal Swab; Nasopharyngeal(NP) swabs in vial transport medium  Result Value Ref Range Status   SARS Coronavirus 2 by RT PCR NEGATIVE NEGATIVE Final    Comment: (NOTE) SARS-CoV-2 target nucleic acids are NOT DETECTED.  The SARS-CoV-2 RNA is generally detectable in upper respiratory specimens during the acute phase of infection. The lowest concentration of SARS-CoV-2 viral copies this assay can detect is 138 copies/mL. A negative result does not preclude SARS-Cov-2 infection and should not be used as the sole basis for treatment or other patient management decisions. A negative result may occur with  improper specimen collection/handling, submission of specimen  other than nasopharyngeal swab, presence of viral mutation(s) within the areas targeted by this assay, and inadequate number of viral copies(<138 copies/mL). A negative result must be combined with clinical observations, patient history, and epidemiological information. The expected result is Negative.  Fact Sheet for Patients:  EntrepreneurPulse.com.au  Fact Sheet for Healthcare Providers:  IncredibleEmployment.be  This test is no t yet approved or cleared by the [place] FDA and  has been authorized for detection and/or diagnosis of SARS-CoV-2 by FDA under an Emergency Use Authorization (EUA). This EUA will remain  in effect (meaning this test can be used) for the duration of the COVID-19 declaration under Section 564(b)(1) of the Act, 21 U.S.C.section 360bbb-3(b)(1), unless the authorization is terminated  or revoked sooner.       Influenza A by PCR NEGATIVE NEGATIVE Final   Influenza B by PCR NEGATIVE NEGATIVE Final    Comment: (NOTE) The Xpert Xpress SARS-CoV-2/FLU/RSV plus assay is intended as an  aid in the diagnosis of influenza from Nasopharyngeal swab specimens and should not be used as a sole basis for treatment. Nasal washings and aspirates are unacceptable for Xpert Xpress SARS-CoV-2/FLU/RSV testing.  Fact Sheet for Patients: EntrepreneurPulse.com.au  Fact Sheet for Healthcare Providers: IncredibleEmployment.be  This test is not yet approved or cleared by the [place] FDA and has been authorized for detection and/or diagnosis of SARS-CoV-2 by FDA under an Emergency Use Authorization (EUA). This EUA will remain in effect (meaning this test can be used) for the duration of the COVID-19 declaration under Section 564(b)(1) of the Act, 21 U.S.C. section 360bbb-3(b)(1), unless the authorization is terminated or revoked.  Performed at [facility], [place] [address], [place],  [place] [address]      Studies: No results found.    Flora Lipps, MD  [facility] 08/20/2020  If 7PM-7AM, please contact night-coverage

## 2020-08-20 NOTE — Telephone Encounter (Signed)
I tried to reach her at [facility]--but she didn't answer. Discussed the situation with the son via phone---may need surgery on the fractured orbit after all (so could be in the hospital a few more days). Will likely need rehab after

## 2020-08-20 NOTE — Progress Notes (Addendum)
Physical Therapy Treatment Patient Details Name: Sydney [name] MRN: [id number] DOB: Sep 17, 1932 Today's Date: 08/20/2020    History of Present Illness 87 y.o. female admitted 08/18/20 after mechanical fall resulting in R orbital fx and R sided exophthalmos, minimal punctate traumatic intercranial hemorrhage and subarachnoid hemorrhage with no mass effect, R wrist fx noted on xray ( 08/18/20) with wrist cock-up splint in place.  VPX:TGGY, HTN, hyperlipidemia, pelvic fx (2022), B THR, Spinal Stenosis, lumbar fusion x 2, polymyalgia rheumatica.    PT Comments    Pt supine in bed on arrival.  Pt in more pain then previous session.  Will need order for weight bearing to progress patient with DME due to R wrist fx.  Asked MD for ortho consult and sling for R shoulder comfort.  Pt is not progressing as well as we intially hoped and therefor now recommending aggressive rehab at [facility] before returning home.  Pt has support from her daughter who is in town with no date to return home at this time.  Will inform supervising PT of need for change in recommendations at this time.    Follow Up Recommendations  CIR     Equipment Recommendations   (TBA)    Recommendations for Other Services Rehab consult (Ortho consult.)     Precautions / Restrictions Precautions Precautions: Fall Precaution Comments: Proceeding with NWB on RUE until otherwise noted. Required Braces or Orthoses: Splint/Cast Splint/Cast: R cockup wrist splint. Restrictions Weight Bearing Restrictions: No (asked MD and RN to clarify weight bearing to RUE, waiting on answer.) Other Position/Activity Restrictions: Assumed NWB to RUE today due to extreme pain in R UE with touch or movement.    Mobility  Bed Mobility Overal bed mobility: Needs Assistance Bed Mobility: Supine to Sit     Supine to sit: Mod assist     General bed mobility comments: Mod assistance to move B LEs to edge of bed and elevate her trunk into sitting.  Pt very  painful with movement this session.  Presents with posterior bias sitting edge of bed.    Transfers Overall transfer level: Needs assistance Equipment used: Rolling walker (2 wheeled) Transfers: Sit to/from Stand Sit to Stand: Mod assist;+2 physical assistance Stand pivot transfers: Mod assist;+2 physical assistance       General transfer comment: Verbal cues for hand placement, assistance to boost into standing and assistance to maintain standing.  Performed stand pivot to commode.  From commode stood and had Commode removed and chair placed behind the patient.  Ambulation/Gait Ambulation/Gait assistance:  (Unable this session due to R wrist and R leg pain.)               Stairs             Wheelchair Mobility    Modified Rankin (Stroke Patients Only)       Balance Overall balance assessment: Needs assistance Sitting-balance support: Feet supported Sitting balance-Leahy Scale: Poor       Standing balance-Leahy Scale: Poor Standing balance comment: requires external support                            Cognition Arousal/Alertness: Lethargic Behavior During Therapy: Flat affect Overall Cognitive Status: Impaired/Different from baseline Area of Impairment: Following commands;Safety/judgement;Problem solving                       Following Commands: Follows one step commands with increased time Safety/Judgement: Decreased awareness  of safety;Decreased awareness of deficits   Problem Solving: Slow processing;Difficulty sequencing;Requires verbal cues;Requires tactile cues General Comments: Pt slow to respond and remains internally distracted by her pain.      Exercises General Exercises - Lower Extremity Ankle Circles/Pumps: AROM;Both;10 reps;Supine Quad Sets: AROM;Both;10 reps;Supine Heel Slides: AAROM;Right;10 reps;Supine Hip ABduction/ADduction: AAROM;Right;10 reps;Supine    General Comments        Pertinent Vitals/Pain Pain  Assessment: 0-10 Pain Score: 8  Pain Location: R groin. R knee, R shoulder and R wrist. Pain Descriptors / Indicators: Grimacing;Guarding;Restless;Throbbing Pain Intervention(s): Monitored during session;Repositioned;Ice applied    Home Living                      Prior Function            PT Goals (current goals can now be found in the care plan section) Acute Rehab PT Goals Patient Stated Goal: to have less pain Potential to Achieve Goals: Good Progress towards PT goals: Progressing toward goals    Frequency    Min 5X/week      PT Plan Discharge plan needs to be updated    Co-evaluation              AM-PAC PT "6 Clicks" Mobility   Outcome Measure  Help needed turning from your back to your side while in a flat bed without using bedrails?: A Lot Help needed moving from lying on your back to sitting on the side of a flat bed without using bedrails?: A Lot Help needed moving to and from a bed to a chair (including a wheelchair)?: A Lot Help needed standing up from a chair using your arms (e.g., wheelchair or bedside chair)?: A Lot Help needed to walk in hospital room?: Total Help needed climbing 3-5 steps with a railing? : Total 6 Click Score: 10    End of Session Equipment Utilized During Treatment: Gait belt (L wrist splint.)   Patient left: in chair;with call bell/phone within reach;with family/visitor present Nurse Communication: Mobility status PT Visit Diagnosis: Unsteadiness on feet (R26.81);Other abnormalities of gait and mobility (R26.89);Pain     Time: 9678-9381 PT Time Calculation (min) (ACUTE ONLY): 31 min  Charges:  $Therapeutic Exercise: 8-22 mins $Therapeutic Activity: 8-22 mins                     Erasmo Leventhal , PTA Acute Rehabilitation Services Pager [phone] Office [place] 08/20/2020, 3:28 PM

## 2020-08-20 NOTE — Telephone Encounter (Signed)
Mr. Dovidio called in and wanted to let Dr. Silvio Pate know that mom fell Saturday and she has a fractured right wrist, eye socket, and she bad pain in her leg. And she is at [facility] in [place]: [phone]. And Mr. [name] wanted to let Dr. Silvio Pate

## 2020-08-20 NOTE — Progress Notes (Signed)
Occupational Therapy Treatment Patient Details Name: Sydney [name] MRN: [id number] DOB: 1932-04-20 Today's Date: 08/20/2020    History of present illness 87 y.o. female admitted 08/18/20 after mechanical fall resulting in R orbital fx and R sided exophthalmos, minimal punctate traumatic intercranial hemorrhage and subarachnoid hemorrhage with no mass effect, R wrist fx noted on xray ( 08/18/20) with wrist cock-up splint in place.  LOV:FIEP, HTN, hyperlipidemia, pelvic fx (2022), B THR, Spinal Stenosis, lumbar fusion x 2, polymyalgia rheumatica.   OT comments  Pt experiencing increased pain this session, limiting her ability to progress functional mobility and ADL's. Pt required mod +2 for transfers and all basic self care tasks were limited due to pain in RUE and needed to be completed once seated and supported in recliner. OT focused end of session on repositioning pt and educating on the importance of mobilizing RUE at the fingers and elbow once pain decreased. Additionally OT educated daughter on doffing and donning wrist splint. OT to continue following.    Follow Up Recommendations  CIR    Equipment Recommendations  None recommended by OT    Recommendations for Other Services Rehab consult    Precautions / Restrictions Precautions Precautions: Fall Precaution Comments: Proceeding with NWB on RUE until otherwise noted. Required Braces or Orthoses: Splint/Cast Splint/Cast: R cockup wrist splint. Restrictions Weight Bearing Restrictions: No (asked MD and RN to clarify weight bearing to RUE, waiting on answer.) Other Position/Activity Restrictions: Assumed NWB to RUE today due to extreme pain in R UE with touch or movement.       Mobility Bed Mobility Overal bed mobility: Needs Assistance Bed Mobility: Supine to Sit     Supine to sit: Mod assist     General bed mobility comments: Transferring to [facility] on entry    Transfers Overall transfer level: Needs assistance Equipment  used: Rolling walker (2 wheeled) Transfers: Sit to/from [facility] Sit to Stand: Mod assist;+2 physical assistance Stand pivot transfers: Mod assist;+2 physical assistance       General transfer comment: Verbal cues for hand placement, assistance to boost into standing and assistance to maintain standing.  Performed stand pivot to commode.  From commode stood and had Commode removed and chair placed behind the patient.    Balance Overall balance assessment: Needs assistance Sitting-balance support: Feet supported Sitting balance-Leahy Scale: Fair     Standing balance support: Bilateral upper extremity supported Standing balance-Leahy Scale: Poor Standing balance comment: requires external support                           ADL either performed or assessed with clinical judgement   ADL Overall ADL's : Needs assistance/impaired Eating/Feeding: Set up;Sitting Eating/Feeding Details (indicate cue type and reason): Pt able to bring cup to her mouth and drink Grooming: Wash/dry face;Wash/dry hands;Set up;Sitting Grooming Details (indicate cue type and reason): completed in recliner                 Toilet Transfer: Minimal assistance;Stand-pivot;BSC Toilet Transfer Details (indicate cue type and reason): Pt was in a lot of pain requiring assistance to stand and pivot to [facility] Toileting- Clothing Manipulation and Hygiene: Min guard;Sitting/lateral lean Toileting - Clothing Manipulation Details (indicate cue type and reason): Pt able to complete toileting hygiene, needed min guard for safety     Functional mobility during ADLs: Minimal assistance;Rolling walker General ADL Comments: Pt unable to ambulate this session, she stood and pivoted to [facility] then stood  again while the [facility] was replaced with the recliner. Functional mobility was limited due to pain.     Vision       Perception     Praxis      Cognition Arousal/Alertness: Lethargic Behavior  During Therapy: Flat affect Overall Cognitive Status: Impaired/Different from baseline Area of Impairment: Following commands;Safety/judgement;Problem solving                       Following Commands: Follows one step commands with increased time Safety/Judgement: Decreased awareness of safety;Decreased awareness of deficits   Problem Solving: Slow processing;Difficulty sequencing;Requires verbal cues;Requires tactile cues General Comments: Pt slow to respond and remains internally distracted by her pain.        Exercises General Exercises - Lower Extremity Ankle Circles/Pumps: AROM;Both;10 reps;Supine Quad Sets: AROM;Both;10 reps;Supine Heel Slides: AAROM;Right;10 reps;Supine Hip ABduction/ADduction: AAROM;Right;10 reps;Supine   Shoulder Instructions       General Comments VSS on RA    Pertinent Vitals/ Pain       Pain Assessment: 0-10 Pain Score: 8  Pain Location: R groin. R knee, R shoulder and R wrist. Pain Descriptors / Indicators: Grimacing;Guarding;Restless;Throbbing Pain Intervention(s): Limited activity within patient's tolerance;Monitored during session;Repositioned;Ice applied;Patient requesting pain meds-RN notified  Home Living                                          Prior Functioning/Environment              Frequency  Min 2X/week        Progress Toward Goals  OT Goals(current goals can now be found in the care plan section)  Progress towards OT goals: Not progressing toward goals - comment (Pt pain has increased today limiting her ability to complete functional mobility and ADL's)  Acute Rehab OT Goals Patient Stated Goal: to have less pain OT Goal Formulation: With patient/family Time For Goal Achievement: 09/02/20 Potential to Achieve Goals: Good ADL Goals Pt Will Perform Grooming: with min guard assist;standing Pt Will Perform Lower Body Bathing: with min guard assist;sit to/from stand Pt Will Perform Lower  Body Dressing: with min guard assist;sit to/from stand Pt Will Transfer to Toilet: with min guard assist;ambulating;regular height toilet;bedside commode;grab bars Pt Will Perform Toileting - Clothing Manipulation and hygiene: with min guard assist;sit to/from stand  Plan Discharge plan needs to be updated;Frequency remains appropriate    Co-evaluation                 AM-PAC OT "6 Clicks" Daily Activity     Outcome Measure   Help from another person eating meals?: A Little Help from another person taking care of personal grooming?: A Little Help from another person toileting, which includes using toliet, bedpan, or urinal?: A Lot Help from another person bathing (including washing, rinsing, drying)?: A Lot Help from another person to put on and taking off regular upper body clothing?: A Little Help from another person to put on and taking off regular lower body clothing?: A Lot 6 Click Score: 15    End of Session Equipment Utilized During Treatment: Gait belt;Rolling walker  OT Visit Diagnosis: Unsteadiness on feet (R26.81);Pain;Muscle weakness (generalized) (M62.81) Pain - Right/Left: Right Pain - part of body: Arm;Hand   Activity Tolerance Patient limited by pain   Patient Left in chair;with call bell/phone within reach;with family/visitor present   Nurse Communication Mobility status;Patient  requests pain meds        Time: 1416-1450 OT Time Calculation (min): 34 min  Charges: OT General Charges $OT Visit: 1 Visit OT Treatments $Self Care/Home Management : 23-37 mins  Mikah Poss H., OTR/L [place] 08/20/2020, 3:52 PM

## 2020-08-20 NOTE — Progress Notes (Signed)
Orthopedic Tech Progress Note Patient Details:  Sydney [name] 07-Apr-1933 [id number] Dropped shoulder immobilizer off to patient Ortho Devices Type of Ortho Device: Shoulder immobilizer Ortho Device/Splint Location: RUE Ortho Device/Splint Interventions: Ordered   Post Interventions Patient Tolerated: Well Instructions Provided: Care of device   Germaine Pomfret 08/20/2020, 5:20 PM

## 2020-08-20 NOTE — Progress Notes (Signed)
Inpatient Rehab Admissions Coordinator Note:   Per updated therapy recommendations, pt was screened for CIR candidacy by Shann Medal, PT, DPT.  At this time we are recommending a CIR consult and I will place an order per our protocol.  Please contact me with questions.   Shann Medal, PT, DPT [phone] 08/20/20 4:27 PM

## 2020-08-20 NOTE — Consult Note (Signed)
Reason for Consult/CC: Right orbital floor fracture  Sydney [name] is an 87 y.o. female.  HPI: Patient presented after ground-level fall this past weekend.  She sustained a right orbital floor fracture in addition to other injuries.  She did have her eye evaluated in the emergency room by ophthalmology who felt that no acute intervention was required.  She says her vision is done well since then and feels like the swelling is gone down quite a bit.  She does have some other orthopedic injuries and a wrist fracture that are causing her pain and limit her ability to get up and around.  She denies any double vision right now.  Allergies:  Allergies  Allergen Reactions  . Lipitor [Atorvastatin]     Dizzy ( fuzziness)  . Latex Rash    Medications:  Current Facility-Administered Medications:  .  acetaminophen (TYLENOL) tablet 500 mg, 500 mg, Oral, Q6H PRN, Doristine Bosworth, Ravi, MD, 500 mg at 08/19/20 0500 .  bisacodyl (DULCOLAX) EC tablet 5 mg, 5 mg, Oral, Daily PRN, Caren Griffins, MD, 5 mg at 08/20/20 0255 .  cyclobenzaprine (FLEXERIL) tablet 5 mg, 5 mg, Oral, TID PRN, Caren Griffins, MD, 5 mg at 08/20/20 1058 .  diclofenac Sodium (VOLTAREN) 1 % topical gel 2 g, 2 g, Topical, QID, Pokhrel, Laxman, MD .  hydrALAZINE (APRESOLINE) injection 10 mg, 10 mg, Intravenous, Once, Pfeiffer, Marcy, MD .  hydrALAZINE (APRESOLINE) injection 10 mg, 10 mg, Intravenous, Q4H PRN, Darliss Cheney, MD .  lactated ringers infusion, , Intravenous, Continuous, Pfeiffer, Marcy, MD, Last Rate: 125 mL/hr at 08/19/20 1700, New Bag at 08/19/20 1700 .  morphine 2 MG/ML injection 2 mg, 2 mg, Intravenous, Q2H PRN, Charlesetta Shanks, MD, 2 mg at 08/20/20 0255 .  ondansetron (ZOFRAN) injection 4 mg, 4 mg, Intravenous, Q4H PRN, Charlesetta Shanks, MD, 4 mg at 08/18/20 0856 .  ondansetron (ZOFRAN) tablet 4 mg, 4 mg, Oral, Q6H PRN, 4 mg at 08/20/20 1539 **OR** ondansetron (ZOFRAN) injection 4 mg, 4 mg, Intravenous, Q6H PRN, Pahwani, Ravi,  MD .  oxyCODONE (Oxy IR/ROXICODONE) immediate release tablet 5 mg, 5 mg, Oral, Q4H PRN, Darliss Cheney, MD, 5 mg at 08/20/20 1535 .  pantoprazole (PROTONIX) EC tablet 40 mg, 40 mg, Oral, Daily, Pahwani, Ravi, MD, 40 mg at 08/20/20 1058  Past Medical History:  Diagnosis Date  . GERD (gastroesophageal reflux disease)   . Hx of lumbosacral spine surgery t  . Hyperlipidemia   . Hypertension   . Iliac artery aneurysm ([place])   . Lumbar spinal stenosis   . Polymyalgia rheumatica ([place]) 06/2016    Past Surgical History:  Procedure Laterality Date  . JOINT REPLACEMENT  1992   right hip replacement  . JOINT REPLACEMENT  2002   left hip replacement  . ROTATOR CUFF REPAIR  2002   Left shoulder  . SPINE SURGERY  2006 & 2010   posterior spinal fusion L4-5 L5S1    Family History  Problem Relation Age of Onset  . Heart disease Mother   . Heart disease Father        Heart Disease before age 46  . Heart attack Father   . Hyperlipidemia Father   . Heart disease Sister   . Diabetes Sister   . Heart disease Brother   . Hyperlipidemia Brother   . Heart disease Brother   . Cancer Brother     Social History:  reports that she has never smoked. She has never used smokeless tobacco. She reports  that she does not drink alcohol and does not use drugs.  Physical Exam Blood pressure 140/73, pulse 75, temperature 97.8 F (36.6 C), temperature source Oral, resp. rate 20, SpO2 98 %. General: No acute distress, alert and oriented HEENT: Normocephalic.  Extraocular movements intact.  Pupils equal round reactive to light.  She has quite a bit of bruising around the right eye but the swelling looks to have gone down a bit.  I do not see any obvious enophthalmos or exophthalmos at this point.  She reports her teeth fit together normally.  There is no lacerations.  Cranial nerves grossly intact.  Results for orders placed or performed during the hospital encounter of 08/18/20 (from the past 48 hour(s))   Basic metabolic panel     Status: Abnormal   Collection Time: 08/19/20 12:57 AM  Result Value Ref Range   Sodium 139 135 - 145 mmol/L   Potassium 3.8 3.5 - 5.1 mmol/L   Chloride 106 98 - 111 mmol/L   CO2 27 22 - 32 mmol/L   Glucose, Bld 110 (H) 70 - 99 mg/dL    Comment: Glucose reference range applies only to samples taken after fasting for at least 8 hours.   BUN 10 8 - 23 mg/dL   Creatinine, Ser 0.85 0.44 - 1.00 mg/dL   Calcium 9.1 8.9 - 10.3 mg/dL   GFR, Estimated >60 >60 mL/min    Comment: (NOTE) Calculated using the CKD-EPI Creatinine Equation (2021)    Anion gap 6 5 - 15    Comment: Performed at [facility] [address] [place], [place] [address]  CBC     Status: None   Collection Time: 08/19/20 12:57 AM  Result Value Ref Range   WBC 6.8 4.0 - 10.5 K/uL   RBC 4.00 3.87 - 5.11 MIL/uL   Hemoglobin 12.3 12.0 - 15.0 g/dL   HCT 38.0 36.0 - 46.0 %   MCV 95.0 80.0 - 100.0 fL   MCH 30.8 26.0 - 34.0 pg   MCHC 32.4 30.0 - 36.0 g/dL   RDW 13.5 11.5 - 15.5 %   Platelets 174 150 - 400 K/uL   nRBC 0.0 0.0 - 0.2 %    Comment: Performed at [facility], [place] [address], [place], [place] [address]    No results found.  Assessment/Plan: Patient presents after ground-level fall with the right orbital floor blowout fracture.  I would like for a few more days to pass to allow some additional swelling to go down.  I suspect that we will need to fix this but we will plan to watch the swelling in the eye position over the next couple days which may help [place] decide.  I explained the procedure in detail to the patient and her caregiver in the room.  It is tentatively scheduled for Friday and if she still in inpatient by then we will plan to do it as an inpatient.  If she is ready for discharge she can always be discharged and then come back on Friday.  In the meantime can elevate the head of bed and ice as needed.  Call with any questions.  Cindra Presume 08/20/2020, 6:27 PM

## 2020-08-21 DIAGNOSIS — I609 Nontraumatic subarachnoid hemorrhage, unspecified: Secondary | ICD-10-CM

## 2020-08-21 DIAGNOSIS — I629 Nontraumatic intracranial hemorrhage, unspecified: Secondary | ICD-10-CM

## 2020-08-21 DIAGNOSIS — S0285XA Fracture of orbit, unspecified, initial encounter for closed fracture: Secondary | ICD-10-CM

## 2020-08-21 DIAGNOSIS — W19XXXA Unspecified fall, initial encounter: Secondary | ICD-10-CM | POA: Diagnosis not present

## 2020-08-21 DIAGNOSIS — R5381 Other malaise: Secondary | ICD-10-CM | POA: Diagnosis not present

## 2020-08-21 DIAGNOSIS — S62101A Fracture of unspecified carpal bone, right wrist, initial encounter for closed fracture: Secondary | ICD-10-CM | POA: Diagnosis not present

## 2020-08-21 MED ORDER — SENNA 8.6 MG PO TABS
2.0000 | ORAL_TABLET | Freq: Every day | ORAL | Status: DC
  Administered 2020-08-21 – 2020-08-27 (×7): 17.2 mg via ORAL
  Filled 2020-08-21 (×7): qty 2

## 2020-08-21 MED ORDER — SORBITOL 70 % SOLN
30.0000 mL | Freq: Once | Status: AC
  Administered 2020-08-21: 30 mL via ORAL
  Filled 2020-08-21: qty 30

## 2020-08-21 NOTE — Progress Notes (Signed)
Inpatient Rehabilitation Admissions Coordinator  Inpatient rehab consult received, I met with patient and her daughter at bedside. We discussed goals and expectations of a possible CIR admit.Note possible plans for OR Friday but patient states unlikely to need it . I will follow up tomorrow with her therapy progress to assist with planning rehab venue options.  Barbara Boyette, RN, MSN Rehab Admissions Coordinator [phone] 08/21/2020 11:58 AM  

## 2020-08-21 NOTE — Consult Note (Signed)
Physical Medicine and Rehabilitation Consult Reason for Consult: Altered mental status with decreased functional mobility Referring Physician: Dr.Pokhrel   HPI: Sydney [name] is a 87 y.o. right-handed female with history of hypertension, hyperlipidemia, PMR, iliac artery aneurysm, bilateral hip replacements.  Per chart review patient lives alone.  Two-level home bed and bath main level with 3 steps to entry.  Independent with a walker.  She has a son and granddaughter locally can assist as needed.  She has a daughter from [place] who will be assisting for a short time on discharge.  Presented 08/18/2020 after mechanical fall while walking to the bathroom when she slipped and struck her head on a dresser.  Denied loss of consciousness.  Denied any chest pain or shortness of breath associated with the fall.  Cranial CT scan showed small amount of subarachnoid hemorrhage of the anterior left frontal lobe.  No other acute intracranial abnormality.  No skull fracture.  Patient sustained comminuted depressed right orbital floor fracture with fracture also involving the adjacent anterior inferior right medial orbital wall as well as right side exophthalmos.  CT cervical spine negative for fracture.  X-rays of right wrist showed subtle nondisplaced fracture of the distal radius metaphysis with an intra-articular component.  No dislocation.  Admission chemistries unremarkable except glucose 135.  Neurosurgery follow-up for small traumatic SAH/ICH Dr. Christella Noa no surgical intervention and monitor.  Follow-up plastic surgery Dr. Claudia Desanctis in regards to right orbital floor fracture and currently awaiting swelling to dissipate and consider surgical intervention which could be addressed as outpatient.  Conservative care of right nondisplaced fracture of distal radius nonweightbearing with cock up splint and no current surgical intervention.  Therapy evaluations completed due to patient decreased functional mobility  recommendations of physical medicine rehab consult.  Pt reports her LBM was least 5 days ago, maybe before and feels VERY constipated. Using Purewick, but urine dark amber in afternoon- admits not drinking well.  Has control of urine, just makes it easier to use Purewick.  Also found to have T of 100.1 when I was in room- educated on how to use ICS. Rates pain 4/10 right now- 7/10 when meds wear off at rest. Meds working well.     Review of Systems  Constitutional: Negative for chills and fever.  HENT: Negative for hearing loss.   Eyes: Positive for pain. Negative for blurred vision and double vision.  Respiratory: Negative for cough and shortness of breath.   Cardiovascular: Negative for chest pain, palpitations and leg swelling.  Gastrointestinal: Positive for constipation. Negative for heartburn, nausea and vomiting.       GERD  Genitourinary: Negative for dysuria, flank pain and hematuria.  Musculoskeletal: Positive for back pain, joint pain and myalgias.  Skin: Negative for rash.  Neurological: Positive for weakness.  All other systems reviewed and are negative.  Past Medical History:  Diagnosis Date  . GERD (gastroesophageal reflux disease)   . Hx of lumbosacral spine surgery t  . Hyperlipidemia   . Hypertension   . Iliac artery aneurysm ([place])   . Lumbar spinal stenosis   . Polymyalgia rheumatica ([place]) 06/2016   Past Surgical History:  Procedure Laterality Date  . JOINT REPLACEMENT  1992   right hip replacement  . JOINT REPLACEMENT  2002   left hip replacement  . ROTATOR CUFF REPAIR  2002   Left shoulder  . SPINE SURGERY  2006 & 2010   posterior spinal fusion L4-5 L5S1   Family History  Problem Relation Age of Onset  . Heart disease Mother   . Heart disease Father        Heart Disease before age 95  . Heart attack Father   . Hyperlipidemia Father   . Heart disease Sister   . Diabetes Sister   . Heart disease Brother   . Hyperlipidemia Brother   . Heart  disease Brother   . Cancer Brother    Social History:  reports that she has never smoked. She has never used smokeless tobacco. She reports that she does not drink alcohol and does not use drugs. Allergies:  Allergies  Allergen Reactions  . Lipitor [Atorvastatin]     Dizzy ( fuzziness)  . Latex Rash   Medications Prior to Admission  Medication Sig Dispense Refill  . acetaminophen (TYLENOL) 500 MG tablet Take 500 mg by mouth as needed for mild pain.     Marland Kitchen aspirin EC 81 MG tablet Take 81 mg by mouth daily. Swallow whole.    . furosemide (LASIX) 20 MG tablet Take 2 tablets (40 mg total) by mouth daily as needed. For morning leg swelling (Patient taking differently: Take 40 mg by mouth every other day.) 1 tablet 0  . nadolol (CORGARD) 40 MG tablet TAKE 1 TABLET BY MOUTH EVERY DAY (Patient taking differently: Take 40 mg by mouth daily.) 90 tablet 3  . pantoprazole (PROTONIX) 40 MG tablet Take 1 tablet (40 mg total) by mouth daily. 90 tablet 3  . calcium carbonate (OS-CAL - DOSED IN MG OF ELEMENTAL CALCIUM) 1250 (500 Ca) MG tablet Take 1 tablet (500 mg of elemental calcium total) by mouth 2 (two) times daily with a meal. 60 tablet 0    Home: Home Living Family/patient expects to be discharged to:: Private residence Living Arrangements: Alone,Other (Comment) (daughter will be staying with her at discharge (daughter lives in [place])) Type of Home: House Home Access: Stairs to enter Technical brewer of Steps: 3 Entrance Stairs-Rails: Left Home Layout: Able to live on main level with bedroom/bathroom Bathroom Shower/Tub: Walk-in shower,Other (comment) (walk in tub) Bathroom Toilet: Handicapped height Home Equipment: [place] - single point,Walker - 4 wheels,Shower seat Additional Comments: Pt's spouse died in 04/27/2022.  She has a son and grand daughter locally who assist as needed.  Functional History: Prior Function Level of Independence: Independent with assistive device(s) Comments: uses a  rollator in the house and a cane in the community.  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  Since the onset of the pandemic, she stopped driving and going out in the community.  She has a local son and grand daughter who provide transportation and get her groceries. Functional Status:  Mobility: Bed Mobility Overal bed mobility: Needs Assistance Bed Mobility: Supine to Sit Supine to sit: Mod assist Sit to supine: Mod assist General bed mobility comments: Transferring to [facility] on entry Transfers Overall transfer level: Needs assistance Equipment used: Rolling walker (2 wheeled) Transfers: Sit to/from [employer] Sit to Stand: Mod assist,+2 physical assistance Stand pivot transfers: Mod assist,+2 physical assistance General transfer comment: Verbal cues for hand placement, assistance to boost into standing and assistance to maintain standing.  Performed stand pivot to commode.  From commode stood and had Commode removed and chair placed behind the patient. Ambulation/Gait Ambulation/Gait assistance:  (Unable this session due to R wrist and R leg pain.) Gait Distance (Feet): 40 Feet (20+20) Assistive device: Rolling walker (2 wheeled) Gait Pattern/deviations: Step-to pattern General Gait Details: very  painful R hip and groin in stance; used RW to help unweigh painful RLE in stance; steps lenght becomes significantly shorter with incr pain and fatigue Gait velocity: quite slow Stairs: Yes Stairs assistance: Mod assist Stair Management: One rail Left,Step to pattern,Forwards Number of Stairs: 2 General stair comments: Attempted to go up with one or both hands on L rail, however, pt with too much pain in R groin while stepping LLE up; tehn used L hand on rail and RUE over the shoulder assist from PT; able to go up 2 steps in this fashion; pt exhausted and requested to stop at 2 steps    ADL: ADL Overall ADL's : Needs assistance/impaired Eating/Feeding: Set  up,Sitting Eating/Feeding Details (indicate cue type and reason): Pt able to bring cup to her mouth and drink Grooming: Wash/dry face,Wash/dry hands,Set up,Sitting Grooming Details (indicate cue type and reason): completed in recliner Upper Body Bathing: Minimal assistance,Sitting Lower Body Bathing: Moderate assistance,Sit to/from stand Upper Body Dressing : Minimal assistance,Sitting Lower Body Dressing: Moderate assistance,Sit to/from stand Lower Body Dressing Details (indicate cue type and reason): Pt able to don/doff Lt sock, but unable to don/doff Rt sock due to Rt groin pain Toilet Transfer: Minimal assistance,Stand-pivot,BSC Toilet Transfer Details (indicate cue type and reason): Pt was in a lot of pain requiring assistance to stand and pivot to [facility] Toileting- Clothing Manipulation and Hygiene: Min guard,Sitting/lateral lean Toileting - Clothing Manipulation Details (indicate cue type and reason): Pt able to complete toileting hygiene, needed min guard for safety Functional mobility during ADLs: Minimal assistance,Rolling walker General ADL Comments: Pt unable to ambulate this session, she stood and pivoted to [facility] then stood again while the [facility] was replaced with the recliner. Functional mobility was limited due to pain.  Cognition: Cognition Overall Cognitive Status: Impaired/Different from baseline Cognition Arousal/Alertness: Lethargic Behavior During Therapy: Flat affect Overall Cognitive Status: Impaired/Different from baseline Area of Impairment: Following commands,Safety/judgement,Problem solving Following Commands: Follows one step commands with increased time Safety/Judgement: Decreased awareness of safety,Decreased awareness of deficits Problem Solving: Slow processing,Difficulty sequencing,Requires verbal cues,Requires tactile cues General Comments: Pt slow to respond and remains internally distracted by her pain.  Blood pressure (!) 115/56, pulse 81, temperature 98.4  F (36.9 C), temperature source Oral, resp. rate 18, SpO2 94 %. Physical Exam Vitals and nursing note reviewed. Exam conducted with a chaperone present.  Constitutional:      Comments: Pt supine in bed- elderly women, daughter at bedside; appropriate, NAD; has Purewick  HENT:     Head: Normocephalic.     Comments: She has significant swelling and bruising around the right eye No exophthalmos noted in R eye- at this time    Right Ear: External ear normal.     Left Ear: External ear normal.     Nose: Nose normal. No congestion.     Mouth/Throat:     Mouth: Mucous membranes are dry.     Pharynx: Oropharynx is clear. No oropharyngeal exudate.  Eyes:     Extraocular Movements: Extraocular movements intact.     Comments: R eye very bruised, swollen around eye, but no exophthalmos noted- L eye looks normal  Cardiovascular:     Heart sounds: Normal heart sounds. No murmur heard. No gallop.      Comments: RRR- no JVD- rate in low 60s when seen Pulmonary:     Comments: CTA B/L- no W/R/R- good air movement Abdominal:     Comments: Soft, but NT, distended; normoactive BS  Genitourinary:    Comments:  purewick- container holds dark amber urine- admits drinking 2 cups of water/day today Musculoskeletal:     Cervical back: Normal range of motion and neck supple.     Comments: LUE 5/5 in B/T/WE/G and FA RUE- can wiggle fingers and shoulder shrug, but notes any shoulder/elbow movement painful in spite of R wrist brace LLE- 5/5 in HF,KE, DF and PF RLE- HF and KE 2-/5 due to pain; DF and PF 5-/5   Skin:    General: Skin is warm and dry.     Comments: No skin breakdown seen IV in UE looks OK  Neurological:     Comments: Patient is alert in no acute distress.  Makes eye contact with examiner.  Follows simple commands.  Provides name and age. Intact to light touch in all 4 extremities Ox3  Psychiatric:        Mood and Affect: Mood normal.        Behavior: Behavior normal.     No results  found for this or any previous visit (from the past 24 hour(s)). No results found.   Assessment/Plan: Diagnosis: R wrist fx and R orbital fx as well as SAH s/p mechanical fall 1. Does the need for close, 24 hr/day medical supervision in concert with the patient's rehab needs make it unreasonable for this patient to be served in a less intensive setting? Yes 2. Co-Morbidities requiring supervision/potential complications: polymyalgia rheumatica; SAH/ICH/R wrist fx NWB on R wrist/WB at elbow; severe LE pain; obrital fx- surgery Friday?; severe constipation 3. Due to bladder management, bowel management, safety, skin/wound care, disease management, medication administration, pain management and patient education, does the patient require 24 hr/day rehab nursing? Yes 4. Does the patient require coordinated care of a physician, rehab nurse, therapy disciplines of PT and OT to address physical and functional deficits in the context of the above medical diagnosis(es)? Yes Addressing deficits in the following areas: balance, endurance, locomotion, strength, transferring, bowel/bladder control, bathing, dressing, feeding, grooming and toileting 5. Can the patient actively participate in an intensive therapy program of at least 3 hrs of therapy per day at least 5 days per week? Yes 6. The potential for patient to make measurable gains while on inpatient rehab is good 7. Anticipated functional outcomes upon discharge from inpatient rehab are supervision and min assist  with PT, supervision and min assist with OT, n/a with SLP. 8. Estimated rehab length of stay to reach the above functional goals is: 14-18 days 9. Anticipated discharge destination: Home 10. Overall Rehab/Functional Prognosis: good  RECOMMENDATIONS: This patient's condition is appropriate for continued rehabilitative care in the following setting: CIR Patient has agreed to participate in recommended program. Potentially Note that insurance  prior authorization may be required for reimbursement for recommended care.  Comment:  1. Added Sorbitol 30 ml x1 this evening due ot lack of BM in 5-7 days 2. Also added Senokot 2 tabs daily- might need to increase dose.  3. Drink 6 cups of water daily, minimum 4. Also ordered and got at bedside an ICS- since has low grade temp. Might need additional w/u if temperature gets higher.  5. Pt is appropriate candidate at this time for inpt Rehab, however might need R eye surgery on Friday - per plastics- will see how she does over next few days.  6. Will submit for CIR admissions coordinators to get pt inpt CIR after surgery 7. Thank you for this consult   Cathlyn Parsons, PA-C 08/21/2020    I have  personally performed a face to face diagnostic evaluation of this patient and formulated the key components of the plan.  Additionally, I have personally reviewed laboratory data, imaging studies, as well as relevant notes and concur with the physician assistant's documentation above.

## 2020-08-21 NOTE — Progress Notes (Signed)
Physical Therapy Treatment Patient Details Name: Sydney [name] MRN: [id number] DOB: 02/20/1933 Today's Date: 08/21/2020    History of Present Illness 87 y.o. female admitted 08/18/20 after mechanical fall resulting in R orbital fx and R sided exophthalmos, minimal punctate traumatic intercranial hemorrhage and subarachnoid hemorrhage with no mass effect, R wrist fx noted on xray ( 08/18/20) with wrist cock-up splint in place (NWB through hand, ok to WB through elbow).  JKK:XFGH, HTN, hyperlipidemia, pelvic fx (2022), B THR, Spinal Stenosis, lumbar fusion x 2, polymyalgia rheumatica.    PT Comments    Pt was able to stand EOB and side step to the top of the bed with me today.  Min to mod assist overall and ready for gait progression away from the bed next session.  Pt was able to complete seated HEP and I encouraged incentive spirometer use and hourly ankle pumps for blood clot prevention. PT will continue to follow acutely for safe mobility progression.  Follow Up Recommendations  CIR     Equipment Recommendations  Rolling walker with 5" wheels;Other (comment);Hospital bed;3in1 (PT);Wheelchair (measurements PT);Wheelchair cushion (measurements PT) (R platform attachment)    Recommendations for Other Services       Precautions / Restrictions Precautions Precautions: Fall Required Braces or Orthoses: Splint/Cast Splint/Cast: R cockup wrist splint. (pre fab) Restrictions Weight Bearing Restrictions: Yes RUE Weight Bearing: Non weight bearing Other Position/Activity Restrictions: ok to WB through elbow on platform    Mobility  Bed Mobility Overal bed mobility: Needs Assistance Bed Mobility: Rolling;Supine to Sit;Sit to Supine Rolling: Mod assist   Supine to sit: Mod assist;HOB elevated Sit to supine: Mod assist;HOB elevated   General bed mobility comments: mod assist to come up to sitting EOB.  Assist to progress bil legs all the way over and support trunk to come up to sitting EOB,  mod assist to help weight shift to scoot all the way out.  Mod assist to help lift both legs back into bed when returning to supine, HOB elevated and heavy reliance on rail for leverage at trunk.    Transfers Overall transfer level: Needs assistance Equipment used: Right platform walker Transfers: Sit to/from Stand Sit to Stand: Min assist;From elevated surface;+2 safety/equipment         General transfer comment: verbal cues for safe use of R platform RW during transitions, min assist to come to standing from elevated bed, daughter stabilized RW for [place] as the second person.  Ambulation/Gait Ambulation/Gait assistance: Min assist Gait Distance (Feet): 3 Feet Assistive device: Right platform walker Gait Pattern/deviations: Step-to pattern     General Gait Details: pt was able to side step up to [facility] using R platform RW, she did not feel up for OOB right now.   Stairs             Wheelchair Mobility    Modified Rankin (Stroke Patients Only)       Balance Overall balance assessment: Needs assistance Sitting-balance support: Feet supported;Single extremity supported;No upper extremity supported Sitting balance-Leahy Scale: Good     Standing balance support: Bilateral upper extremity supported Standing balance-Leahy Scale: Poor Standing balance comment: requires external support                            Cognition Arousal/Alertness: Awake/alert Behavior During Therapy: WFL for tasks assessed/performed Overall Cognitive Status: Within Functional Limits for tasks assessed  General Comments: Not specifically tested, but followed commands, conversation normal, processing speed seemed normal today      Exercises General Exercises - Lower Extremity Ankle Circles/Pumps: AROM;Both;20 reps Long Arc Quad: AROM;Both;20 reps    General Comments General comments (skin integrity, edema, etc.): pt positioned on L  side at end of session as her bottom was a bit pink from being in bed.  barrier cream also applied on red areas.      Pertinent Vitals/Pain Pain Assessment: Faces Faces Pain Scale: Hurts whole lot Pain Location: R groin. R knee, R shoulder and R wrist. Pain Descriptors / Indicators: Grimacing;Guarding;Restless;Throbbing Pain Intervention(s): Limited activity within patient's tolerance;Monitored during session;Repositioned    Home Living                      Prior Function            PT Goals (current goals can now be found in the care plan section) Acute Rehab PT Goals Patient Stated Goal: to have less pain Progress towards PT goals: Progressing toward goals    Frequency    Min 5X/week      PT Plan Current plan remains appropriate    Co-evaluation              AM-PAC PT "6 Clicks" Mobility   Outcome Measure  Help needed turning from your back to your side while in a flat bed without using bedrails?: A Lot Help needed moving from lying on your back to sitting on the side of a flat bed without using bedrails?: A Lot Help needed moving to and from a bed to a chair (including a wheelchair)?: A Lot Help needed standing up from a chair using your arms (e.g., wheelchair or bedside chair)?: A Lot Help needed to walk in hospital room?: A Lot Help needed climbing 3-5 steps with a railing? : Total 6 Click Score: 11    End of Session Equipment Utilized During Treatment: Gait belt Activity Tolerance: Patient limited by pain Patient left: in bed;with call bell/phone within reach;with family/visitor present   PT Visit Diagnosis: Unsteadiness on feet (R26.81);Other abnormalities of gait and mobility (R26.89);Pain Pain - Right/Left: Right Pain - part of body: Arm     Time: 1740-8144 PT Time Calculation (min) (ACUTE ONLY): 39 min  Charges:  $Gait Training: 8-22 mins $Therapeutic Exercise: 8-22 mins $Therapeutic Activity: 8-22 mins                    Verdene Lennert, PT, DPT  Acute Rehabilitation [phone] pager [phone]) [phone] office

## 2020-08-21 NOTE — Progress Notes (Signed)
PROGRESS NOTE  ARKIE TAGLIAFERRO BPZ:[id number] DOB: 03-09-1933 DOA: 08/18/2020 PCP: Venia Carbon, MD   LOS: 0 days   Brief narrative:  87 y.o.femalewith past medical history of GERD, hypertension, hyperlipidemia was brought into the hospital after sustaining a fall.  Patient fell and hit her head.  There was no syncope or loss of consciousness and it was basically a mechanical fall.  Patient was then admitted to hospital for further evaluation and treatment.  Patient was noted to have right orbital blowout fracture, intracranial hemorrhage and wrist fracture.  Patient was seen by neurosurgery, plastic surgery and ophthalmology during hospitalization.  At this time, patient is awaiting for rehabilitation placement.    Assessment/Plan:   Active Problems:   Fall   Right orbital fracture, closed, initial encounter [facility])  Right orbital blowout fracture/retrobulbar hemorrhage right- Patient has been seen by ophthalmology Dr. Gillian Scarce in the ED and there is no need for acute intervention.  Continue supportive care.  Follow-up with the [facility].  Patient was seen by plastic surgery Dr Claudia Desanctis who recommends possible need for surgical intervention depending upon how she progresses over the next few days.  Debility, deconditioning, decreased weightbearing on the right side with nonweightbearing status of the right wrist.  Patient has been having difficulty ambulating and is not stable for discharge, unsafe disposition at this time.  Physical therapy recommending CIR assessment.  Right wrist fracture.  Spoke with orthopedics.  Recommended nonweightbearing on the wrist but weightbearing through elbow.  Recommended short arm splint.  Continue ice compression.  Follow-up with Dr. Claudia Desanctis as outpatient.   Minimal punctate traumatic intracranial hemorrhage and subarachnoid hemorrhage with no mass-patient has been seen by neurosurgery.  No intervention planned.  Conservative  treatment.    Sinus bradycardia-Unknown whether she has history of bradycardia in the past. Beta-blockers on hold.  Latest blood pressure of 15/56  Essential hypertension- normotensive off nadolol.  Continue to hold for bradycardia.   DVT prophylaxis: SCDs Start: 08/18/20 1429   Code Status: DNR  Family Communication:  I again spoke with the patient's family at bedside  Status is: Observation  The patient will require care spanning > 2 midnights and should be moved to inpatient because: Unsafe d/c plan, IV treatments appropriate due to intensity of illness or inability to take PO and Inpatient level of care appropriate due to severity of illness  Dispo: The patient is from: Home              Anticipated d/c is to: Home, might need rehabilitation.  CIR has been consulted              Patient currently is not medically stable to d/c.   Difficult to place patient No   Consultants: Neurosurgery Ophthalmology Trauma surgery  Procedures:  None  Anti-infectives:  . None  Anti-infectives (From admission, onward)   None     Subjective: Today, patient was seen and examined at bedside.  Feels little better than yesterday has less pain and swelling.  Objective: Vitals:   08/20/20 2348 08/21/20 0505  BP: (!) 106/57 (!) 115/56  Pulse: 81 81  Resp: 18 18  Temp: 98.6 F (37 C) 98.4 F (36.9 C)  SpO2: 93% 94%    Intake/Output Summary (Last 24 hours) at 08/21/2020 1314 Last data filed at 08/21/2020 1038 Gross per 24 hour  Intake 520 ml  Output 800 ml  Net -280 ml   There were no vitals filed for this visit. There is no  height or weight on file to calculate BMI.   Physical Exam: General:  Average built, not in obvious distress, alert awake and oriented HENT:   No scleral pallor or icterus noted. Oral mucosa is moist.  Right IJ ecchymosis Chest:  Clear breath sounds.  Diminished breath sounds bilaterally. No crackles or wheezes.  CVS: S1 &S2 heard. No murmur.   Regular rate and rhythm. Abdomen: Soft, nontender, nondistended.  Bowel sounds are heard.   Extremities: No cyanosis, clubbing or edema.  Peripheral pulses are palpable.  Right wrist with some edema on splint. Psych: Alert, awake and oriented, normal mood CNS:  No cranial nerve deficits.  Power equal in all extremities.   Skin: Warm and dry.  No rashes noted.   Data Review: I have personally reviewed the following laboratory data and studies,  CBC: Recent Labs  Lab 08/18/20 0837 08/18/20 0852 08/19/20 0057  WBC 9.2  --  6.8  NEUTROABS 7.3  --   --   HGB 15.2* 15.0 12.3  HCT 46.4* 44.0 38.0  MCV 93.5  --  95.0  PLT 198  --  811   Basic Metabolic Panel: Recent Labs  Lab 08/18/20 0837 08/18/20 0852 08/19/20 0057  NA 140 141 139  K 4.3 4.2 3.8  CL 108 110 106  CO2 21*  --  27  GLUCOSE 135* 135* 110*  BUN 13 14 10   CREATININE 0.89 0.80 0.85  CALCIUM 9.6  --  9.1   Liver Function Tests: Recent Labs  Lab 08/18/20 0837  AST 23  ALT 13  ALKPHOS 41  BILITOT 1.2  PROT 6.5  ALBUMIN 3.8   No results for input(s): LIPASE, AMYLASE in the last 168 hours. No results for input(s): AMMONIA in the last 168 hours. Cardiac Enzymes: No results for input(s): CKTOTAL, CKMB, CKMBINDEX, TROPONINI in the last 168 hours. BNP (last 3 results) No results for input(s): BNP in the last 8760 hours.  ProBNP (last 3 results) No results for input(s): PROBNP in the last 8760 hours.  CBG: No results for input(s): GLUCAP in the last 168 hours. Recent Results (from the past 240 hour(s))  Resp Panel by RT-PCR (Flu A&B, Covid) Nasopharyngeal Swab     Status: None   Collection Time: 08/18/20  9:11 AM   Specimen: Nasopharyngeal Swab; Nasopharyngeal(NP) swabs in vial transport medium  Result Value Ref Range Status   SARS Coronavirus 2 by RT PCR NEGATIVE NEGATIVE Final    Comment: (NOTE) SARS-CoV-2 target nucleic acids are NOT DETECTED.  The SARS-CoV-2 RNA is generally detectable in upper  respiratory specimens during the acute phase of infection. The lowest concentration of SARS-CoV-2 viral copies this assay can detect is 138 copies/mL. A negative result does not preclude SARS-Cov-2 infection and should not be used as the sole basis for treatment or other patient management decisions. A negative result may occur with  improper specimen collection/handling, submission of specimen other than nasopharyngeal swab, presence of viral mutation(s) within the areas targeted by this assay, and inadequate number of viral copies(<138 copies/mL). A negative result must be combined with clinical observations, patient history, and epidemiological information. The expected result is Negative.  Fact Sheet for Patients:  EntrepreneurPulse.com.au  Fact Sheet for Healthcare Providers:  IncredibleEmployment.be  This test is no t yet approved or cleared by the [place] FDA and  has been authorized for detection and/or diagnosis of SARS-CoV-2 by FDA under an Emergency Use Authorization (EUA). This EUA will remain  in effect (meaning this test  can be used) for the duration of the COVID-19 declaration under Section 564(b)(1) of the Act, 21 U.S.C.section 360bbb-3(b)(1), unless the authorization is terminated  or revoked sooner.       Influenza A by PCR NEGATIVE NEGATIVE Final   Influenza B by PCR NEGATIVE NEGATIVE Final    Comment: (NOTE) The Xpert Xpress SARS-CoV-2/FLU/RSV plus assay is intended as an aid in the diagnosis of influenza from Nasopharyngeal swab specimens and should not be used as a sole basis for treatment. Nasal washings and aspirates are unacceptable for Xpert Xpress SARS-CoV-2/FLU/RSV testing.  Fact Sheet for Patients: EntrepreneurPulse.com.au  Fact Sheet for Healthcare Providers: IncredibleEmployment.be  This test is not yet approved or cleared by the [place] FDA and has been  authorized for detection and/or diagnosis of SARS-CoV-2 by FDA under an Emergency Use Authorization (EUA). This EUA will remain in effect (meaning this test can be used) for the duration of the COVID-19 declaration under Section 564(b)(1) of the Act, 21 U.S.C. section 360bbb-3(b)(1), unless the authorization is terminated or revoked.  Performed at [facility], [place] [address], [place], [place] [address]      Studies: No results found.    Flora Lipps, MD  [facility] 08/21/2020  If 7PM-7AM, please contact night-coverage

## 2020-08-22 ENCOUNTER — Inpatient Hospital Stay (HOSPITAL_COMMUNITY): Payer: Medicare PPO

## 2020-08-22 ENCOUNTER — Telehealth: Payer: Self-pay | Admitting: Internal Medicine

## 2020-08-22 DIAGNOSIS — S0231XA Fracture of orbital floor, right side, initial encounter for closed fracture: Secondary | ICD-10-CM | POA: Diagnosis present

## 2020-08-22 DIAGNOSIS — Y92009 Unspecified place in unspecified non-institutional (private) residence as the place of occurrence of the external cause: Secondary | ICD-10-CM | POA: Diagnosis not present

## 2020-08-22 DIAGNOSIS — M2211 Recurrent subluxation of patella, right knee: Secondary | ICD-10-CM | POA: Diagnosis present

## 2020-08-22 DIAGNOSIS — Z79899 Other long term (current) drug therapy: Secondary | ICD-10-CM | POA: Diagnosis not present

## 2020-08-22 DIAGNOSIS — W01190A Fall on same level from slipping, tripping and stumbling with subsequent striking against furniture, initial encounter: Secondary | ICD-10-CM | POA: Diagnosis present

## 2020-08-22 DIAGNOSIS — Z83438 Family history of other disorder of lipoprotein metabolism and other lipidemia: Secondary | ICD-10-CM | POA: Diagnosis not present

## 2020-08-22 DIAGNOSIS — S0285XA Fracture of orbit, unspecified, initial encounter for closed fracture: Secondary | ICD-10-CM | POA: Diagnosis not present

## 2020-08-22 DIAGNOSIS — Y92018 Other place in single-family (private) house as the place of occurrence of the external cause: Secondary | ICD-10-CM | POA: Diagnosis not present

## 2020-08-22 DIAGNOSIS — M2212 Recurrent subluxation of patella, left knee: Secondary | ICD-10-CM | POA: Diagnosis present

## 2020-08-22 DIAGNOSIS — S066X0A Traumatic subarachnoid hemorrhage without loss of consciousness, initial encounter: Secondary | ICD-10-CM | POA: Diagnosis present

## 2020-08-22 DIAGNOSIS — Z96643 Presence of artificial hip joint, bilateral: Secondary | ICD-10-CM | POA: Diagnosis present

## 2020-08-22 DIAGNOSIS — M353 Polymyalgia rheumatica: Secondary | ICD-10-CM | POA: Diagnosis present

## 2020-08-22 DIAGNOSIS — M810 Age-related osteoporosis without current pathological fracture: Secondary | ICD-10-CM | POA: Diagnosis present

## 2020-08-22 DIAGNOSIS — I723 Aneurysm of iliac artery: Secondary | ICD-10-CM | POA: Diagnosis present

## 2020-08-22 DIAGNOSIS — S0990XA Unspecified injury of head, initial encounter: Secondary | ICD-10-CM | POA: Diagnosis present

## 2020-08-22 DIAGNOSIS — Z833 Family history of diabetes mellitus: Secondary | ICD-10-CM | POA: Diagnosis not present

## 2020-08-22 DIAGNOSIS — K5900 Constipation, unspecified: Secondary | ICD-10-CM | POA: Diagnosis present

## 2020-08-22 DIAGNOSIS — Z66 Do not resuscitate: Secondary | ICD-10-CM | POA: Diagnosis present

## 2020-08-22 DIAGNOSIS — Z8249 Family history of ischemic heart disease and other diseases of the circulatory system: Secondary | ICD-10-CM | POA: Diagnosis not present

## 2020-08-22 DIAGNOSIS — M159 Polyosteoarthritis, unspecified: Secondary | ICD-10-CM | POA: Diagnosis present

## 2020-08-22 DIAGNOSIS — K219 Gastro-esophageal reflux disease without esophagitis: Secondary | ICD-10-CM | POA: Diagnosis present

## 2020-08-22 DIAGNOSIS — Z20822 Contact with and (suspected) exposure to covid-19: Secondary | ICD-10-CM | POA: Diagnosis present

## 2020-08-22 DIAGNOSIS — Z981 Arthrodesis status: Secondary | ICD-10-CM | POA: Diagnosis not present

## 2020-08-22 DIAGNOSIS — E785 Hyperlipidemia, unspecified: Secondary | ICD-10-CM | POA: Diagnosis present

## 2020-08-22 DIAGNOSIS — Z7982 Long term (current) use of aspirin: Secondary | ICD-10-CM | POA: Diagnosis not present

## 2020-08-22 DIAGNOSIS — R001 Bradycardia, unspecified: Secondary | ICD-10-CM | POA: Diagnosis present

## 2020-08-22 DIAGNOSIS — S0093XA Contusion of unspecified part of head, initial encounter: Secondary | ICD-10-CM | POA: Diagnosis present

## 2020-08-22 DIAGNOSIS — I10 Essential (primary) hypertension: Secondary | ICD-10-CM | POA: Diagnosis present

## 2020-08-22 DIAGNOSIS — S52501A Unspecified fracture of the lower end of right radius, initial encounter for closed fracture: Secondary | ICD-10-CM | POA: Diagnosis present

## 2020-08-22 LAB — COMPREHENSIVE METABOLIC PANEL
ALT: 11 U/L (ref 0–44)
AST: 16 U/L (ref 15–41)
Albumin: 2.3 g/dL — ABNORMAL LOW (ref 3.5–5.0)
Alkaline Phosphatase: 41 U/L (ref 38–126)
Anion gap: 7 (ref 5–15)
BUN: 9 mg/dL (ref 8–23)
CO2: 25 mmol/L (ref 22–32)
Calcium: 8.7 mg/dL — ABNORMAL LOW (ref 8.9–10.3)
Chloride: 105 mmol/L (ref 98–111)
Creatinine, Ser: 0.74 mg/dL (ref 0.44–1.00)
GFR, Estimated: >60 mL/min (ref >60)
Glucose, Bld: 102 mg/dL — ABNORMAL HIGH (ref 70–99)
Potassium: 3.6 mmol/L (ref 3.5–5.1)
Sodium: 137 mmol/L (ref 135–145)
Total Bilirubin: 1.3 mg/dL — ABNORMAL HIGH (ref 0.3–1.2)
Total Protein: 5.4 g/dL — ABNORMAL LOW (ref 6.5–8.1)

## 2020-08-22 LAB — CBC
HCT: 34.9 % — ABNORMAL LOW (ref 36.0–46.0)
Hemoglobin: 11.3 g/dL — ABNORMAL LOW (ref 12.0–15.0)
MCH: 30.5 pg (ref 26.0–34.0)
MCHC: 32.4 g/dL (ref 30.0–36.0)
MCV: 94.3 fL (ref 80.0–100.0)
Platelets: 148 10*3/uL — ABNORMAL LOW (ref 150–400)
RBC: 3.7 MIL/uL — ABNORMAL LOW (ref 3.87–5.11)
RDW: 13.2 % (ref 11.5–15.5)
WBC: 7.4 10*3/uL (ref 4.0–10.5)
nRBC: 0 % (ref 0.0–0.2)

## 2020-08-22 LAB — PHOSPHORUS: Phosphorus: 2 mg/dL — ABNORMAL LOW (ref 2.5–4.6)

## 2020-08-22 LAB — MAGNESIUM: Magnesium: 2 mg/dL (ref 1.7–2.4)

## 2020-08-22 MED ORDER — BISACODYL 10 MG RE SUPP
10.0000 mg | Freq: Once | RECTAL | Status: AC
  Administered 2020-08-22: 10 mg via RECTAL
  Filled 2020-08-22: qty 1

## 2020-08-22 MED ORDER — POTASSIUM & SODIUM PHOSPHATES 280-160-250 MG PO PACK
1.0000 | PACK | Freq: Three times a day (TID) | ORAL | Status: DC
  Administered 2020-08-22 – 2020-08-27 (×18): 1 via ORAL
  Filled 2020-08-22 (×23): qty 1

## 2020-08-22 NOTE — Progress Notes (Signed)
Physical Therapy Treatment Patient Details Name: Sydney [name] MRN: [id number] DOB: 09-19-1932 Today's Date: 08/22/2020    History of Present Illness 87 y.o. female admitted 08/18/20 after mechanical fall resulting in R orbital fx and R sided exophthalmos, minimal punctate traumatic intercranial hemorrhage and subarachnoid hemorrhage with no mass effect, R wrist fx noted on xray ( 08/18/20) with wrist cock-up splint in place (NWB through hand, ok to WB through elbow).  HYW:VPXT, HTN, hyperlipidemia, pelvic fx (2022), B THR, Spinal Stenosis, lumbar fusion x 2, polymyalgia rheumatica.    PT Comments    Pt reported she had just returned from receiving x-rays on R knee. Knees buckling and very painful during OT treatment session. Deferred OOB mobility until results from x ray have been received. Limited to HEP in bed. Provided pt and daughter with handout of HEP. Pt very motivated and wanting to do as much as she safely can. Continues to be a good candidate for CIR. Will continue to follow acutely.    Follow Up Recommendations  CIR     Equipment Recommendations  Rolling walker with 5" wheels;Other (comment);Hospital bed;3in1 (PT);Wheelchair (measurements PT);Wheelchair cushion (measurements PT) (R platform attachment)    Recommendations for Other Services  (Ortho consult.)     Precautions / Restrictions Precautions Precautions: Fall;Other (comment) (Wrist Fx) Precaution Comments: NWB in RUE, can WB through elbow as tolerated. Required Braces or Orthoses: Splint/Cast Splint/Cast: R cockup wrist splint. Restrictions Weight Bearing Restrictions: Yes RUE Weight Bearing: Non weight bearing Other Position/Activity Restrictions: ok to WB through elbow on platform    Mobility  Bed Mobility Overal bed mobility: Needs Assistance Bed Mobility: Supine to Sit;Sit to Supine     Supine to sit: HOB elevated;Min assist Sit to supine: Mod assist;HOB elevated   General bed mobility comments: Limited  sesson to supine HEP until x-ray results come back    Transfers Overall transfer level: Needs assistance Equipment used: Right platform walker Transfers: Sit to/from Stand Sit to Stand: Min assist;Min guard;From elevated surface         General transfer comment: Pt stood 3 times from elevated bed surface. She required min assist the first 2 times to power up to standing,  the last sit<>stand pt was able to complete with no physical assistance, min guard for safety  Ambulation/Gait                 Stairs             Wheelchair Mobility    Modified Rankin (Stroke Patients Only)       Balance Overall balance assessment: Needs assistance Sitting-balance support: Feet supported Sitting balance-Leahy Scale: Good     Standing balance support: Bilateral upper extremity supported Standing balance-Leahy Scale: Fair Standing balance comment: Able to stand on her own, not stable with dynamic standing.                            Cognition Arousal/Alertness: Awake/alert Behavior During Therapy: WFL for tasks assessed/performed Overall Cognitive Status: Within Functional Limits for tasks assessed                                        Exercises General Exercises - Lower Extremity Ankle Circles/Pumps: AROM;Both;20 reps;Supine Quad Sets: AROM;Both;10 reps;Supine Short Arc Quad: AROM;Both;10 reps;Supine Heel Slides: AAROM;Both;10 reps;Supine (using gait belt) Hip ABduction/ADduction: AAROM;Right;10 reps;Supine (using gait  belt)    General Comments General comments (skin integrity, edema, etc.): Pt cleaned up and barrier cream applied to bottom. Pt does not want to sit in recliner at this time due to pain it causes her back.      Pertinent Vitals/Pain Pain Assessment: 0-10 Pain Score: 7  Faces Pain Scale: Hurts whole lot Pain Location: R groin. BIL knees, R shoulder and R wrist. Pain Descriptors / Indicators:  Grimacing;Guarding;Restless;Throbbing Pain Intervention(s): Monitored during session;Limited activity within patient's tolerance    Home Living                      Prior Function            PT Goals (current goals can now be found in the care plan section) Acute Rehab PT Goals Patient Stated Goal: to have less pain PT Goal Formulation: With patient Time For Goal Achievement: 09/02/20 Potential to Achieve Goals: Good Progress towards PT goals: Progressing toward goals    Frequency    Min 5X/week      PT Plan Current plan remains appropriate    Co-evaluation              AM-PAC PT "6 Clicks" Mobility   Outcome Measure  Help needed turning from your back to your side while in a flat bed without using bedrails?: A Lot Help needed moving from lying on your back to sitting on the side of a flat bed without using bedrails?: A Lot Help needed moving to and from a bed to a chair (including a wheelchair)?: A Lot Help needed standing up from a chair using your arms (e.g., wheelchair or bedside chair)?: A Lot Help needed to walk in hospital room?: A Lot Help needed climbing 3-5 steps with a railing? : Total 6 Click Score: 11    End of Session   Activity Tolerance: Patient limited by pain;Other (comment) (waiting on x-ray results for knee) Patient left: in bed;with call bell/phone within reach;with family/visitor present Nurse Communication: Mobility status PT Visit Diagnosis: Unsteadiness on feet (R26.81);Other abnormalities of gait and mobility (R26.89);Pain Pain - Right/Left: Right Pain - part of body: Arm     Time: 3664-4034 PT Time Calculation (min) (ACUTE ONLY): 22 min  Charges:  $Therapeutic Exercise: 8-22 mins                    Benjiman Core, [place] Pager [phone] Acute Rehab  Allena Katz 08/22/2020, 2:28 PM

## 2020-08-22 NOTE — Telephone Encounter (Signed)
MR. Diggins CALLED IN MRS. [place], BUT HE STATED THAT THE DOCTORS TOLD HIM THAT THEY WOULD REEVALUATE HER AND THEN DISCUSS SURGERY, BUT THEY DID NOT REEVALUATE THEY WENT STRAIGHT INTO DISCUSSING SURGERY. AND WANTED DR. Silvio Pate INPUT ON WHAT TO DO.

## 2020-08-22 NOTE — Progress Notes (Signed)
Occupational Therapy Treatment Patient Details Name: Sydney [name] MRN: [id number] DOB: 1932/09/29 Today's Date: 08/22/2020    History of present illness 87 y.o. female admitted 08/18/20 after mechanical fall resulting in R orbital fx and R sided exophthalmos, minimal punctate traumatic intercranial hemorrhage and subarachnoid hemorrhage with no mass effect, R wrist fx noted on xray ( 08/18/20) with wrist cock-up splint in place (NWB through hand, ok to WB through elbow).  TFT:DDUK, HTN, hyperlipidemia, pelvic fx (2022), B THR, Spinal Stenosis, lumbar fusion x 2, polymyalgia rheumatica.   OT comments  Pt progressing with OT goals this session. Pt was able to stand X3 from EOB with min assist for 2 stands and min guard for 1. When standing pt had sharp knee pain and buckling with steps, RN and MD made aware. Pt completed bathing, grooming, and transfers. Pt very motivated to move and get stronger. OT educated pt and daughter on donning and doffing the splint and sling, when to wear them, and to do skin inspections with splint to make sure that it is not rubbing her skin and causing break down. OT will continue to follow up and assist with progressing functional mobility and ADL performance.     Follow Up Recommendations  CIR    Equipment Recommendations  None recommended by OT    Recommendations for Other Services      Precautions / Restrictions Precautions Precautions: Fall;Other (comment) (Wrist Fx) Precaution Comments: NWB in RUE, can WB through elbow as tolerated. Required Braces or Orthoses: Splint/Cast Splint/Cast: R cockup wrist splint. Restrictions Weight Bearing Restrictions: Yes RUE Weight Bearing: Non weight bearing Other Position/Activity Restrictions: ok to WB through elbow on platform       Mobility Bed Mobility Overal bed mobility: Needs Assistance Bed Mobility: Supine to Sit;Sit to Supine     Supine to sit: HOB elevated;Min assist Sit to supine: Mod assist;HOB  elevated   General bed mobility comments: Min assist to come to sitting, assist with trunk elevation. Mod assist with lifting legs and pivotting trunk when going to supine.    Transfers Overall transfer level: Needs assistance Equipment used: Right platform walker Transfers: Sit to/from Stand Sit to Stand: Min assist;Min guard;From elevated surface         General transfer comment: Pt stood 3 times from elevated bed surface. She required min assist the first 2 times to power up to standing,  the last sit<>stand pt was able to complete with no physical assistance, min guard for safety    Balance Overall balance assessment: Needs assistance Sitting-balance support: Feet supported Sitting balance-Leahy Scale: Good     Standing balance support: Bilateral upper extremity supported Standing balance-Leahy Scale: Fair Standing balance comment: Able to stand on her own, not stable with dynamic standing.                           ADL either performed or assessed with clinical judgement   ADL Overall ADL's : Needs assistance/impaired Eating/Feeding: Set up;Sitting Eating/Feeding Details (indicate cue type and reason): Pt able to bring cup to her mouth and drink Grooming: Wash/dry hands;Wash/dry face;Brushing hair;Sitting Grooming Details (indicate cue type and reason): completed sitting EOB     Lower Body Bathing: Moderate assistance;Sitting/lateral leans;Sit to/from stand Lower Body Bathing Details (indicate cue type and reason): completed standing EOB, pt able to complete pericare, OT assisted with legs and bottom.         Toilet Transfer: Occupational hygienist  Details (indicate cue type and reason): simulated with side steps and forward steps from bed. Complete x3 Toileting- Clothing Manipulation and Hygiene: Min guard;Sitting/lateral lean Toileting - Clothing Manipulation Details (indicate cue type and reason): Pt able to complete pericare      Functional mobility during ADLs: Min guard;Rolling walker General ADL Comments: Pt taking small steps for transfers this session, stood x3 with R platform RW. Pt able to stand and complete pericare with min guard, needed mod assist for legs and feet. Grooming completed with set up EOB     Vision       Perception     Praxis      Cognition Arousal/Alertness: Awake/alert Behavior During Therapy: WFL for tasks assessed/performed Overall Cognitive Status: Within Functional Limits for tasks assessed                                          Exercises     Shoulder Instructions       General Comments Pt cleaned up and barrier cream applied to bottom. Pt does not want to sit in recliner at this time due to pain it causes her back.    Pertinent Vitals/ Pain       Pain Assessment: 0-10 Pain Score: 7  Pain Location: R groin. R knee, R shoulder and R wrist. Pain Descriptors / Indicators: Grimacing;Guarding;Restless;Throbbing Pain Intervention(s): Limited activity within patient's tolerance;Monitored during session;Repositioned  Home Living                                          Prior Functioning/Environment              Frequency  Min 2X/week        Progress Toward Goals  OT Goals(current goals can now be found in the care plan section)  Progress towards OT goals: Progressing toward goals  Acute Rehab OT Goals Patient Stated Goal: to have less pain OT Goal Formulation: With patient/family Time For Goal Achievement: 09/02/20 Potential to Achieve Goals: Good ADL Goals Pt Will Perform Grooming: with min guard assist;standing Pt Will Perform Lower Body Bathing: with min guard assist;sit to/from stand Pt Will Perform Lower Body Dressing: with min guard assist;sit to/from stand Pt Will Transfer to Toilet: with min guard assist;ambulating;regular height toilet;bedside commode;grab bars Pt Will Perform Toileting - Clothing Manipulation  and hygiene: with min guard assist;sit to/from stand  Plan Discharge plan remains appropriate;Frequency remains appropriate    Co-evaluation                 AM-PAC OT "6 Clicks" Daily Activity     Outcome Measure   Help from another person eating meals?: A Little Help from another person taking care of personal grooming?: A Little Help from another person toileting, which includes using toliet, bedpan, or urinal?: A Lot Help from another person bathing (including washing, rinsing, drying)?: A Lot Help from another person to put on and taking off regular upper body clothing?: A Little Help from another person to put on and taking off regular lower body clothing?: A Lot 6 Click Score: 15    End of Session Equipment Utilized During Treatment: Gait belt;Rolling walker  OT Visit Diagnosis: Unsteadiness on feet (R26.81);Pain;Muscle weakness (generalized) (M62.81) Pain - Right/Left: Right Pain - part of body: Arm;Hand;Knee  Activity Tolerance Patient limited by pain   Patient Left in bed;with call bell/phone within reach;with family/visitor present   Nurse Communication Mobility status;Other (comment) (Pain in R knee)        Time: 1111-1150 OT Time Calculation (min): 39 min  Charges: OT General Charges $OT Visit: 1 Visit OT Treatments $Self Care/Home Management : 23-37 mins $Therapeutic Activity: 8-22 mins  Avelardo Reesman H., OTR/L Acute Rehabilitation  Demarea Lorey Elane Catrell Morrone 08/22/2020, 12:09 PM

## 2020-08-22 NOTE — Progress Notes (Signed)
PROGRESS NOTE    Sydney [name]  PPI:[id number] DOB: 1932-11-21 DOA: 08/18/2020 PCP: Venia Carbon, MD    Brief Narrative:  87 year old female with history of GERD, hypertension, hyperlipidemia brought to the hospital after sustaining fall at home.  She fell and hit her head.  No prodromal symptoms.  She was found to have multiple injuries including right orbital blowout fracture, small intracranial hemorrhage and right wrist fracture.  Seen by neurosurgery, trauma surgery and ophthalmology during hospitalization.   Assessment & Plan:   Active Problems:   Fall   Right orbital fracture, closed, initial encounter ([place])   Head injury  Right orbital blowout fracture/retrobulbar hematoma: Seen by orthopedics Dr. Gillian Scarce in the emergency room.  Recommended supportive care.  Follow-up with [facility] ophthalmology associates after discharge.  Clinically improving. Patient seen by plastic surgery, Dr. Claudia Desanctis who is going to follow-up the patient.   Right wrist fracture: Seen by orthopedics.  Recommended nonweightbearing on the wrist, short arm splint, ice compression and mobility.  Minimal punctate traumatic intracranial hemorrhage and subarachnoid hemorrhage with no complication: Seen by neurosurgery.  Conservative management.  Fall/deconditioning: Work with PT OT.  Transfer to rehab when bed available.  Essential hypertension: Normotensive.  Was on nadolol that was stopped due to bradycardia.  Will discontinue.     DVT prophylaxis: SCDs Start: 08/18/20 1429   Code Status: DNR Family Communication: Daughter at the bedside Disposition Plan: Status is: Inpatient  Remains inpatient appropriate because:Unsafe d/c plan   Dispo: The patient is from: Home              Anticipated d/c is to: CIR              Patient currently is medically stable to d/c.   Difficult to place patient No         Consultants:   Neurosurgery  Ophthalmology  Plastic/trauma  surgery  Procedures:    None  Antimicrobials:   None   Subjective: Patient seen and examined.  Feels slightly better.  Right hip pain right knee is not helping much. Patient was very happy to have good vision for the right eye.  Daughter at the bedside. Mother was happy that they may not need any surgery. Waiting to go to rehab.  Objective: Vitals:   08/21/20 0505 08/21/20 1450 08/21/20 2033 08/22/20 0524  BP: (!) 115/56 (!) 109/56 114/60 128/67  Pulse: 81 95 81 72  Resp: 18 19 18 18   Temp: 98.4 F (36.9 C) 100.1 F (37.8 C) 98.7 F (37.1 C) 98.4 F (36.9 C)  TempSrc: Oral Oral Oral Oral  SpO2: 94% 94% 93% 95%    Intake/Output Summary (Last 24 hours) at 08/22/2020 1102 Last data filed at 08/22/2020 0737 Gross per 24 hour  Intake 720 ml  Output 2750 ml  Net -2030 ml   There were no vitals filed for this visit.  Examination:  General exam: Appears calm and comfortable  Patient has ecchymosis around right orbit, minimal swelling.  Normal vision. Subconjunctival hemorrhage present. Respiratory system: Clear to auscultation. Respiratory effort normal. Cardiovascular system: S1 & S2 heard, RRR. No JVD, murmurs, rubs, gallops or clicks. No pedal edema. Gastrointestinal system: Abdomen is nondistended, soft and nontender. No organomegaly or masses felt. Normal bowel sounds heard. Central nervous system: Alert oriented x4.  No focal deficits. Right wrist on his splint.     Data Reviewed: I have personally reviewed following labs and imaging studies  CBC: Recent Labs  Lab 08/18/20 0837 08/18/20  3810 08/19/20 0057 08/22/20 0134  WBC 9.2  --  6.8 7.4  NEUTROABS 7.3  --   --   --   HGB 15.2* 15.0 12.3 11.3*  HCT 46.4* 44.0 38.0 34.9*  MCV 93.5  --  95.0 94.3  PLT 198  --  174 175*   Basic Metabolic Panel: Recent Labs  Lab 08/18/20 0837 08/18/20 0852 08/19/20 0057 08/22/20 0134  NA 140 141 139 137  K 4.3 4.2 3.8 3.6  CL 108 110 106 105  CO2 21*  --  27 25  GLUCOSE 135* 135*  110* 102*  BUN 13 14 10 9   CREATININE 0.89 0.80 0.85 0.74  CALCIUM 9.6  --  9.1 8.7*  MG  --   --   --  2.0  PHOS  --   --   --  2.0*   GFR: CrCl cannot be calculated (Unknown ideal weight.). Liver Function Tests: Recent Labs  Lab 08/18/20 0837 08/22/20 0134  AST 23 16  ALT 13 11  ALKPHOS 41 41  BILITOT 1.2 1.3*  PROT 6.5 5.4*  ALBUMIN 3.8 2.3*   No results for input(s): LIPASE, AMYLASE in the last 168 hours. No results for input(s): AMMONIA in the last 168 hours. Coagulation Profile: Recent Labs  Lab 08/18/20 0837  INR 1.1   Cardiac Enzymes: No results for input(s): CKTOTAL, CKMB, CKMBINDEX, TROPONINI in the last 168 hours. BNP (last 3 results) No results for input(s): PROBNP in the last 8760 hours. HbA1C: No results for input(s): HGBA1C in the last 72 hours. CBG: No results for input(s): GLUCAP in the last 168 hours. Lipid Profile: No results for input(s): CHOL, HDL, LDLCALC, TRIG, CHOLHDL, LDLDIRECT in the last 72 hours. Thyroid Function Tests: No results for input(s): TSH, T4TOTAL, FREET4, T3FREE, THYROIDAB in the last 72 hours. Anemia Panel: No results for input(s): VITAMINB12, FOLATE, FERRITIN, TIBC, IRON, RETICCTPCT in the last 72 hours. Sepsis Labs: No results for input(s): PROCALCITON, LATICACIDVEN in the last 168 hours.  Recent Results (from the past 240 hour(s))  Resp Panel by RT-PCR (Flu A&B, Covid) Nasopharyngeal Swab     Status: None   Collection Time: 08/18/20  9:11 AM   Specimen: Nasopharyngeal Swab; Nasopharyngeal(NP) swabs in vial transport medium  Result Value Ref Range Status   SARS Coronavirus 2 by RT PCR NEGATIVE NEGATIVE Final    Comment: (NOTE) SARS-CoV-2 target nucleic acids are NOT DETECTED.  The SARS-CoV-2 RNA is generally detectable in upper respiratory specimens during the acute phase of infection. The lowest concentration of SARS-CoV-2 viral copies this assay can detect is 138 copies/mL. A negative result does not preclude  SARS-Cov-2 infection and should not be used as the sole basis for treatment or other patient management decisions. A negative result may occur with  improper specimen collection/handling, submission of specimen other than nasopharyngeal swab, presence of viral mutation(s) within the areas targeted by this assay, and inadequate number of viral copies(<138 copies/mL). A negative result must be combined with clinical observations, patient history, and epidemiological information. The expected result is Negative.  Fact Sheet for Patients:  EntrepreneurPulse.com.au  Fact Sheet for Healthcare Providers:  IncredibleEmployment.be  This test is no t yet approved or cleared by the [place] FDA and  has been authorized for detection and/or diagnosis of SARS-CoV-2 by FDA under an Emergency Use Authorization (EUA). This EUA will remain  in effect (meaning this test can be used) for the duration of the COVID-19 declaration under Section 564(b)(1) of the Act,  21 U.S.C.section 360bbb-3(b)(1), unless the authorization is terminated  or revoked sooner.       Influenza A by PCR NEGATIVE NEGATIVE Final   Influenza B by PCR NEGATIVE NEGATIVE Final    Comment: (NOTE) The Xpert Xpress SARS-CoV-2/FLU/RSV plus assay is intended as an aid in the diagnosis of influenza from Nasopharyngeal swab specimens and should not be used as a sole basis for treatment. Nasal washings and aspirates are unacceptable for Xpert Xpress SARS-CoV-2/FLU/RSV testing.  Fact Sheet for Patients: EntrepreneurPulse.com.au  Fact Sheet for Healthcare Providers: IncredibleEmployment.be  This test is not yet approved or cleared by the [place] FDA and has been authorized for detection and/or diagnosis of SARS-CoV-2 by FDA under an Emergency Use Authorization (EUA). This EUA will remain in effect (meaning this test can be used) for the duration of  the COVID-19 declaration under Section 564(b)(1) of the Act, 21 U.S.C. section 360bbb-3(b)(1), unless the authorization is terminated or revoked.  Performed at [facility], [place] [address] [place], [place] [address]          Radiology Studies: No results found.      Scheduled Meds: . diclofenac Sodium  2 g Topical QID  . hydrALAZINE  10 mg Intravenous Once  . pantoprazole  40 mg Oral Daily  . senna  2 tablet Oral Daily   Continuous Infusions:   LOS: 0 days    Time spent: 30 minutes    Barb Merino, MD [facility] Pager [phone]

## 2020-08-22 NOTE — Progress Notes (Signed)
Inpatient Rehabilitation Admissions Coordinator  Notified by Dr. Sloan Leiter that Dr Claudia Desanctis is planning surgery Friday. I will hold on pursuing Auth with [facility] for a possible CIR admit at this time.  Danne Baxter, RN, MSN Rehab Admissions Coordinator [phone] 08/22/2020 1:07 PM

## 2020-08-22 NOTE — Telephone Encounter (Signed)
Spoke to [employer]. They are concerned that they came in today and decided to go ahead with surgery around her eye. This is different from what they originally had told them of waiting to re-evaluate on Thursday or Friday before deciding to do surgery. He and pt are a little concerned and want Dr Alla German input. He is aware Dr Silvio Pate is not in the office and may or may not see this message before tomorrow afternoon.

## 2020-08-23 NOTE — Telephone Encounter (Signed)
I spoke to son today The plastic surgeon did come in again and explained things to her satisfaction. I also let him know that [facility] is out of network for [employer] she can't go there

## 2020-08-23 NOTE — PMR Pre-admission (Signed)
PMR Admission Coordinator Pre-Admission Assessment  Patient: Sydney [name] is an 87 y.o., female MRN: [id number] DOB: 02/27/1933 Height:   Weight:                Insurance Information HMO:     PPO: yes     PCP:      IPA:      80/20:      OTHER:  PRIMARY: [employer]      Policy#: [id number]      Subscriber: pt CM Name: Shirlean Mylar      Phone#: [phone] ext [phone]    Fax#: [phone] Pre-Cert#: [id number] approved for 7 days and f/u with Loraine ext [phone] Fax: same       Employer:  Benefits:  Phone #: [phone]     Name: 5/12 Eff. Date: 04/15/2019     Deduct: none      Out of Pocket Max: $4000      Life Max: none  CIR: $160 co pay per day days 1 until 10      SNF: no copay days 1 unitl 20; $188 co pay p er day days 21 until 41; no copay days 42 until 100 Outpatient: $20 per visit     Co-Pay: visits per medcial neccesity Home Health: 100%      Co-Pay: visits per medcial neccesity DME: 80%     Co-Pay: 20% Providers: in network  SECONDARY: none       Financial Counselor:       Phone#:   [employer]" for patients in Inpatient Rehabilitation Facilities with attached "Privacy Act [place] Records" was provided and verbally reviewed with: Patient and Family  Emergency Contact Information Contact Information    Name Relation Home Work Mobile   Shamiyah, Ngu Son   [phone]   Ricarda Frame Daughter   [phone]     Current Medical History  Patient Admitting Diagnosis: right wrist fx and right orbital fx as well as SAH s/p mechanical fall  History of Present Illness:  87 y.o. right-handed female with history of hypertension, hyperlipidemia, PMR, iliac artery aneurysm, bilateral hip replacements.  Presented 08/18/2020 after mechanical fall while walking to the bathroom when she slipped and struck her head on a dresser.  Denied loss of consciousness.  Denied any chest pain or shortness of breath associated with the fall.  Cranial CT scan  showed small amount of subarachnoid hemorrhage of the anterior left frontal lobe.  No other acute intracranial abnormality.  No skull fracture.  Patient sustained comminuted depressed right orbital floor fracture with fracture also involving the adjacent anterior inferior right medial orbital wall as well as right side exophthalmos.  CT cervical spine negative for fracture.  X-rays of right wrist showed subtle nondisplaced fracture of the distal radius metaphysis with an intra-articular component.  No dislocation.  Admission chemistries unremarkable except glucose 135.  Neurosurgery follow-up for small traumatic SAH/ICH Dr. Christella Noa no surgical intervention and monitor.   Conservative care of right nondisplaced fracture of distal radius nonweightbearing with cock up splint and no current surgical intervention.   Follow-up plastic surgery Dr. Claudia Desanctis in regards to right orbital floor fracture and underwent reconstruction of orbital floor 08/24/2020 per Dr. Claudia Desanctis.  Advised conservative care of right nondisplaced fracture of distal radius nonweightbearing with cock up splint and no current surgical intervention.    Past Medical History  Past Medical History:  Diagnosis Date  . GERD (gastroesophageal reflux disease)   . Hx of lumbosacral spine surgery  t  . Hyperlipidemia   . Hypertension   . Iliac artery aneurysm ([place])   . Lumbar spinal stenosis   . Polymyalgia rheumatica ([place]) 06/2016    Family History  family history includes Cancer in her brother; Diabetes in her sister; Heart attack in her father; Heart disease in her brother, brother, father, mother, and sister; Hyperlipidemia in her brother and father.  Prior Rehab/Hospitalizations:  Has the patient had prior rehab or hospitalizations prior to admission? Yes  Has the patient had major surgery during 100 days prior to admission? Yes  Current Medications   Current Facility-Administered Medications:  .  acetaminophen (TYLENOL) tablet 500 mg, 500  mg, Oral, Q6H PRN, Darliss Cheney, MD, 500 mg at 08/26/20 0305 .  bisacodyl (DULCOLAX) EC tablet 5 mg, 5 mg, Oral, Daily PRN, Caren Griffins, MD, 5 mg at 08/20/20 0255 .  cyclobenzaprine (FLEXERIL) tablet 5 mg, 5 mg, Oral, TID PRN, Caren Griffins, MD, 5 mg at 08/24/20 1656 .  diclofenac Sodium (VOLTAREN) 1 % topical gel 2 g, 2 g, Topical, QID, Pokhrel, Laxman, MD, 2 g at 08/27/20 0944 .  hydrALAZINE (APRESOLINE) injection 10 mg, 10 mg, Intravenous, Once, Pfeiffer, Marcy, MD .  hydrALAZINE (APRESOLINE) injection 10 mg, 10 mg, Intravenous, Q4H PRN, Pahwani, Ravi, MD .  morphine 2 MG/ML injection 2 mg, 2 mg, Intravenous, Q2H PRN, Charlesetta Shanks, MD, 2 mg at 08/20/20 0255 .  ondansetron (ZOFRAN) injection 4 mg, 4 mg, Intravenous, Q4H PRN, Charlesetta Shanks, MD, 4 mg at 08/18/20 0856 .  ondansetron (ZOFRAN) tablet 4 mg, 4 mg, Oral, Q6H PRN, 4 mg at 08/20/20 1539 **OR** ondansetron (ZOFRAN) injection 4 mg, 4 mg, Intravenous, Q6H PRN, Pahwani, Ravi, MD .  oxyCODONE (Oxy IR/ROXICODONE) immediate release tablet 5 mg, 5 mg, Oral, Q4H PRN, Darliss Cheney, MD, 5 mg at 08/26/20 1453 .  pantoprazole (PROTONIX) EC tablet 40 mg, 40 mg, Oral, Daily, Pahwani, Ravi, MD, 40 mg at 08/27/20 0939 .  phenol (CHLORASEPTIC) mouth spray 1 spray, 1 spray, Mouth/Throat, PRN, Barb Merino, MD, 1 spray at 08/25/20 1745 .  potassium & sodium phosphates (PHOS-NAK) 280-160-250 MG packet 1 packet, 1 packet, Oral, TID WC & HS, Barb Merino, MD, 1 packet at 08/27/20 [phone] .  senna (SENOKOT) tablet 17.2 mg, 2 tablet, Oral, Daily, Lovorn, Megan, MD, 17.2 mg at 08/27/20 6160  Patients Current Diet:  Diet Order            Diet - low sodium heart healthy           Diet regular Room service appropriate? Yes; Fluid consistency: Thin  Diet effective now                 Precautions / Restrictions Precautions Precautions: Fall,Other (comment) (R wrist fx) Precaution Comments: NWB in RUE, can WB through elbow as  tolerated. Restrictions Weight Bearing Restrictions: Yes RUE Weight Bearing: Weight bear through elbow only Other Position/Activity Restrictions: ok to WB through elbow on platform   Has the patient had 2 or more falls or a fall with injury in the past year?Yes  Prior Activity Level Limited Community (1-2x/wk): Mod I with rollator   Limited community due to COVID pandemic  Prior Functional Level Prior Function Level of Independence: Independent with assistive device(s) Comments: uses a rollator in the house and a cane in the community.  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  Since the onset of the pandemic, she stopped driving and going out in  the community.  She has a local son and grand daughter who provide transportation and get her groceries.  Self Care: Did the patient need help bathing, dressing, using the toilet or eating?  Independent  Indoor Mobility: Did the patient need assistance with walking from room to room (with or without device)? Independent  Stairs: Did the patient need assistance with internal or external stairs (with or without device)? Independent  Functional Cognition: Did the patient need help planning regular tasks such as shopping or remembering to take medications? Independent  Home Assistive Devices / Equipment Home Equipment: Cane - single point,Walker - 4 wheels,Shower seat  Prior Device Use: Indicate devices/aids used by the patient prior to current illness, exacerbation or injury? Walker  Current Functional Level Cognition  Overall Cognitive Status: Within Functional Limits for tasks assessed Orientation Level: Oriented X4 Following Commands: Follows one step commands with increased time Safety/Judgement: Decreased awareness of safety,Decreased awareness of deficits General Comments: Not specifically tested, but followed commands, conversation normal, processing speed seemed normal today    Extremity Assessment (includes  Sensation/Coordination)  Upper Extremity Assessment: Generalized weakness,RUE deficits/detail RUE Deficits / Details: Rt wrist splint in place.  Wrist not assessed.  Fingers and shoulder grossly WFL RUE: Unable to fully assess due to pain,Unable to fully assess due to immobilization RUE Coordination: decreased fine motor,decreased gross motor  Lower Extremity Assessment: Generalized weakness,RLE deficits/detail RLE Deficits / Details: R anterior hip and groin pain limiting hip AROM and activity tolerance RLE: Unable to fully assess due to pain    ADLs  Overall ADL's : Needs assistance/impaired Eating/Feeding: Set up,Sitting Eating/Feeding Details (indicate cue type and reason): Pt able to bring cup to her mouth and drink Grooming: Wash/dry hands,Wash/dry face,Brushing hair,Sitting Grooming Details (indicate cue type and reason): completed sitting EOB Upper Body Bathing: Minimal assistance,Sitting Lower Body Bathing: Moderate assistance,Sitting/lateral leans,Sit to/from stand Lower Body Bathing Details (indicate cue type and reason): completed standing EOB, pt able to complete pericare, OT assisted with legs and bottom. Upper Body Dressing : Minimal assistance,Sitting Lower Body Dressing: Moderate assistance,Sit to/from stand Lower Body Dressing Details (indicate cue type and reason): Pt able to don/doff Lt sock, but unable to don/doff Rt sock due to Rt groin pain Toilet Transfer: Min Public house manager Details (indicate cue type and reason): simulated with side steps and forward steps from bed. Complete x3 Toileting- Clothing Manipulation and Hygiene: Min guard,Sitting/lateral lean Toileting - Clothing Manipulation Details (indicate cue type and reason): Pt able to complete pericare Functional mobility during ADLs: Min guard,Rolling walker General ADL Comments: Pt taking small steps for transfers this session, stood x3 with R platform RW. Pt able to stand and complete  pericare with min guard, needed mod assist for legs and feet. Grooming completed with set up EOB    Mobility  Overal bed mobility: Needs Assistance Bed Mobility: Supine to Sit,Sit to Supine Rolling: Mod assist Supine to sit: Min guard Sit to supine: Min guard General bed mobility comments: min guard to move oob and back into bed.    Transfers  Overall transfer level: Needs assistance Equipment used: Right platform walker Transfers: Sit to/from Stand Sit to Stand: Min assist Stand pivot transfers: Mod assist,+2 physical assistance General transfer comment: Cues for hand placement and safety to maintain R elbow weight bearing.  Mild posterior lean in standing.    Ambulation / Gait / Stairs / Wheelchair Mobility  Ambulation/Gait Ambulation/Gait assistance: Mod assist Gait Distance (Feet): 35 Feet Assistive device: Right platform walker Gait Pattern/deviations:  Step-to pattern,Trunk flexed,Decreased stride length,Decreased stance time - right General Gait Details: Cues for upper trunk control, advancement of RW and increasing foot clearance on LLE.  noted to shuffle LLE forward due to R LE pain in stance phase.  Fatigues quickly with increase WOb but able to progress gt distance. Gait velocity: remains slow but speed improved. Stairs: Yes Stairs assistance: Mod assist Stair Management: One rail Left,Step to pattern,Forwards Number of Stairs: 2 General stair comments: Attempted to go up with one or both hands on L rail, however, pt with too much pain in R groin while stepping LLE up; tehn used L hand on rail and RUE over the shoulder assist from PT; able to go up 2 steps in this fashion; pt exhausted and requested to stop at 2 steps    Posture / Balance Balance Overall balance assessment: Needs assistance Sitting-balance support: Feet supported Sitting balance-Leahy Scale: Good Standing balance support: Bilateral upper extremity supported Standing balance-Leahy Scale: Fair Standing  balance comment: Able to stand on her own, not stable with dynamic standing.    Special needs/care consideration Fall precautions   Previous Home Environment  Living Arrangements: Alone (daughter, [name], here from [place] to assist)  Lives With: Alone (spouse died 2022-04-19) Available Help at Discharge: Family,Available 24 hours/day Type of Home: House Home Layout: One level Home Access: Stairs to enter Entrance Stairs-Rails: Left Entrance Stairs-Number of Steps: 3 Bathroom Shower/Tub: Walk-in shower,Other (comment) (walk in tub) Bathroom Toilet: Handicapped height Bathroom Accessibility: Yes How Accessible: Accessible via walker [place]: No Additional Comments: Pt's spouse died in 04-19-2022.  She has a son and grand daughter locally who assist as needed.  Discharge Living Setting Plans for Discharge Living Setting: Patient's home,Alone Type of Home at Discharge: House Discharge Home Layout: One level Discharge Home Access: Stairs to enter Entrance Stairs-Rails: Left Entrance Stairs-Number of Steps: 3 Discharge Bathroom Shower/Tub: Walk-in shower (walk in tub) Discharge Bathroom Toilet: Handicapped height Discharge Bathroom Accessibility: Yes How Accessible: Accessible via walker Does the patient have any problems obtaining your medications?: No  Social/Family/Support Systems Contact Information: daughter, Marland Mcalpine, here from [place] Anticipated Caregiver: daughter, [name] Anticipated Ambulance person Information: see above Ability/Limitations of Caregiver: none Caregiver Availability: 24/7 Discharge Plan Discussed with Primary Caregiver: Yes Is Caregiver In Agreement with Plan?: Yes Does Caregiver/Family have Issues with Lodging/Transportation while Pt is in Rehab?: No  Goals Patient/Family Goal for Rehab: supervision to min asisst with PT and OT Expected length of stay: ELOS 10 to 14 days Pt/Family Agrees to Admission and willing to participate:  Yes Program Orientation Provided & Reviewed with Pt/Caregiver Including Roles  & Responsibilities: Yes  Decrease burden of Care through IP rehab admission: n/a  Possible need for SNF placement upon discharge:not antiicapted  Patient Condition: This patient's medical and functional status has changed since the consult dated: 08/21/2020 in which the Rehabilitation Physician determined and documented that the patient's condition is appropriate for intensive rehabilitative care in an inpatient rehabilitation facility. See "History of Present Illness" (above) for medical update. Functional changes are: overall min to mod assist. Patient's medical and functional status update has been discussed with the Rehabilitation physician and patient remains appropriate for inpatient rehabilitation. Will admit to inpatient rehab today.  Preadmission Screen Completed By:  Cleatrice Burke, RN, 08/27/2020 10:32 AM ______________________________________________________________________   Discussed status with Dr. Ranell Patrick on 08/27/2020 at  1033 and received approval for admission today.  Admission Coordinator:  Cleatrice Burke, time 9518 Date 08/27/2020

## 2020-08-23 NOTE — Progress Notes (Signed)
Inpatient Rehabilitation Admissions Coordinator  I met with patient and her daughter at bedside. Noted plans for OR Friday. I will begin insurance Auth with [facility] for possible CIR admit. If not approved, they prefer [facility]. I have notified acute team and TOC, Cyrus and Levada Dy of their requests.  Danne Baxter, RN, MSN Rehab Admissions Coordinator [phone] 08/23/2020 12:14 PM

## 2020-08-23 NOTE — Progress Notes (Signed)
Physical Therapy Treatment Patient Details Name: NEEMA BARREIRA MRN: [id number] DOB: January 16, 1933 Today's Date: 08/23/2020    History of Present Illness 87 y.o. female admitted 08/18/20 after mechanical fall resulting in R orbital fx and R sided exophthalmos, minimal punctate traumatic intercranial hemorrhage and subarachnoid hemorrhage with no mass effect, R wrist fx noted on xray ( 08/18/20) with wrist cock-up splint in place (NWB through hand, ok to WB through elbow).  VQQ:VZDG, HTN, hyperlipidemia, pelvic fx (2022), B THR, Spinal Stenosis, lumbar fusion x 2, polymyalgia rheumatica.    PT Comments    Pt supine in bed, eager to mobilize.  Pt making great progress this session and able to ambulate x 25 ft with mod assistance using R platform RW.  Pt limited due to fatigue and required close chair follow.  Pt continues to benefit from aggressive rehab in a post acute setting to improve strength and function before returning home.     Follow Up Recommendations  CIR     Equipment Recommendations  Rolling walker with 5" wheels;Other (comment);Hospital bed;3in1 (PT);Wheelchair (measurements PT);Wheelchair cushion (measurements PT) (R platform attachment.)    Recommendations for Other Services       Precautions / Restrictions Precautions Precautions: Fall;Other (comment) (R wrist fx) Required Braces or Orthoses: Splint/Cast Splint/Cast: R cockup wrist splint. Restrictions Weight Bearing Restrictions: Yes RUE Weight Bearing: Weight bear through elbow only (NWB thru wrist) Other Position/Activity Restrictions: ok to WB through elbow on platform    Mobility  Bed Mobility Overal bed mobility: Needs Assistance Bed Mobility: Supine to Sit     Supine to sit: Min assist     General bed mobility comments: Min assistance to move LEs to edge of bed and elevate trunk into a seated position at edge of bed.    Transfers Overall transfer level: Needs assistance Equipment used: Right platform  walker Transfers: Sit to/from Stand Sit to Stand: Min assist;From elevated surface         General transfer comment: Cues for hand placement and safety to maintain R elbow weight bearing.  Ambulation/Gait Ambulation/Gait assistance: Mod assist Gait Distance (Feet): 25 Feet Assistive device: Right platform walker Gait Pattern/deviations: Step-to pattern;Trunk flexed;Decreased stride length;Decreased stance time - right Gait velocity: remains slow but speed improved.   General Gait Details: Cues for upper trunk control, advancement of RW and increasing foot clearance on LLE.  noted to shuffle LLE forward due to R LE pain in stance phase.   Stairs             Wheelchair Mobility    Modified Rankin (Stroke Patients Only)       Balance                                            Cognition Arousal/Alertness: Awake/alert Behavior During Therapy: WFL for tasks assessed/performed Overall Cognitive Status: Within Functional Limits for tasks assessed                                        Exercises      General Comments        Pertinent Vitals/Pain Pain Assessment: 0-10 Pain Score: 4  Pain Location: R LE and R wrist Pain Descriptors / Indicators: Guarding;Discomfort Pain Intervention(s): Monitored during session;Repositioned    Home Living  Prior Function            PT Goals (current goals can now be found in the care plan section) Acute Rehab PT Goals Patient Stated Goal: to have less pain Potential to Achieve Goals: Good Progress towards PT goals: Progressing toward goals    Frequency    Min 5X/week      PT Plan Current plan remains appropriate    Co-evaluation              AM-PAC PT "6 Clicks" Mobility   Outcome Measure  Help needed turning from your back to your side while in a flat bed without using bedrails?: A Lot Help needed moving from lying on your back to sitting  on the side of a flat bed without using bedrails?: A Lot Help needed moving to and from a bed to a chair (including a wheelchair)?: A Little Help needed standing up from a chair using your arms (e.g., wheelchair or bedside chair)?: A Little Help needed to walk in hospital room?: A Lot Help needed climbing 3-5 steps with a railing? : A Lot 6 Click Score: 14    End of Session Equipment Utilized During Treatment: Gait belt Activity Tolerance: Patient tolerated treatment well Patient left: in bed;with call bell/phone within reach;with family/visitor present Nurse Communication: Mobility status PT Visit Diagnosis: Unsteadiness on feet (R26.81);Other abnormalities of gait and mobility (R26.89);Pain Pain - Right/Left: Right Pain - part of body: Arm     Time: 3500-9381 PT Time Calculation (min) (ACUTE ONLY): 23 min  Charges:  $Gait Training: 8-22 mins $Therapeutic Activity: 8-22 mins                     Erasmo Leventhal , PTA Acute Rehabilitation Services Pager [phone] Office [place] 08/23/2020, 2:06 PM

## 2020-08-23 NOTE — Progress Notes (Signed)
Saw patient in her hospital room today.  She feels that overall she is doing well from the standpoint of her facial fractures.  She reports good vision and no double vision.  On exam she has quite a bit of scleral injection on the right side.  The bruising and swelling is improving.  If anything at this point she may be slightly enophthalmic on the right side compared to the left.  Extraocular movements intact and pupil reaction to light is within normal limits.  Had a discussion with the patient and her daughter about the surgical plan.  I do feel based on the nature of the fracture and the fact that her globe seems to be retracting posteriorly that an orbital floor repair would be of benefit.  We discussed the risks of the procedure that include bleeding, infection, damage to surrounding structures and need for additional procedures.  All her questions were answered and we will plan to proceed with surgery tomorrow.  Please keep n.p.o. after midnight.  Please hold all blood thinners. 

## 2020-08-23 NOTE — Progress Notes (Signed)
PROGRESS NOTE    Sydney [name]  DVV:[id number] DOB: [birth year] DOA: 08/18/2020 PCP: Venia Carbon, MD    Brief Narrative:  87 year old female with history of GERD, hypertension, hyperlipidemia brought to the hospital after sustaining fall at home.  She fell and hit her head.  No prodromal symptoms.  She was found to have multiple injuries including right orbital blowout fracture, small intracranial hemorrhage and right wrist fracture.  Seen by neurosurgery, trauma surgery and ophthalmology during hospitalization.   Assessment & Plan:   Active Problems:   Fall   Right orbital fracture, closed, initial encounter ([place])   Head injury  Right orbital blowout fracture/retrobulbar hematoma: Seen by Ophthalmology Dr. Gillian Scarce in the emergency room.  Recommended supportive care.  Follow-up with [facility] after discharge.  Clinically improving. Patient seen by plastic surgery, Dr. Claudia Desanctis who is going to follow-up the patient.  Patient is on tentatively scheduled for reconstruction surgery tomorrow 5/13. Dr. Claudia Desanctis will discuss with patient and family before surgery tonight or tomorrow morning.  Right wrist fracture: Seen by orthopedics.  Recommended nonweightbearing on the wrist, short arm splint, ice compression and mobility. Bilateral subluxation of the patella, chronic.  Work with PT OT.  Minimal punctate traumatic intracranial hemorrhage and subarachnoid hemorrhage with no complication: Seen by neurosurgery.  Conservative management.  Fall/deconditioning: Work with PT OT.  Transfer to rehab pending surgical decision.  Essential hypertension: Normotensive.  Was on nadolol that was stopped due to bradycardia.  Will discontinue.  Continue to mobilize with therapies.      DVT prophylaxis: SCDs Start: 08/18/20 1429   Code Status: DNR Family Communication: Daughter at the bedside Disposition Plan: Status is: Inpatient  Remains inpatient appropriate because:Unsafe  d/c plan   Dispo: The patient is from: Home              Anticipated d/c is to: CIR              Patient currently is not medically stable.  Tentative surgery planned for tomorrow.   Difficult to place patient No         Consultants:   Neurosurgery  Ophthalmology  Plastic/trauma  surgery  Procedures:   None  Antimicrobials:   None   Subjective: Patient seen and examined.  No overnight events.  Vision is good.  Daughter at the bedside. There were confused about need for surgery, I told them that the surgeon will come back to talk to them before she is taken to surgery if any needed tomorrow.  Objective: Vitals:   08/22/20 0524 08/22/20 1600 08/22/20 2008 08/23/20 0356  BP: 128/67 116/62 121/65 (!) 137/103  Pulse: 72 89 81 83  Resp: 18 20 17 18   Temp: 98.4 F (36.9 C) 98.7 F (37.1 C) 97.8 F (36.6 C) (!) 97.4 F (36.3 C)  TempSrc: Oral Oral Oral Oral  SpO2: 95% 98% 93% 100%    Intake/Output Summary (Last 24 hours) at 08/23/2020 1043 Last data filed at 08/23/2020 0801 Gross per 24 hour  Intake 560 ml  Output 1550 ml  Net -990 ml   There were no vitals filed for this visit.  Examination:  General exam: Appears calm and comfortable  Patient has ecchymosis around right orbit, minimal swelling.  Normal vision. Subconjunctival hemorrhage present. Respiratory system: Clear to auscultation. Respiratory effort normal. Cardiovascular system: S1 & S2 heard, RRR. No JVD, murmurs, rubs, gallops or clicks. No pedal edema. Gastrointestinal system: Abdomen is nondistended, soft and nontender. No organomegaly or masses  felt. Normal bowel sounds heard. Central nervous system: Alert oriented x4.  No focal deficits. Right wrist on splint.     Data Reviewed: I have personally reviewed following labs and imaging studies  CBC: Recent Labs  Lab 08/18/20 0837 08/18/20 0852 08/19/20 0057 08/22/20 0134  WBC 9.2  --  6.8 7.4  NEUTROABS 7.3  --   --   --   HGB  15.2* 15.0 12.3 11.3*  HCT 46.4* 44.0 38.0 34.9*  MCV 93.5  --  95.0 94.3  PLT 198  --  174 267*   Basic Metabolic Panel: Recent Labs  Lab 08/18/20 0837 08/18/20 0852 08/19/20 0057 08/22/20 0134  NA 140 141 139 137  K 4.3 4.2 3.8 3.6  CL 108 110 106 105  CO2 21*  --  27 25  GLUCOSE 135* 135* 110* 102*  BUN 13 14 10 9   CREATININE 0.89 0.80 0.85 0.74  CALCIUM 9.6  --  9.1 8.7*  MG  --   --   --  2.0  PHOS  --   --   --  2.0*   GFR: CrCl cannot be calculated (Unknown ideal weight.). Liver Function Tests: Recent Labs  Lab 08/18/20 0837 08/22/20 0134  AST 23 16  ALT 13 11  ALKPHOS 41 41  BILITOT 1.2 1.3*  PROT 6.5 5.4*  ALBUMIN 3.8 2.3*   No results for input(s): LIPASE, AMYLASE in the last 168 hours. No results for input(s): AMMONIA in the last 168 hours. Coagulation Profile: Recent Labs  Lab 08/18/20 0837  INR 1.1   Cardiac Enzymes: No results for input(s): CKTOTAL, CKMB, CKMBINDEX, TROPONINI in the last 168 hours. BNP (last 3 results) No results for input(s): PROBNP in the last 8760 hours. HbA1C: No results for input(s): HGBA1C in the last 72 hours. CBG: No results for input(s): GLUCAP in the last 168 hours. Lipid Profile: No results for input(s): CHOL, HDL, LDLCALC, TRIG, CHOLHDL, LDLDIRECT in the last 72 hours. Thyroid Function Tests: No results for input(s): TSH, T4TOTAL, FREET4, T3FREE, THYROIDAB in the last 72 hours. Anemia Panel: No results for input(s): VITAMINB12, FOLATE, FERRITIN, TIBC, IRON, RETICCTPCT in the last 72 hours. Sepsis Labs: No results for input(s): PROCALCITON, LATICACIDVEN in the last 168 hours.  Recent Results (from the past 240 hour(s))  Resp Panel by RT-PCR (Flu A&B, Covid) Nasopharyngeal Swab     Status: None   Collection Time: 08/18/20  9:11 AM   Specimen: Nasopharyngeal Swab; Nasopharyngeal(NP) swabs in vial transport medium  Result Value Ref Range Status   SARS Coronavirus 2 by RT PCR NEGATIVE NEGATIVE Final    Comment:  (NOTE) SARS-CoV-2 target nucleic acids are NOT DETECTED.  The SARS-CoV-2 RNA is generally detectable in upper respiratory specimens during the acute phase of infection. The lowest concentration of SARS-CoV-2 viral copies this assay can detect is 138 copies/mL. A negative result does not preclude SARS-Cov-2 infection and should not be used as the sole basis for treatment or other patient management decisions. A negative result may occur with  improper specimen collection/handling, submission of specimen other than nasopharyngeal swab, presence of viral mutation(s) within the areas targeted by this assay, and inadequate number of viral copies(<138 copies/mL). A negative result must be combined with clinical observations, patient history, and epidemiological information. The expected result is Negative.  Fact Sheet for Patients:  EntrepreneurPulse.com.au  Fact Sheet for Healthcare Providers:  IncredibleEmployment.be  This test is no t yet approved or cleared by the [place] and  has been authorized for detection and/or diagnosis of SARS-CoV-2 by FDA under an Emergency Use Authorization (EUA). This EUA will remain  in effect (meaning this test can be used) for the duration of the COVID-19 declaration under Section 564(b)(1) of the Act, 21 U.S.C.section 360bbb-3(b)(1), unless the authorization is terminated  or revoked sooner.       Influenza A by PCR NEGATIVE NEGATIVE Final   Influenza B by PCR NEGATIVE NEGATIVE Final    Comment: (NOTE) The Xpert Xpress SARS-CoV-2/FLU/RSV plus assay is intended as an aid in the diagnosis of influenza from Nasopharyngeal swab specimens and should not be used as a sole basis for treatment. Nasal washings and aspirates are unacceptable for Xpert Xpress SARS-CoV-2/FLU/RSV testing.  Fact Sheet for Patients: EntrepreneurPulse.com.au  Fact Sheet for Healthcare  Providers: IncredibleEmployment.be  This test is not yet approved or cleared by the [place] FDA and has been authorized for detection and/or diagnosis of SARS-CoV-2 by FDA under an Emergency Use Authorization (EUA). This EUA will remain in effect (meaning this test can be used) for the duration of the COVID-19 declaration under Section 564(b)(1) of the Act, 21 U.S.C. section 360bbb-3(b)(1), unless the authorization is terminated or revoked.  Performed at [facility], [place] [address], [place], [place] [address]          Radiology Studies: DG Knee 1-2 Views Right  Result Date: 08/22/2020 CLINICAL DATA:  Pain and swelling following recent injury EXAM: RIGHT KNEE - 1-2 VIEW COMPARISON:  None. FINDINGS: Frontal and lateral views were obtained. There is lateral patellar subluxation. No fracture or frank dislocation. Small joint effusion. There is severe joint space narrowing medially and laterally with moderate narrowing of the patellofemoral joint. There is spurring in all compartments. No erosive change. There are foci of chondrocalcinosis. IMPRESSION: Lateral patellar subluxation. No fracture or dislocation. Small joint effusion. Extensive osteoarthritic change with narrowing most severe medially and laterally. Spurring noted in all compartments. There is chondrocalcinosis which may be seen with osteoarthritis or with calcium pyrophosphate deposition disease. Electronically Signed   By: Lowella Grip III M.D.   On: 08/22/2020 14:55        Scheduled Meds: . diclofenac Sodium  2 g Topical QID  . hydrALAZINE  10 mg Intravenous Once  . pantoprazole  40 mg Oral Daily  . potassium & sodium phosphates  1 packet Oral TID WC & HS  . senna  2 tablet Oral Daily   Continuous Infusions:   LOS: 1 day    Time spent: 30 minutes    Barb Merino, MD [facility] Pager [phone]

## 2020-08-23 NOTE — H&P (View-Only) (Signed)
Saw patient in her hospital room today.  She feels that overall she is doing well from the standpoint of her facial fractures.  She reports good vision and no double vision.  On exam she has quite a bit of scleral injection on the right side.  The bruising and swelling is improving.  If anything at this point she may be slightly enophthalmic on the right side compared to the left.  Extraocular movements intact and pupil reaction to light is within normal limits.  Had a discussion with the patient and her daughter about the surgical plan.  I do feel based on the nature of the fracture and the fact that her globe seems to be retracting posteriorly that an orbital floor repair would be of benefit.  We discussed the risks of the procedure that include bleeding, infection, damage to surrounding structures and need for additional procedures.  All her questions were answered and we will plan to proceed with surgery tomorrow.  Please keep n.p.o. after midnight.  Please hold all blood thinners.

## 2020-08-24 ENCOUNTER — Inpatient Hospital Stay (HOSPITAL_COMMUNITY): Payer: Medicare PPO | Admitting: Certified Registered Nurse Anesthetist

## 2020-08-24 ENCOUNTER — Encounter (HOSPITAL_COMMUNITY)
Admission: EM | Disposition: A | Discharge status: Rehabilitation Facility | Payer: Self-pay | Source: Home / Self Care | Attending: Internal Medicine

## 2020-08-24 ENCOUNTER — Encounter (HOSPITAL_COMMUNITY): Payer: Self-pay | Admitting: Internal Medicine

## 2020-08-24 ENCOUNTER — Ambulatory Visit (HOSPITAL_COMMUNITY): Admission: RE | Admit: 2020-08-24 | Payer: Medicare PPO | Source: Home / Self Care | Admitting: Plastic Surgery

## 2020-08-24 DIAGNOSIS — S0231XA Fracture of orbital floor, right side, initial encounter for closed fracture: Secondary | ICD-10-CM

## 2020-08-24 HISTORY — PX: ORIF ORBITAL FRACTURE: SHX5312

## 2020-08-24 LAB — SURGICAL PCR SCREEN
MRSA, PCR: NEGATIVE
Staphylococcus aureus: NEGATIVE

## 2020-08-24 SURGERY — OPEN REDUCTION INTERNAL FIXATION (ORIF) ORBITAL FRACTURE
Anesthesia: General | Site: Eye | Laterality: Right

## 2020-08-24 MED ORDER — ROCURONIUM BROMIDE 10 MG/ML (PF) SYRINGE
PREFILLED_SYRINGE | INTRAVENOUS | Status: DC | PRN
  Administered 2020-08-24: 50 mg via INTRAVENOUS

## 2020-08-24 MED ORDER — BSS IO SOLN
INTRAOCULAR | Status: DC | PRN
  Administered 2020-08-24: 1

## 2020-08-24 MED ORDER — SUGAMMADEX SODIUM 200 MG/2ML IV SOLN
INTRAVENOUS | Status: DC | PRN
  Administered 2020-08-24: 200 mg via INTRAVENOUS

## 2020-08-24 MED ORDER — GLYCOPYRROLATE PF 0.2 MG/ML IJ SOSY
PREFILLED_SYRINGE | INTRAMUSCULAR | Status: DC | PRN
  Administered 2020-08-24: .2 mg via INTRAVENOUS

## 2020-08-24 MED ORDER — ONDANSETRON HCL 4 MG/2ML IJ SOLN
INTRAMUSCULAR | Status: DC | PRN
  Administered 2020-08-24: 4 mg via INTRAVENOUS

## 2020-08-24 MED ORDER — LIDOCAINE-EPINEPHRINE 1 %-1:100000 IJ SOLN
INTRAMUSCULAR | Status: DC | PRN
  Administered 2020-08-24: 7.5 mL

## 2020-08-24 MED ORDER — LACTATED RINGERS IV SOLN: INTRAVENOUS | Status: DC

## 2020-08-24 MED ORDER — PROPOFOL 10 MG/ML IV BOLUS
INTRAVENOUS | Status: DC | PRN
  Administered 2020-08-24: 30 mg via INTRAVENOUS
  Administered 2020-08-24: 150 mg via INTRAVENOUS

## 2020-08-24 MED ORDER — ESMOLOL HCL 100 MG/10ML IV SOLN
INTRAVENOUS | Status: DC | PRN
  Administered 2020-08-24: 30 mg via INTRAVENOUS

## 2020-08-24 MED ORDER — ORAL CARE MOUTH RINSE: 15.0000 mL | Freq: Once | OROMUCOSAL | Status: AC

## 2020-08-24 MED ORDER — LIDOCAINE 2% (20 MG/ML) 5 ML SYRINGE
INTRAMUSCULAR | Status: DC | PRN
  Administered 2020-08-24: 20 mg via INTRAVENOUS

## 2020-08-24 MED ORDER — FENTANYL CITRATE (PF) 100 MCG/2ML IJ SOLN
INTRAMUSCULAR | Status: DC | PRN
  Administered 2020-08-24 (×2): 50 ug via INTRAVENOUS

## 2020-08-24 MED ORDER — DEXAMETHASONE SODIUM PHOSPHATE 10 MG/ML IJ SOLN
INTRAMUSCULAR | Status: DC | PRN
  Administered 2020-08-24: 10 mg via INTRAVENOUS

## 2020-08-24 MED ORDER — 0.9 % SODIUM CHLORIDE (POUR BTL) OPTIME
TOPICAL | Status: DC | PRN
  Administered 2020-08-24: 1000 mL

## 2020-08-24 MED ORDER — WHITE PETROLATUM EX OINT
TOPICAL_OINTMENT | CUTANEOUS | Status: AC
  Administered 2020-08-24: 1
  Filled 2020-08-24: qty 28.35

## 2020-08-24 MED ORDER — CHLORHEXIDINE GLUCONATE 0.12 % MT SOLN
15.0000 mL | Freq: Once | OROMUCOSAL | Status: AC
  Administered 2020-08-24: 15 mL via OROMUCOSAL

## 2020-08-24 MED ORDER — FENTANYL CITRATE (PF) 100 MCG/2ML IJ SOLN: 25.0000 ug | INTRAMUSCULAR | Status: DC | PRN

## 2020-08-24 MED ORDER — CEFAZOLIN SODIUM-DEXTROSE 2-4 GM/100ML-% IV SOLN
2.0000 g | INTRAVENOUS | Status: AC
  Administered 2020-08-24: 2 g via INTRAVENOUS

## 2020-08-24 MED ORDER — BACITRACIN ZINC 500 UNIT/GM EX OINT
TOPICAL_OINTMENT | CUTANEOUS | Status: DC | PRN
  Administered 2020-08-24: 1 via TOPICAL

## 2020-08-24 SURGICAL SUPPLY — 34 items
BLADE CLIPPER SURG (BLADE) IMPLANT
BLADE SURG 15 STRL LF DISP TIS (BLADE) ×1 IMPLANT
BLADE SURG 15 STRL SS (BLADE) ×2
CANISTER SUCT 3000ML PPV (MISCELLANEOUS) ×2 IMPLANT
COVER SURGICAL LIGHT HANDLE (MISCELLANEOUS) ×2 IMPLANT
COVER WAND RF STERILE (DRAPES) ×2 IMPLANT
ELECT COATED BLADE 2.86 ST (ELECTRODE) ×2 IMPLANT
ELECT NDL BLADE 2-5/6 (NEEDLE) IMPLANT
ELECT NDL TIP 2.8 STRL (NEEDLE) ×1 IMPLANT
ELECT NEEDLE BLADE 2-5/6 (NEEDLE) ×2 IMPLANT
ELECT NEEDLE TIP 2.8 STRL (NEEDLE) ×2 IMPLANT
ELECT REM PT RETURN 9FT ADLT (ELECTROSURGICAL) ×2
ELECTRODE REM PT RTRN 9FT ADLT (ELECTROSURGICAL) ×1 IMPLANT
GLOVE BIO SURGEON STRL SZ 6.5 (GLOVE) ×2 IMPLANT
GOWN STRL REUS W/ TWL LRG LVL3 (GOWN DISPOSABLE) ×2 IMPLANT
GOWN STRL REUS W/TWL LRG LVL3 (GOWN DISPOSABLE) ×4
IMPL SURGICAL MEDPOR (Orthopedic Implant) IMPLANT
IMPLANT SURGICAL MEDPOR (Orthopedic Implant) ×2 IMPLANT
KIT BASIN OR (CUSTOM PROCEDURE TRAY) ×2 IMPLANT
KIT TURNOVER KIT B (KITS) ×2 IMPLANT
NEEDLE PRECISIONGLIDE 27X1.5 (NEEDLE) ×2 IMPLANT
NS IRRIG 1000ML POUR BTL (IV SOLUTION) ×2 IMPLANT
PAD ARMBOARD 7.5X6 YLW CONV (MISCELLANEOUS) ×4 IMPLANT
PENCIL BUTTON HOLSTER BLD 10FT (ELECTRODE) ×3 IMPLANT
PROTECTOR CORNEAL (OPHTHALMIC RELATED) ×1 IMPLANT
SCREW MIDFACE 1.7X4 SLF DRILL (Screw) ×2 IMPLANT
SUT PLAIN GUT FAST 5-0 (SUTURE) ×1 IMPLANT
SUT VIC AB 5-0 P-3 18X BRD (SUTURE) IMPLANT
SUT VIC AB 5-0 P3 18 (SUTURE) ×2
TOWEL GREEN STERILE (TOWEL DISPOSABLE) ×2 IMPLANT
TOWEL GREEN STERILE FF (TOWEL DISPOSABLE) ×2 IMPLANT
TRAY ENT MC OR (CUSTOM PROCEDURE TRAY) ×2 IMPLANT
TRAY FOLEY MTR SLVR 14FR STAT (SET/KITS/TRAYS/PACK) IMPLANT
WATER STERILE IRR 1000ML POUR (IV SOLUTION) ×2 IMPLANT

## 2020-08-24 NOTE — Anesthesia Preprocedure Evaluation (Signed)
Anesthesia Evaluation  Patient identified by MRN, date of birth, ID band Patient awake    Reviewed: Allergy & Precautions, NPO status , Patient's Chart, lab work & pertinent test results, reviewed documented beta blocker date and time   History of Anesthesia Complications Negative for: history of anesthetic complications  Airway Mallampati: II  TM Distance: >3 FB Neck ROM: Full    Dental  (+) Caps, Dental Advisory Given   Pulmonary  08/18/2020 SARS coronavirus NEG   breath sounds clear to auscultation       Cardiovascular hypertension, Pt. on medications and Pt. on home beta blockers (-) angina Rhythm:Regular Rate:Normal     Neuro/Psych Back pain    GI/Hepatic Neg liver ROS, GERD  Controlled,  Endo/Other  negative endocrine ROS  Renal/GU negative Renal ROS     Musculoskeletal  (+) Arthritis ,   Abdominal   Peds  Hematology negative hematology ROS (+)   Anesthesia Other Findings   Reproductive/Obstetrics                             Anesthesia Physical Anesthesia Plan  ASA: III  Anesthesia Plan: General   Post-op Pain Management:    Induction: Intravenous  PONV Risk Score and Plan: 3 and Ondansetron, Dexamethasone and Treatment may vary due to age or medical condition  Airway Management Planned: Oral ETT  Additional Equipment: None  Intra-op Plan:   Post-operative Plan: Extubation in OR  Informed Consent: I have reviewed the patients History and Physical, chart, labs and discussed the procedure including the risks, benefits and alternatives for the proposed anesthesia with the patient or authorized representative who has indicated his/her understanding and acceptance.   Patient has DNR.  Discussed DNR with patient and Suspend DNR.   Dental advisory given  Plan Discussed with: CRNA and Surgeon  Anesthesia Plan Comments:         Anesthesia Quick Evaluation

## 2020-08-24 NOTE — Interval H&P Note (Signed)
History and Physical Interval Note:  08/24/2020 1:42 PM  Sydney [name]  has presented today for surgery, with the diagnosis of right orbital fracture.  The various methods of treatment have been discussed with the patient and family. After consideration of risks, benefits and other options for treatment, the patient has consented to  Procedure(s) with comments: open reduction internal fixation of right orbital floor fracture (Right) - 60 min as a surgical intervention.  The patient's history has been reviewed, patient examined, no change in status, stable for surgery.  I have reviewed the patient's chart and labs.  Questions were answered to the patient's satisfaction.     Brayan Votaw S Leslea Vowles   

## 2020-08-24 NOTE — Anesthesia Procedure Notes (Signed)
Procedure Name: Intubation Date/Time: 08/24/2020 2:26 PM Performed by: Genelle Bal, CRNA Pre-anesthesia Checklist: Patient identified, Emergency Drugs available, Suction available and Patient being monitored Patient Re-evaluated:Patient Re-evaluated prior to induction Oxygen Delivery Method: Circle system utilized Preoxygenation: Pre-oxygenation with 100% oxygen Induction Type: IV induction Ventilation: Mask ventilation without difficulty Laryngoscope Size: Miller and 2 Grade View: Grade I Tube type: Oral Tube size: 7.0 mm Number of attempts: 1 Airway Equipment and Method: Stylet and Oral airway Placement Confirmation: ETT inserted through vocal cords under direct vision,  positive ETCO2 and breath sounds checked- equal and bilateral Secured at: 20 cm Tube secured with: Tape Dental Injury: Teeth and Oropharynx as per pre-operative assessment

## 2020-08-24 NOTE — Brief Op Note (Signed)
08/24/2020  3:29 PM  PATIENT:  Sydney [name]  87 y.o. female  PRE-OPERATIVE DIAGNOSIS:  right orbital fracture  POST-OPERATIVE DIAGNOSIS:  right orbital fracture  PROCEDURE:  Procedure(s): open reduction internal fixation of right orbital floor fracture (Right)  SURGEON:  Surgeon(s) and Role:    * Raychelle Hudman, Steffanie Dunn, MD - Primary  PHYSICIAN ASSISTANT: [place], RNFA  ASSISTANTS: none   ANESTHESIA:   general  EBL:  15   BLOOD ADMINISTERED:none  DRAINS: none   LOCAL MEDICATIONS USED:  LIDOCAINE   SPECIMEN:  No Specimen  DISPOSITION OF SPECIMEN:  N/A  COUNTS:  YES  TOURNIQUET:  * No tourniquets in log *  DICTATION: .Dragon Dictation  PLAN OF CARE: Admit  PATIENT DISPOSITION:  PACU - hemodynamically stable.   Delay start of Pharmacological VTE agent (>24hrs) due to surgical blood loss or risk of bleeding: not applicable

## 2020-08-24 NOTE — NC FL2 (Signed)
[place] MEDICAID FL2 LEVEL OF CARE SCREENING TOOL     IDENTIFICATION  Patient Name: Sydney [name] Birthdate: [birth year] Sex: female Admission Date (Current Location): 08/18/2020  [facility] and [place] Number:  Herbalist and Address:  [facility] [place], [place] [address] [place], [place] [address]      Provider Number: [id number]  Attending Physician Name and Address:  Barb Merino, MD  Relative Name and Phone Number:       Current Level of Care: SNF Recommended Level of Care: [place] Prior Approval Number:    Date Approved/Denied: 08/24/20 PASRR Number: [id number]  Discharge Plan: SNF    Current Diagnoses: Patient Active Problem List   Diagnosis Date Noted  . Head injury 08/22/2020  . Fall 08/18/2020  . Right orbital fracture, closed, initial encounter ([place]) 08/18/2020  . Tinnitus 08/08/2020  . Stage 3a chronic kidney disease ([place]) 03/30/2020  . Osteoporosis 09/02/2019  . Polymyalgia rheumatica ([place]) 06/12/2016  . Hyperlipemia 07/23/2015  . Generalized osteoarthritis of multiple sites 07/14/2014  . Advanced directives, counseling/discussion 12/02/2013  . Routine general medical examination at a health care facility 11/30/2012  . GERD (gastroesophageal reflux disease)   . Iliac artery aneurysm, right ([place]) 01/18/2011  . Obesity, unspecified 01/02/2009  . DEGENERATIVE DISC DISEASE, LUMBAR SPINE 01/02/2009  . Essential hypertension, benign 01/14/2007    Orientation RESPIRATION BLADDER Height & Weight     Self,Time,Situation,Place  Normal External catheter Weight:   Height:     BEHAVIORAL SYMPTOMS/MOOD NEUROLOGICAL BOWEL NUTRITION STATUS      Continent Diet (See d/c summary)  AMBULATORY STATUS COMMUNICATION OF NEEDS Skin   Extensive Assist Verbally Normal                       Personal Care Assistance Level of Assistance  Bathing,Feeding,Dressing Bathing Assistance: Limited assistance Feeding assistance:  Independent Dressing Assistance: Limited assistance     Functional Limitations Info  Sight,Hearing,Speech Sight Info: Adequate Hearing Info: Adequate      SPECIAL CARE FACTORS FREQUENCY                       Contractures Contractures Info: Not present    Additional Factors Info  Code Status,Allergies Code Status Info: DNR Allergies Info: Lipitor (atorvastatin), latex           Current Medications (08/24/2020):  This is the current hospital active medication list Current Facility-Administered Medications  Medication Dose Route Frequency Provider Last Rate Last Admin  . acetaminophen (TYLENOL) tablet 500 mg  500 mg Oral Q6H PRN Darliss Cheney, MD   500 mg at 08/23/20 1840  . bisacodyl (DULCOLAX) EC tablet 5 mg  5 mg Oral Daily PRN Caren Griffins, MD   5 mg at 08/20/20 0255  . cyclobenzaprine (FLEXERIL) tablet 5 mg  5 mg Oral TID PRN Caren Griffins, MD   5 mg at 08/23/20 1840  . diclofenac Sodium (VOLTAREN) 1 % topical gel 2 g  2 g Topical QID Pokhrel, Laxman, MD   2 g at 08/23/20 2021  . hydrALAZINE (APRESOLINE) injection 10 mg  10 mg Intravenous Once Charlesetta Shanks, MD      . hydrALAZINE (APRESOLINE) injection 10 mg  10 mg Intravenous Q4H PRN Pahwani, Ravi, MD      . morphine 2 MG/ML injection 2 mg  2 mg Intravenous Q2H PRN Charlesetta Shanks, MD   2 mg at 08/20/20 0255  . ondansetron (ZOFRAN) injection  4 mg  4 mg Intravenous Q4H PRN Charlesetta Shanks, MD   4 mg at 08/18/20 0856  . ondansetron (ZOFRAN) tablet 4 mg  4 mg Oral Q6H PRN Darliss Cheney, MD   4 mg at 08/20/20 1539   Or  . ondansetron (ZOFRAN) injection 4 mg  4 mg Intravenous Q6H PRN Darliss Cheney, MD      . oxyCODONE (Oxy IR/ROXICODONE) immediate release tablet 5 mg  5 mg Oral Q4H PRN Darliss Cheney, MD   5 mg at 08/22/20 1845  . pantoprazole (PROTONIX) EC tablet 40 mg  40 mg Oral Daily Darliss Cheney, MD   40 mg at 08/23/20 0827  . potassium & sodium phosphates (PHOS-NAK) 280-160-250 MG packet 1 packet  1  packet Oral TID WC & HS Barb Merino, MD   1 packet at 08/23/20 2022  . senna (SENOKOT) tablet 17.2 mg  2 tablet Oral Daily Lovorn, Megan, MD   17.2 mg at 08/23/20 0827     Discharge Medications: Please see discharge summary for a list of discharge medications.  Relevant Imaging Results:  Relevant Lab Results:   Additional Information Social Security number [id number]  Sydney [name], [place]

## 2020-08-24 NOTE — Transfer of Care (Signed)
Immediate Anesthesia Transfer of Care Note  Patient: Sydney [name]  Procedure(s) Performed: open reduction internal fixation of right orbital floor fracture (Right Eye)  Patient Location: PACU  Anesthesia Type:General  Level of Consciousness: drowsy and patient cooperative  Airway & Oxygen Therapy: Patient Spontanous Breathing  Post-op Assessment: Report given to RN and Post -op Vital signs reviewed and stable  Post vital signs: Reviewed and stable  Last Vitals:  Vitals Value Taken Time  BP 161/96 08/24/20 1535  Temp    Pulse 98 08/24/20 1538  Resp 16 08/24/20 1538  SpO2 95 % 08/24/20 1538  Vitals shown include unvalidated device data.  Last Pain:  Vitals:   08/24/20 1146  TempSrc: Oral  PainSc:       Patients Stated Pain Goal: 0 (09/47/09 6283)  Complications: No complications documented.

## 2020-08-24 NOTE — Progress Notes (Signed)
Patient doing well after orbital floor repair.  Slightly proptotic but no double vision.  Reports normal vision from right eye.  EOM are intact.  Recommend bacitracin or vaseline to incision BID.  Recommend elevating head of bed and ice as needed to reduce swelling.  Should be ok for discharge to SNF from my standpoint.  Call with any questions.

## 2020-08-24 NOTE — H&P (Signed)
Physical Medicine and Rehabilitation Admission H&P    Chief Complaint  Patient presents with  . Fall  : HPI: Sydney [name] is an 87 year old right-handed female with history of hypertension, hyperlipidemia, PMR, iliac artery aneurysm, bilateral hip replacements.  Per chart review lives alone.  Two-level home bed and bath main level 3 steps to entry.  Independent with a walker.  She has a son and granddaughter locally who can assist as needed.  She has a daughter also in [place] who will be assisting for a short time on discharge.  Presented 08/18/2020 after mechanical fall while walking to the bathroom when she slipped and struck her head on a dresser.  Denied loss of consciousness.  Denied any chest pain or shortness of breath associated with the fall.  Cranial CT scan showed small amount of subarachnoid hemorrhage of the anterior left frontal lobe.  No other acute intracranial abnormality.  No skull fracture.  Patient sustained comminuted depressed right orbital floor fracture with fracture also involving the adjacent anterior inferior right medial orbital wall as well as right side exophthalmos.  CT cervical spine negative for fracture.  Patient sustained right wrist subtle nondisplaced fracture of the distal radial metaphysis with an intra-articular component.  No dislocation.  Admission chemistries unremarkable aside glucose 135.  Neurosurgery follow-up for small traumatic SAH/ICH Dr. Christella Noa no surgical intervention and monitored.  Follow-up plastic surgery Dr. Claudia Desanctis in regards to right orbital floor fracture and underwent reconstruction of orbital floor 08/24/2020 per Dr. Claudia Desanctis.  Advised conservative care of right nondisplaced fracture of distal radius nonweightbearing with cock up splint and no current surgical intervention.  Therapy evaluations completed due to patient decreased functional mobility was admitted for a comprehensive rehab program. Denies pain.   Review of Systems  Constitutional:  Negative for chills.  HENT: Negative for hearing loss.   Eyes: Negative for blurred vision and double vision.  Respiratory: Negative for cough and shortness of breath.   Cardiovascular: Negative for chest pain, palpitations and leg swelling.  Gastrointestinal: Positive for constipation. Negative for heartburn, nausea and vomiting.       GERD  Genitourinary: Negative for dysuria, flank pain and hematuria.  Musculoskeletal: Positive for back pain, joint pain and myalgias.  Skin: Negative for rash.  Neurological: Positive for weakness.  All other systems reviewed and are negative.  Past Medical History:  Diagnosis Date  . GERD (gastroesophageal reflux disease)   . Hx of lumbosacral spine surgery t  . Hyperlipidemia   . Hypertension   . Iliac artery aneurysm ([place])   . Lumbar spinal stenosis   . Polymyalgia rheumatica ([place]) 06/2016   Past Surgical History:  Procedure Laterality Date  . JOINT REPLACEMENT  1992   right hip replacement  . JOINT REPLACEMENT  2002   left hip replacement  . ROTATOR CUFF REPAIR  2002   Left shoulder  . SPINE SURGERY  2006 & 2010   posterior spinal fusion L4-5 L5S1   Family History  Problem Relation Age of Onset  . Heart disease Mother   . Heart disease Father        Heart Disease before age 26  . Heart attack Father   . Hyperlipidemia Father   . Heart disease Sister   . Diabetes Sister   . Heart disease Brother   . Hyperlipidemia Brother   . Heart disease Brother   . Cancer Brother    Social History:  reports that she has never smoked. She has never used  smokeless tobacco. She reports that she does not drink alcohol and does not use drugs. Allergies:  Allergies  Allergen Reactions  . Lipitor [Atorvastatin]     Dizzy ( fuzziness)  . Latex Rash   Medications Prior to Admission  Medication Sig Dispense Refill  . acetaminophen (TYLENOL) 500 MG tablet Take 500 mg by mouth as needed for mild pain.     Marland Kitchen aspirin EC 81 MG tablet Take 81 mg by  mouth daily. Swallow whole.    . furosemide (LASIX) 20 MG tablet Take 2 tablets (40 mg total) by mouth daily as needed. For morning leg swelling (Patient taking differently: Take 40 mg by mouth every other day.) 1 tablet 0  . nadolol (CORGARD) 40 MG tablet TAKE 1 TABLET BY MOUTH EVERY DAY (Patient taking differently: Take 40 mg by mouth daily.) 90 tablet 3  . pantoprazole (PROTONIX) 40 MG tablet Take 1 tablet (40 mg total) by mouth daily. 90 tablet 3  . calcium carbonate (OS-CAL - DOSED IN MG OF ELEMENTAL CALCIUM) 1250 (500 Ca) MG tablet Take 1 tablet (500 mg of elemental calcium total) by mouth 2 (two) times daily with a meal. 60 tablet 0    Drug Regimen Review Drug regimen was reviewed and remains appropriate with no significant issues identified  Home: Home Living Family/patient expects to be discharged to:: Private residence Living Arrangements: Alone (daughter, [name], here from [place] to assist) Available Help at Discharge: Family,Available 24 hours/day Type of Home: House Home Access: Stairs to enter [employer] of Steps: 3 Entrance Stairs-Rails: Left Home Layout: One level Bathroom Shower/Tub: Walk-in shower,Other (comment) (walk in tub) Bathroom Toilet: Handicapped height Bathroom Accessibility: Yes Home Equipment: Cane - single point,Walker - 4 wheels,Shower seat Additional Comments: Pt's spouse died in May 06, 2022.  She has a son and grand daughter locally who assist as needed.  Lives With: Alone (spouse died May 06, 2022)   Functional History: Prior Function Level of Independence: Independent with assistive device(s) Comments: uses a rollator in the house and a cane in the community.  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  Since the onset of the pandemic, she stopped driving and going out in the community.  She has a local son and grand daughter who provide transportation and get her groceries.  Functional Status:  Mobility: Bed Mobility Overal  bed mobility: Needs Assistance Bed Mobility: Supine to Sit Rolling: Mod assist Supine to sit: Min assist Sit to supine: Mod assist,HOB elevated General bed mobility comments: Min assistance to move LEs to edge of bed and elevate trunk into a seated position at edge of bed. Transfers Overall transfer level: Needs assistance Equipment used: Right platform walker Transfers: Sit to/from Stand Sit to Stand: Min assist,From elevated surface Stand pivot transfers: Mod assist,+2 physical assistance General transfer comment: Cues for hand placement and safety to maintain R elbow weight bearing. Ambulation/Gait Ambulation/Gait assistance: Mod assist Gait Distance (Feet): 25 Feet Assistive device: Right platform walker Gait Pattern/deviations: Step-to pattern,Trunk flexed,Decreased stride length,Decreased stance time - right General Gait Details: Cues for upper trunk control, advancement of RW and increasing foot clearance on LLE.  noted to shuffle LLE forward due to R LE pain in stance phase. Gait velocity: remains slow but speed improved. Stairs: Yes Stairs assistance: Mod assist Stair Management: One rail Left,Step to pattern,Forwards Number of Stairs: 2 General stair comments: Attempted to go up with one or both hands on L rail, however, pt with too much pain in R groin while  stepping LLE up; tehn used L hand on rail and RUE over the shoulder assist from PT; able to go up 2 steps in this fashion; pt exhausted and requested to stop at 2 steps    ADL: ADL Overall ADL's : Needs assistance/impaired Eating/Feeding: Set up,Sitting Eating/Feeding Details (indicate cue type and reason): Pt able to bring cup to her mouth and drink Grooming: Wash/dry hands,Wash/dry face,Brushing hair,Sitting Grooming Details (indicate cue type and reason): completed sitting EOB Upper Body Bathing: Minimal assistance,Sitting Lower Body Bathing: Moderate assistance,Sitting/lateral leans,Sit to/from stand Lower Body  Bathing Details (indicate cue type and reason): completed standing EOB, pt able to complete pericare, OT assisted with legs and bottom. Upper Body Dressing : Minimal assistance,Sitting Lower Body Dressing: Moderate assistance,Sit to/from stand Lower Body Dressing Details (indicate cue type and reason): Pt able to don/doff Lt sock, but unable to don/doff Rt sock due to Rt groin pain Toilet Transfer: Min Public house manager Details (indicate cue type and reason): simulated with side steps and forward steps from bed. Complete x3 Toileting- Clothing Manipulation and Hygiene: Min guard,Sitting/lateral lean Toileting - Clothing Manipulation Details (indicate cue type and reason): Pt able to complete pericare Functional mobility during ADLs: Min guard,Rolling walker General ADL Comments: Pt taking small steps for transfers this session, stood x3 with R platform RW. Pt able to stand and complete pericare with min guard, needed mod assist for legs and feet. Grooming completed with set up EOB  Cognition: Cognition Overall Cognitive Status: Within Functional Limits for tasks assessed Cognition Arousal/Alertness: Awake/alert Behavior During Therapy: WFL for tasks assessed/performed Overall Cognitive Status: Within Functional Limits for tasks assessed Area of Impairment: Following commands,Safety/judgement,Problem solving Following Commands: Follows one step commands with increased time Safety/Judgement: Decreased awareness of safety,Decreased awareness of deficits Problem Solving: Slow processing,Difficulty sequencing,Requires verbal cues,Requires tactile cues General Comments: Not specifically tested, but followed commands, conversation normal, processing speed seemed normal today  Physical Exam: Blood pressure (!) 130/91, pulse 97, temperature 98.2 F (36.8 C), temperature source Oral, resp. rate 17, SpO2 96 %. Physical Exam Gen: no distress, normal appearing HEENT: oral mucosa  pink and moist, Significant bruising right orbital area. CN appear to be intact Cardio: Reg rate Chest: normal effort, normal rate of breathing Abd: soft, non-distended Ext: no edema Psych: pleasant, normal affect Skin: intact  Musculoskeletal:     Comments: Right wrist with splint in place  Neurological:     Comments: Patient is alert and oriented x3 nad in no acute distress.  Makes eye contact with examiner.  Follows commands.  Oriented to person place and time. Moving right fingers and other extremities well.     No results found for this or any previous visit (from the past 48 hour(s)). DG Knee 1-2 Views Right  Result Date: 08/22/2020 CLINICAL DATA:  Pain and swelling following recent injury EXAM: RIGHT KNEE - 1-2 VIEW COMPARISON:  None. FINDINGS: Frontal and lateral views were obtained. There is lateral patellar subluxation. No fracture or frank dislocation. Small joint effusion. There is severe joint space narrowing medially and laterally with moderate narrowing of the patellofemoral joint. There is spurring in all compartments. No erosive change. There are foci of chondrocalcinosis. IMPRESSION: Lateral patellar subluxation. No fracture or dislocation. Small joint effusion. Extensive osteoarthritic change with narrowing most severe medially and laterally. Spurring noted in all compartments. There is chondrocalcinosis which may be seen with osteoarthritis or with calcium pyrophosphate deposition disease. Electronically Signed   By: Lowella Grip III M.D.   On: 08/22/2020  14:55    Medical Problem List and Plan: 1.  Traumatic SAH/ICH, right orbital fracture as well as right wrist fracture secondary to mechanical fall 08/18/2020.  Status post reconstruction of orbital floor 08/24/2020  -patient may shower but incisions must be covered  -ELOS/Goals: 7-10 days modI 2.  Antithrombotics: -DVT/anticoagulation: SCDs  -antiplatelet therapy: N/A 3. Post-surgical pain: d/c IV morphine. Continue  oxycodone as needed as well as Flexeril 4. Mood: Provide emotional support  -antipsychotic agents: N/A 5. Neuropsych: This patient is capable of making decisions on her own behalf. 6. Skin/Wound Care: Routine skin checks 7. Fluids/Electrolytes/Nutrition: Routine in and outs with follow-up chemistries 8.  Right nondisplaced fracture of distal radius.  Nonweightbearing at the wrist.  Cock up splint.  Patient is using a platform walker.  No surgical intervention 9.  Hypertension.  Patient on Corgard 40 mg daily prior to admission stopped due to bradycardia.  Resume as needed. Monitor BP TID 10.  GERD.  Protonix 11.  Constipation.  Senokot daily, Dulcolax tablet daily as needed 12. Right knee severe OA: continue voltaren gel.   I have personally performed a face to face diagnostic evaluation, including, but not limited to relevant history and physical exam findings, of this patient and developed relevant assessment and plan.  Additionally, I have reviewed and concur with the physician assistant's documentation above.  Leeroy Cha, MD  Lavon Paganini [place], PA-C 08/24/2020

## 2020-08-24 NOTE — Progress Notes (Signed)
Physical Therapy Treatment Patient Details Name: Sydney [name] MRN: [id number] DOB: [birth year] Today's Date: 08/24/2020    History of Present Illness 87 y.o. female admitted 08/18/20 after mechanical fall resulting in R orbital fx and R sided exophthalmos, minimal punctate traumatic intercranial hemorrhage and subarachnoid hemorrhage with no mass effect, R wrist fx noted on xray ( 08/18/20) with wrist cock-up splint in place (NWB through hand, ok to WB through elbow).  WUJ:WJXB, HTN, hyperlipidemia, pelvic fx (2022), B THR, Spinal Stenosis, lumbar fusion x 2, polymyalgia rheumatica.    PT Comments    Pt supine in bed on arrival.  Plans for surgery this pm.  Performed gt training and tolilet transfer before back to bed.  Post tx resting in bed awaiting transport to surgery.  Pt continues to require min assistance this session.  Continue to recommend aggressive rehab in a post acute setting.    Follow Up Recommendations  CIR     Equipment Recommendations  Rolling walker with 5" wheels;Other (comment);Hospital bed;3in1 (PT);Wheelchair (measurements PT);Wheelchair cushion (measurements PT)    Recommendations for Other Services       Precautions / Restrictions Precautions Precautions: Fall;Other (comment) (R wrist fx) Precaution Comments: NWB in RUE, can WB through elbow as tolerated. Required Braces or Orthoses: Splint/Cast Splint/Cast: R cockup wrist splint. Restrictions Weight Bearing Restrictions: Yes RUE Weight Bearing: Weight bear through elbow only Other Position/Activity Restrictions: ok to WB through elbow on platform    Mobility  Bed Mobility Overal bed mobility: Needs Assistance Bed Mobility: Supine to Sit;Sit to Supine     Supine to sit: Min guard Sit to supine: Min guard   General bed mobility comments: min guard to move oob and back into bed.    Transfers Overall transfer level: Needs assistance Equipment used: Right platform walker Transfers: Sit to/from  Stand Sit to Stand: Min assist         General transfer comment: Cues for hand placement and safety to maintain R elbow weight bearing.  Mild posterior lean in standing.  Ambulation/Gait Ambulation/Gait assistance: Mod assist Gait Distance (Feet): 35 Feet Assistive device: Right platform walker Gait Pattern/deviations: Step-to pattern;Trunk flexed;Decreased stride length;Decreased stance time - right Gait velocity: remains slow but speed improved.   General Gait Details: Cues for upper trunk control, advancement of RW and increasing foot clearance on LLE.  noted to shuffle LLE forward due to R LE pain in stance phase.  Fatigues quickly with increase WOb but able to progress gt distance.   Stairs             Wheelchair Mobility    Modified Rankin (Stroke Patients Only)       Balance Overall balance assessment: Needs assistance Sitting-balance support: Feet supported Sitting balance-Leahy Scale: Good       Standing balance-Leahy Scale: Fair                              Cognition Arousal/Alertness: Awake/alert Behavior During Therapy: WFL for tasks assessed/performed Overall Cognitive Status: Within Functional Limits for tasks assessed                                        Exercises      General Comments        Pertinent Vitals/Pain Pain Assessment: 0-10 Pain Score: 3  Pain Location: R LE and R wrist  Pain Descriptors / Indicators: Guarding;Discomfort Pain Intervention(s): Monitored during session;Repositioned    Home Living                      Prior Function            PT Goals (current goals can now be found in the care plan section) Acute Rehab PT Goals Patient Stated Goal: to have less pain Potential to Achieve Goals: Good Progress towards PT goals: Progressing toward goals    Frequency    Min 5X/week      PT Plan Current plan remains appropriate    Co-evaluation              AM-PAC  PT "6 Clicks" Mobility   Outcome Measure  Help needed turning from your back to your side while in a flat bed without using bedrails?: A Little Help needed moving from lying on your back to sitting on the side of a flat bed without using bedrails?: A Little Help needed moving to and from a bed to a chair (including a wheelchair)?: A Little Help needed standing up from a chair using your arms (e.g., wheelchair or bedside chair)?: A Little Help needed to walk in hospital room?: A Little Help needed climbing 3-5 steps with a railing? : A Little 6 Click Score: 18    End of Session Equipment Utilized During Treatment: Gait belt Activity Tolerance: Patient tolerated treatment well Patient left: in bed;with call bell/phone within reach;with family/visitor present Nurse Communication: Mobility status PT Visit Diagnosis: Unsteadiness on feet (R26.81);Other abnormalities of gait and mobility (R26.89);Pain Pain - Right/Left: Right Pain - part of body: Arm     Time: 6659-9357 PT Time Calculation (min) (ACUTE ONLY): 14 min  Charges:  $Gait Training: 8-22 mins                     Erasmo Leventhal , PTA Acute Rehabilitation Services Pager [phone] Office [phone]     Trevyn Lumpkin Eli Hose 08/24/2020, 2:56 PM

## 2020-08-24 NOTE — Interval H&P Note (Signed)
History and Physical Interval Note:  08/24/2020 1:42 PM  Sydney [name]  has presented today for surgery, with the diagnosis of right orbital fracture.  The various methods of treatment have been discussed with the patient and family. After consideration of risks, benefits and other options for treatment, the patient has consented to  Procedure(s) with comments: open reduction internal fixation of right orbital floor fracture (Right) - 60 min as a surgical intervention.  The patient's history has been reviewed, patient examined, no change in status, stable for surgery.  I have reviewed the patient's chart and labs.  Questions were answered to the patient's satisfaction.     Gonzalo Waymire S Ammi Hutt   

## 2020-08-24 NOTE — Interval H&P Note (Signed)
History and Physical Interval Note:  08/24/2020 1:42 PM  Sydney [name]  has presented today for surgery, with the diagnosis of right orbital fracture.  The various methods of treatment have been discussed with the patient and family. After consideration of risks, benefits and other options for treatment, the patient has consented to  Procedure(s) with comments: open reduction internal fixation of right orbital floor fracture (Right) - 60 min as a surgical intervention.  The patient's history has been reviewed, patient examined, no change in status, stable for surgery.  I have reviewed the patient's chart and labs.  Questions were answered to the patient's satisfaction.     Cindra Presume

## 2020-08-24 NOTE — Progress Notes (Signed)
Inpatient Rehabilitation Admissions Coordinator  I have received insurance approval to admit her to inpatient rehab pending postop course and bed availability early next week. Patient in surgery, but I spoke with her daughter, [name]. I will follow up on Monday.  Danne Baxter, RN, MSN Rehab Admissions Coordinator [phone] 08/24/2020 2:47 PM

## 2020-08-24 NOTE — Anesthesia Postprocedure Evaluation (Signed)
Anesthesia Post Note  Patient: Sydney [name]  Procedure(s) Performed: open reduction internal fixation of right orbital floor fracture (Right Eye)     Patient location during evaluation: PACU Anesthesia Type: General Level of consciousness: awake and alert, patient cooperative and oriented Pain management: pain level controlled Vital Signs Assessment: post-procedure vital signs reviewed and stable Respiratory status: spontaneous breathing, nonlabored ventilation and respiratory function stable Cardiovascular status: blood pressure returned to baseline and stable Postop Assessment: no apparent nausea or vomiting Anesthetic complications: no   No complications documented.  Last Vitals:  Vitals:   08/24/20 1550 08/24/20 1605  BP: (!) 154/83 (!) 147/82  Pulse: 92 84  Resp: 17 14  Temp:    SpO2: 94% 95%    Last Pain:  Vitals:   08/24/20 1605  TempSrc:   PainSc: Asleep                 Abel Ra,E. Nyajah Hyson

## 2020-08-24 NOTE — Op Note (Signed)
Operative Note   DATE OF OPERATION: 08/24/2020  SURGICAL DEPARTMENT: Plastic Surgery  PREOPERATIVE DIAGNOSES:  Right orbital floor fracture  POSTOPERATIVE DIAGNOSES:  same  PROCEDURE: Open repair of right orbital floor fracture  SURGEON: Talmadge Coventry, MD  ASSISTANT: [place], RNFA The advanced practice practitioner (APP) assisted throughout the case.  The APP was essential in retraction and counter traction when needed to make the case progress smoothly.  This retraction and assistance made it possible to see the tissue plans for the procedure.  The assistance was needed for blood control, tissue re-approximation and assisted with closure of the incision site.  ANESTHESIA:  General.   COMPLICATIONS: None.   INDICATIONS FOR PROCEDURE:  The patient, Sydney [name] is a 87 y.o. female born on Sep 06, 1932, is here for treatment of orbital floor fracture.  This was a large right orbital floor blowout fracture.  She sustained this fracture this past Saturday.  She was initially very exophthalmic and was evaluated by ophthalmology in the emergency room.  No intervention was required from them.  Over the past several days the swelling is gone down quite a bit and she has now enophthalmic on exam.  Due to the large blowout fracture and enophthalmos we elected to proceed with open repair of the orbital floor. MRN: [id number]  CONSENT:  Informed consent was obtained directly from the patient. Risks, benefits and alternatives were fully discussed. Specific risks including but not limited to bleeding, infection, hematoma, seroma, scarring, pain, contracture, asymmetry, wound healing problems, and need for further surgery were all discussed. The patient did have an ample opportunity to have questions answered to satisfaction.  We discussed risks of the eye that include damage from pressure and corneal abrasions.  DESCRIPTION OF PROCEDURE:  The patient was taken to the operating room. SCDs were  placed and antibiotics were given.  General anesthesia was administered.  The patient's operative site was prepped and draped in a sterile fashion. A time out was performed and all information was confirmed to be correct.  Started by marking out a direct approach to the orbital floor and a crease at her lid cheek junction.  This was injected with lidocaine with epinephrine.  Corneal protector was placed.  I then made the incision with a 15 blade and did a stairstep cut through the orbicularis to get down to the orbital septum.  Preseptal dissection was then performed down to the orbital floor.  I then used cautery to incise the periosteum and a Soil scientist was used to do subperiosteal dissection back into the orbit along the orbital floor.  There was significant orbital fat contents that had drifted down into the sinus and these were brought back up into the orbit.  I did a complete complete dissection and identified both the medial and lateral border of the defect and the defect did extend back fairly far posteriorly.  I then brought the [employer] Medpor Titan plate onto the field.  This was cut quite a bit to fit easily into the orbit without having to be forced.  This did seem to span the defect but certainly was able to support the soft tissues and prevent their descent down into the sinus.  This was secured to with two 4 mm self drilling screws into the orbital rim.  The remainder of the screw holes were cut off the plate so they would not irritate her.  I confirmed that the plate was flush with the orbital floor and that there were no  entrapped contents by visual inspection.  Corneal protector was then removed and I then did a forced duction maneuver and although the eye was fairly swollen it seemed to move freely with the plate in place.  With the plate in place she was proptotic compared to the left side by few millimeters.  I attributed this to the swollen tissues which were now retrieved from the sinus  back into the orbit.  At this point I ensured hemostasis which was the case throughout.  Closure was done with interrupted buried 5-0 Vicryl sutures in the orbicularis and a running 5-0 fast gut for the skin.  Vaseline was applied and the eye was irrigated with BSS solution.  The patient tolerated the procedure well.  There were no complications. The patient was allowed to wake from anesthesia, extubated and taken to the recovery room in satisfactory condition.

## 2020-08-24 NOTE — Progress Notes (Signed)
PROGRESS NOTE    Sydney [name]  EHU:[id number] DOB: 1932-07-03 DOA: 08/18/2020 PCP: Venia Carbon, MD    Brief Narrative:  87 year old female with history of GERD, hypertension, hyperlipidemia brought to the hospital after sustaining fall at home.  She fell and hit her head.  No prodromal symptoms.  She was found to have multiple injuries including right orbital blowout fracture, small intracranial hemorrhage and right wrist fracture.  Seen by neurosurgery, trauma surgery and ophthalmology during hospitalization.   Assessment & Plan:   Active Problems:   Fall   Right orbital fracture, closed, initial encounter ([place])   Head injury  Right orbital blowout fracture/retrobulbar hematoma: Seen by Ophthalmology Dr. Gillian Scarce in the emergency room.  Recommended supportive care.  Follow-up with [facility] after discharge.  Clinically improving. Patient seen by plastic surgery .  Due to significant orbital floor blowout fracture, needing reconstruction.  Scheduled for reconstruction of orbital floor today.   Right wrist fracture: Seen by orthopedics.  Recommended nonweightbearing on the wrist, short arm splint, ice compression and mobility. Bilateral subluxation of the patella, chronic.  Work with PT OT.  Minimal punctate traumatic intracranial hemorrhage and subarachnoid hemorrhage with no complication: Seen by neurosurgery.  Conservative management.  Fall/deconditioning: Work with PT OT.  Transfer to rehab after surgery.  Essential hypertension: Normotensive.  Was on nadolol that was stopped due to bradycardia.  Will discontinue.  Continue mobility.  Surgery today. Possibly can go to rehab in next 24 to 48 hours after surgery.   DVT prophylaxis: SCD's Start: 08/24/20 0755 SCDs Start: 08/18/20 1429   Code Status: DNR Family Communication: Daughter at the bedside Disposition Plan: Status is: Inpatient  Remains inpatient appropriate because:Unsafe d/c  plan   Dispo: The patient is from: Home              Anticipated d/c is to: CIR              Patient currently is not medically stable.  For surgical intervention today.   Difficult to place patient No         Consultants:   Neurosurgery  Ophthalmology  Plastic/trauma  surgery  Procedures:   None  Antimicrobials:   None   Subjective: Patient seen and examined.  No overnight events.  Expecting to go to surgery today.  Daughter at the bedside. Patient was very satisfied to the extent surgeons explained to her about the procedure.  Objective: Vitals:   08/23/20 0356 08/23/20 1609 08/23/20 2036 08/24/20 0453  BP: (!) 137/103 115/70 118/75 (!) 130/91  Pulse: 83 75 79 97  Resp: 18 16 17 17   Temp: (!) 97.4 F (36.3 C) (!) 97.2 F (36.2 C) 98 F (36.7 C) 98.2 F (36.8 C)  TempSrc: Oral Oral  Oral  SpO2: 100% 98% 95% 96%    Intake/Output Summary (Last 24 hours) at 08/24/2020 1051 Last data filed at 08/24/2020 0452 Gross per 24 hour  Intake 220 ml  Output 450 ml  Net -230 ml   There were no vitals filed for this visit.  Examination:  General exam: Appears calm and comfortable  Patient has ecchymosis around right orbit, minimal swelling.  Normal vision. Subconjunctival hemorrhage present. Respiratory system: Clear to auscultation. Respiratory effort normal. Cardiovascular system: S1 & S2 heard, RRR. No JVD, murmurs, rubs, gallops or clicks. No pedal edema. Gastrointestinal system: Abdomen is nondistended, soft and nontender. No organomegaly or masses felt. Normal bowel sounds heard. Central nervous system: Alert oriented x4.  No  focal deficits. Right wrist on splint.  Less painful today.     Data Reviewed: I have personally reviewed following labs and imaging studies  CBC: Recent Labs  Lab 08/18/20 0837 08/18/20 0852 08/19/20 0057 08/22/20 0134  WBC 9.2  --  6.8 7.4  NEUTROABS 7.3  --   --   --   HGB 15.2* 15.0 12.3 11.3*  HCT 46.4* 44.0 38.0  34.9*  MCV 93.5  --  95.0 94.3  PLT 198  --  174 867*   Basic Metabolic Panel: Recent Labs  Lab 08/18/20 0837 08/18/20 0852 08/19/20 0057 08/22/20 0134  NA 140 141 139 137  K 4.3 4.2 3.8 3.6  CL 108 110 106 105  CO2 21*  --  27 25  GLUCOSE 135* 135* 110* 102*  BUN 13 14 10 9   CREATININE 0.89 0.80 0.85 0.74  CALCIUM 9.6  --  9.1 8.7*  MG  --   --   --  2.0  PHOS  --   --   --  2.0*   GFR: CrCl cannot be calculated (Unknown ideal weight.). Liver Function Tests: Recent Labs  Lab 08/18/20 0837 08/22/20 0134  AST 23 16  ALT 13 11  ALKPHOS 41 41  BILITOT 1.2 1.3*  PROT 6.5 5.4*  ALBUMIN 3.8 2.3*   No results for input(s): LIPASE, AMYLASE in the last 168 hours. No results for input(s): AMMONIA in the last 168 hours. Coagulation Profile: Recent Labs  Lab 08/18/20 0837  INR 1.1   Cardiac Enzymes: No results for input(s): CKTOTAL, CKMB, CKMBINDEX, TROPONINI in the last 168 hours. BNP (last 3 results) No results for input(s): PROBNP in the last 8760 hours. HbA1C: No results for input(s): HGBA1C in the last 72 hours. CBG: No results for input(s): GLUCAP in the last 168 hours. Lipid Profile: No results for input(s): CHOL, HDL, LDLCALC, TRIG, CHOLHDL, LDLDIRECT in the last 72 hours. Thyroid Function Tests: No results for input(s): TSH, T4TOTAL, FREET4, T3FREE, THYROIDAB in the last 72 hours. Anemia Panel: No results for input(s): VITAMINB12, FOLATE, FERRITIN, TIBC, IRON, RETICCTPCT in the last 72 hours. Sepsis Labs: No results for input(s): PROCALCITON, LATICACIDVEN in the last 168 hours.  Recent Results (from the past 240 hour(s))  Resp Panel by RT-PCR (Flu A&B, Covid) Nasopharyngeal Swab     Status: None   Collection Time: 08/18/20  9:11 AM   Specimen: Nasopharyngeal Swab; Nasopharyngeal(NP) swabs in vial transport medium  Result Value Ref Range Status   SARS Coronavirus 2 by RT PCR NEGATIVE NEGATIVE Final    Comment: (NOTE) SARS-CoV-2 target nucleic acids are  NOT DETECTED.  The SARS-CoV-2 RNA is generally detectable in upper respiratory specimens during the acute phase of infection. The lowest concentration of SARS-CoV-2 viral copies this assay can detect is 138 copies/mL. A negative result does not preclude SARS-Cov-2 infection and should not be used as the sole basis for treatment or other patient management decisions. A negative result may occur with  improper specimen collection/handling, submission of specimen other than nasopharyngeal swab, presence of viral mutation(s) within the areas targeted by this assay, and inadequate number of viral copies(<138 copies/mL). A negative result must be combined with clinical observations, patient history, and epidemiological information. The expected result is Negative.  Fact Sheet for Patients:  EntrepreneurPulse.com.au  Fact Sheet for Healthcare Providers:  IncredibleEmployment.be  This test is no t yet approved or cleared by the [place] FDA and  has been authorized for detection and/or diagnosis of SARS-CoV-2  by FDA under an Emergency Use Authorization (EUA). This EUA will remain  in effect (meaning this test can be used) for the duration of the COVID-19 declaration under Section 564(b)(1) of the Act, 21 U.S.C.section 360bbb-3(b)(1), unless the authorization is terminated  or revoked sooner.       Influenza A by PCR NEGATIVE NEGATIVE Final   Influenza B by PCR NEGATIVE NEGATIVE Final    Comment: (NOTE) The Xpert Xpress SARS-CoV-2/FLU/RSV plus assay is intended as an aid in the diagnosis of influenza from Nasopharyngeal swab specimens and should not be used as a sole basis for treatment. Nasal washings and aspirates are unacceptable for Xpert Xpress SARS-CoV-2/FLU/RSV testing.  Fact Sheet for Patients: EntrepreneurPulse.com.au  Fact Sheet for Healthcare Providers: IncredibleEmployment.be  This test is not  yet approved or cleared by the [place] FDA and has been authorized for detection and/or diagnosis of SARS-CoV-2 by FDA under an Emergency Use Authorization (EUA). This EUA will remain in effect (meaning this test can be used) for the duration of the COVID-19 declaration under Section 564(b)(1) of the Act, 21 U.S.C. section 360bbb-3(b)(1), unless the authorization is terminated or revoked.  Performed at [facility], [place] [address] [place], [place] [address]   Surgical pcr screen     Status: None   Collection Time: 08/24/20  8:59 AM   Specimen: Nasal Mucosa; Nasal Swab  Result Value Ref Range Status   MRSA, PCR NEGATIVE NEGATIVE Final   Staphylococcus aureus NEGATIVE NEGATIVE Final    Comment: (NOTE) The Xpert SA Assay (FDA approved for NASAL specimens in patients 37 years of age and older), is one component of a comprehensive surveillance program. It is not intended to diagnose infection nor to guide or monitor treatment. Performed at [facility], [place] [address] [place], [place] [address]          Radiology Studies: DG Knee 1-2 Views Right  Result Date: 08/22/2020 CLINICAL DATA:  Pain and swelling following recent injury EXAM: RIGHT KNEE - 1-2 VIEW COMPARISON:  None. FINDINGS: Frontal and lateral views were obtained. There is lateral patellar subluxation. No fracture or frank dislocation. Small joint effusion. There is severe joint space narrowing medially and laterally with moderate narrowing of the patellofemoral joint. There is spurring in all compartments. No erosive change. There are foci of chondrocalcinosis. IMPRESSION: Lateral patellar subluxation. No fracture or dislocation. Small joint effusion. Extensive osteoarthritic change with narrowing most severe medially and laterally. Spurring noted in all compartments. There is chondrocalcinosis which may be seen with osteoarthritis or with calcium pyrophosphate deposition disease. Electronically Signed   By:  Lowella Grip III M.D.   On: 08/22/2020 14:55        Scheduled Meds: . diclofenac Sodium  2 g Topical QID  . hydrALAZINE  10 mg Intravenous Once  . pantoprazole  40 mg Oral Daily  . potassium & sodium phosphates  1 packet Oral TID WC & HS  . senna  2 tablet Oral Daily   Continuous Infusions:   LOS: 2 days    Time spent: 30 minutes    Barb Merino, MD [facility] Pager [phone]

## 2020-08-24 NOTE — TOC Progression Note (Signed)
Transition of Care [facility]) - Progression Note    Patient Details  Name: SHELSEA HANGARTNER MRN: [id number] Date of Birth: 12-28-1932  Transition of Care [facility]) CM/SW [place], [place] Phone Number: 08/24/2020, 2:10 PM  Clinical Narrative:     Pt's preferred SNF, [facility], is not in network with pt's [employer]. CSW to follow up with pt after procedure regarding alternative SNF choices if auth for CIR is not approved.   Expected Discharge Plan: [place] Barriers to Discharge: Continued Medical Work up  Expected Discharge Plan and Services Expected Discharge Plan: [place] In-house Referral: Clinical Social Work Discharge Planning Services: CM Consult Post Acute Care Choice: [place] arrangements for the past 2 months: Single Family Home                 DME Arranged: N/A DME Agency: NA       HH Arranged: PT,OT [place] Agency: [facility] [employer] (now known as [employer]) Date HH Agency Contacted: 08/19/20 Time [place]: 1620 Representative spoke with at [place]: [place] ([place]) Interventions    Readmission Risk Interventions No flowsheet data found.

## 2020-08-25 MED ORDER — PHENOL 1.4 % MT LIQD
1.0000 | OROMUCOSAL | Status: DC | PRN
  Administered 2020-08-25: 1 via OROMUCOSAL
  Filled 2020-08-25: qty 177

## 2020-08-25 NOTE — Progress Notes (Signed)
PROGRESS NOTE    DEZIRAE SERVICE  OZD:[id number] DOB: 10-24-1932 DOA: 08/18/2020 PCP: Venia Carbon, MD    Brief Narrative:  87 year old female with history of GERD, hypertension, hyperlipidemia brought to the hospital after sustaining fall at home.  She fell and hit her head.  No prodromal symptoms.  She was found to have multiple injuries including right orbital blowout fracture, small intracranial hemorrhage and right wrist fracture.  Seen by neurosurgery, trauma surgery and ophthalmology during hospitalization.  Assessment & Plan:   Active Problems:   Fall   Right orbital fracture, closed, initial encounter ([place])   Head injury  Right orbital blowout fracture/retrobulbar hematoma: Seen by Ophthalmology Dr. Gillian Scarce in the emergency room.  Recommended supportive care.  Follow-up with [facility] ophthalmology associates after discharge.  Clinically improving. Patient seen by plastic surgery .  Due to significant orbital floor blowout fracture,  Patient underwent orbital floor reconstruction 5/13.  Surgically stable as per surgery.   Right wrist fracture: discussed with orthopedics.  Recommended nonweightbearing on the wrist, short arm splint, ice compression and mobility. Bilateral subluxation of the patella, chronic.  Work with PT OT.  Minimal punctate traumatic intracranial hemorrhage and subarachnoid hemorrhage with no complication: Seen by neurosurgery.  Conservative management.  Fall/deconditioning: Work with PT OT.  Transfer to rehab when bed available.  Essential hypertension: Normotensive.  Was on nadolol that was stopped due to bradycardia.  Will discontinue.  Patient is medically stabilizing.  Discharge to acute inpatient rehab when bed is available.   DVT prophylaxis: SCDs Start: 08/18/20 1429   Code Status: DNR Family Communication: None today. Disposition Plan: Status is: Inpatient  Remains inpatient appropriate because:Unsafe d/c plan   Dispo: The patient is  from: Home              Anticipated d/c is to: CIR              Patient currently is medically stable.   Difficult to place patient No         Consultants:   Neurosurgery  Ophthalmology  Plastic/trauma  surgery  Procedures:   None  Antimicrobials:   None   Subjective: Patient seen and examined.  No overnight events.  Very happy with surgical outcome.  Denies any visual problem. She has some throat pain due to endotracheal tube and wants some spray.  Objective: Vitals:   08/24/20 2000 08/25/20 0000 08/25/20 0446 08/25/20 0850  BP: 137/74 125/72 118/84 123/79  Pulse: 88 86 92 63  Resp: 16 14 15 15   Temp: 98.1 F (36.7 C) 97.8 F (36.6 C) 98.1 F (36.7 C) 97.9 F (36.6 C)  TempSrc: Oral Oral Oral Oral  SpO2: 97% 98% 96% 95%    Intake/Output Summary (Last 24 hours) at 08/25/2020 1141 Last data filed at 08/25/2020 0754 Gross per 24 hour  Intake 600 ml  Output 1400 ml  Net -800 ml   There were no vitals filed for this visit.  Examination:  General exam: Appears calm and comfortable. Patient has ecchymosis around right orbit, minimal swelling.  Normal vision. Subconjunctival hemorrhage present. Surgical incision looks clean and dry. Respiratory system: Clear to auscultation. Respiratory effort normal. Cardiovascular system: S1 & S2 heard, RRR. No JVD, murmurs, rubs, gallops or clicks. No pedal edema. Gastrointestinal system: Abdomen is nondistended, soft and nontender. No organomegaly or masses felt. Normal bowel sounds heard. Central nervous system: Alert oriented x4.  No focal deficits. Right wrist on splint.     Data Reviewed: I have  personally reviewed following labs and imaging studies  CBC: Recent Labs  Lab 08/19/20 0057 08/22/20 0134  WBC 6.8 7.4  HGB 12.3 11.3*  HCT 38.0 34.9*  MCV 95.0 94.3  PLT 174 355*   Basic Metabolic Panel: Recent Labs  Lab 08/19/20 0057 08/22/20 0134  NA 139 137  K 3.8 3.6  CL 106 105  CO2 27 25   GLUCOSE 110* 102*  BUN 10 9  CREATININE 0.85 0.74  CALCIUM 9.1 8.7*  MG  --  2.0  PHOS  --  2.0*   GFR: CrCl cannot be calculated (Unknown ideal weight.). Liver Function Tests: Recent Labs  Lab 08/22/20 0134  AST 16  ALT 11  ALKPHOS 41  BILITOT 1.3*  PROT 5.4*  ALBUMIN 2.3*   No results for input(s): LIPASE, AMYLASE in the last 168 hours. No results for input(s): AMMONIA in the last 168 hours. Coagulation Profile: No results for input(s): INR, PROTIME in the last 168 hours. Cardiac Enzymes: No results for input(s): CKTOTAL, CKMB, CKMBINDEX, TROPONINI in the last 168 hours. BNP (last 3 results) No results for input(s): PROBNP in the last 8760 hours. HbA1C: No results for input(s): HGBA1C in the last 72 hours. CBG: No results for input(s): GLUCAP in the last 168 hours. Lipid Profile: No results for input(s): CHOL, HDL, LDLCALC, TRIG, CHOLHDL, LDLDIRECT in the last 72 hours. Thyroid Function Tests: No results for input(s): TSH, T4TOTAL, FREET4, T3FREE, THYROIDAB in the last 72 hours. Anemia Panel: No results for input(s): VITAMINB12, FOLATE, FERRITIN, TIBC, IRON, RETICCTPCT in the last 72 hours. Sepsis Labs: No results for input(s): PROCALCITON, LATICACIDVEN in the last 168 hours.  Recent Results (from the past 240 hour(s))  Resp Panel by RT-PCR (Flu A&B, Covid) Nasopharyngeal Swab     Status: None   Collection Time: 08/18/20  9:11 AM   Specimen: Nasopharyngeal Swab; Nasopharyngeal(NP) swabs in vial transport medium  Result Value Ref Range Status   SARS Coronavirus 2 by RT PCR NEGATIVE NEGATIVE Final    Comment: (NOTE) SARS-CoV-2 target nucleic acids are NOT DETECTED.  The SARS-CoV-2 RNA is generally detectable in upper respiratory specimens during the acute phase of infection. The lowest concentration of SARS-CoV-2 viral copies this assay can detect is 138 copies/mL. A negative result does not preclude SARS-Cov-2 infection and should not be used as the sole basis  for treatment or other patient management decisions. A negative result may occur with  improper specimen collection/handling, submission of specimen other than nasopharyngeal swab, presence of viral mutation(s) within the areas targeted by this assay, and inadequate number of viral copies(<138 copies/mL). A negative result must be combined with clinical observations, patient history, and epidemiological information. The expected result is Negative.  Fact Sheet for Patients:  EntrepreneurPulse.com.au  Fact Sheet for Healthcare Providers:  IncredibleEmployment.be  This test is no t yet approved or cleared by the [place] FDA and  has been authorized for detection and/or diagnosis of SARS-CoV-2 by FDA under an Emergency Use Authorization (EUA). This EUA will remain  in effect (meaning this test can be used) for the duration of the COVID-19 declaration under Section 564(b)(1) of the Act, 21 U.S.C.section 360bbb-3(b)(1), unless the authorization is terminated  or revoked sooner.       Influenza A by PCR NEGATIVE NEGATIVE Final   Influenza B by PCR NEGATIVE NEGATIVE Final    Comment: (NOTE) The Xpert Xpress SARS-CoV-2/FLU/RSV plus assay is intended as an aid in the diagnosis of influenza from Nasopharyngeal swab specimens and  should not be used as a sole basis for treatment. Nasal washings and aspirates are unacceptable for Xpert Xpress SARS-CoV-2/FLU/RSV testing.  Fact Sheet for Patients: EntrepreneurPulse.com.au  Fact Sheet for Healthcare Providers: IncredibleEmployment.be  This test is not yet approved or cleared by the [place] FDA and has been authorized for detection and/or diagnosis of SARS-CoV-2 by FDA under an Emergency Use Authorization (EUA). This EUA will remain in effect (meaning this test can be used) for the duration of the COVID-19 declaration under Section 564(b)(1) of the Act, 21  U.S.C. section 360bbb-3(b)(1), unless the authorization is terminated or revoked.  Performed at [facility], [place] [address] [place], [place] [address]   Surgical pcr screen     Status: None   Collection Time: 08/24/20  8:59 AM   Specimen: Nasal Mucosa; Nasal Swab  Result Value Ref Range Status   MRSA, PCR NEGATIVE NEGATIVE Final   Staphylococcus aureus NEGATIVE NEGATIVE Final    Comment: (NOTE) The Xpert SA Assay (FDA approved for NASAL specimens in patients 33 years of age and older), is one component of a comprehensive surveillance program. It is not intended to diagnose infection nor to guide or monitor treatment. Performed at [facility], [place] [address] [place], [place] [address]          Radiology Studies: No results found.      Scheduled Meds: . diclofenac Sodium  2 g Topical QID  . hydrALAZINE  10 mg Intravenous Once  . pantoprazole  40 mg Oral Daily  . potassium & sodium phosphates  1 packet Oral TID WC & HS  . senna  2 tablet Oral Daily   Continuous Infusions:   LOS: 3 days    Time spent: 30 minutes    Barb Merino, MD [facility] Pager [phone]

## 2020-08-25 NOTE — Progress Notes (Signed)
Patient doing well after orbital floor repair yesterday.  Slightly proptotic but no double vision.  Reports normal vision from right eye.  EOM are intact.  Recommend bacitracin or vaseline to incision BID.  Recommend elevating head of bed and ice as needed to reduce swelling.  Should be ok for discharge to SNF from my standpoint.  Call with any questions.

## 2020-08-26 NOTE — Progress Notes (Signed)
PROGRESS NOTE    Sydney [name]  YBO:[id number] DOB: 10-14-1932 DOA: 08/18/2020 PCP: Sydney Carbon, MD    Brief Narrative:  87 year old female with history of GERD, hypertension, hyperlipidemia brought to the hospital after sustaining fall at home.  She fell and hit her head.  No prodromal symptoms.  She was found to have multiple injuries including right orbital blowout fracture, small intracranial hemorrhage and right wrist fracture.  Seen by neurosurgery, trauma surgery and ophthalmology during hospitalization.  Assessment & Plan:   Active Problems:   Fall   Right orbital fracture, closed, initial encounter ([place])   Head injury  Right orbital blowout fracture/retrobulbar hematoma: Seen by Ophthalmology Dr. Gillian [name] in the emergency room.  Recommended supportive care.  Follow-up with [facility] ophthalmology associates after discharge.  Clinically improving. Patient seen by plastic surgery .  Due to significant orbital floor blowout fracture,  Patient underwent orbital floor reconstruction 5/13.  Surgically stable as per surgery.   Right wrist fracture: discussed with orthopedics.  Recommended nonweightbearing on the wrist, short arm splint, ice compression and mobility. Bilateral subluxation of the patella, chronic.  Work with PT OT.  Minimal punctate traumatic intracranial hemorrhage and subarachnoid hemorrhage with no complication: Seen by neurosurgery.  Conservative management.  Fall/deconditioning: Work with PT OT.  Transfer to rehab when bed available.  Essential hypertension: Normotensive.  Was on nadolol that was stopped due to bradycardia.  Will discontinue.  Patient is medically stable. Discharge to acute inpatient rehab when bed is available.   DVT prophylaxis: SCDs Start: 08/18/20 1429   Code Status: DNR Family Communication: Daughter Sydney [name] at the bedside. Disposition Plan: Status is: Inpatient  Remains inpatient appropriate because:Unsafe d/c plan   Dispo:  The patient is from: Home              Anticipated d/c is to: CIR              Patient currently is medically stable.   Difficult to place patient No         Consultants:   Neurosurgery  Ophthalmology  Plastic/trauma  surgery  Procedures:   None  Antimicrobials:   None   Subjective: Patient seen and examined.  No new events.  Waiting to go to rehab.  Objective: Vitals:   08/25/20 0850 08/25/20 1248 08/25/20 2057 08/26/20 0436  BP: 123/79 123/72 138/82 (!) 144/70  Pulse: 63 96 76 (!) 58  Resp: 15 15 18 16   Temp: 97.9 F (36.6 C) 97.7 F (36.5 C) (!) 97.4 F (36.3 C) 98.1 F (36.7 C)  TempSrc: Oral Oral Oral Oral  SpO2: 95% 97% 96% 96%    Intake/Output Summary (Last 24 hours) at 08/26/2020 1145 Last data filed at 08/26/2020 0700 Gross per 24 hour  Intake 700 ml  Output 1600 ml  Net -900 ml   There were no vitals filed for this visit.  Examination:  General exam: Appears calm and comfortable. Patient has ecchymosis around right orbit, minimal swelling.  Normal vision. Subconjunctival hemorrhage present. Surgical incision looks clean and dry. Respiratory system: Clear to auscultation. Respiratory effort normal. Cardiovascular system: S1 & S2 heard, RRR. No JVD, murmurs, rubs, gallops or clicks. No pedal edema. Gastrointestinal system: Abdomen is nondistended, soft and nontender. No organomegaly or masses felt. Normal bowel sounds heard. Central nervous system: Alert oriented x4.  No focal deficits. Right wrist on splint.     Data Reviewed: I have personally reviewed following labs and imaging studies  CBC: Recent Labs  Lab  08/22/20 0134  WBC 7.4  HGB 11.3*  HCT 34.9*  MCV 94.3  PLT 562*   Basic Metabolic Panel: Recent Labs  Lab 08/22/20 0134  NA 137  K 3.6  CL 105  CO2 25  GLUCOSE 102*  BUN 9  CREATININE 0.74  CALCIUM 8.7*  MG 2.0  PHOS 2.0*   GFR: CrCl cannot be calculated (Unknown ideal weight.). Liver Function  Tests: Recent Labs  Lab 08/22/20 0134  AST 16  ALT 11  ALKPHOS 41  BILITOT 1.3*  PROT 5.4*  ALBUMIN 2.3*   No results for input(s): LIPASE, AMYLASE in the last 168 hours. No results for input(s): AMMONIA in the last 168 hours. Coagulation Profile: No results for input(s): INR, PROTIME in the last 168 hours. Cardiac Enzymes: No results for input(s): CKTOTAL, CKMB, CKMBINDEX, TROPONINI in the last 168 hours. BNP (last 3 results) No results for input(s): PROBNP in the last 8760 hours. HbA1C: No results for input(s): HGBA1C in the last 72 hours. CBG: No results for input(s): GLUCAP in the last 168 hours. Lipid Profile: No results for input(s): CHOL, HDL, LDLCALC, TRIG, CHOLHDL, LDLDIRECT in the last 72 hours. Thyroid Function Tests: No results for input(s): TSH, T4TOTAL, FREET4, T3FREE, THYROIDAB in the last 72 hours. Anemia Panel: No results for input(s): VITAMINB12, FOLATE, FERRITIN, TIBC, IRON, RETICCTPCT in the last 72 hours. Sepsis Labs: No results for input(s): PROCALCITON, LATICACIDVEN in the last 168 hours.  Recent Results (from the past 240 hour(s))  Resp Panel by RT-PCR (Flu A&B, Covid) Nasopharyngeal Swab     Status: None   Collection Time: 08/18/20  9:11 AM   Specimen: Nasopharyngeal Swab; Nasopharyngeal(NP) swabs in vial transport medium  Result Value Ref Range Status   SARS Coronavirus 2 by RT PCR NEGATIVE NEGATIVE Final    Comment: (NOTE) SARS-CoV-2 target nucleic acids are NOT DETECTED.  The SARS-CoV-2 RNA is generally detectable in upper respiratory specimens during the acute phase of infection. The lowest concentration of SARS-CoV-2 viral copies this assay can detect is 138 copies/mL. A negative result does not preclude SARS-Cov-2 infection and should not be used as the sole basis for treatment or other patient management decisions. A negative result may occur with  improper specimen collection/handling, submission of specimen other than nasopharyngeal  swab, presence of viral mutation(s) within the areas targeted by this assay, and inadequate number of viral copies(<138 copies/mL). A negative result must be combined with clinical observations, patient history, and epidemiological information. The expected result is Negative.  Fact Sheet for Patients:  EntrepreneurPulse.com.au  Fact Sheet for Healthcare Providers:  IncredibleEmployment.be  This test is no t yet approved or cleared by the [place] FDA and  has been authorized for detection and/or diagnosis of SARS-CoV-2 by FDA under an Emergency Use Authorization (EUA). This EUA will remain  in effect (meaning this test can be used) for the duration of the COVID-19 declaration under Section 564(b)(1) of the Act, 21 U.S.C.section 360bbb-3(b)(1), unless the authorization is terminated  or revoked sooner.       Influenza A by PCR NEGATIVE NEGATIVE Final   Influenza B by PCR NEGATIVE NEGATIVE Final    Comment: (NOTE) The Xpert Xpress SARS-CoV-2/FLU/RSV plus assay is intended as an aid in the diagnosis of influenza from Nasopharyngeal swab specimens and should not be used as a sole basis for treatment. Nasal washings and aspirates are unacceptable for Xpert Xpress SARS-CoV-2/FLU/RSV testing.  Fact Sheet for Patients: EntrepreneurPulse.com.au  Fact Sheet for Healthcare Providers: IncredibleEmployment.be  This  test is not yet approved or cleared by the [place] and has been authorized for detection and/or diagnosis of SARS-CoV-2 by FDA under an Emergency Use Authorization (EUA). This EUA will remain in effect (meaning this test can be used) for the duration of the COVID-19 declaration under Section 564(b)(1) of the Act, 21 U.S.C. section 360bbb-3(b)(1), unless the authorization is terminated or revoked.  Performed at [facility], [place] [address] [place], [place] [address]   Surgical pcr  screen     Status: None   Collection Time: 08/24/20  8:59 AM   Specimen: Nasal Mucosa; Nasal Swab  Result Value Ref Range Status   MRSA, PCR NEGATIVE NEGATIVE Final   Staphylococcus aureus NEGATIVE NEGATIVE Final    Comment: (NOTE) The Xpert SA Assay (FDA approved for NASAL specimens in patients 67 years of age and older), is one component of a comprehensive surveillance program. It is not intended to diagnose infection nor to guide or monitor treatment. Performed at [facility], [place] [address] [place], [place] [address]          Radiology Studies: No results found.      Scheduled Meds: . diclofenac Sodium  2 g Topical QID  . hydrALAZINE  10 mg Intravenous Once  . pantoprazole  40 mg Oral Daily  . potassium & sodium phosphates  1 packet Oral TID WC & HS  . senna  2 tablet Oral Daily   Continuous Infusions:   LOS: 4 days    Time spent: 25 minutes    Barb Merino, MD [facility] Pager [phone]

## 2020-08-27 ENCOUNTER — Inpatient Hospital Stay (HOSPITAL_COMMUNITY)
Admission: RE | Admit: 2020-08-27 | Discharge: 2020-09-05 | DRG: 945 | Disposition: A | Discharge status: Home Health | Payer: Medicare PPO | Source: Intra-hospital | Attending: Physical Medicine & Rehabilitation | Admitting: Physical Medicine & Rehabilitation

## 2020-08-27 ENCOUNTER — Other Ambulatory Visit: Payer: Self-pay

## 2020-08-27 ENCOUNTER — Encounter (HOSPITAL_COMMUNITY): Payer: Self-pay | Admitting: Plastic Surgery

## 2020-08-27 DIAGNOSIS — E785 Hyperlipidemia, unspecified: Secondary | ICD-10-CM | POA: Diagnosis present

## 2020-08-27 DIAGNOSIS — Z6827 Body mass index (BMI) 27.0-27.9, adult: Secondary | ICD-10-CM

## 2020-08-27 DIAGNOSIS — I129 Hypertensive chronic kidney disease with stage 1 through stage 4 chronic kidney disease, or unspecified chronic kidney disease: Secondary | ICD-10-CM | POA: Diagnosis not present

## 2020-08-27 DIAGNOSIS — G8918 Other acute postprocedural pain: Secondary | ICD-10-CM | POA: Diagnosis not present

## 2020-08-27 DIAGNOSIS — W01190D Fall on same level from slipping, tripping and stumbling with subsequent striking against furniture, subsequent encounter: Secondary | ICD-10-CM | POA: Diagnosis present

## 2020-08-27 DIAGNOSIS — Z83438 Family history of other disorder of lipoprotein metabolism and other lipidemia: Secondary | ICD-10-CM

## 2020-08-27 DIAGNOSIS — Z91040 Latex allergy status: Secondary | ICD-10-CM | POA: Diagnosis not present

## 2020-08-27 DIAGNOSIS — Z833 Family history of diabetes mellitus: Secondary | ICD-10-CM

## 2020-08-27 DIAGNOSIS — Z8249 Family history of ischemic heart disease and other diseases of the circulatory system: Secondary | ICD-10-CM | POA: Diagnosis not present

## 2020-08-27 DIAGNOSIS — K219 Gastro-esophageal reflux disease without esophagitis: Secondary | ICD-10-CM | POA: Diagnosis not present

## 2020-08-27 DIAGNOSIS — K5901 Slow transit constipation: Secondary | ICD-10-CM | POA: Diagnosis not present

## 2020-08-27 DIAGNOSIS — S066X9A Traumatic subarachnoid hemorrhage with loss of consciousness of unspecified duration, initial encounter: Secondary | ICD-10-CM | POA: Diagnosis present

## 2020-08-27 DIAGNOSIS — S066X0A Traumatic subarachnoid hemorrhage without loss of consciousness, initial encounter: Secondary | ICD-10-CM | POA: Diagnosis not present

## 2020-08-27 DIAGNOSIS — S0231XD Fracture of orbital floor, right side, subsequent encounter for fracture with routine healing: Secondary | ICD-10-CM | POA: Diagnosis not present

## 2020-08-27 DIAGNOSIS — E8809 Other disorders of plasma-protein metabolism, not elsewhere classified: Secondary | ICD-10-CM | POA: Diagnosis present

## 2020-08-27 DIAGNOSIS — R001 Bradycardia, unspecified: Secondary | ICD-10-CM | POA: Diagnosis not present

## 2020-08-27 DIAGNOSIS — M353 Polymyalgia rheumatica: Secondary | ICD-10-CM | POA: Diagnosis present

## 2020-08-27 DIAGNOSIS — Z888 Allergy status to other drugs, medicaments and biological substances status: Secondary | ICD-10-CM

## 2020-08-27 DIAGNOSIS — I10 Essential (primary) hypertension: Secondary | ICD-10-CM | POA: Diagnosis not present

## 2020-08-27 DIAGNOSIS — S52501D Unspecified fracture of the lower end of right radius, subsequent encounter for closed fracture with routine healing: Secondary | ICD-10-CM

## 2020-08-27 DIAGNOSIS — Z96643 Presence of artificial hip joint, bilateral: Secondary | ICD-10-CM | POA: Diagnosis present

## 2020-08-27 DIAGNOSIS — E46 Unspecified protein-calorie malnutrition: Secondary | ICD-10-CM | POA: Diagnosis not present

## 2020-08-27 DIAGNOSIS — Z809 Family history of malignant neoplasm, unspecified: Secondary | ICD-10-CM

## 2020-08-27 DIAGNOSIS — S066X0D Traumatic subarachnoid hemorrhage without loss of consciousness, subsequent encounter: Principal | ICD-10-CM

## 2020-08-27 DIAGNOSIS — S066X0S Traumatic subarachnoid hemorrhage without loss of consciousness, sequela: Secondary | ICD-10-CM | POA: Diagnosis not present

## 2020-08-27 DIAGNOSIS — N1831 Chronic kidney disease, stage 3a: Secondary | ICD-10-CM | POA: Diagnosis not present

## 2020-08-27 DIAGNOSIS — S066XAA Traumatic subarachnoid hemorrhage with loss of consciousness status unknown, initial encounter: Secondary | ICD-10-CM | POA: Diagnosis present

## 2020-08-27 MED ORDER — CYCLOBENZAPRINE HCL 5 MG PO TABS
5.0000 mg | ORAL_TABLET | Freq: Three times a day (TID) | ORAL | Status: DC | PRN
  Administered 2020-08-27 – 2020-09-02 (×4): 5 mg via ORAL
  Filled 2020-08-27 (×4): qty 1

## 2020-08-27 MED ORDER — DICLOFENAC SODIUM 1 % EX GEL
2.0000 g | Freq: Four times a day (QID) | CUTANEOUS | Status: DC
  Administered 2020-08-27 – 2020-09-05 (×32): 2 g via TOPICAL
  Filled 2020-08-27: qty 100

## 2020-08-27 MED ORDER — PHENOL 1.4 % MT LIQD: 1.0000 | OROMUCOSAL | 0 refills | Status: DC | PRN

## 2020-08-27 MED ORDER — SENNA 8.6 MG PO TABS: 2.0000 | ORAL_TABLET | Freq: Every day | ORAL | 0 refills | Status: DC

## 2020-08-27 MED ORDER — ACETAMINOPHEN 500 MG PO TABS
500.0000 mg | ORAL_TABLET | Freq: Four times a day (QID) | ORAL | Status: DC | PRN
  Administered 2020-08-27 – 2020-09-04 (×16): 500 mg via ORAL
  Filled 2020-08-27 (×16): qty 1

## 2020-08-27 MED ORDER — POTASSIUM & SODIUM PHOSPHATES 280-160-250 MG PO PACK
1.0000 | PACK | Freq: Three times a day (TID) | ORAL | Status: DC
  Administered 2020-08-27 – 2020-09-05 (×34): 1 via ORAL
  Filled 2020-08-27 (×36): qty 1

## 2020-08-27 MED ORDER — BISACODYL 5 MG PO TBEC: 5.0000 mg | DELAYED_RELEASE_TABLET | Freq: Every day | ORAL | Status: DC | PRN

## 2020-08-27 MED ORDER — POTASSIUM & SODIUM PHOSPHATES 280-160-250 MG PO PACK: 1.0000 | PACK | Freq: Three times a day (TID) | ORAL | Status: DC

## 2020-08-27 MED ORDER — ONDANSETRON HCL 4 MG/2ML IJ SOLN: 4.0000 mg | Freq: Four times a day (QID) | INTRAMUSCULAR | Status: DC | PRN

## 2020-08-27 MED ORDER — PANTOPRAZOLE SODIUM 40 MG PO TBEC
40.0000 mg | DELAYED_RELEASE_TABLET | Freq: Every day | ORAL | Status: DC
  Administered 2020-08-27 – 2020-09-05 (×10): 40 mg via ORAL
  Filled 2020-08-27 (×10): qty 1

## 2020-08-27 MED ORDER — ONDANSETRON HCL 4 MG PO TABS
4.0000 mg | ORAL_TABLET | Freq: Four times a day (QID) | ORAL | Status: DC | PRN
  Administered 2020-09-02: 4 mg via ORAL
  Filled 2020-08-27: qty 1

## 2020-08-27 MED ORDER — OXYCODONE HCL 5 MG PO TABS
5.0000 mg | ORAL_TABLET | ORAL | Status: DC | PRN
  Administered 2020-08-29 – 2020-09-02 (×2): 5 mg via ORAL
  Filled 2020-08-27 (×2): qty 1

## 2020-08-27 MED ORDER — SENNA 8.6 MG PO TABS
2.0000 | ORAL_TABLET | Freq: Every day | ORAL | Status: DC
  Administered 2020-08-28 – 2020-09-04 (×8): 17.2 mg via ORAL
  Filled 2020-08-27 (×9): qty 2

## 2020-08-27 NOTE — Progress Notes (Signed)
[name], MD  Physician  Physical Medicine and Rehabilitation  Consult Note      Signed  Date of Service:  08/21/2020  5:33 AM      Related encounter: ED to Hosp-Admission (Discharged) from 08/18/2020 in [place]       Signed      Expand All Collapse All     Show:Clear all [x] Manual[x] Template[] Copied  Added by: [x] Angiulli, Lavon Paganini, PA-C[x] [name], MD   [] Hover for details           Physical Medicine and Rehabilitation Consult Reason for Consult: Altered mental status with decreased functional mobility Referring Physician: Dr.Pokhrel     HPI: Sydney [name] is a 87 y.o. right-handed female with history of hypertension, hyperlipidemia, PMR, iliac artery aneurysm, bilateral hip replacements.  Per chart review patient lives alone.  Two-level home bed and bath main level with 3 steps to entry.  Independent with a walker.  She has a son and granddaughter locally can assist as needed.  She has a daughter from [place] who will be assisting for a short time on discharge.  Presented 08/18/2020 after mechanical fall while walking to the bathroom when she slipped and struck her head on a dresser.  Denied loss of consciousness.  Denied any chest pain or shortness of breath associated with the fall.  Cranial CT scan showed small amount of subarachnoid hemorrhage of the anterior left frontal lobe.  No other acute intracranial abnormality.  No skull fracture.  Patient sustained comminuted depressed right orbital floor fracture with fracture also involving the adjacent anterior inferior right medial orbital wall as well as right side exophthalmos.  CT cervical spine negative for fracture.  X-rays of right wrist showed subtle nondisplaced fracture of the distal radius metaphysis with an intra-articular component.  No dislocation.  Admission chemistries unremarkable except glucose 135.  Neurosurgery follow-up for small traumatic SAH/ICH Dr. Christella Noa no  surgical intervention and monitor.  Follow-up plastic surgery Dr. Claudia Desanctis in regards to right orbital floor fracture and currently awaiting swelling to dissipate and consider surgical intervention which could be addressed as outpatient.  Conservative care of right nondisplaced fracture of distal radius nonweightbearing with cock up splint and no current surgical intervention.  Therapy evaluations completed due to patient decreased functional mobility recommendations of physical medicine rehab consult.   Pt reports her LBM was least 5 days ago, maybe before and feels VERY constipated. Using Purewick, but urine dark amber in afternoon- admits not drinking well.  Has control of urine, just makes it easier to use Purewick.  Also found to have T of 100.1 when I was in room- educated on how to use ICS. Rates pain 4/10 right now- 7/10 when meds wear off at rest. Meds working well.      Review of Systems  Constitutional: Negative for chills and fever.  HENT: Negative for hearing loss.   Eyes: Positive for pain. Negative for blurred vision and double vision.  Respiratory: Negative for cough and shortness of breath.   Cardiovascular: Negative for chest pain, palpitations and leg swelling.  Gastrointestinal: Positive for constipation. Negative for heartburn, nausea and vomiting.       GERD  Genitourinary: Negative for dysuria, flank pain and hematuria.  Musculoskeletal: Positive for back pain, joint pain and myalgias.  Skin: Negative for rash.  Neurological: Positive for weakness.  All other systems reviewed and are negative.       Past Medical History:  Diagnosis Date  .  GERD (gastroesophageal reflux disease)    . Hx of lumbosacral spine surgery t  . Hyperlipidemia    . Hypertension    . Iliac artery aneurysm ([place])    . Lumbar spinal stenosis    . Polymyalgia rheumatica ([place]) 06/2016         Past Surgical History:  Procedure Laterality Date  . JOINT REPLACEMENT   1992    right hip replacement   . JOINT REPLACEMENT   2002    left hip replacement  . ROTATOR CUFF REPAIR   2002    Left shoulder  . SPINE SURGERY   2006 & 2010    posterior spinal fusion L4-5 L5S1         Family History  Problem Relation Age of Onset  . Heart disease Mother    . Heart disease Father          Heart Disease before age 40  . Heart attack Father    . Hyperlipidemia Father    . Heart disease Sister    . Diabetes Sister    . Heart disease Brother    . Hyperlipidemia Brother    . Heart disease Brother    . Cancer Brother      Social History:  reports that she has never smoked. She has never used smokeless tobacco. She reports that she does not drink alcohol and does not use drugs. Allergies:       Allergies  Allergen Reactions  . Lipitor [Atorvastatin]        Dizzy ( fuzziness)  . Latex Rash          Medications Prior to Admission  Medication Sig Dispense Refill  . acetaminophen (TYLENOL) 500 MG tablet Take 500 mg by mouth as needed for mild pain.       Marland Kitchen aspirin EC 81 MG tablet Take 81 mg by mouth daily. Swallow whole.      . furosemide (LASIX) 20 MG tablet Take 2 tablets (40 mg total) by mouth daily as needed. For morning leg swelling (Patient taking differently: Take 40 mg by mouth every other day.) 1 tablet 0  . nadolol (CORGARD) 40 MG tablet TAKE 1 TABLET BY MOUTH EVERY DAY (Patient taking differently: Take 40 mg by mouth daily.) 90 tablet 3  . pantoprazole (PROTONIX) 40 MG tablet Take 1 tablet (40 mg total) by mouth daily. 90 tablet 3  . calcium carbonate (OS-CAL - DOSED IN MG OF ELEMENTAL CALCIUM) 1250 (500 Ca) MG tablet Take 1 tablet (500 mg of elemental calcium total) by mouth 2 (two) times daily with a meal. 60 tablet 0      Home: Home Living Family/patient expects to be discharged to:: Private residence Living Arrangements: Alone,Other (Comment) (daughter will be staying with her at discharge (daughter lives in [place])) Type of Home: House Home Access: Stairs to enter Engineer, site of Steps: 3 Entrance Stairs-Rails: Left Home Layout: Able to live on main level with bedroom/bathroom Bathroom Shower/Tub: Walk-in shower,Other (comment) (walk in tub) Bathroom Toilet: Handicapped height Home Equipment: [place] - single point,Walker - 4 wheels,Shower [name] Additional Comments: Pt's spouse died in Apr 26, 2022.  She has a son and grand daughter locally who assist as needed.  Functional History: Prior Function Level of Independence: Independent with assistive device(s) Comments: uses a rollator in the house and a cane in the community.  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  Since the onset of the pandemic, she stopped driving and going  out in the community.  She has a local son and grand daughter who provide transportation and get her groceries. Functional Status:  Mobility: Bed Mobility Overal bed mobility: Needs Assistance Bed Mobility: Supine to Sit Supine to sit: Mod assist Sit to supine: Mod assist General bed mobility comments: Transferring to [facility] on entry Transfers Overall transfer level: Needs assistance Equipment used: Rolling walker (2 wheeled) Transfers: Sit to/from [employer] Sit to Stand: Mod assist,+2 physical assistance Stand pivot transfers: Mod assist,+2 physical assistance General transfer comment: Verbal cues for hand placement, assistance to boost into standing and assistance to maintain standing.  Performed stand pivot to commode.  From commode stood and had Commode removed and chair placed behind the patient. Ambulation/Gait Ambulation/Gait assistance:  (Unable this session due to R wrist and R leg pain.) Gait Distance (Feet): 40 Feet (20+20) Assistive device: Rolling walker (2 wheeled) Gait Pattern/deviations: Step-to pattern General Gait Details: very painful R hip and groin in stance; used RW to help unweigh painful RLE in stance; steps lenght becomes significantly shorter with incr pain and  fatigue Gait velocity: quite slow Stairs: Yes Stairs assistance: Mod assist Stair Management: One rail Left,Step to pattern,Forwards Number of Stairs: 2 General stair comments: Attempted to go up with one or both hands on L rail, however, pt with too much pain in R groin while stepping LLE up; tehn used L hand on rail and RUE over the shoulder assist from PT; able to go up 2 steps in this fashion; pt exhausted and requested to stop at 2 steps   ADL: ADL Overall ADL's : Needs assistance/impaired Eating/Feeding: Set up,Sitting Eating/Feeding Details (indicate cue type and reason): Pt able to bring cup to her mouth and drink Grooming: Wash/dry face,Wash/dry hands,Set up,Sitting Grooming Details (indicate cue type and reason): completed in recliner Upper Body Bathing: Minimal assistance,Sitting Lower Body Bathing: Moderate assistance,Sit to/from stand Upper Body Dressing : Minimal assistance,Sitting Lower Body Dressing: Moderate assistance,Sit to/from stand Lower Body Dressing Details (indicate cue type and reason): Pt able to don/doff Lt sock, but unable to don/doff Rt sock due to Rt groin pain Toilet Transfer: Minimal assistance,Stand-pivot,BSC Toilet Transfer Details (indicate cue type and reason): Pt was in a lot of pain requiring assistance to stand and pivot to [employer] Toileting- Clothing Manipulation and Hygiene: Min guard,Sitting/lateral lean Toileting - Clothing Manipulation Details (indicate cue type and reason): Pt able to complete toileting hygiene, needed min guard for safety Functional mobility during ADLs: Minimal assistance,Rolling walker General ADL Comments: Pt unable to ambulate this session, she stood and pivoted to [facility] then stood again while the [facility] was replaced with the recliner. Functional mobility was limited due to pain.   Cognition: Cognition Overall Cognitive Status: Impaired/Different from baseline Cognition Arousal/Alertness: Lethargic Behavior During Therapy:  Flat affect Overall Cognitive Status: Impaired/Different from baseline Area of Impairment: Following commands,Safety/judgement,Problem solving Following Commands: Follows one step commands with increased time Safety/Judgement: Decreased awareness of safety,Decreased awareness of deficits Problem Solving: Slow processing,Difficulty sequencing,Requires verbal cues,Requires tactile cues General Comments: Pt slow to respond and remains internally distracted by her pain.   Blood pressure (!) 115/56, pulse 81, temperature 98.4 F (36.9 C), temperature source Oral, resp. rate 18, SpO2 94 %. Physical Exam Vitals and nursing note reviewed. Exam conducted with a chaperone present.  Constitutional:      Comments: Pt supine in bed- elderly women, daughter at bedside; appropriate, NAD; has Purewick  HENT:     Head: Normocephalic.     Comments: She has  significant swelling and bruising around the right eye No exophthalmos noted in R eye- at this time    Right Ear: External ear normal.     Left Ear: External ear normal.     Nose: Nose normal. No congestion.     Mouth/Throat:     Mouth: Mucous membranes are dry.     Pharynx: Oropharynx is clear. No oropharyngeal exudate.  Eyes:     Extraocular Movements: Extraocular movements intact.     Comments: R eye very bruised, swollen around eye, but no exophthalmos noted- L eye looks normal  Cardiovascular:     Heart sounds: Normal heart sounds. No murmur heard. No gallop.      Comments: RRR- no JVD- rate in low 60s when seen Pulmonary:     Comments: CTA B/L- no W/R/R- good air movement Abdominal:     Comments: Soft, but NT, distended; normoactive BS  Genitourinary:    Comments: purewick- container holds dark amber urine- admits drinking 2 cups of water/day today Musculoskeletal:     Cervical back: Normal range of motion and neck supple.     Comments: LUE 5/5 in B/T/WE/G and FA RUE- can wiggle fingers and shoulder shrug, but notes any shoulder/elbow  movement painful in spite of R wrist brace LLE- 5/5 in HF,KE, DF and PF RLE- HF and KE 2-/5 due to pain; DF and PF 5-/5   Skin:    General: Skin is warm and dry.     Comments: No skin breakdown seen IV in UE looks OK  Neurological:     Comments: Patient is alert in no acute distress.  Makes eye contact with examiner.  Follows simple commands.  Provides name and age. Intact to light touch in all 4 extremities Ox3  Psychiatric:        Mood and Affect: Mood normal.        Behavior: Behavior normal.        Lab Results Last 24 Hours  No results found for this or any previous visit (from the past 24 hour(s)).   Imaging Results (Last 48 hours)  No results found.       Assessment/Plan: Diagnosis: R wrist fx and R orbital fx as well as SAH s/p mechanical fall 1. Does the need for close, 24 hr/day medical supervision in concert with the patient's rehab needs make it unreasonable for this patient to be served in a less intensive setting? Yes 2. Co-Morbidities requiring supervision/potential complications: polymyalgia rheumatica; SAH/ICH/R wrist fx NWB on R wrist/WB at elbow; severe LE pain; obrital fx- surgery Friday?; severe constipation 3. Due to bladder management, bowel management, safety, skin/wound care, disease management, medication administration, pain management and patient education, does the patient require 24 hr/day rehab nursing? Yes 4. Does the patient require coordinated care of a physician, rehab nurse, therapy disciplines of PT and OT to address physical and functional deficits in the context of the above medical diagnosis(es)? Yes Addressing deficits in the following areas: balance, endurance, locomotion, strength, transferring, bowel/bladder control, bathing, dressing, feeding, grooming and toileting 5. Can the patient actively participate in an intensive therapy program of at least 3 hrs of therapy per day at least 5 days per week? Yes 6. The potential for patient to make  measurable gains while on inpatient rehab is good 7. Anticipated functional outcomes upon discharge from inpatient rehab are supervision and min assist  with PT, supervision and min assist with OT, n/a with SLP. 8. Estimated rehab length of stay to reach  the above functional goals is: 14-18 days 9. Anticipated discharge destination: Home 10. Overall Rehab/Functional Prognosis: good   RECOMMENDATIONS: This patient's condition is appropriate for continued rehabilitative care in the following setting: CIR Patient has agreed to participate in recommended program. Potentially Note that insurance prior authorization may be required for reimbursement for recommended care.   Comment:  1. Added Sorbitol 30 ml x1 this evening due ot lack of BM in 5-7 days 2. Also added Senokot 2 tabs daily- might need to increase dose.  3. Drink 6 cups of water daily, minimum 4. Also ordered and got at bedside an ICS- since has low grade temp. Might need additional w/u if temperature gets higher.  5. Pt is appropriate candidate at this time for inpt Rehab, however might need R eye surgery on Friday - per plastics- will see how she does over next few days.  6. Will submit for CIR admissions coordinators to get pt inpt CIR after surgery 7. Thank you for this consult     Cathlyn Parsons, PA-C 08/21/2020      I have personally performed a face to face diagnostic evaluation of this patient and formulated the key components of the plan.  Additionally, I have personally reviewed laboratory data, imaging studies, as well as relevant notes and concur with the physician assistant's documentation above.                Revision History         Routing History                   Note Details  Jan Fireman, MD File Time 08/21/2020  6:37 PM  Author Type Physician Status Signed  Last Editor [name], MD Service Physical Medicine and [place] # [id number] Admit Date 08/27/2020

## 2020-08-27 NOTE — Progress Notes (Signed)
PROGRESS NOTE    Sydney [name]  PJA:[id number] DOB: 02-10-1933 DOA: 08/18/2020 PCP: Venia Carbon, MD    Brief Narrative:  87 year old female with history of GERD, hypertension, hyperlipidemia brought to the hospital after sustaining fall at home.  She fell and hit her head.  No prodromal symptoms.  She was found to have multiple injuries including right orbital blowout fracture, small intracranial hemorrhage and right wrist fracture.  Seen by neurosurgery, trauma surgery and ophthalmology during hospitalization.  Medically stable.  Waiting for rehab bed.  Assessment & Plan:   Active Problems:   Fall   Right orbital fracture, closed, initial encounter ([place])   Head injury  Right orbital blowout fracture/retrobulbar hematoma: Seen by Ophthalmology Dr. Gillian Scarce in the emergency room.  Recommended supportive care.  Follow-up with [facility] ophthal[facility] after discharge.  Clinically improving. Patient seen by plastic surgery .  Due to significant orbital floor blowout fracture,  Patient underwent orbital floor reconstruction 5/13.  Surgically stable as per surgery.   Right wrist fracture: discussed with orthopedics.  Recommended nonweightbearing on the wrist, short arm splint, ice compression and mobility. Bilateral subluxation of the patella, chronic.  Work with PT OT.  Minimal punctate traumatic intracranial hemorrhage and subarachnoid hemorrhage with no complication: Seen by neurosurgery.  Conservative management.  Fall/deconditioning: Work with PT OT.  Transfer to rehab when bed available.  Essential hypertension: Normotensive.  Was on nadolol that was stopped due to bradycardia.  Will discontinue.  Patient is medically stable. Discharge to acute inpatient rehab when bed is available.   DVT prophylaxis: SCDs Start: 08/18/20 1429   Code Status: DNR Family Communication: Daughter [name] at the bedside. Disposition Plan: Status is: Inpatient  Remains inpatient  appropriate because:Unsafe d/c plan   Dispo: The patient is from: Home              Anticipated d/c is to: CIR              Patient currently is medically stable.   Difficult to place patient No         Consultants:   Neurosurgery  Ophthalmology  Plastic/trauma  surgery  Procedures:   None  Antimicrobials:   None   Subjective: Seen and examined.  Daughter at bedside.  No new events.  Pain is well controlled.  No vision problem.  Objective: Vitals:   08/26/20 0436 08/26/20 1446 08/26/20 2103 08/27/20 0437  BP: (!) 144/70 134/79 139/88 (!) 147/84  Pulse: (!) 58 89 88 70  Resp: 16 20 17 17   Temp: 98.1 F (36.7 C) 97.7 F (36.5 C) 97.8 F (36.6 C) 98.1 F (36.7 C)  TempSrc: Oral Oral Oral Oral  SpO2: 96% 93% 97% 94%    Intake/Output Summary (Last 24 hours) at 08/27/2020 1015 Last data filed at 08/27/2020 0439 Gross per 24 hour  Intake 560 ml  Output 1650 ml  Net -1090 ml   There were no vitals filed for this visit.  Examination:  General exam: Appears calm and comfortable. Patient has ecchymosis around right orbit, minimal swelling.  Normal vision. Subconjunctival hemorrhage present, clearing up. Surgical incision looks clean and dry. Respiratory system: Clear to auscultation. Respiratory effort normal. Cardiovascular system: S1 & S2 heard, RRR. No JVD, murmurs, rubs, gallops or clicks. No pedal edema. Gastrointestinal system: Abdomen is nondistended, soft and nontender. No organomegaly or masses felt. Normal bowel sounds heard. Central nervous system: Alert oriented x4.  No focal deficits. Right wrist on splint.     Data  Reviewed: I have personally reviewed following labs and imaging studies  CBC: Recent Labs  Lab 08/22/20 0134  WBC 7.4  HGB 11.3*  HCT 34.9*  MCV 94.3  PLT 161*   Basic Metabolic Panel: Recent Labs  Lab 08/22/20 0134  NA 137  K 3.6  CL 105  CO2 25  GLUCOSE 102*  BUN 9  CREATININE 0.74  CALCIUM 8.7*  MG 2.0   PHOS 2.0*   GFR: CrCl cannot be calculated (Unknown ideal weight.). Liver Function Tests: Recent Labs  Lab 08/22/20 0134  AST 16  ALT 11  ALKPHOS 41  BILITOT 1.3*  PROT 5.4*  ALBUMIN 2.3*   No results for input(s): LIPASE, AMYLASE in the last 168 hours. No results for input(s): AMMONIA in the last 168 hours. Coagulation Profile: No results for input(s): INR, PROTIME in the last 168 hours. Cardiac Enzymes: No results for input(s): CKTOTAL, CKMB, CKMBINDEX, TROPONINI in the last 168 hours. BNP (last 3 results) No results for input(s): PROBNP in the last 8760 hours. HbA1C: No results for input(s): HGBA1C in the last 72 hours. CBG: No results for input(s): GLUCAP in the last 168 hours. Lipid Profile: No results for input(s): CHOL, HDL, LDLCALC, TRIG, CHOLHDL, LDLDIRECT in the last 72 hours. Thyroid Function Tests: No results for input(s): TSH, T4TOTAL, FREET4, T3FREE, THYROIDAB in the last 72 hours. Anemia Panel: No results for input(s): VITAMINB12, FOLATE, FERRITIN, TIBC, IRON, RETICCTPCT in the last 72 hours. Sepsis Labs: No results for input(s): PROCALCITON, LATICACIDVEN in the last 168 hours.  Recent Results (from the past 240 hour(s))  Resp Panel by RT-PCR (Flu A&B, Covid) Nasopharyngeal Swab     Status: None   Collection Time: 08/18/20  9:11 AM   Specimen: Nasopharyngeal Swab; Nasopharyngeal(NP) swabs in vial transport medium  Result Value Ref Range Status   SARS Coronavirus 2 by RT PCR NEGATIVE NEGATIVE Final    Comment: (NOTE) SARS-CoV-2 target nucleic acids are NOT DETECTED.  The SARS-CoV-2 RNA is generally detectable in upper respiratory specimens during the acute phase of infection. The lowest concentration of SARS-CoV-2 viral copies this assay can detect is 138 copies/mL. A negative result does not preclude SARS-Cov-2 infection and should not be used as the sole basis for treatment or other patient management decisions. A negative result may occur with   improper specimen collection/handling, submission of specimen other than nasopharyngeal swab, presence of viral mutation(s) within the areas targeted by this assay, and inadequate number of viral copies(<138 copies/mL). A negative result must be combined with clinical observations, patient history, and epidemiological information. The expected result is Negative.  Fact Sheet for Patients:  EntrepreneurPulse.com.au  Fact Sheet for Healthcare Providers:  IncredibleEmployment.be  This test is no t yet approved or cleared by the [place] FDA and  has been authorized for detection and/or diagnosis of SARS-CoV-2 by FDA under an Emergency Use Authorization (EUA). This EUA will remain  in effect (meaning this test can be used) for the duration of the COVID-19 declaration under Section 564(b)(1) of the Act, 21 U.S.C.section 360bbb-3(b)(1), unless the authorization is terminated  or revoked sooner.       Influenza A by PCR NEGATIVE NEGATIVE Final   Influenza B by PCR NEGATIVE NEGATIVE Final    Comment: (NOTE) The Xpert Xpress SARS-CoV-2/FLU/RSV plus assay is intended as an aid in the diagnosis of influenza from Nasopharyngeal swab specimens and should not be used as a sole basis for treatment. Nasal washings and aspirates are unacceptable for Xpert Xpress SARS-CoV-2/FLU/RSV  testing.  Fact Sheet for Patients: EntrepreneurPulse.com.au  Fact Sheet for Healthcare Providers: IncredibleEmployment.be  This test is not yet approved or cleared by the [place] FDA and has been authorized for detection and/or diagnosis of SARS-CoV-2 by FDA under an Emergency Use Authorization (EUA). This EUA will remain in effect (meaning this test can be used) for the duration of the COVID-19 declaration under Section 564(b)(1) of the Act, 21 U.S.C. section 360bbb-3(b)(1), unless the authorization is terminated  or revoked.  Performed at [facility], [place] [address], [place], [place] [address]   Surgical pcr screen     Status: None   Collection Time: 08/24/20  8:59 AM   Specimen: Nasal Mucosa; Nasal Swab  Result Value Ref Range Status   MRSA, PCR NEGATIVE NEGATIVE Final   Staphylococcus aureus NEGATIVE NEGATIVE Final    Comment: (NOTE) The Xpert SA Assay (FDA approved for NASAL specimens in patients 6 years of age and older), is one component of a comprehensive surveillance program. It is not intended to diagnose infection nor to guide or monitor treatment. Performed at [facility], [place] [address] [place], [place] [address]          Radiology Studies: No results found.      Scheduled Meds: . diclofenac Sodium  2 g Topical QID  . hydrALAZINE  10 mg Intravenous Once  . pantoprazole  40 mg Oral Daily  . potassium & sodium phosphates  1 packet Oral TID WC & HS  . senna  2 tablet Oral Daily   Continuous Infusions:   LOS: 5 days    Time spent: 25 minutes    Barb Merino, MD [facility] Pager [phone]

## 2020-08-27 NOTE — IPOC Note (Signed)
Individualized overall Plan of Care (IPOC) Patient Details Name: Sydney [name] MRN: [id number] DOB: Mar 27, 1933  Admitting Diagnosis: Traumatic subarachnoid hemorrhage [facility])  Hospital Problems: Principal Problem:   Traumatic subarachnoid hemorrhage ([place]) Active Problems:   Hypoalbuminemia due to protein-calorie malnutrition (HCC)   Slow transit constipation   Essential hypertension   Closed fracture of lower end of right radius with routine healing   Postoperative pain     Functional Problem List: Nursing Bladder,Bowel,Pain,Edema,Safety,Medication Management,Endurance  PT Balance,Edema,Endurance,Motor,Pain,Safety  OT Balance,Endurance,Pain  SLP    TR         Basic ADL's: OT Bathing,Dressing,Toileting,Grooming     Advanced  ADL's: OT Simple Meal Preparation,Light Housekeeping     Transfers: PT Bed Mobility,Bed to [employer]  OT Toilet,Tub/Shower     Locomotion: PT Ambulation     Additional Impairments: OT None  SLP        TR      Anticipated Outcomes Item Anticipated Outcome  Self Feeding independent  Swallowing      Basic self-care  Mod independent  Toileting  Mod I   Bathroom Transfers MOd I  Bowel/Bladder  manage bowel and bladder with mod I assist  Transfers  mod I with LRAD  Locomotion  mod I with LRAD  Communication     Cognition     Pain  Pain at or below level 4  Safety/Judgment  manage safety with cues/reminders   Therapy Plan: PT Intensity: Minimum of 1-2 x/day ,45 to 90 minutes PT Frequency: 5 out of 7 days PT Duration Estimated Length of Stay: 10 days OT Intensity: Minimum of 1-2 x/day, 45 to 90 minutes OT Frequency: 5 out of 7 days OT Duration/Estimated Length of Stay: 10 days      Team Interventions: Nursing Interventions Patient/Family Education,Pain Management,Medication Management,Disease Management/Prevention,Bladder Experiment  PT interventions Ambulation/gait training,Discharge  planning,Functional mobility training,Psychosocial support,Visual/perceptual remediation/compensation,Therapeutic Activities,Wheelchair propulsion/positioning,Therapeutic Exercise,Skin care/wound management,Neuromuscular re-education,Disease Environmental consultant training,Cognitive remediation/compensation,DME/adaptive equipment instruction,Pain management,Splinting/orthotics,UE/LE Strength taining/ROM,UE/LE Coordination activities,Stair training,Patient/family education,Community reintegration  OT Interventions Balance/vestibular training,Discharge planning,DME/adaptive equipment instruction,Functional mobility training,Therapeutic Activities,UE/LE Strength taining/ROM,Patient/family education,Self Care/advanced ADL retraining,Therapeutic Exercise  SLP Interventions    TR Interventions    SW/CM Interventions Discharge Planning,Psychosocial Support,Patient/Family Education   Barriers to Discharge MD  Medical stability and Weight bearing restrictions  Nursing Decreased caregiver support,Home environment access/layout,Weight bearing restrictions 2 level 3 STE; B+B on main; Dtr from [facility] to assist temporarily as spouse died in May 14, 2022  PT Decreased caregiver support,Lack of/limited family support,Insurance for SNF coverage,Weight bearing restrictions    OT Decreased caregiver support pt states she will be home alone most of the time; family helps with errands  SLP      SW       Team Discharge Planning: Destination: PT-Home ,OT- Home , SLP-  Projected Follow-up: PT-Home health PT, OT-  Home health OT, SLP-  Projected Equipment Needs: PT-To be determined, OT- To be determined, SLP-  Equipment Details: PT-Will likely need PFRW, OT-  Patient/family involved in discharge planning: PT- Patient,Family member/caregiver,  OT-Patient,Family member/caregiver, SLP-   MD ELOS: 8-10 days. Medical Rehab Prognosis:  Excellent Assessment: 87 year old right-handed female with history of  hypertension, hyperlipidemia, PMR, iliac artery aneurysm, bilateral hip replacements.  Per chart review lives alone.  Two-level home bed and bath main level 3 steps to entry.  Independent with a walker.  She has a son and granddaughter locally who can assist as needed.  She has a daughter also in [place] who will be assisting for a short time  on discharge.  Presented 08/18/2020 after mechanical fall while walking to the bathroom when she slipped and struck her head on a dresser.  Denied loss of consciousness.  Denied any chest pain or shortness of breath associated with the fall.  Cranial CT scan showed small amount of subarachnoid hemorrhage of the anterior left frontal lobe.  No other acute intracranial abnormality.  No skull fracture.  Patient sustained comminuted depressed right orbital floor fracture with fracture also involving the adjacent anterior inferior right medial orbital wall as well as right side exophthalmos.  CT cervical spine negative for fracture.  Patient sustained right wrist subtle nondisplaced fracture of the distal radial metaphysis with an intra-articular component.  No dislocation.  Admission chemistries unremarkable aside glucose 135.  Neurosurgery follow-up for small traumatic SAH/ICH Dr. Christella Noa no surgical intervention and monitored.  Follow-up plastic surgery Dr. Claudia Desanctis in regards to right orbital floor fracture and underwent reconstruction of orbital floor 08/24/2020 per Dr. Claudia Desanctis.  Advised conservative care of right nondisplaced fracture of distal radius nonweightbearing with cock up splint and no current surgical intervention.  Patient with resulting functional deficits with mobility, transfers, self-care.  Will set goals for Mod I with PT/OT.  Due to the current state of emergency, patients may not be receiving their 3-hours of Medicare-mandated therapy.  See Team Conference Notes for weekly updates to the plan of care

## 2020-08-27 NOTE — Care Management Important Message (Signed)
Important Message  Patient Details  Name: Sydney [name] MRN: [id number] Date of Birth: [birth year]   Medicare Important Message Given:  Yes     Sydney [name] Circle 08/27/2020, 3:12 PM

## 2020-08-27 NOTE — Progress Notes (Signed)
Inpatient Rehabilitation Medication Review by a Pharmacist  A complete drug regimen review was completed for this patient to identify any potential clinically significant medication issues.  Clinically significant medication issues were identified:  yes   Type of Medication Issue Identified Description of Issue Urgent (address now) Non-Urgent (address on AM team rounds) Plan Plan Accepted by Provider? (Yes / No / Pending AM Rounds)  Drug Interaction(s) (clinically significant)       Duplicate Therapy       Allergy       No Medication Administration End Date       Incorrect Dose       Additional Drug Therapy Needed  Furosemide and calcium were to be continued at discharge but not addressed on rehab med rec Non-urgent Restart furosemide 40mg  prn AM swelling Restart Ca 1250mg  (500mg  elemental) BID Pending AM rounds  Other         Name of provider notified for urgent issues identified: n/a  Provider Method of Notification: n/a   For non-urgent medication issues to be resolved on team rounds tomorrow morning a [facility] Secure Chat Handoff was sent to: [facility]   Pharmacist comments:   Time spent performing this drug regimen review (minutes):  10 minutes   Lolita Rieger 08/27/2020 6:34 PM

## 2020-08-27 NOTE — H&P (Signed)
Physical Medicine and Rehabilitation Admission H&P  CC: SAH  HPI: Sydney [name] is an 87 year old right-handed female with history of hypertension, hyperlipidemia, PMR, iliac artery aneurysm, bilateral hip replacements.  Per chart review lives alone.  Two-level home bed and bath main level 3 steps to entry.  Independent with a walker.  She has a son and granddaughter locally who can assist as needed.  She has a daughter also in [place] who will be assisting for a short time on discharge.  Presented 08/18/2020 after mechanical fall while walking to the bathroom when she slipped and struck her head on a dresser.  Denied loss of consciousness.  Denied any chest pain or shortness of breath associated with the fall.  Cranial CT scan showed small amount of subarachnoid hemorrhage of the anterior left frontal lobe.  No other acute intracranial abnormality.  No skull fracture.  Patient sustained comminuted depressed right orbital floor fracture with fracture also involving the adjacent anterior inferior right medial orbital wall as well as right side exophthalmos.  CT cervical spine negative for fracture.  Patient sustained right wrist subtle nondisplaced fracture of the distal radial metaphysis with an intra-articular component.  No dislocation.  Admission chemistries unremarkable aside glucose 135.  Neurosurgery follow-up for small traumatic SAH/ICH Dr. Christella Noa no surgical intervention and monitored.  Follow-up plastic surgery Dr. Claudia Desanctis in regards to right orbital floor fracture and underwent reconstruction of orbital floor 08/24/2020 per Dr. Claudia Desanctis.  Advised conservative care of right nondisplaced fracture of distal radius nonweightbearing with cock up splint and no current surgical intervention.  Therapy evaluations completed due to patient decreased functional mobility was admitted for a comprehensive rehab program. Denies pain.   Review of Systems  Constitutional: Negative for chills.  HENT: Negative for  hearing loss.   Eyes: Negative for blurred vision and double vision.  Respiratory: Negative for cough and shortness of breath.   Cardiovascular: Negative for chest pain, palpitations and leg swelling.  Gastrointestinal: Positive for constipation. Negative for heartburn, nausea and vomiting.       GERD  Genitourinary: Negative for dysuria, flank pain and hematuria.  Musculoskeletal: Positive for back pain, joint pain and myalgias.  Skin: Negative for rash.  Neurological: Positive for weakness.  All other systems reviewed and are negative.  Past Medical History:  Diagnosis Date  . GERD (gastroesophageal reflux disease)   . Hx of lumbosacral spine surgery t  . Hyperlipidemia   . Hypertension   . Iliac artery aneurysm ([place])   . Lumbar spinal stenosis   . Polymyalgia rheumatica ([place]) 06/2016   Past Surgical History:  Procedure Laterality Date  . JOINT REPLACEMENT  1992   right hip replacement  . JOINT REPLACEMENT  2002   left hip replacement  . ORIF ORBITAL FRACTURE Right 08/24/2020   Procedure: open reduction internal fixation of right orbital floor fracture;  Surgeon: Cindra Presume, MD;  Location: [facility];  Service: Plastics;  Laterality: Right;  . ROTATOR CUFF REPAIR  2002   Left shoulder  . SPINE SURGERY  2006 & 2010   posterior spinal fusion L4-5 L5S1   Family History  Problem Relation Age of Onset  . Heart disease Mother   . Heart disease Father        Heart Disease before age 39  . Heart attack Father   . Hyperlipidemia Father   . Heart disease Sister   . Diabetes Sister   . Heart disease Brother   . Hyperlipidemia Brother   .  Heart disease Brother   . Cancer Brother    Social History:  reports that she has never smoked. She has never used smokeless tobacco. She reports that she does not drink alcohol and does not use drugs. Allergies:  Allergies  Allergen Reactions  . Lipitor [Atorvastatin]     Dizzy ( fuzziness)  . Latex Rash   Medications Prior to  Admission  Medication Sig Dispense Refill  . acetaminophen (TYLENOL) 500 MG tablet Take 500 mg by mouth as needed for mild pain.     . calcium carbonate (OS-CAL - DOSED IN MG OF ELEMENTAL CALCIUM) 1250 (500 Ca) MG tablet Take 1 tablet (500 mg of elemental calcium total) by mouth 2 (two) times daily with a meal. 60 tablet 0  . furosemide (LASIX) 20 MG tablet Take 2 tablets (40 mg total) by mouth daily as needed. For morning leg swelling (Patient taking differently: Take 40 mg by mouth every other day.) 1 tablet 0  . pantoprazole (PROTONIX) 40 MG tablet Take 1 tablet (40 mg total) by mouth daily. 90 tablet 3  . phenol (CHLORASEPTIC) 1.4 % LIQD Use as directed 1 spray in the mouth or throat as needed for throat irritation / pain.  0  . potassium & sodium phosphates (PHOS-NAK) 280-160-250 MG PACK Take 1 packet by mouth 4 (four) times daily -  with meals and at bedtime. 120 each   . [START ON 08/28/2020] senna (SENOKOT) 8.6 MG TABS tablet Take 2 tablets (17.2 mg total) by mouth daily. 120 tablet 0    Drug Regimen Review Drug regimen was reviewed and remains appropriate with no significant issues identified  Home: Home Living Family/patient expects to be discharged to:: Private residence Living Arrangements: Alone   Functional History:    Functional Status:  Mobility:          ADL:    Cognition: Cognition Orientation Level: Oriented X4    Physical Exam: Blood pressure 128/74, pulse 88, temperature 98 F (36.7 C), temperature source Oral, resp. rate 16, weight 74.5 kg, SpO2 97 %. Physical Exam Gen: no distress, normal appearing HEENT: oral mucosa pink and moist, Significant bruising right orbital area. CN appear to be intact Cardio: Reg rate Chest: normal effort, normal rate of breathing Abd: soft, non-distended Ext: no edema Psych: pleasant, normal affect Skin: intact  Musculoskeletal:     Comments: Right wrist with splint in place  Neurological:     Comments: Patient is  alert and oriented x3 nad in no acute distress.  Makes eye contact with examiner.  Follows commands.  Oriented to person place and time. Moving right fingers and other extremities well.     No results found for this or any previous visit (from the past 48 hour(s)). No results found.  Medical Problem List and Plan: 1.  Traumatic SAH/ICH, right orbital fracture as well as right wrist fracture secondary to mechanical fall 08/18/2020.  Status post reconstruction of orbital floor 08/24/2020  -patient may shower but incisions must be covered  -ELOS/Goals: 7-10 days modI  -Admit to CIR 2.  Antithrombotics: -DVT/anticoagulation: SCDs  -antiplatelet therapy: N/A 3. Post-surgical pain: d/c IV morphine. Continue oxycodone as needed as well as Flexeril 4. Mood: Provide emotional support  -antipsychotic agents: N/A 5. Neuropsych: This patient is capable of making decisions on her own behalf. 6. Skin/Wound Care: Routine skin checks 7. Fluids/Electrolytes/Nutrition: Routine in and outs with follow-up chemistries 8.  Right nondisplaced fracture of distal radius.  Nonweightbearing at the wrist.  Cock up  splint.  Patient is using a platform walker.  No surgical intervention 9.  Hypertension.  Patient on Corgard 40 mg daily prior to admission stopped due to bradycardia.  Resume as needed. Monitor BP TID 10.  GERD.  Protonix 11.  Constipation.  Senokot daily, Dulcolax tablet daily as needed 12. Right knee severe OA: continue voltaren gel.   I have personally performed a face to face diagnostic evaluation, including, but not limited to relevant history and physical exam findings, of this patient and developed relevant assessment and plan.  Additionally, I have reviewed and concur with the physician assistant's documentation above.  Leeroy Cha, MD  Lavon Paganini Angiulli, PA-C

## 2020-08-27 NOTE — Discharge Summary (Signed)
Physician Discharge Summary  ALANEY WITTER GUY:[id number] DOB: October 15, 1932 DOA: 08/18/2020  PCP: Venia Carbon, MD  Admit date: 08/18/2020 Discharge date: 08/27/2020  Admitted From: Home Disposition: Acute inpatient rehab  Recommendations for Outpatient Follow-up:  1. Please schedule follow-up with ophthalmology after discharge from rehab.   Home Health: Not applicable Equipment/Devices: Not applicable  Discharge Condition: Stable CODE STATUS: DNR Diet recommendation: Low-salt diet  Discharge summary: 87 year old female with history of GERD, hypertension, hyperlipidemia brought to the hospital after sustaining fall at home.  She fell and hit her head.  No prodromal symptoms.  She was found to have multiple injuries including right orbital blowout fracture, small intracranial hemorrhage and right wrist fracture.  Seen by neurosurgery, trauma surgery and ophthalmology during hospitalization.  Assessment & Plan:   Right orbital blowout fracture/retrobulbar hematoma: Seen by Ophthalmology Dr. Gillian Scarce in the emergency room.  Recommended supportive care.  Follow-up with [facility] ophthalmology associates after discharge.  Clinically improving. Patient seen by plastic surgery .  Due to significant orbital floor blowout fracture,  Patient underwent orbital floor reconstruction 5/13.  Surgically stable as per surgery.   Right wrist fracture: discussed with orthopedics.  Recommended nonweightbearing on the wrist, short arm splint, ice compression and mobility. Bilateral subluxation of the patella, chronic.  Work with PT OT.  Minimal punctate traumatic intracranial hemorrhage and subarachnoid hemorrhage with no complication: Seen by neurosurgery.  Conservative management.  Fall/deconditioning: Work with PT OT.    Suggested rehab.  Essential hypertension: Normotensive.  Was on nadolol that was stopped due to bradycardia.  Will discontinue.  Patient is medically stable. Discharge to acute  inpatient rehab today for multidisciplinary rehab.   Discharge Diagnoses:  Active Problems:   Fall   Right orbital fracture, closed, initial encounter ([place])   Head injury    Discharge Instructions  Discharge Instructions    Diet - low sodium heart healthy   Complete by: As directed    Discharge wound care:   Complete by: As directed    Use vaseline at face incisions.   Increase activity slowly   Complete by: As directed      Allergies as of 08/27/2020      Reactions   Lipitor [atorvastatin]    Dizzy ( fuzziness)   Latex Rash      Medication List    STOP taking these medications   aspirin EC 81 MG tablet   nadolol 40 MG tablet Commonly known as: CORGARD     TAKE these medications   acetaminophen 500 MG tablet Commonly known as: TYLENOL Take 500 mg by mouth as needed for mild pain.   calcium carbonate 1250 (500 Ca) MG tablet Commonly known as: OS-CAL - dosed in mg of elemental calcium Take 1 tablet (500 mg of elemental calcium total) by mouth 2 (two) times daily with a meal.   furosemide 20 MG tablet Commonly known as: LASIX Take 2 tablets (40 mg total) by mouth daily as needed. For morning leg swelling What changed:   when to take this  additional instructions   pantoprazole 40 MG tablet Commonly known as: PROTONIX Take 1 tablet (40 mg total) by mouth daily.   phenol 1.4 % Liqd Commonly known as: CHLORASEPTIC Use as directed 1 spray in the mouth or throat as needed for throat irritation / pain.   potassium & sodium phosphates 280-160-250 MG Pack Commonly known as: PHOS-NAK Take 1 packet by mouth 4 (four) times daily -  with meals and at bedtime.  senna 8.6 MG Tabs tablet Commonly known as: SENOKOT Take 2 tablets (17.2 mg total) by mouth daily. Start taking on: Aug 28, 2020            Discharge Care Instructions  (From admission, onward)         Start     Ordered   08/27/20 0000  Discharge wound care:       Comments: Use vaseline at  face incisions.   08/27/20 1024          Follow-up Information    Viviana Simpler I, MD Follow up in 1 week(s).   Specialties: Internal Medicine, Pediatrics Contact information: [place] [address] [phone]        Cindra Presume, MD. Schedule an appointment as soon as possible for a visit in 1 week(s).   Specialty: Plastic Surgery Why: possible need for surgery Contact information: [address] [place] [address] [phone]        Katy Apo, MD. Schedule an appointment as soon as possible for a visit in 3 day(s).   Specialty: Ophthalmology Why: [facility].  Contact information: [place] [address] [phone]              Allergies  Allergen Reactions  . Lipitor [Atorvastatin]     Dizzy ( fuzziness)  . Latex Rash    Consultations:  Ophthalmology  Plastic surgery  Orthopedics, curbside   Procedures/Studies: DG Wrist Complete Right  Result Date: 08/18/2020 CLINICAL DATA:  [place] [address] at home. Patient fell over her walker. Right wrist pain. EXAM: RIGHT WRIST - COMPLETE 3+ VIEW COMPARISON:  None. FINDINGS: There is a subtle nondisplaced fracture of the distal radius, extending transversely across the metaphysis with evidence of a fracture line also intersecting the articular surface of the lunate facet. No other fractures. Widened scapholunate interval consistent with a disrupted scapholunate ligament, most likely chronic. Osteoarthritic changes at the scaphoid trapezium trapezoid articulation and the trapezium first metacarpal articulation with joint space narrowing, subchondral sclerosis and marginal osteophytes. Chondrocalcinosis noted along the triangular fibrocartilage complex. Mild dorsal soft tissue swelling. IMPRESSION: 1. Subtle nondisplaced fracture of the distal radial metaphysis with an intra-articular component. 2. No dislocation. 3. Disrupted scapholunate  ligament, likely chronic. Electronically Signed   By: Lajean Manes M.D.   On: 08/18/2020 08:10   DG Knee 1-2 Views Right  Result Date: 08/22/2020 CLINICAL DATA:  Pain and swelling following recent injury EXAM: RIGHT KNEE - 1-2 VIEW COMPARISON:  None. FINDINGS: Frontal and lateral views were obtained. There is lateral patellar subluxation. No fracture or frank dislocation. Small joint effusion. There is severe joint space narrowing medially and laterally with moderate narrowing of the patellofemoral joint. There is spurring in all compartments. No erosive change. There are foci of chondrocalcinosis. IMPRESSION: Lateral patellar subluxation. No fracture or dislocation. Small joint effusion. Extensive osteoarthritic change with narrowing most severe medially and laterally. Spurring noted in all compartments. There is chondrocalcinosis which may be seen with osteoarthritis or with calcium pyrophosphate deposition disease. Electronically Signed   By: Lowella Grip III M.D.   On: 08/22/2020 14:55   CT Head Wo Contrast  Result Date: 08/18/2020 CLINICAL DATA:  Patient fell striking the side of her face on a dresser. EXAM: CT HEAD WITHOUT CONTRAST CT ORBITS WITHOUT CONTRAST CT CERVICAL SPINE WITHOUT CONTRAST TECHNIQUE: Multidetector CT imaging of the head, cervical spine, and maxillofacial structures were performed using the standard protocol without intravenous  contrast. Multiplanar CT image reconstructions of the cervical spine and maxillofacial structures were also generated. COMPARISON:  None. FINDINGS: CT HEAD FINDINGS Brain: Small amount of subarachnoid hemorrhage lies along the anterior left frontal lobe. No other intracranial hemorrhage. Ventricles are normal in size, for the patient's age, and normal in configuration. There are no parenchymal masses or mass effect. There is no evidence of ischemic infarction. Mild hypoattenuation lines the periventricular white matter consistent with chronic  microvascular ischemic change. No extra-axial masses. Vascular: No hyperdense vessel or unexpected calcification. Skull: No skull fracture. Other: None. CT ORBIT FINDINGS Osseous: Comminuted, displaced and inferiorly depressed fractures of the right orbital floor, depressed into the maxillary sinus by 1.1 cm. Fractures extend from just posterior to the inferior orbital rim posteriorly to near the orbital apex. There is a nondisplaced fracture of the anterior, inferior medial orbital wall on the right. No other fractures.  No bone lesions. Orbits: Right-sided exophthalmos. There is hemorrhage in the right postseptal orbit, intraconal as well as extraconal spaces. The right globe is intact. The extraocular muscles and optic nerve appear intact. Normal left globe and orbit. Sinuses: Right maxillary sinus is opacified with hemorrhage. Mild mucosal thickening lines multiple right ethmoid air cells. Remaining sinuses and the mastoid air cells are clear. Soft tissues: Right periorbital soft tissue swelling/hemorrhage. CT CERVICAL SPINE FINDINGS Alignment: Normal. Skull base and vertebrae: No acute fracture. No primary bone lesion or focal pathologic process. Soft tissues and spinal canal: No prevertebral fluid or swelling. No visible canal hematoma. Disc levels: Moderate to marked loss of disc space noted from C3-C4 through the upper thoracic spine associated with endplate spurring and varying degrees of disc bulging. There are facet degenerative changes, less pronounced, most evident on the right at C2-C3. No convincing disc herniation. Upper chest: Enlarged heterogeneous thyroid gland. No acute findings. Other: None. IMPRESSION: HEAD CT 1. Small amount of subarachnoid hemorrhage over the anterior left frontal lobe. 2. No other acute intracranial abnormality. 3. No skull fracture. ORBITAL CT 1. Comminuted depressed right orbital floor fractures, with a fracture also involving the adjacent anterior inferior right medial  orbital wall. 2. Right-sided exophthalmos due to postseptal intra and extraconal orbital hemorrhage. Right globe appears intact. CERVICAL CT 1. No fracture or acute finding. 2. Enlarged heterogeneous thyroid gland. Recommend nonemergent/urgent thyroid ultrasound (ref: J Am Coll Radiol. 2015 Feb;12(2): 143-50). Critical Value/emergent results were called by telephone at the time of interpretation on 08/18/2020 at 8:54 am to provider [facility] , who verbally acknowledged these results. Electronically Signed   By: Lajean Manes M.D.   On: 08/18/2020 08:54   CT Cervical Spine Wo Contrast  Result Date: 08/18/2020 CLINICAL DATA:  Patient fell striking the side of her face on a dresser. EXAM: CT HEAD WITHOUT CONTRAST CT ORBITS WITHOUT CONTRAST CT CERVICAL SPINE WITHOUT CONTRAST TECHNIQUE: Multidetector CT imaging of the head, cervical spine, and maxillofacial structures were performed using the standard protocol without intravenous contrast. Multiplanar CT image reconstructions of the cervical spine and maxillofacial structures were also generated. COMPARISON:  None. FINDINGS: CT HEAD FINDINGS Brain: Small amount of subarachnoid hemorrhage lies along the anterior left frontal lobe. No other intracranial hemorrhage. Ventricles are normal in size, for the patient's age, and normal in configuration. There are no parenchymal masses or mass effect. There is no evidence of ischemic infarction. Mild hypoattenuation lines the periventricular white matter consistent with chronic microvascular ischemic change. No extra-axial masses. Vascular: No hyperdense vessel or unexpected calcification. Skull: No skull fracture. Other:  None. CT ORBIT FINDINGS Osseous: Comminuted, displaced and inferiorly depressed fractures of the right orbital floor, depressed into the maxillary sinus by 1.1 cm. Fractures extend from just posterior to the inferior orbital rim posteriorly to near the orbital apex. There is a nondisplaced fracture of the  anterior, inferior medial orbital wall on the right. No other fractures.  No bone lesions. Orbits: Right-sided exophthalmos. There is hemorrhage in the right postseptal orbit, intraconal as well as extraconal spaces. The right globe is intact. The extraocular muscles and optic nerve appear intact. Normal left globe and orbit. Sinuses: Right maxillary sinus is opacified with hemorrhage. Mild mucosal thickening lines multiple right ethmoid air cells. Remaining sinuses and the mastoid air cells are clear. Soft tissues: Right periorbital soft tissue swelling/hemorrhage. CT CERVICAL SPINE FINDINGS Alignment: Normal. Skull base and vertebrae: No acute fracture. No primary bone lesion or focal pathologic process. Soft tissues and spinal canal: No prevertebral fluid or swelling. No visible canal hematoma. Disc levels: Moderate to marked loss of disc space noted from C3-C4 through the upper thoracic spine associated with endplate spurring and varying degrees of disc bulging. There are facet degenerative changes, less pronounced, most evident on the right at C2-C3. No convincing disc herniation. Upper chest: Enlarged heterogeneous thyroid gland. No acute findings. Other: None. IMPRESSION: HEAD CT 1. Small amount of subarachnoid hemorrhage over the anterior left frontal lobe. 2. No other acute intracranial abnormality. 3. No skull fracture. ORBITAL CT 1. Comminuted depressed right orbital floor fractures, with a fracture also involving the adjacent anterior inferior right medial orbital wall. 2. Right-sided exophthalmos due to postseptal intra and extraconal orbital hemorrhage. Right globe appears intact. CERVICAL CT 1. No fracture or acute finding. 2. Enlarged heterogeneous thyroid gland. Recommend nonemergent/urgent thyroid ultrasound (ref: J Am Coll Radiol. 2015 Feb;12(2): 143-50). Critical Value/emergent results were called by telephone at the time of interpretation on 08/18/2020 at 8:54 am to provider [facility] , who  verbally acknowledged these results. Electronically Signed   By: Lajean Manes M.D.   On: 08/18/2020 08:54   DG HIP UNILAT WITH PELVIS 2-3 VIEWS RIGHT  Result Date: 08/18/2020 CLINICAL DATA:  [place] at home. Patient fell over her walker. Complaining of right-sided pain. EXAM: DG HIP (WITH OR WITHOUT PELVIS) 2-3V RIGHT COMPARISON:  12/27/2015. FINDINGS: No acute fracture.  Old fracture of the left inferior pubic ramus. Bilateral total hip arthroplasties appear well seated and aligned. SI joints and symphysis pubis are normally spaced and aligned. Skeletal structures are diffusely demineralized. Soft tissues are unremarkable. IMPRESSION: 1. No acute fracture or dislocation. No evidence of loosening of either hip arthroplasty. Electronically Signed   By: Lajean Manes M.D.   On: 08/18/2020 08:08   CT OrbitsS W/O CM  Result Date: 08/18/2020 CLINICAL DATA:  Patient fell striking the side of her face on a dresser. EXAM: CT HEAD WITHOUT CONTRAST CT ORBITS WITHOUT CONTRAST CT CERVICAL SPINE WITHOUT CONTRAST TECHNIQUE: Multidetector CT imaging of the head, cervical spine, and maxillofacial structures were performed using the standard protocol without intravenous contrast. Multiplanar CT image reconstructions of the cervical spine and maxillofacial structures were also generated. COMPARISON:  None. FINDINGS: CT HEAD FINDINGS Brain: Small amount of subarachnoid hemorrhage lies along the anterior left frontal lobe. No other intracranial hemorrhage. Ventricles are normal in size, for the patient's age, and normal in configuration. There are no parenchymal masses or mass effect. There is no evidence of ischemic infarction. Mild hypoattenuation lines the periventricular white matter consistent with chronic microvascular ischemic change.  No extra-axial masses. Vascular: No hyperdense vessel or unexpected calcification. Skull: No skull fracture. Other: None. CT ORBIT FINDINGS Osseous: Comminuted, displaced and inferiorly  depressed fractures of the right orbital floor, depressed into the maxillary sinus by 1.1 cm. Fractures extend from just posterior to the inferior orbital rim posteriorly to near the orbital apex. There is a nondisplaced fracture of the anterior, inferior medial orbital wall on the right. No other fractures.  No bone lesions. Orbits: Right-sided exophthalmos. There is hemorrhage in the right postseptal orbit, intraconal as well as extraconal spaces. The right globe is intact. The extraocular muscles and optic nerve appear intact. Normal left globe and orbit. Sinuses: Right maxillary sinus is opacified with hemorrhage. Mild mucosal thickening lines multiple right ethmoid air cells. Remaining sinuses and the mastoid air cells are clear. Soft tissues: Right periorbital soft tissue swelling/hemorrhage. CT CERVICAL SPINE FINDINGS Alignment: Normal. Skull base and vertebrae: No acute fracture. No primary bone lesion or focal pathologic process. Soft tissues and spinal canal: No prevertebral fluid or swelling. No visible canal hematoma. Disc levels: Moderate to marked loss of disc space noted from C3-C4 through the upper thoracic spine associated with endplate spurring and varying degrees of disc bulging. There are facet degenerative changes, less pronounced, most evident on the right at C2-C3. No convincing disc herniation. Upper chest: Enlarged heterogeneous thyroid gland. No acute findings. Other: None. IMPRESSION: HEAD CT 1. Small amount of subarachnoid hemorrhage over the anterior left frontal lobe. 2. No other acute intracranial abnormality. 3. No skull fracture. ORBITAL CT 1. Comminuted depressed right orbital floor fractures, with a fracture also involving the adjacent anterior inferior right medial orbital wall. 2. Right-sided exophthalmos due to postseptal intra and extraconal orbital hemorrhage. Right globe appears intact. CERVICAL CT 1. No fracture or acute finding. 2. Enlarged heterogeneous thyroid gland.  Recommend nonemergent/urgent thyroid ultrasound (ref: J Am Coll Radiol. 2015 Feb;12(2): 143-50). Critical Value/emergent results were called by telephone at the time of interpretation on 08/18/2020 at 8:54 am to provider [facility] , who verbally acknowledged these results. Electronically Signed   By: Lajean Manes M.D.   On: 08/18/2020 08:54    (Echo, Carotid, EGD, Colonoscopy, ERCP)    Subjective: Patient seen and examined.  No overnight events.  Looking forward to go to rehab.   Discharge Exam: Vitals:   08/26/20 2103 08/27/20 0437  BP:  (!) 147/84  Pulse: 88 70  Resp: 17 17  Temp: 97.8 F (36.6 C) 98.1 F (36.7 C)  SpO2: 97% 94%   Vitals:   08/26/20 0436 08/26/20 1446 08/26/20 2103 08/27/20 0437  BP: (!) 144/70 134/79 139/88 (!) 147/84  Pulse: (!) 58 89 88 70  Resp: 16 20 17 17   Temp: 98.1 F (36.7 C) 97.7 F (36.5 C) 97.8 F (36.6 C) 98.1 F (36.7 C)  TempSrc: Oral Oral Oral Oral  SpO2: 96% 93% 97% 94%    General: Pt is alert, awake, not in acute distress Cardiovascular: RRR, S1/S2 +, no rubs, no gallops Respiratory: CTA bilaterally, no wheezing, no rhonchi Abdominal: Soft, NT, ND, bowel sounds + Extremities: no edema, no cyanosis Patient has some ecchymosis around right IJ, ecchymosis right conjunctiva improving.  Both eyes symmetrical in the eye socket.  Postop incision clean and dry. Right hand on splint, nontender.    The results of significant diagnostics from this hospitalization (including imaging, microbiology, ancillary and laboratory) are listed below for reference.     Microbiology: Recent Results (from the past 240 hour(s))  Resp  Panel by RT-PCR (Flu A&B, Covid) Nasopharyngeal Swab     Status: None   Collection Time: 08/18/20  9:11 AM   Specimen: Nasopharyngeal Swab; Nasopharyngeal(NP) swabs in vial transport medium  Result Value Ref Range Status   SARS Coronavirus 2 by RT PCR NEGATIVE NEGATIVE Final    Comment: (NOTE) SARS-CoV-2 target  nucleic acids are NOT DETECTED.  The SARS-CoV-2 RNA is generally detectable in upper respiratory specimens during the acute phase of infection. The lowest concentration of SARS-CoV-2 viral copies this assay can detect is 138 copies/mL. A negative result does not preclude SARS-Cov-2 infection and should not be used as the sole basis for treatment or other patient management decisions. A negative result may occur with  improper specimen collection/handling, submission of specimen other than nasopharyngeal swab, presence of viral mutation(s) within the areas targeted by this assay, and inadequate number of viral copies(<138 copies/mL). A negative result must be combined with clinical observations, patient history, and epidemiological information. The expected result is Negative.  Fact Sheet for Patients:  BloggerCourse.com  Fact Sheet for Healthcare Providers:  SeriousBroker.it  This test is no t yet approved or cleared by the [place] FDA and  has been authorized for detection and/or diagnosis of SARS-CoV-2 by FDA under an Emergency Use Authorization (EUA). This EUA will remain  in effect (meaning this test can be used) for the duration of the COVID-19 declaration under Section 564(b)(1) of the Act, 21 U.S.C.section 360bbb-3(b)(1), unless the authorization is terminated  or revoked sooner.       Influenza A by PCR NEGATIVE NEGATIVE Final   Influenza B by PCR NEGATIVE NEGATIVE Final    Comment: (NOTE) The Xpert Xpress SARS-CoV-2/FLU/RSV plus assay is intended as an aid in the diagnosis of influenza from Nasopharyngeal swab specimens and should not be used as a sole basis for treatment. Nasal washings and aspirates are unacceptable for Xpert Xpress SARS-CoV-2/FLU/RSV testing.  Fact Sheet for Patients: BloggerCourse.com  Fact Sheet for Healthcare  Providers: SeriousBroker.it  This test is not yet approved or cleared by the [place] FDA and has been authorized for detection and/or diagnosis of SARS-CoV-2 by FDA under an Emergency Use Authorization (EUA). This EUA will remain in effect (meaning this test can be used) for the duration of the COVID-19 declaration under Section 564(b)(1) of the Act, 21 U.S.C. section 360bbb-3(b)(1), unless the authorization is terminated or revoked.  Performed at [facility], [address], [place], [place] [address]   Surgical pcr screen     Status: None   Collection Time: 08/24/20  8:59 AM   Specimen: Nasal Mucosa; Nasal Swab  Result Value Ref Range Status   MRSA, PCR NEGATIVE NEGATIVE Final   Staphylococcus aureus NEGATIVE NEGATIVE Final    Comment: (NOTE) The Xpert SA Assay (FDA approved for NASAL specimens in patients 13 years of age and older), is one component of a comprehensive surveillance program. It is not intended to diagnose infection nor to guide or monitor treatment. Performed at [facility], [address] [place], [place] [address]      Labs: BNP (last 3 results) No results for input(s): BNP in the last 8760 hours. Basic Metabolic Panel: Recent Labs  Lab 08/22/20 0134  NA 137  K 3.6  CL 105  CO2 25  GLUCOSE 102*  BUN 9  CREATININE 0.74  CALCIUM 8.7*  MG 2.0  PHOS 2.0*   Liver Function Tests: Recent Labs  Lab 08/22/20 0134  AST 16  ALT 11  ALKPHOS 41  BILITOT 1.3*  PROT 5.4*  ALBUMIN 2.3*   No results for input(s): LIPASE, AMYLASE in the last 168 hours. No results for input(s): AMMONIA in the last 168 hours. CBC: Recent Labs  Lab 08/22/20 0134  WBC 7.4  HGB 11.3*  HCT 34.9*  MCV 94.3  PLT 148*   Cardiac Enzymes: No results for input(s): CKTOTAL, CKMB, CKMBINDEX, TROPONINI in the last 168 hours. BNP: Invalid input(s): POCBNP CBG: No results for input(s): GLUCAP in the last 168 hours. D-Dimer No  results for input(s): DDIMER in the last 72 hours. Hgb A1c No results for input(s): HGBA1C in the last 72 hours. Lipid Profile No results for input(s): CHOL, HDL, LDLCALC, TRIG, CHOLHDL, LDLDIRECT in the last 72 hours. Thyroid function studies No results for input(s): TSH, T4TOTAL, T3FREE, THYROIDAB in the last 72 hours.  Invalid input(s): FREET3 Anemia work up No results for input(s): VITAMINB12, FOLATE, FERRITIN, TIBC, IRON, RETICCTPCT in the last 72 hours. Urinalysis    Component Value Date/Time   BILIRUBINUR negative 02/26/2016 0937   PROTEINUR negative 02/26/2016 0937   UROBILINOGEN negative 02/26/2016 0937   NITRITE negative 02/26/2016 0937   LEUKOCYTESUR Trace (A) 02/26/2016 0937   Sepsis Labs Invalid input(s): PROCALCITONIN,  WBC,  LACTICIDVEN Microbiology Recent Results (from the past 240 hour(s))  Resp Panel by RT-PCR (Flu A&B, Covid) Nasopharyngeal Swab     Status: None   Collection Time: 08/18/20  9:11 AM   Specimen: Nasopharyngeal Swab; Nasopharyngeal(NP) swabs in vial transport medium  Result Value Ref Range Status   SARS Coronavirus 2 by RT PCR NEGATIVE NEGATIVE Final    Comment: (NOTE) SARS-CoV-2 target nucleic acids are NOT DETECTED.  The SARS-CoV-2 RNA is generally detectable in upper respiratory specimens during the acute phase of infection. The lowest concentration of SARS-CoV-2 viral copies this assay can detect is 138 copies/mL. A negative result does not preclude SARS-Cov-2 infection and should not be used as the sole basis for treatment or other patient management decisions. A negative result may occur with  improper specimen collection/handling, submission of specimen other than nasopharyngeal swab, presence of viral mutation(s) within the areas targeted by this assay, and inadequate number of viral copies(<138 copies/mL). A negative result must be combined with clinical observations, patient history, and epidemiological information. The expected  result is Negative.  Fact Sheet for Patients:  EntrepreneurPulse.com.au  Fact Sheet for Healthcare Providers:  IncredibleEmployment.be  This test is no t yet approved or cleared by the [place] FDA and  has been authorized for detection and/or diagnosis of SARS-CoV-2 by FDA under an Emergency Use Authorization (EUA). This EUA will remain  in effect (meaning this test can be used) for the duration of the COVID-19 declaration under Section 564(b)(1) of the Act, 21 U.S.C.section 360bbb-3(b)(1), unless the authorization is terminated  or revoked sooner.       Influenza A by PCR NEGATIVE NEGATIVE Final   Influenza B by PCR NEGATIVE NEGATIVE Final    Comment: (NOTE) The Xpert Xpress SARS-CoV-2/FLU/RSV plus assay is intended as an aid in the diagnosis of influenza from Nasopharyngeal swab specimens and should not be used as a sole basis for treatment. Nasal washings and aspirates are unacceptable for Xpert Xpress SARS-CoV-2/FLU/RSV testing.  Fact Sheet for Patients: EntrepreneurPulse.com.au  Fact Sheet for Healthcare Providers: IncredibleEmployment.be  This test is not yet approved or cleared by the [place] FDA and has been authorized for detection and/or diagnosis of SARS-CoV-2 by FDA under an Emergency Use  Authorization (EUA). This EUA will remain in effect (meaning this test can be used) for the duration of the COVID-19 declaration under Section 564(b)(1) of the Act, 21 U.S.C. section 360bbb-3(b)(1), unless the authorization is terminated or revoked.  Performed at [facility], [place] [address], [place], [place] [address]   Surgical pcr screen     Status: None   Collection Time: 08/24/20  8:59 AM   Specimen: Nasal Mucosa; Nasal Swab  Result Value Ref Range Status   MRSA, PCR NEGATIVE NEGATIVE Final   Staphylococcus aureus NEGATIVE NEGATIVE Final    Comment: (NOTE) The Xpert SA Assay  (FDA approved for NASAL specimens in patients 60 years of age and older), is one component of a comprehensive surveillance program. It is not intended to diagnose infection nor to guide or monitor treatment. Performed at [facility], [place] [address] [place],  [address]      Time coordinating discharge:  32 minutes  SIGNED:   Barb Merino, MD  [facility] 08/27/2020, 10:24 AM

## 2020-08-27 NOTE — Progress Notes (Signed)
Sydney Gong, RN  Rehab Admission Coordinator  Physical Medicine and Rehabilitation  PMR Pre-admission      Signed  Date of Service:  08/23/2020  1:53 PM      Related encounter: ED to Hosp-Admission (Discharged) from 08/18/2020 in [place]       Signed          Show:Clear all [x] Manual[x] Template[x] Copied  Added by: [x] Sydney Gong, RN   [] Hover for details  PMR Admission Coordinator Pre-Admission Assessment   Patient: Sydney [name] is an 87 y.o., female MRN: [id number] DOB: 08/22/1932 Height:   Weight:                                                                                                                                                    Insurance Information HMO:     PPO: yes     PCP:      IPA:      80/20:      OTHER:  PRIMARY: [employer]      Policy#: [id number]      Subscriber: pt CM Name: Shirlean Mylar      Phone#: [phone] ext [phone]    Fax#: [phone] Pre-Cert#: [id number] approved for 7 days and f/u with Loraine ext [phone] Fax: same       Employer:  Benefits:  Phone #: [phone]     Name: 5/12 Eff. Date: 04/15/2019     Deduct: none      Out of Pocket Max: $4000      Life Max: none  CIR: $160 co pay per day days 1 until 10      SNF: no copay days 1 unitl 20; $188 co pay p er day days 21 until 41; no copay days 42 until 100 Outpatient: $20 per visit     Co-Pay: visits per medcial neccesity Home Health: 100%      Co-Pay: visits per medcial neccesity DME: 80%     Co-Pay: 20% Providers: in network  SECONDARY: none        Financial Counselor:       Phone#:    [employer]" for patients in Inpatient Rehabilitation Facilities with attached "Privacy Act [place] Records" was provided and verbally reviewed with: Patient and Family   Emergency Contact Information         Contact Information     Name Relation Home Work Mobile    Sydney [name], Sydney [name] Son     [phone]     Sydney [name] Daughter     [phone]       Current Medical History  Patient Admitting Diagnosis: right wrist fx and right orbital fx as well as SAH s/p mechanical fall   History of Present Illness:  87 y.o. right-handed female with history of hypertension, hyperlipidemia, PMR, iliac  artery aneurysm, bilateral hip replacements.  Presented 08/18/2020 after mechanical fall while walking to the bathroom when she slipped and struck her head on a dresser.  Denied loss of consciousness.  Denied any chest pain or shortness of breath associated with the fall.  Cranial CT scan showed small amount of subarachnoid hemorrhage of the anterior left frontal lobe.  No other acute intracranial abnormality.  No skull fracture.  Patient sustained comminuted depressed right orbital floor fracture with fracture also involving the adjacent anterior inferior right medial orbital wall as well as right side exophthalmos.  CT cervical spine negative for fracture.  X-rays of right wrist showed subtle nondisplaced fracture of the distal radius metaphysis with an intra-articular component.  No dislocation.  Admission chemistries unremarkable except glucose 135.  Neurosurgery follow-up for small traumatic SAH/ICH Dr. Christella Noa no surgical intervention and monitor.   Conservative care of right nondisplaced fracture of distal radius nonweightbearing with cock up splint and no current surgical intervention.    Follow-up plastic surgery Dr. Claudia Desanctis in regards to right orbital floor fracture and underwent reconstruction of orbital floor 08/24/2020 per Dr. Claudia Desanctis.  Advised conservative care of right nondisplaced fracture of distal radius nonweightbearing with cock up splint and no current surgical intervention.     Past Medical History      Past Medical History:  Diagnosis Date  . GERD (gastroesophageal reflux disease)    . Hx of lumbosacral spine surgery t  . Hyperlipidemia    . Hypertension    . Iliac artery aneurysm ([place])    .  Lumbar spinal stenosis    . Polymyalgia rheumatica ([place]) 06/2016      Family History  family history includes Cancer in her brother; Diabetes in her sister; Heart attack in her father; Heart disease in her brother, brother, father, mother, and sister; Hyperlipidemia in her brother and father.   Prior Rehab/Hospitalizations:  Has the patient had prior rehab or hospitalizations prior to admission? Yes   Has the patient had major surgery during 100 days prior to admission? Yes   Current Medications    Current Facility-Administered Medications:  .  acetaminophen (TYLENOL) tablet 500 mg, 500 mg, Oral, Q6H PRN, Darliss Cheney, MD, 500 mg at 08/26/20 0305 .  bisacodyl (DULCOLAX) EC tablet 5 mg, 5 mg, Oral, Daily PRN, Caren Griffins, MD, 5 mg at 08/20/20 0255 .  cyclobenzaprine (FLEXERIL) tablet 5 mg, 5 mg, Oral, TID PRN, Caren Griffins, MD, 5 mg at 08/24/20 1656 .  diclofenac Sodium (VOLTAREN) 1 % topical gel 2 g, 2 g, Topical, QID, Pokhrel, Laxman, MD, 2 g at 08/27/20 0944 .  hydrALAZINE (APRESOLINE) injection 10 mg, 10 mg, Intravenous, Once, Pfeiffer, Marcy, MD .  hydrALAZINE (APRESOLINE) injection 10 mg, 10 mg, Intravenous, Q4H PRN, Pahwani, Ravi, MD .  morphine 2 MG/ML injection 2 mg, 2 mg, Intravenous, Q2H PRN, Charlesetta Shanks, MD, 2 mg at 08/20/20 0255 .  ondansetron (ZOFRAN) injection 4 mg, 4 mg, Intravenous, Q4H PRN, Charlesetta Shanks, MD, 4 mg at 08/18/20 0856 .  ondansetron (ZOFRAN) tablet 4 mg, 4 mg, Oral, Q6H PRN, 4 mg at 08/20/20 1539 **OR** ondansetron (ZOFRAN) injection 4 mg, 4 mg, Intravenous, Q6H PRN, Pahwani, Ravi, MD .  oxyCODONE (Oxy IR/ROXICODONE) immediate release tablet 5 mg, 5 mg, Oral, Q4H PRN, Darliss Cheney, MD, 5 mg at 08/26/20 1453 .  pantoprazole (PROTONIX) EC tablet 40 mg, 40 mg, Oral, Daily, Pahwani, Ravi, MD, 40 mg at 08/27/20 0939 .  phenol (CHLORASEPTIC) mouth spray 1 spray,  1 spray, Mouth/Throat, PRN, Barb Merino, MD, 1 spray at 08/25/20 1745 .  potassium  & sodium phosphates (PHOS-NAK) 280-160-250 MG packet 1 packet, 1 packet, Oral, TID WC & HS, Barb Merino, MD, 1 packet at 08/27/20 [phone] .  senna (SENOKOT) tablet 17.2 mg, 2 tablet, Oral, Daily, Lovorn, Megan, MD, 17.2 mg at 08/27/20 [id number]   Patients Current Diet:     Diet Order                      Diet - low sodium heart healthy              Diet regular Room service appropriate? Yes; Fluid consistency: Thin  Diet effective now                      Precautions / Restrictions Precautions Precautions: Fall,Other (comment) (R wrist fx) Precaution Comments: NWB in RUE, can WB through elbow as tolerated. Restrictions Weight Bearing Restrictions: Yes RUE Weight Bearing: Weight bear through elbow only Other Position/Activity Restrictions: ok to WB through elbow on platform    Has the patient had 2 or more falls or a fall with injury in the past year?Yes   Prior Activity Level Limited Community (1-2x/wk): Mod I with rollator    Limited community due to COVID pandemic   Prior Functional Level Prior Function Level of Independence: Independent with assistive device(s) Comments: uses a rollator in the house and a cane in the community.  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  Since the onset of the pandemic, she stopped driving and going out in the community.  She has a local son and grand daughter who provide transportation and get her groceries.   Self Care: Did the patient need help bathing, dressing, using the toilet or eating?  Independent   Indoor Mobility: Did the patient need assistance with walking from room to room (with or without device)? Independent   Stairs: Did the patient need assistance with internal or external stairs (with or without device)? Independent   Functional Cognition: Did the patient need help planning regular tasks such as shopping or remembering to take medications? Independent   Home Assistive Devices / Equipment Home Equipment:  Cane - single point,Walker - 4 wheels,Shower seat   Prior Device Use: Indicate devices/aids used by the patient prior to current illness, exacerbation or injury? Walker   Current Functional Level Cognition   Overall Cognitive Status: Within Functional Limits for tasks assessed Orientation Level: Oriented X4 Following Commands: Follows one step commands with increased time Safety/Judgement: Decreased awareness of safety,Decreased awareness of deficits General Comments: Not specifically tested, but followed commands, conversation normal, processing speed seemed normal today    Extremity Assessment (includes Sensation/Coordination)   Upper Extremity Assessment: Generalized weakness,RUE deficits/detail RUE Deficits / Details: Rt wrist splint in place.  Wrist not assessed.  Fingers and shoulder grossly WFL RUE: Unable to fully assess due to pain,Unable to fully assess due to immobilization RUE Coordination: decreased fine motor,decreased gross motor  Lower Extremity Assessment: Generalized weakness,RLE deficits/detail RLE Deficits / Details: R anterior hip and groin pain limiting hip AROM and activity tolerance RLE: Unable to fully assess due to pain     ADLs   Overall ADL's : Needs assistance/impaired Eating/Feeding: Set up,Sitting Eating/Feeding Details (indicate cue type and reason): Pt able to bring cup to her mouth and drink Grooming: Wash/dry hands,Wash/dry face,Brushing hair,Sitting Grooming Details (indicate cue type and reason): completed sitting EOB  Upper Body Bathing: Minimal assistance,Sitting Lower Body Bathing: Moderate assistance,Sitting/lateral leans,Sit to/from stand Lower Body Bathing Details (indicate cue type and reason): completed standing EOB, pt able to complete pericare, OT assisted with legs and bottom. Upper Body Dressing : Minimal assistance,Sitting Lower Body Dressing: Moderate assistance,Sit to/from stand Lower Body Dressing Details (indicate cue type and  reason): Pt able to don/doff Lt sock, but unable to don/doff Rt sock due to Rt groin pain Toilet Transfer: Min Public house manager Details (indicate cue type and reason): simulated with side steps and forward steps from bed. Complete x3 Toileting- Clothing Manipulation and Hygiene: Min guard,Sitting/lateral lean Toileting - Clothing Manipulation Details (indicate cue type and reason): Pt able to complete pericare Functional mobility during ADLs: Min guard,Rolling walker General ADL Comments: Pt taking small steps for transfers this session, stood x3 with R platform RW. Pt able to stand and complete pericare with min guard, needed mod assist for legs and feet. Grooming completed with set up EOB     Mobility   Overal bed mobility: Needs Assistance Bed Mobility: Supine to Sit,Sit to Supine Rolling: Mod assist Supine to sit: Min guard Sit to supine: Min guard General bed mobility comments: min guard to move oob and back into bed.     Transfers   Overall transfer level: Needs assistance Equipment used: Right platform walker Transfers: Sit to/from Stand Sit to Stand: Min assist Stand pivot transfers: Mod assist,+2 physical assistance General transfer comment: Cues for hand placement and safety to maintain R elbow weight bearing.  Mild posterior lean in standing.     Ambulation / Gait / Stairs / Wheelchair Mobility   Ambulation/Gait Ambulation/Gait assistance: Mod assist Gait Distance (Feet): 35 Feet Assistive device: Right platform walker Gait Pattern/deviations: Step-to pattern,Trunk flexed,Decreased stride length,Decreased stance time - right General Gait Details: Cues for upper trunk control, advancement of RW and increasing foot clearance on LLE.  noted to shuffle LLE forward due to R LE pain in stance phase.  Fatigues quickly with increase WOb but able to progress gt distance. Gait velocity: remains slow but speed improved. Stairs: Yes Stairs assistance: Mod  assist Stair Management: One rail Left,Step to pattern,Forwards Number of Stairs: 2 General stair comments: Attempted to go up with one or both hands on L rail, however, pt with too much pain in R groin while stepping LLE up; tehn used L hand on rail and RUE over the shoulder assist from PT; able to go up 2 steps in this fashion; pt exhausted and requested to stop at 2 steps     Posture / Balance Balance Overall balance assessment: Needs assistance Sitting-balance support: Feet supported Sitting balance-Leahy Scale: Good Standing balance support: Bilateral upper extremity supported Standing balance-Leahy Scale: Fair Standing balance comment: Able to stand on her own, not stable with dynamic standing.     Special needs/care consideration Fall precautions    Previous Home Environment  Living Arrangements: Alone (daughter, Sydney [name], here from [place] to assist)  Lives With: Alone (spouse died 05-17-2022) Available Help at Discharge: Family,Available 24 hours/day Type of Home: House Home Layout: One level Home Access: Stairs to enter Entrance Stairs-Rails: Left Entrance Stairs-Number of Steps: 3 Bathroom Shower/Tub: Walk-in shower,Other (comment) (walk in tub) Bathroom Toilet: Handicapped height Bathroom Accessibility: Yes How Accessible: Accessible via walker [place]: No Additional Comments: Pt's spouse died in 2022-05-17.  She has a son and grand daughter locally who assist as needed.   Discharge Living Setting Plans for Discharge Living Setting: Patient's home,Alone Type  of Home at Discharge: House Discharge Home Layout: One level Discharge Home Access: Stairs to enter Entrance Stairs-Rails: Left Entrance Stairs-Number of Steps: 3 Discharge Bathroom Shower/Tub: Walk-in shower (walk in tub) Discharge Bathroom Toilet: Handicapped height Discharge Bathroom Accessibility: Yes How Accessible: Accessible via walker Does the patient have any problems obtaining your medications?:  No   Social/Family/Support Systems Contact Information: daughter, Sydney [name], here from [place] Anticipated Caregiver: daughter, Sydney [name] Anticipated Ambulance person Information: see above Ability/Limitations of Caregiver: none Caregiver Availability: 24/7 Discharge Plan Discussed with Primary Caregiver: Yes Is Caregiver In Agreement with Plan?: Yes Does Caregiver/Family have Issues with Lodging/Transportation while Pt is in Rehab?: No   Goals Patient/Family Goal for Rehab: supervision to min asisst with PT and OT Expected length of stay: ELOS 10 to 14 days Pt/Family Agrees to Admission and willing to participate: Yes Program Orientation Provided & Reviewed with Pt/Caregiver Including Roles  & Responsibilities: Yes   Decrease burden of Care through IP rehab admission: n/a   Possible need for SNF placement upon discharge:not antiicapted   Patient Condition: This patient's medical and functional status has changed since the consult dated: 08/21/2020 in which the Rehabilitation Physician determined and documented that the patient's condition is appropriate for intensive rehabilitative care in an inpatient rehabilitation facility. See "History of Present Illness" (above) for medical update. Functional changes are: overall min to mod assist. Patient's medical and functional status update has been discussed with the Rehabilitation physician and patient remains appropriate for inpatient rehabilitation. Will admit to inpatient rehab today.   Preadmission Screen Completed By:  Cleatrice Burke, RN, 08/27/2020 10:32 AM ______________________________________________________________________   Discussed status with Dr. Ranell Patrick on 08/27/2020 at  1033 and received approval for admission today.   Admission Coordinator:  Cleatrice Burke, time 6237 Date 08/27/2020             Cosigned by: Izora Ribas, MD at 08/27/2020 10:34 AM    Revision History                              Note Details  Author Sydney Gong, RN File Time 08/27/2020 10:32 AM  Author Type Rehab Admission Coordinator Status Signed  Last Editor Sydney Gong, RN Service Physical Medicine and [place] # [id number] Admit Date 08/27/2020

## 2020-08-27 NOTE — Progress Notes (Signed)
Met with the patient and daughter to introduce self and role of the nurse CM. Initiated education on safety, risks, prevention and rehab process. Reviewed HTN and HLD management and tips for dietary modification for prediabetes; A1C 6.1. Also given handout on diet and pain control. Continue to follow along to discharge to address questions about medications, care and collaborate with the Sw to facilitate preparation for discharge and plan of care. Sydney [name]

## 2020-08-27 NOTE — Progress Notes (Signed)
Inpatient Rehabilitation Admissions Coordinator  I have insurance approval and CIR bed to admit patient to today. I met with patient and her daughter at bedside and patient prefers CIR admit rather than SNF. I have alerted acute team and TOC. I will make the arrangements to admit today.  Danne Baxter, RN, MSN Rehab Admissions Coordinator [phone] 08/27/2020 10:24 AM

## 2020-08-28 DIAGNOSIS — G8918 Other acute postprocedural pain: Secondary | ICD-10-CM

## 2020-08-28 DIAGNOSIS — E8809 Other disorders of plasma-protein metabolism, not elsewhere classified: Secondary | ICD-10-CM

## 2020-08-28 DIAGNOSIS — K5901 Slow transit constipation: Secondary | ICD-10-CM | POA: Diagnosis not present

## 2020-08-28 DIAGNOSIS — E46 Unspecified protein-calorie malnutrition: Secondary | ICD-10-CM

## 2020-08-28 DIAGNOSIS — S52501D Unspecified fracture of the lower end of right radius, subsequent encounter for closed fracture with routine healing: Secondary | ICD-10-CM

## 2020-08-28 DIAGNOSIS — I10 Essential (primary) hypertension: Secondary | ICD-10-CM | POA: Diagnosis not present

## 2020-08-28 DIAGNOSIS — S066X0D Traumatic subarachnoid hemorrhage without loss of consciousness, subsequent encounter: Principal | ICD-10-CM

## 2020-08-28 LAB — CBC WITH DIFFERENTIAL/PLATELET
Abs Immature Granulocytes: 0.03 10*3/uL (ref 0.00–0.07)
Basophils Absolute: 0 10*3/uL (ref 0.0–0.1)
Basophils Relative: 1 %
Eosinophils Absolute: 0.2 10*3/uL (ref 0.0–0.5)
Eosinophils Relative: 4 %
HCT: 42 % (ref 36.0–46.0)
Hemoglobin: 13.6 g/dL (ref 12.0–15.0)
Immature Granulocytes: 1 %
Lymphocytes Relative: 30 %
Lymphs Abs: 1.7 10*3/uL (ref 0.7–4.0)
MCH: 30.4 pg (ref 26.0–34.0)
MCHC: 32.4 g/dL (ref 30.0–36.0)
MCV: 94 fL (ref 80.0–100.0)
Monocytes Absolute: 0.7 10*3/uL (ref 0.1–1.0)
Monocytes Relative: 12 %
Neutro Abs: 3 10*3/uL (ref 1.7–7.7)
Neutrophils Relative %: 52 %
Platelets: 326 10*3/uL (ref 150–400)
RBC: 4.47 MIL/uL (ref 3.87–5.11)
RDW: 13.3 % (ref 11.5–15.5)
WBC: 5.6 10*3/uL (ref 4.0–10.5)
nRBC: 0 % (ref 0.0–0.2)

## 2020-08-28 LAB — COMPREHENSIVE METABOLIC PANEL
ALT: 16 U/L (ref 0–44)
AST: 18 U/L (ref 15–41)
Albumin: 2.9 g/dL — ABNORMAL LOW (ref 3.5–5.0)
Alkaline Phosphatase: 57 U/L (ref 38–126)
Anion gap: 8 (ref 5–15)
BUN: 15 mg/dL (ref 8–23)
CO2: 24 mmol/L (ref 22–32)
Calcium: 9.6 mg/dL (ref 8.9–10.3)
Chloride: 105 mmol/L (ref 98–111)
Creatinine, Ser: 0.88 mg/dL (ref 0.44–1.00)
GFR, Estimated: >60 mL/min (ref >60)
Glucose, Bld: 115 mg/dL — ABNORMAL HIGH (ref 70–99)
Potassium: 4.2 mmol/L (ref 3.5–5.1)
Sodium: 137 mmol/L (ref 135–145)
Total Bilirubin: 0.7 mg/dL (ref 0.3–1.2)
Total Protein: 6.3 g/dL — ABNORMAL LOW (ref 6.5–8.1)

## 2020-08-28 MED ORDER — PROSOURCE PLUS PO LIQD
30.0000 mL | Freq: Two times a day (BID) | ORAL | Status: DC
  Administered 2020-08-28 – 2020-09-05 (×16): 30 mL via ORAL
  Filled 2020-08-28 (×17): qty 30

## 2020-08-28 NOTE — Progress Notes (Signed)
Inpatient Rehabilitation Care Coordinator Assessment and Plan Patient Details  Name: Sydney [name] MRN: [id number] Date of Birth: [birth year]  Today's Date: 08/28/2020  Hospital Problems: Principal Problem:   Traumatic subarachnoid hemorrhage [facility])  Past Medical History:  Past Medical History:  Diagnosis Date  . GERD (gastroesophageal reflux disease)   . Hx of lumbosacral spine surgery t  . Hyperlipidemia   . Hypertension   . Iliac artery aneurysm ([place])   . Lumbar spinal stenosis   . Polymyalgia rheumatica ([place]) 06/2016   Past Surgical History:  Past Surgical History:  Procedure Laterality Date  . JOINT REPLACEMENT  1992   right hip replacement  . JOINT REPLACEMENT  2002   left hip replacement  . ORIF ORBITAL FRACTURE Right 08/24/2020   Procedure: open reduction internal fixation of right orbital floor fracture;  Surgeon: Cindra Presume, MD;  Location: [place];  Service: Plastics;  Laterality: Right;  . ROTATOR CUFF REPAIR  2002   Left shoulder  . SPINE SURGERY  2006 & 2010   posterior spinal fusion L4-5 L5S1   Social History:  reports that she has never smoked. She has never used smokeless tobacco. She reports that she does not drink alcohol and does not use drugs.  Family / Support Systems Marital Status: Widow/Widower How Long?: May 01, 2020 Patient Roles: Parent Children: Roger-son [phone]  Dionisio Paschal [phone] Other Supports: granddaughter Anticipated Caregiver: [name] is here from [facility] and will stay with a while for pt to recover  Son and granddaughter will assist once daughter goes back home and were providing transportation prior to admission Ability/Limitations of Caregiver: None Caregiver Availability: 24/7 Family Dynamics: Close with children and family, had been caring for ill husband before he passed away in May 01, 2020. She feels she has good supports via family and church members.  Social History Preferred language: English Religion:  Baptist Cultural Background: No issues Education: HS Read: Yes Write: Yes Employment Status: Retired Public relations account executive Issues: No issues Guardian/Conservator: None-according to MD pt is capable of making her own decisions while here. Daughter will be here daily also   Abuse/Neglect Abuse/Neglect Assessment Can Be Completed: Yes Physical Abuse: Denies Verbal Abuse: Denies Sexual Abuse: Denies Exploitation of patient/patient's resources: Denies Self-Neglect: Denies  Emotional Status Pt's affect, behavior and adjustment status: Pt is motivated to do well and recover from her fall. She feels she is doing better than when it happened. She feels better and is moving better. She feels fortuanate she was not hurt worse. Recent Psychosocial Issues: other health issues but were managed Psychiatric History: NO history deferred depression screen due to coping appropriately and positive regarding recovery. She did lose her husband recently and may benefit from talking with neuro-psych while here. Will get input from team Substance Abuse History: No issues  Patient / Family Perceptions, Expectations & Goals Pt/Family understanding of illness & functional limitations: Pt and daughter have a good understanding of her injuries-SAH, fractured orbital and wrist fracture. She talks with the MD daily and feels her questions and concerns have been addressed. Premorbid pt/family roles/activities: Mom, grandmother, home owner, church member, neighbor, Social research officer, government Anticipated changes in roles/activities/participation: resume Pt/family expectations/goals: Pt states: " I am doing better than I expected at this time, but did a good job falling."  Daughter states: " She is resilient and will keep pushing herself."  [employer]: None Premorbid Home Care/DME Agencies: Other (Comment) (has rw, bsc, elevated commode, wc,tub seat from husband) Transportation available at discharge: Pt has  a  drivers license, but lets son and granddaughter transports to appointments and stores  Discharge Planning Living Arrangements: Alone Support Systems: Children,Other relatives,Friends/neighbors,Church/faith community Type of Residence: Private residence Insurance Resources: Multimedia programmer (specify) ([place] Medicare) Financial Resources: [place] Referred: No Living Expenses: Own Money Management: Patient Does the patient have any problems obtaining your medications?: No Home Management: Self daughter to assist now Patient/Family Preliminary Plans: Return home with daughter staying here from [facility] for a short time until she is recovered. Pt is doing well considering how hard she fell. Aware team evaluating and setting goals for rehab. Team conference tomorrow and will work on discharge needs Care Coordinator Anticipated Follow Up Needs: [place] female who is motivated to do well and recover from her fall. Her daughter is here from [facility] to assist her at discharge. Son and granddaughter are local and will assist also. Will await team setting goals and work on discharge needs. Pt has multiple pieces of equipment from late husband. Will see if would benefit from neuro-psych while here.  Elease Hashimoto 08/28/2020, 9:51 AM

## 2020-08-28 NOTE — Evaluation (Signed)
Occupational Therapy Assessment and Plan  Patient Details  Name: DESHUNDA THACKSTON MRN: [id number] Date of Birth: Jul 16, 1932  OT Diagnosis: acute pain and muscle weakness (generalized) Rehab Potential: Rehab Potential (ACUTE ONLY): Excellent ELOS: 10 days   Today's Date: 08/28/2020 OT Individual Time: 4098-1191 and 4782-9562 OT Individual Time Calculation (min): 60 min   And 54 min  Hospital Problem: Principal Problem:   Traumatic subarachnoid hemorrhage ([place])   Past Medical History:  Past Medical History:  Diagnosis Date  . GERD (gastroesophageal reflux disease)   . Hx of lumbosacral spine surgery t  . Hyperlipidemia   . Hypertension   . Iliac artery aneurysm ([place])   . Lumbar spinal stenosis   . Polymyalgia rheumatica ([place]) 06/2016   Past Surgical History:  Past Surgical History:  Procedure Laterality Date  . JOINT REPLACEMENT  1992   right hip replacement  . JOINT REPLACEMENT  2002   left hip replacement  . ORIF ORBITAL FRACTURE Right 08/24/2020   Procedure: open reduction internal fixation of right orbital floor fracture;  Surgeon: Cindra Presume, MD;  Location: [place];  Service: Plastics;  Laterality: Right;  . ROTATOR CUFF REPAIR  2002   Left shoulder  . SPINE SURGERY  2006 & 2010   posterior spinal fusion L4-5 L5S1    Assessment & Plan Clinical Impression: Ahniya T. Friel is an 87 year old right-handed female with history of hypertension, hyperlipidemia, PMR, iliac artery aneurysm, bilateral hip replacements. Per chart review lives alone. Two-level home bed and bath main level 3 steps to entry. Independent with a walker. She has a son and granddaughter locally who can assist as needed. She has a daughter also in [place] who will be assisting for a short time on discharge. Presented 08/18/2020 after mechanical fall while walking to the bathroom when she slipped and struck her head on a dresser. Denied loss of consciousness. Denied any chest pain or shortness of breath  associated with the fall. Cranial CT scan showed small amount of subarachnoid hemorrhage of the anterior left frontal lobe. No other acute intracranial abnormality. No skull fracture. Patient sustained comminuted depressed right orbital floor fracture with fracture also involving the adjacent anterior inferior right medial orbital wall as well as right side exophthalmos. CT cervical spine negative for fracture. Patient sustained right wrist subtle nondisplaced fracture of the distal radial metaphysis with an intra-articular component. No dislocation. Admission chemistries unremarkable aside glucose 135. Neurosurgery follow-up for small traumatic SAH/ICH Dr. Christella Noa no surgical intervention and monitored. Follow-up plastic surgery Dr. Claudia Desanctis in regards to right orbital floor fracture and underwent reconstruction of orbital floor 08/24/2020 per Dr. Claudia Desanctis. Advised conservative care of right nondisplaced fracture of distal radius nonweightbearing with cock up splint and no current surgical intervention. Therapy evaluations completed due to patient decreased functional mobility was admitted for a comprehensive rehab program.  Patient transferred to CIR on 08/27/2020 .    Patient currently requires min with basic self-care skills secondary to muscle weakness, decreased cardiorespiratoy endurance and decreased standing balance and decreased balance strategies.  Prior to hospitalization, patient was fully independent with a rollator walker.  Patient will benefit from skilled intervention to increase independence with basic self-care skills and increase level of independence with iADL prior to discharge home independently.  Anticipate patient will require intermittent supervision and follow up home health.  OT - End of Session Activity Tolerance: Tolerates 30+ min activity with multiple rests Endurance Deficit: Yes Endurance Deficit Description: was tired after session OT Assessment Rehab Potential (ACUTE  ONLY):  Excellent OT Barriers to Discharge: Decreased caregiver support OT Barriers to Discharge Comments: pt states she will be home alone most of the time; family helps with errands OT Patient demonstrates impairments in the following area(s): Balance;Endurance;Pain OT Basic ADL's Functional Problem(s): Bathing;Dressing;Toileting;Grooming OT Advanced ADL's Functional Problem(s): Simple Meal Preparation;Light Housekeeping OT Transfers Functional Problem(s): Toilet;Tub/Shower OT Additional Impairment(s): None OT Plan OT Intensity: Minimum of 1-2 x/day, 45 to 90 minutes OT Frequency: 5 out of 7 days OT Duration/Estimated Length of Stay: 7-9 days OT Treatment/Interventions: Balance/vestibular training;Discharge planning;DME/adaptive equipment instruction;Functional mobility training;Therapeutic Activities;UE/LE Strength taining/ROM;Patient/family education;Self Care/advanced ADL retraining;Therapeutic Exercise OT Self Feeding Anticipated Outcome(s): independent OT Basic Self-Care Anticipated Outcome(s): Mod independent OT Toileting Anticipated Outcome(s): Mod I OT Bathroom Transfers Anticipated Outcome(s): MOd I OT Recommendation Patient destination: Home Follow Up Recommendations: Home health OT Equipment Recommended: To be determined   OT Evaluation Precautions/Restrictions  Precautions Precautions: Fall;Other (comment) Precaution Comments: NWB in RUE, can WB through elbow as tolerated. Required Braces or Orthoses: Splint/Cast Splint/Cast: R cockup wrist splint. Restrictions Other Position/Activity Restrictions: ok to WB through elbow on platform   Home Living/Prior [place] expects to be discharged to:: Private residence Living Arrangements: Alone Available Help at Discharge: Family,Available 24 hours/day Type of Home: House Home Access: Stairs to enter [employer] of Steps: 3 Entrance Stairs-Rails: Left Home Layout: One level Bathroom  Shower/Tub: Walk-in shower,Other (comment) Bathroom Toilet: Handicapped height Bathroom Accessibility: Yes Additional Comments: Pt's spouse died in 2022/05/28.  She has a son and grand daughter locally who assist as needed.  Lives With: Alone Prior Function Level of Independence: Independent with basic ADLs,Independent with homemaking with ambulation,Independent with gait,Independent with transfers,Requires assistive device for independence  Able to Take Stairs?: Yes Driving: No Vocation: Retired Comments: uses a Radiation protection practitioner in the house and a cane in the community.  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  Since the onset of the pandemic, she stopped driving and going out in the community.  She has a local son and grand daughter who provide transportation and get her groceries. Vision Baseline Vision/History: No visual deficits Patient Visual Report: No change from baseline Vision Assessment?: No apparent visual deficits Perception  Perception: Within Functional Limits Praxis Praxis: Intact Cognition Overall Cognitive Status: Within Functional Limits for tasks assessed Arousal/Alertness: Awake/alert Orientation Level: Person;Place;Situation Person: Oriented Place: Oriented Situation: Oriented Year: 2022 Month: May Day of Week: Correct Memory: Appears intact Immediate Memory Recall: Sock;Blue;Bed Memory Recall Sock: Without Cue Memory Recall Blue: Without Cue Memory Recall Bed: Without Cue Awareness: Appears intact Problem Solving: Appears intact Safety/Judgment: Appears intact Sensation Sensation Light Touch: Appears Intact Hot/Cold: Appears Intact Proprioception: Appears Intact Stereognosis: Appears Intact Coordination Gross Motor Movements are Fluid and Coordinated: No Fine Motor Movements are Fluid and Coordinated: No Coordination and Movement Description: limited by R side pain in shoulder and hip as she fell on her R side Motor  Motor Motor - Skilled  Clinical Observations: generalized motor weakness from pain  Trunk/Postural Assessment  Cervical Assessment Cervical Assessment: Within Functional Limits Thoracic Assessment Thoracic Assessment: Within Functional Limits Lumbar Assessment Lumbar Assessment: Within Functional Limits Postural Control Postural Control: Within Functional Limits  Balance Static Standing Balance Static Standing - Level of Assistance: 5: Stand by assistance (with support from [place]) Dynamic Standing Balance Dynamic Standing - Level of Assistance: 4: Min assist (with support from [place]) Extremity/Trunk Assessment RUE Assessment Active Range of Motion (AROM) Comments: WFL General Strength Comments: WFL except R  wrist in splint (NWB through wrist); R shoulder sore from fall LUE Assessment LUE Assessment: Within Functional Limits  Care Tool Care Tool Self Care Eating   Eating Assist Level: Set up assist    Oral Care    Oral Care Assist Level: Set up assist    Bathing   Body parts bathed by patient: Chest;Abdomen;Front perineal area;Buttocks;Right upper leg;Left upper leg;Face;Right arm Body parts bathed by helper: Right lower leg;Left lower leg;Left arm   Assist Level: Moderate Assistance - Patient 50 - 74%    Upper Body Dressing(including orthotics)   What is the patient wearing?: Bra;Button up shirt   Assist Level: Minimal Assistance - Patient > 75%    Lower Body Dressing (excluding footwear)   What is the patient wearing?: Underwear/pull up;Pants Assist for lower body dressing: Minimal Assistance - Patient > 75%    Putting on/Taking off footwear   What is the patient wearing?: Ted hose;Shoes Assist for footwear: Minimal Assistance - Patient > 75%       Care Tool Toileting Toileting activity   Assist for toileting: Moderate Assistance - Patient 50 - 74%     Care Tool Bed Mobility Roll left and right activity   Roll left and right assist level: Supervision/Verbal cueing    Sit to lying  activity   Sit to lying assist level: Supervision/Verbal cueing    Lying to sitting edge of bed activity   Lying to sitting edge of bed assist level: Supervision/Verbal cueing     Care Tool Transfers Sit to stand transfer   Sit to stand assist level: Minimal Assistance - Patient > 75%    Chair/bed transfer   Chair/bed transfer assist level: Minimal Assistance - Patient > 75%     Toilet transfer   Assist Level: Minimal Assistance - Patient > 75%     Care Tool Cognition Expression of Ideas and Wants Expression of Ideas and Wants: Without difficulty (complex and basic) - expresses complex messages without difficulty and with speech that is clear and easy to understand   Understanding Verbal and Non-Verbal Content Understanding Verbal and Non-Verbal Content: Understands (complex and basic) - clear comprehension without cues or repetitions   Memory/Recall Ability *first 3 days only Memory/Recall Ability *first 3 days only: Current season;Location of own room;Staff names and faces;That he or she is in a hospital/hospital unit    Refer to Care Plan for [place] 1   OT Short Term Goal 1 (Week 1): pt will be able to self cleanse post toileting with S. OT Short Term Goal 2 (Week 1): Pt will be able to pull pants over hips with S. OT Short Term Goal 3 (Week 1): Pt will be able to don bra and shirt S.  Recommendations for other services: None    Skilled Therapeutic Intervention  ADL ADL Eating: Set up Grooming: Setup Where Assessed-Grooming: Sitting at sink Upper Body Bathing: Minimal assistance Where Assessed-Upper Body Bathing: Sitting at sink Lower Body Bathing: Moderate assistance Where Assessed-Lower Body Bathing: Standing at sink;Sitting at sink Upper Body Dressing: Minimal assistance Where Assessed-Upper Body Dressing: Sitting at sink Lower Body Dressing: Moderate assistance Where Assessed-Lower Body Dressing: Sitting at sink;Standing at  sink Toileting: Minimal assistance Where Assessed-Toileting: Glass blower/designer: Psychiatric nurse Method: Counselling psychologist: Raised toilet seat Mobility  Transfers Sit to Stand: Minimal Assistance - Patient > 75% Stand to Sit: Minimal Assistance - Patient > 75%  Visit 1:  Pain: c/o  R sh pain - premedicated  Pt seen for initial evaluation and ADL training with a focus on adaptive strategies, balance and endurance. Her daughter was present and gave her a list of things to measure at the pt's house.  Had pt take a sponge bath at sink with plans to practice shower transfers in second session to prepare for a shower tomorrow. Pt participated well and was surprised she did as well as she did.  Discussed pt's goals, role of OT and POC. Pt has family in the area, but she will be home alone most of the time so she will need be quite independent. Pt resting in bed at end of session with all needs met. Alarm set.   Visit 2: Pain: minimal c/o groin pain - provided pt with a hot pack Pt seen this session (dtr present to observe) to practice shower stall transfers to prepare for a shower tomorrow. She was able to get in and out with only min A using PF RW as needed.   Ambulated back to sit EOB.  Practiced a few sit to stands with pushing up and reaching back with L hand only (not using RW) and pt able to do so with CGA.  She said her legs were very tired from earlier PT session. Spent the rest of this session working on various AROM exercises for maintenance of strength and general conditioning for endurance.   -shoulder rolls -torso twists in sitting then in supine -supine bridges - knee flexion into kicks toward ceiling -snow angels with legs.   Pt tolerated the exercises extremely well.  Resting in bed with all needs met.  Bed alarm set.    Discharge Criteria: Patient will be discharged from OT if patient refuses treatment 3 consecutive times without medical  reason, if treatment goals not met, if there is a change in medical status, if patient makes no progress towards goals or if patient is discharged from hospital.  The above assessment, treatment plan, treatment alternatives and goals were discussed and mutually agreed upon: by patient and by family  Layana Konkel 08/28/2020, 12:13 PM

## 2020-08-28 NOTE — Progress Notes (Signed)
[place] PHYSICAL MEDICINE & REHABILITATION PROGRESS NOTE  Subjective/Complaints: Patient seen standing up and sitting down, working with therapy this morning.  She states she slept very well overnight.  Daughter at bedside.  Patient is ready to begin therapies.  Discussed function and strength with therapies.  ROS: Denies CP, SOB, N/V/D  Objective: Vital Signs: Blood pressure 140/76, pulse 81, temperature 97.8 F (36.6 C), temperature source Oral, resp. rate 18, weight 74.5 kg, SpO2 99 %. No results found. Recent Labs    08/28/20 0452  WBC 5.6  HGB 13.6  HCT 42.0  PLT 326   Recent Labs    08/28/20 0452  NA 137  K 4.2  CL 105  CO2 24  GLUCOSE 115*  BUN 15  CREATININE 0.88  CALCIUM 9.6    Intake/Output Summary (Last 24 hours) at 08/28/2020 1231 Last data filed at 08/28/2020 5956 Gross per 24 hour  Intake 720 ml  Output --  Net 720 ml        Physical Exam: BP 140/76 (BP Location: Left Arm)   Pulse 81   Temp 97.8 F (36.6 C) (Oral)   Resp 18   Wt 74.5 kg   SpO2 99%   BMI 27.76 kg/m  Constitutional: No distress . Vital signs reviewed. HENT: Facial trauma. Right periorbital edema.. Eyes: EOMI. No discharge. Cardiovascular: No JVD.  RRR. Respiratory: Normal effort.  No stridor.  Bilateral clear to auscultation. GI: Non-distended.  BS +. Skin: Warm and dry.  Intact. Psych: Normal mood.  Normal behavior. Musc: Right wrist with edema and tenderness Neuro: Alert Makes eye contact with examiner.   Motor: LUE: 5/5 proximal distal Bilateral lower extremities: 4-4+/5 proximal distal Right upper extremity: Shoulder abduction 4+/5, distally limited due to splint   Assessment/Plan: 1. Functional deficits which require 3+ hours per day of interdisciplinary therapy in a comprehensive inpatient rehab setting.  Physiatrist is providing close team supervision and 24 hour management of active medical problems listed below.  Physiatrist and rehab team continue to  assess barriers to discharge/monitor patient progress toward functional and medical goals   Care Tool:  Bathing    Body parts bathed by patient: Chest,Abdomen,Front perineal area,Buttocks,Right upper leg,Left upper leg,Face,Right arm   Body parts bathed by helper: Right lower leg,Left lower leg,Left arm     Bathing assist Assist Level: Moderate Assistance - Patient 50 - 74%     Upper Body Dressing/Undressing Upper body dressing   What is the patient wearing?: Bra,Button up shirt    Upper body assist Assist Level: Minimal Assistance - Patient > 75%    Lower Body Dressing/Undressing Lower body dressing      What is the patient wearing?: Underwear/pull up,Pants     Lower body assist Assist for lower body dressing: Minimal Assistance - Patient > 75%     Toileting Toileting    Toileting assist Assist for toileting: Moderate Assistance - Patient 50 - 74%     Transfers Chair/bed transfer  Transfers assist     Chair/bed transfer assist level: Minimal Assistance - Patient > 75%     Locomotion Ambulation   Ambulation assist              Walk 10 feet activity   Assist           Walk 50 feet activity   Assist           Walk 150 feet activity   Assist  Walk 10 feet on uneven surface  activity   Assist           Wheelchair     Assist               Wheelchair 50 feet with 2 turns activity    Assist            Wheelchair 150 feet activity     Assist           Medical Problem List and Plan: 1.  Traumatic SAH/ICH, right orbital fracture as well as right wrist fracture secondary to mechanical fall 08/18/2020.  Status post reconstruction of orbital floor 08/24/2020  Begin CIR evaluations 2.  Antithrombotics: -DVT/anticoagulation: SCDs             -antiplatelet therapy: N/A 3. Post-surgical pain: Continue oxycodone as needed as well as Flexeril  Monitor with increased exertion 4. Mood: Provide  emotional support             -antipsychotic agents: N/A 5. Neuropsych: This patient is capable of making decisions on her own behalf. 6. Skin/Wound Care: Routine skin checks 7. Fluids/Electrolytes/Nutrition: Routine in and outs 8.  Right nondisplaced fracture of distal radius.    Nonweightbearing at the wrist.  Cock up splint.  Patient is using a platform walker.  No surgical intervention 9.  Hypertension.  Patient on Corgard 40 mg daily prior to admission stopped due to bradycardia.  Resume as needed.   Monitor with increased mobility 10.  GERD.  Protonix 11.  Slow transit constipation.  Senokot daily, Dulcolax tablet daily as needed  Improving 12. Right knee severe OA: continue voltaren gel.  13.  Hypoalbuminemia  Supplement initiated on 5/17   LOS: 1 days A FACE TO FACE EVALUATION WAS PERFORMED  Tim Corriher Lorie Phenix 08/28/2020, 12:31 PM

## 2020-08-28 NOTE — Progress Notes (Signed)
Inpatient Rehabilitation Center Individual Statement of Services  Patient Name:  Sydney [name]  Date:  08/28/2020  Welcome to the [facility].  Our goal is to provide you with an individualized program based on your diagnosis and situation, designed to meet your specific needs.  With this comprehensive rehabilitation program, you will be expected to participate in at least 3 hours of rehabilitation therapies Monday-Friday, with modified therapy programming on the weekends.  Your rehabilitation program will include the following services:  Physical Therapy (PT), Occupational Therapy (OT), 24 hour per day rehabilitation nursing, Neuropsychology, Care Coordinator, Rehabilitation Medicine, Nutrition Services and Pharmacy Services  Weekly team conferences will be held on Wednesday to discuss your progress.  Your Inpatient Rehabilitation Care Coordinator will talk with you frequently to get your input and to update you on team discussions.  Team conferences with you and your family in attendance may also be held.  Expected length of stay: 10 days  Overall anticipated outcome: mod/i level  Depending on your progress and recovery, your program may change. Your Inpatient Rehabilitation Care Coordinator will coordinate services and will keep you informed of any changes. Your Inpatient Rehabilitation Care Coordinator's name and contact numbers are listed  below.  The following services may also be recommended but are not provided by the [facility] will be made to provide these services after discharge if needed.  Arrangements include referral to agencies that provide these services.  Your insurance has been verified to be:  [facility] primary doctor is: Viviana Simpler   Pertinent information will be shared with your doctor and your  insurance company.  Inpatient Rehabilitation Care Coordinator:  Ovidio Kin, [place] or Emilia Beck  Information discussed with and copy given to patient by: Elease Hashimoto, 08/28/2020, 9:53 AM

## 2020-08-28 NOTE — Progress Notes (Signed)
Patient arrived on unit, oriented to unit. Reviewed medications, therapy schedule, rehab routine and plan of care. States an understanding of information reviewed. No complications noted at this time. Patient reports no pain and is AX4 Sydney [name] L Haeden Hudock  

## 2020-08-28 NOTE — Progress Notes (Signed)
Inpatient Rehabilitation  Patient information reviewed and entered into eRehab system by Caresse Sedivy M. Arbutus Nelligan, M.A., CCC/SLP, PPS Coordinator.  Information including medical coding, functional ability and quality indicators will be reviewed and updated through discharge.    

## 2020-08-28 NOTE — Evaluation (Signed)
Physical Therapy Assessment and Plan  Patient Details  Name: Sydney [name] MRN: [id number] Date of Birth: May 10, 1932  PT Diagnosis: Abnormality of gait, Difficulty walking, Muscle weakness and Pain in R hip and R knee Rehab Potential: Good ELOS: 10 days   Today's Date: 08/28/2020 PT Individual Time: 1330-1430 PT Individual Time Calculation (min): 60 min    Hospital Problem: Principal Problem:   Traumatic subarachnoid hemorrhage ([place]) Active Problems:   Hypoalbuminemia due to protein-calorie malnutrition ([place])   Slow transit constipation   Essential hypertension   Closed fracture of lower end of right radius with routine healing   Postoperative pain   Past Medical History:  Past Medical History:  Diagnosis Date  . GERD (gastroesophageal reflux disease)   . Hx of lumbosacral spine surgery t  . Hyperlipidemia   . Hypertension   . Iliac artery aneurysm ([place])   . Lumbar spinal stenosis   . Polymyalgia rheumatica ([place]) 06/2016   Past Surgical History:  Past Surgical History:  Procedure Laterality Date  . JOINT REPLACEMENT  1992   right hip replacement  . JOINT REPLACEMENT  2002   left hip replacement  . ORIF ORBITAL FRACTURE Right 08/24/2020   Procedure: open reduction internal fixation of right orbital floor fracture;  Surgeon: Cindra Presume, MD;  Location: [facility];  Service: Plastics;  Laterality: Right;  . ROTATOR CUFF REPAIR  2002   Left shoulder  . SPINE SURGERY  2006 & 2010   posterior spinal fusion L4-5 L5S1    Assessment & Plan Clinical Impression: Patient is an 87 year old right-handed female with history of hypertension, hyperlipidemia, PMR, iliac artery aneurysm, bilateral hip replacements.  Per chart review lives alone.  Two-level home bed and bath main level 3 steps to entry.  Independent with a walker.  She has a son and granddaughter locally who can assist as needed.  She has a daughter also in [place] who will be assisting for a short time on discharge.   Presented 08/18/2020 after mechanical fall while walking to the bathroom when she slipped and struck her head on a dresser.  Denied loss of consciousness.  Denied any chest pain or shortness of breath associated with the fall.  Cranial CT scan showed small amount of subarachnoid hemorrhage of the anterior left frontal lobe.  No other acute intracranial abnormality.  No skull fracture.  Patient sustained comminuted depressed right orbital floor fracture with fracture also involving the adjacent anterior inferior right medial orbital wall as well as right side exophthalmos.  CT cervical spine negative for fracture.  Patient sustained right wrist subtle nondisplaced fracture of the distal radial metaphysis with an intra-articular component.  No dislocation.  Admission chemistries unremarkable aside glucose 135.  Neurosurgery follow-up for small traumatic SAH/ICH Dr. Christella Noa no surgical intervention and monitored.  Follow-up plastic surgery Dr. Claudia Desanctis in regards to right orbital floor fracture and underwent reconstruction of orbital floor 08/24/2020 per Dr. Claudia Desanctis.  Advised conservative care of right nondisplaced fracture of distal radius nonweightbearing with cock up splint and no current surgical intervention.  Therapy evaluations completed due to patient decreased functional mobility was admitted for a comprehensive rehab program Patient transferred to CIR on 08/27/2020 .   Patient currently requires min with mobility secondary to muscle weakness, decreased cardiorespiratoy endurance, unbalanced muscle activation and decreased standing balance and decreased balance strategies.  Prior to hospitalization, patient was modified independent  with mobility and lived with Alone in a House home.  Home access is 3Stairs to enter.  Patient will benefit from skilled PT intervention to maximize safe functional mobility, minimize fall risk and decrease caregiver burden for planned discharge home with intermittent assist.  Anticipate  patient will benefit from follow up [facility] at discharge.  PT - End of Session Activity Tolerance: Tolerates 30+ min activity with multiple rests Endurance Deficit: Yes Endurance Deficit Description: Rest breaks needed for pain and fatigue PT Assessment Rehab Potential (ACUTE/IP ONLY): Good PT Barriers to Discharge: Decreased caregiver support;Lack of/limited family support;Insurance for SNF coverage;Weight bearing restrictions PT Patient demonstrates impairments in the following area(s): Balance;Edema;Endurance;Motor;Pain;Safety PT Transfers Functional Problem(s): Bed Mobility;Bed to Chair;Car PT Locomotion Functional Problem(s): Ambulation PT Plan PT Intensity: Minimum of 1-2 x/day ,45 to 90 minutes PT Frequency: 5 out of 7 days PT Duration Estimated Length of Stay: 10 days PT Treatment/Interventions: Ambulation/gait training;Discharge planning;Functional mobility training;Psychosocial support;Visual/perceptual remediation/compensation;Therapeutic Activities;Wheelchair propulsion/positioning;Therapeutic Exercise;Skin care/wound management;Neuromuscular re-education;Disease management/prevention;Balance/vestibular training;Cognitive remediation/compensation;DME/adaptive equipment instruction;Pain management;Splinting/orthotics;UE/LE Strength taining/ROM;UE/LE Coordination activities;Stair training;Patient/family education;Community reintegration PT Transfers Anticipated Outcome(s): mod I with LRAD PT Locomotion Anticipated Outcome(s): mod I with LRAD PT Recommendation Follow Up Recommendations: Home health PT Patient destination: Home Equipment Recommended: To be determined Equipment Details: Will likely need PFRW   PT Evaluation Precautions/Restrictions Precautions Precaution Comments: NWB in RUE, can WB through elbow as tolerated. Required Braces or Orthoses: Splint/Cast Splint/Cast: R cockup wrist splint. Restrictions Weight Bearing Restrictions: Yes RUE Weight Bearing: Weight bear  through elbow only Other Position/Activity Restrictions: ok to WB through elbow on platform   Home Living/Prior Functioning Home Living Available Help at Discharge: Family;Available PRN/intermittently Type of Home: House Home Access: Stairs to enter [employer] of Steps: 3 Entrance Stairs-Rails: Left Home Layout: One level Bathroom Shower/Tub: Walk-in shower;Other (comment) Bathroom Toilet: Handicapped height Bathroom Accessibility: Yes Additional Comments: Pt's spouse died in Jun 03, 2022.  She has a son and grand daughter locally who assist as needed.  Lives With: Alone Prior Function Level of Independence: Independent with basic ADLs;Independent with homemaking with ambulation;Independent with gait;Independent with transfers;Requires assistive device for independence  Able to Take Stairs?: Yes Driving: No Vocation: Retired Comments: uses a Radiation protection practitioner in the house and a cane in the community.  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  Since the onset of the pandemic, she stopped driving and going out in the community.  She has a local son and grand daughter who provide transportation and get her groceries. Vision/Perception  Perception Perception: Within Functional Limits Praxis Praxis: Intact  Cognition Overall Cognitive Status: Within Functional Limits for tasks assessed Arousal/Alertness: Awake/alert Orientation Level: Oriented X4 Memory: Appears intact Awareness: Appears intact Problem Solving: Appears intact Safety/Judgment: Appears intact Sensation Sensation Light Touch: Appears Intact Hot/Cold: Appears Intact Proprioception: Appears Intact Stereognosis: Appears Intact Coordination Gross Motor Movements are Fluid and Coordinated: No Fine Motor Movements are Fluid and Coordinated: No Coordination and Movement Description: limited by R side pain in shoulder and hip as she fell on her R side Motor  Motor Motor: Other (comment) Motor - Skilled  Clinical Observations: generalized motor weakness from pain   Trunk/Postural Assessment  Cervical Assessment Cervical Assessment: Within Functional Limits Thoracic Assessment Thoracic Assessment: Within Functional Limits Lumbar Assessment Lumbar Assessment: Within Functional Limits Postural Control Postural Control: Within Functional Limits  Balance Balance Balance Assessed: Yes Static Sitting Balance Static Sitting - Balance Support: Feet supported Static Sitting - Level of Assistance: 7: Independent Dynamic Sitting Balance Dynamic Sitting - Balance Support: Feet supported;During functional activity Dynamic Sitting - Level of Assistance: 5: Stand by assistance  Static Standing Balance Static Standing - Balance Support: No upper extremity supported Static Standing - Level of Assistance: 4: Min assist Dynamic Standing Balance Dynamic Standing - Balance Support: During functional activity;Bilateral upper extremity supported Dynamic Standing - Level of Assistance: 4: Min assist (PFRW) Extremity Assessment  RUE Assessment Active Range of Motion (AROM) Comments: WFL General Strength Comments: WFL except R wrist in splint (NWB through wrist); R shoulder sore from fall LUE Assessment LUE Assessment: Within Functional Limits RLE Assessment RLE Assessment: Exceptions to [facility] General Strength Comments: Limited by pain. Grossly 4-/5 LLE Assessment LLE Assessment: Within Functional Limits  Care Tool Care Tool Bed Mobility Roll left and right activity   Roll left and right assist level: Supervision/Verbal cueing    Sit to lying activity   Sit to lying assist level: Supervision/Verbal cueing    Lying to sitting edge of bed activity   Lying to sitting edge of bed assist level: Supervision/Verbal cueing     Care Tool Transfers Sit to stand transfer   Sit to stand assist level: Minimal Assistance - Patient > 75%    Chair/bed transfer   Chair/bed transfer assist level: Minimal  Assistance - Patient > 75%     Psychologist, counselling transfer activity did not occur: Safety/medical concerns (fatigue/pain)        Care Tool Locomotion Ambulation   Assist level: Minimal Assistance - Patient > 75% Assistive device: Walker-platform Max distance: 50  Walk 10 feet activity   Assist level: Minimal Assistance - Patient > 75% Assistive device: Walker-platform   Walk 50 feet with 2 turns activity   Assist level: Minimal Assistance - Patient > 75% Assistive device: Walker-platform  Walk 150 feet activity Walk 150 feet activity did not occur: Safety/medical concerns      Walk 10 feet on uneven surfaces activity Walk 10 feet on uneven surfaces activity did not occur: Safety/medical concerns      Stairs   Assist level: Minimal Assistance - Patient > 75% Stairs assistive device: 1 hand rail Max number of stairs: 4  Walk up/down 1 step activity   Walk up/down 1 step (curb) assist level: Minimal Assistance - Patient > 75% Walk up/down 1 step or curb assistive device: 1 hand rail    Walk up/down 4 steps activity Walk up/down 4 steps assist level: Minimal Assistance - Patient > 75% Walk up/down 4 steps assistive device: 1 hand rail  Walk up/down 12 steps activity Walk up/down 12 steps activity did not occur: Safety/medical concerns      Pick up small objects from floor Pick up small object from the floor (from standing position) activity did not occur: Safety/medical concerns      Wheelchair Will patient use wheelchair at discharge?: No   Wheelchair activity did not occur: N/A      Wheel 50 feet with 2 turns activity Wheelchair 50 feet with 2 turns activity did not occur: N/A    Wheel 150 feet activity Wheelchair 150 feet activity did not occur: N/A      Refer to Care Plan for Long Term Goals  SHORT TERM GOAL WEEK 1 PT Short Term Goal 1 (Week 1): STG = LTG due to ELOS  Recommendations for other services: None   Skilled Therapeutic  Intervention Mobility Bed Mobility Bed Mobility: Rolling Right;Rolling Left;Supine to Sit;Sit to Supine Rolling Right: Contact Guard/Touching assist Rolling Left: Minimal Assistance - Patient > 75% Supine to Sit: Contact Guard/Touching assist Sit  to Supine: Contact Guard/Touching assist Transfers Transfers: Sit to Stand;Stand to Sit;Stand Pivot Transfers Sit to Stand: Minimal Assistance - Patient > 75% Stand to Sit: Minimal Assistance - Patient > 75% Stand Pivot Transfers: Minimal Assistance - Patient > 75% Stand Pivot Transfer Details: Verbal cues for safe use of DME/AE;Verbal cues for sequencing;Verbal cues for gait pattern;Visual cues/gestures for sequencing;Verbal cues for technique;Manual facilitation for weight shifting;Verbal cues for precautions/safety;Visual cues/gestures for precautions/safety;Tactile cues for sequencing Transfer (Assistive device): None Locomotion  Gait Ambulation: Yes Gait Assistance: Minimal Assistance - Patient > 75%;Contact Guard/Touching assist Gait Distance (Feet): 50 Feet Assistive device: Right platform walker Gait Assistance Details: Verbal cues for gait pattern;Verbal cues for safe use of DME/AE;Verbal cues for sequencing;Verbal cues for technique;Verbal cues for precautions/safety;Tactile cues for posture;Tactile cues for weight shifting Gait Gait: Yes Gait Pattern: Impaired Gait Pattern: Step-to pattern;Decreased step length - left;Decreased stance time - right;Decreased dorsiflexion - right;Trunk flexed;Wide base of support Gait velocity: decreased Stairs / Additional Locomotion Stairs: Yes Stairs Assistance: Minimal Assistance - Patient > 75% Stair Management Technique: One rail Right Number of Stairs: 4 Height of Stairs: 6 Wheelchair Mobility Wheelchair Mobility: No  Skilled Intervention: Pt greeted supine in bed, daughter at bedside, pt consenting to PT tx. Pt pleasant and cooperative throughout session. Oriented x4. Strength  symmetrical throughout without focal neural deficits. RLE strength impacted by R groin/knee pain. Pt aware of RUE NWB precautions. Retrieved 18x18 w/c from DME closet. Initiated functional mobility as outlined above. Requires CGA for bed mobility, minA for sit<>stand and stand<>pivot transfers with no AD, was able to ambulate ~58f with minA and PFRW, and was able to navigate up/down x4 steps with minA and 1 hand rail on L. Functional mobility mostly impacted by RUE NWB precautions, R groin/knee pain with weight bearing, and decreased standing tolerance. Pt ended session supine in bed with needs in reach, bed alarm activated, made comfortable.  Instructed pt in results of PT evaluation as detailed above, PT POC, rehab potential, rehab goals, and discharge recommendations. Additionally discussed CIR's policies regarding fall safety and use of chair alarm and/or quick release belt. Pt verbalized understanding and in agreement. Will update pt's family members as they become available.   Discharge Criteria: Patient will be discharged from PT if patient refuses treatment 3 consecutive times without medical reason, if treatment goals not met, if there is a change in medical status, if patient makes no progress towards goals or if patient is discharged from hospital.  The above assessment, treatment plan, treatment alternatives and goals were discussed and mutually agreed upon: by patient  CAlger SimonsPT, DPT 08/28/2020, 2:31 PM

## 2020-08-29 DIAGNOSIS — K5901 Slow transit constipation: Secondary | ICD-10-CM | POA: Diagnosis not present

## 2020-08-29 DIAGNOSIS — G8918 Other acute postprocedural pain: Secondary | ICD-10-CM | POA: Diagnosis not present

## 2020-08-29 DIAGNOSIS — S066X0D Traumatic subarachnoid hemorrhage without loss of consciousness, subsequent encounter: Secondary | ICD-10-CM | POA: Diagnosis not present

## 2020-08-29 DIAGNOSIS — I10 Essential (primary) hypertension: Secondary | ICD-10-CM | POA: Diagnosis not present

## 2020-08-29 NOTE — Progress Notes (Signed)
[facility] PHYSICAL MEDICINE & REHABILITATION PROGRESS NOTE  Subjective/Complaints: Patient seen laying in bed this morning.  She states she slept well overnight.  She states he had good first day of therapy yesterday.  Daughter at bedside.  Patient is looking forward.  About discharge date today after team conference.  ROS: Denies CP, SOB, N/V/D  Objective: Vital Signs: Blood pressure (!) 141/76, pulse 83, temperature (!) 97.4 F (36.3 C), temperature source Oral, resp. rate 18, weight 74.5 kg, SpO2 98 %. No results found. Recent Labs    08/28/20 0452  WBC 5.6  HGB 13.6  HCT 42.0  PLT 326   Recent Labs    08/28/20 0452  NA 137  K 4.2  CL 105  CO2 24  GLUCOSE 115*  BUN 15  CREATININE 0.88  CALCIUM 9.6    Intake/Output Summary (Last 24 hours) at 08/29/2020 1037 Last data filed at 08/29/2020 0700 Gross per 24 hour  Intake 540 ml  Output --  Net 540 ml        Physical Exam: BP (!) 141/76 (BP Location: Left Arm)   Pulse 83   Temp (!) 97.4 F (36.3 C) (Oral)   Resp 18   Wt 74.5 kg   SpO2 98%   BMI 27.76 kg/m  Constitutional: No distress . Vital signs reviewed. HENT: Facial trauma.  Right periorbital edema. Eyes: EOMI. No discharge. Cardiovascular: No JVD.  RRR. Respiratory: Normal effort.  No stridor.  Bilateral clear to auscultation. GI: Non-distended.  BS +. Skin: Warm and dry.  Intact. Psych: Normal mood.  Normal behavior. Musc: Right wrist with edema and tenderness, stable Neuro: Alert  Makes eye contact with examiner.   Motor: LUE: 5/5 proximal distal Bilateral lower extremities: 4-4+/5 proximal distal Right upper extremity: Shoulder abduction 4+/5, distally limited due to splint, unchanged   Assessment/Plan: 1. Functional deficits which require 3+ hours per day of interdisciplinary therapy in a comprehensive inpatient rehab setting.  Physiatrist is providing close team supervision and 24 hour management of active medical problems listed  below.  Physiatrist and rehab team continue to assess barriers to discharge/monitor patient progress toward functional and medical goals   Care Tool:  Bathing    Body parts bathed by patient: Chest,Abdomen,Front perineal area,Buttocks,Right upper leg,Left upper leg,Face,Right arm   Body parts bathed by helper: Right lower leg,Left lower leg,Left arm     Bathing assist Assist Level: Moderate Assistance - Patient 50 - 74%     Upper Body Dressing/Undressing Upper body dressing   What is the patient wearing?: Bra,Button up shirt    Upper body assist Assist Level: Minimal Assistance - Patient > 75%    Lower Body Dressing/Undressing Lower body dressing      What is the patient wearing?: Underwear/pull up,Pants     Lower body assist Assist for lower body dressing: Supervision/Verbal cueing     Toileting Toileting    Toileting assist Assist for toileting: Moderate Assistance - Patient 50 - 74%     Transfers Chair/bed transfer  Transfers assist     Chair/bed transfer assist level: Minimal Assistance - Patient > 75%     Locomotion Ambulation   Ambulation assist      Assist level: Minimal Assistance - Patient > 75% Assistive device: Walker-platform Max distance: 50   Walk 10 feet activity   Assist     Assist level: Minimal Assistance - Patient > 75% Assistive device: Walker-platform   Walk 50 feet activity   Assist    Assist  level: Minimal Assistance - Patient > 75% Assistive device: Walker-platform    Walk 150 feet activity   Assist Walk 150 feet activity did not occur: Safety/medical concerns         Walk 10 feet on uneven surface  activity   Assist Walk 10 feet on uneven surfaces activity did not occur: Safety/medical concerns         Wheelchair     Assist Will patient use wheelchair at discharge?: No   Wheelchair activity did not occur: N/A         Wheelchair 50 feet with 2 turns activity    Assist     Wheelchair 50 feet with 2 turns activity did not occur: N/A       Wheelchair 150 feet activity     Assist  Wheelchair 150 feet activity did not occur: N/A        Medical Problem List and Plan: 1.  Traumatic SAH/ICH, right orbital fracture as well as right wrist fracture secondary to mechanical fall 08/18/2020.  Status post reconstruction of orbital floor 08/24/2020  Continue CIR  Team conference today to discuss current and goals and coordination of care, home and environmental barriers, and discharge planning with nursing, case manager, and therapies. Please see conference note from today as well.  2.  Antithrombotics: -DVT/anticoagulation: SCDs             -antiplatelet therapy: N/A 3. Post-surgical pain: Continue oxycodone as needed as well as Flexeril  Controlled with meds on 5/18  Monitor with increased exertion 4. Mood: Provide emotional support             -antipsychotic agents: N/A 5. Neuropsych: This patient is capable of making decisions on her own behalf. 6. Skin/Wound Care: Routine skin checks 7. Fluids/Electrolytes/Nutrition: Routine in and outs 8.  Right nondisplaced fracture of distal radius.    Nonweightbearing at the wrist.  Cock up splint.  Patient is using a platform walker.  No surgical intervention 9.  Hypertension.  Patient on Corgard 40 mg daily prior to admission stopped due to bradycardia.  Resume as needed.   Controlled on 5/18  Monitor with increased mobility 10.  GERD.  Protonix 11.  Slow transit constipation.  Senokot daily, Dulcolax tablet daily as needed  Improving 12. Right knee severe OA: continue voltaren gel.  13.  Hypoalbuminemia  Supplement initiated on 5/17   LOS: 2 days A FACE TO FACE EVALUATION WAS PERFORMED  Diamond Jentz Lorie Phenix 08/29/2020, 10:37 AM

## 2020-08-29 NOTE — Patient Care Conference (Signed)
Inpatient RehabilitationTeam Conference and Plan of Care Update Date: 08/29/2020   Time: 11:16 AM    Patient Name: Sydney [name]      Medical Record Number: [id number]  Date of Birth: 18-Apr-1932 Sex: Female         Room/Bed: 4W26C/4W26C-01 Payor Info: Payor: HU[employer]NA MEDICARE / Plan: HU[employer] MEDICARE CHOICE PPO / Product Type: *No Product type* /    Admit Date/Time:  08/27/2020  3:07 PM  Primary Diagnosis:  Traumatic subarachnoid hemorrhage [facility])  Hospital Problems: Principal Problem:   Traumatic subarachnoid hemorrhage ([place]) Active Problems:   Hypoalbuminemia due to protein-calorie malnutrition ([place])   Slow transit constipation   Essential hypertension   Closed fracture of lower end of right radius with routine healing   Postoperative pain    Expected Discharge Date: Expected Discharge Date: 09/05/20  Team Members Present: Physician leading conference: Dr. Delice Lesch Care Coodinator Present: Dorien Chihuahua, RN, BSN, CRRN;Becky Dupree, LCSW Nurse Present: Dorien Chihuahua, RN PT Present: Ginnie Smart, PT OT Present: Meriel Pica, OT SLP Present: Other (comment) PPS Coordinator present : Gunnar Fusi, SLP     Current Status/Progress Goal Weekly Team Focus  Bowel/Bladder   Continent of B/B LBM 05/16  remain continent with normal bowel pattern  toilet prn   Swallow/Nutrition/ Hydration             ADL's   min A overall  mod I overall  ADL training, functional mobility training, general strength, and pt education   Mobility   supervision bed mobility, minA transfers, minA giat 17ft with PFRW, minA 4 stairs  mod I  Functional transfers, gait training with LRAD, stair training, general strengthening and improving activity tolerance, pt education, continuous DC planning   Communication             Safety/Cognition/ Behavioral Observations            Pain   Pt states pain is 3-4/10 to right shoulder  states relief with tylenol and flexoril  pain <=2/10  assess pain  qshift and prn medicate as directed and reassess for relief   Skin   Bruises and healing scabs on right arm, orthobrace on, Right eye edema with healing sutures to periorbital area.  pt will be free of infection or skin breakdown  assess skin qshift and prn     Discharge Planning:  Home with daughter who is here from [facility] to assist her for a short time. pt doing well and making good progress. Her son and granddaughter will also assist   Team Discussion: BP controlled. Constipation addressed. Adapting to right UE limitations although SOB with activity and fatigues quickly.  Patient on target to meet rehab goals: yes, currently patient is supervision for bed mobility, CGA for transfers with a Platform-walker. Able to ambulate 100' CGA and min assist for 4 steps. Mod I goals set for discharge.  *See Care Plan and progress notes for long and short-term goals.   Revisions to Treatment Plan:   Teaching Needs: Safety, WB limitations, medications, skin care, etc.  Current Barriers to Discharge: Decreased caregiver support, Home enviroment access/layout and Wound care  Possible Resolutions to Barriers: Family education     Medical Summary Current Status: Traumatic SAH/ICH, right orbital fracture as well as right wrist fracture secondary to mechanical fall 08/18/2020.  Barriers to Discharge: Medical stability;Weight bearing restrictions   Possible Resolutions to [employer] Focus: Therapies, optimize BP meds, pain meds   Continued Need for Acute Rehabilitation Level of Care: The patient  requires daily medical management by a physician with specialized training in physical medicine and rehabilitation for the following reasons: Direction of a multidisciplinary physical rehabilitation program to maximize functional independence : Yes Medical management of patient stability for increased activity during participation in an intensive rehabilitation regime.: Yes Analysis of laboratory values  and/or radiology reports with any subsequent need for medication adjustment and/or medical intervention. : Yes   I attest that I was present, lead the team conference, and concur with the assessment and plan of the team.   Dorien Chihuahua B 08/29/2020, 1:43 PM

## 2020-08-29 NOTE — Progress Notes (Signed)
Physical Therapy Session Note  Patient Details  Name: Sydney [name] MRN: [id number] Date of Birth: 07/06/1932  Today's Date: 08/29/2020 PT Individual Time: 1000-1058 + 1300-1341 PT Individual Time Calculation (min): 58 min + 41 min  Short Term Goals: Week 1:  PT Short Term Goal 1 (Week 1): STG = LTG due to ELOS  Skilled Therapeutic Interventions/Progress Updates:     1st session: Handoff of care from OT at start of session, pt sitting in w/c sinkside combing her hair. Pt agreeable and motivated to participate in PT session. W/c transport for time management to main rehab gym. Completed stand>step>pivot transfer with minA and no AD from w/c to mat table. Instructed on "warm up" exercises - completed 1x10 sit<>stands with CGA and unweighted LAQ/hip marches to fatigue. Gait training 17f with CGA (fading to minA with fatigue) and PFRW. Introduced wide based quad cane to attempt gait with LRAD - ambulated ~162fwith minA and WBQC, increased unsteadiness and antalgic gait noted due to R hip pain. Pt also reports feeling more confident and comfortable with PFRW rather than unilateral device at this time - will continue efforts to progress to LRAD as able. Sit>supine on mat table with CGA. Completed the following supine there-ex: -2x10 knee-to-chest with 1.5# ankle weight -2x10 straight leg hip abduction with 1.5# ankle weight -2x10 bridges with limited glut clearance  Required minA for sit>supine for trunk elevation. Stand<>pivot transfer with minA and no AD to w/c and wheeled back to her room with totalA for energy conservation. Completed stand<>pivot transfer with CGA and use of bedrail to EOB. Sit>supine with supervision with HOB flat. Able to reposition in bed with cues. Bed alarm activated and family at bedside at end of session.   2nd session: Pt greeted supine in bed, sleeping soundly, daughter at bedside. Pt awakens easily to voice and agreeable to PT session. No reports of pain. Supine<>sit  with CGA with bed features. Stand<>pivot transfer with minA and no AD to w/c from EOB. Transported to main rehab gym for energy conservation. Gait 12576fith CGA and PFRW (fading to minA with fatigue) - cues for equal weight shift to the R, reducing reliance of PFRW with upright posture, and general safety awareness. Performed 3x10 step up/down with minA and 1 handrail on L to 6inch step, cues needed for stepping up with L foot and down backwards with R foot - pt would sometimes rest her R elbow on the R hand rail. Instructed on seated Kinetron at 35 cm/sec resistance while seated in w/c - completed 4mi56m 3 min (rest break) with an appropriate cadence and pt conversant throughout. Pt wheeled back to room for energy conservation and she remained seated in w/c with daughter at bedside, awaiting upcoming OT schedule. All needs met.   Therapy Documentation Precautions:  Precautions Precautions: Fall,Other (comment) Precaution Comments: NWB in RUE, can WB through elbow as tolerated. Required Braces or Orthoses: Splint/Cast Splint/Cast: R cockup wrist splint. Restrictions Weight Bearing Restrictions: Yes RUE Weight Bearing: Weight bear through elbow only Other Position/Activity Restrictions: ok to WB through elbow on platform General:     Therapy/Group: Individual Therapy  ChriAlger Simons8/2022, 7:37 AM

## 2020-08-29 NOTE — Progress Notes (Signed)
Occupational Therapy Session Note  Patient Details  Name: Sydney [name] MRN: [id number] Date of Birth: 01-May-1932  Today's Date: 08/29/2020 OT Individual Time: 0915-1000 OT Individual Time Calculation (min): 45 min    Short Term Goals: Week 1:  OT Short Term Goal 1 (Week 1): pt will be able to self cleanse post toileting with S. OT Short Term Goal 2 (Week 1): Pt will be able to pull pants over hips with S. OT Short Term Goal 3 (Week 1): Pt will be able to don bra and shirt S.  Skilled Therapeutic Interventions/Progress Updates:      Pt seen for BADL retraining of toileting, bathing, and dressing with a focus on balance and adaptive strategies.  Pt did extremely well today but often needs encouragement.  She will often say "I need help to pull up my pants" but with cues she was able to do so.  She was surprised and pleased with how much she was able to accomplish today.  See ADL documentation below. Overall pt is CGA to min A working toward her S goals.  PT arrived for next session.     Therapy Documentation Precautions:  Precautions Precautions: Fall,Other (comment) Precaution Comments: NWB in RUE, can WB through elbow as tolerated. Required Braces or Orthoses: Splint/Cast Splint/Cast: R cockup wrist splint. Restrictions Weight Bearing Restrictions: Yes RUE Weight Bearing: Weight bear through elbow only Other Position/Activity Restrictions: ok to WB through elbow on platform  Pain: R knee pain, voltaren gel applied   ADL: ADL Eating: Set up Grooming: Setup Where Assessed-Grooming: Sitting at sink Upper Body Bathing: Supervision/safety (long sponge to reach L arm) Where Assessed-Upper Body Bathing: Shower Lower Body Bathing: Contact guard Where Assessed-Lower Body Bathing: Shower (long sponge) Upper Body Dressing: Minimal assistance Where Assessed-Upper Body Dressing: Edge of bed Lower Body Dressing: Contact guard Where Assessed-Lower Body Dressing: Edge of bed Toileting:  Minimal assistance Where Assessed-Toileting: Glass blower/designer: Psychiatric nurse Method: Counselling psychologist: Raised toilet seat Social research officer, government: Environmental education officer Method: Radiographer, therapeutic: Transfer tub bench,Grab bars   Therapy/Group: Individual Therapy  [place] 08/29/2020, 11:30 AM

## 2020-08-29 NOTE — Progress Notes (Signed)
Patient ID: Sydney [name], female   DOB: 07/24/1932, 88 y.o.   MRN: [id number] Met with pt and daughter who was present in her room to discuss team conference goals mod/i and target discharge date 5/25. Discussed therapy team felt it would be good for someone to be with her for a few days at discharge for transition home from the hospital. Daughter expressed her plan is to return home to [place] on Sunday. Pt reports both her granddaughter and grandson are moving in with her so someone will be there with her at discharge. She is doing very well in therapies. She will have family member bring in her walker from home so can order platform attachment and place on it.  

## 2020-08-29 NOTE — Progress Notes (Signed)
Occupational Therapy Session Note  Patient Details  Name: Sydney [name] MRN: [id number] Date of Birth: Aug 19, 1932  Today's Date: 08/29/2020 OT Individual Time: 1345-1413 OT Individual Time Calculation (min): 28 min    Short Term Goals: Week 1:  OT Short Term Goal 1 (Week 1): pt will be able to self cleanse post toileting with S. OT Short Term Goal 2 (Week 1): Pt will be able to pull pants over hips with S. OT Short Term Goal 3 (Week 1): Pt will be able to don bra and shirt S.  Skilled Therapeutic Interventions/Progress Updates:    Treatment session with focus on functional transfers and dynamic standing balance.  Pt received upright in w/c with daughter present.  Pt completed transfers to 28" high bed to simulate home bed height.  Pt able to complete transfers with CGA with RW and supervision in and out of bed.  Pt ambulated to toilet and completed transfer to standard 18" toilet height requiring mod assist to stand from lower height.  Pt reports having BSC at home that can be placed over toilet seat for improved ease.  Pt reports need to toilet.  Therapist placed BSC over toilet for toileting.  Pt doffed pants prior to toileting, however requested assist with hygiene due to decreased coordination with non-dominant hand.  Pt required assistance for clothing management.  Pt ambulated with PFRW back to bed and requested to lie down in bed to rest before final therapy of the day.  Pt remained semi-reclined in bed with all needs in reach.  Therapy Documentation Precautions:  Precautions Precautions: Fall,Other (comment) Precaution Comments: NWB in RUE, can WB through elbow as tolerated. Required Braces or Orthoses: Splint/Cast Splint/Cast: R cockup wrist splint. Restrictions Weight Bearing Restrictions: Yes RUE Weight Bearing: Weight bear through elbow only Other Position/Activity Restrictions: ok to WB through elbow on platform General:   Vital Signs: Therapy Vitals Temp: 97.6 F (36.4  C) Temp Source: Oral Pulse Rate: 99 Resp: 18 BP: 130/85 Patient Position (if appropriate): Lying Oxygen Therapy SpO2: 97 % O2 Device: Room Air Pain:  Pt with no c/o pain   Therapy/Group: Individual Therapy  Simonne Come 08/29/2020, 3:43 PM

## 2020-08-29 NOTE — Progress Notes (Signed)
Occupational Therapy Session Note  Patient Details  Name: Sydney [name] MRN: [id number] Date of Birth: 1932-12-02  Today's Date: 08/29/2020 OT Individual Time: 1500-1530 OT Individual Time Calculation (min): 30 min    Short Term Goals: Week 1:  OT Short Term Goal 1 (Week 1): pt will be able to self cleanse post toileting with S. OT Short Term Goal 2 (Week 1): Pt will be able to pull pants over hips with S. OT Short Term Goal 3 (Week 1): Pt will be able to don bra and shirt S.  Skilled Therapeutic Interventions/Progress Updates:    Pt received in bathroom with nurse tech, daughter at bedside and consented to OT tx. Pt seen for brainstorming and simulation of home shower transfer to increase safety upon discharge home. Simulated similar shower step and grab bars with cuing for proper platform walker placement for getting in and out. Pt req min A for step over transfer as she is unable to hold onto grab bars or walker with R hand 2/2 WB restrictions. Daughter plans to take picture of home shower and bring in to continue shower transfer training. Pt walks around room with PFRW and CGA, requires cuing to not weight bear through R wrist during transitions. Pt reports she is exhausted from all other therapy today, last 10 minutes of session utilized to instruct in BUE strengthening HEP with no weight in R hand and 2# db in L hand. Instructed in elbow flexion, shoulder press, and shoulder flexion all for 3x10 with min cuing for proper tech with good carryover. After tx, pt helped back to bed and left with daughter at bedside with all needs met.   Therapy Documentation Precautions:  Precautions Precautions: Fall,Other (comment) Precaution Comments: NWB in RUE, can WB through elbow as tolerated. Required Braces or Orthoses: Splint/Cast Splint/Cast: R cockup wrist splint. Restrictions Weight Bearing Restrictions: Yes RUE Weight Bearing: Weight bear through elbow only Other Position/Activity  Restrictions: ok to WB through elbow on platform Vital Signs: Therapy Vitals Temp: 97.6 F (36.4 C) Temp Source: Oral Pulse Rate: 99 Resp: 18 BP: 130/85 Patient Position (if appropriate): Lying Oxygen Therapy SpO2: 97 % O2 Device: Room Air Pain: none     Therapy/Group: Individual Therapy  [name] 08/29/2020, 3:34 PM

## 2020-08-29 NOTE — Progress Notes (Signed)
87 year old female status postop from open orbital floor repair with Dr. Claudia Desanctis on 08/24/2020.  Patient resting in bed on evaluation today.  Family at bedside.  Patient is in good spirits today on evaluation, she does not report any pain to her eye or face.  She feels as if the swelling and bruising has improved.  She reports that her vision feels normal and she does not notice any issues with movement of her eye.  She denies any double vision.  On exam her facial incision is intact.  She has bruising which is improving.  Proptosis has significantly improved.  No erythema noted  Recommend continuing with bacitracin or Vaseline to the incision twice daily.  Recommend continue to elevate head of bed and ice as needed to reduce swelling.  Patient is stable for discharge from plastic surgery stance.  We will continue to follow.  Patient has been notified to follow-up in our office 1 week after discharge.  Please call with questions or concerns.

## 2020-08-30 ENCOUNTER — Encounter: Payer: Medicare PPO | Admitting: Plastic Surgery

## 2020-08-30 DIAGNOSIS — I10 Essential (primary) hypertension: Secondary | ICD-10-CM | POA: Diagnosis not present

## 2020-08-30 DIAGNOSIS — G8918 Other acute postprocedural pain: Secondary | ICD-10-CM | POA: Diagnosis not present

## 2020-08-30 DIAGNOSIS — K5901 Slow transit constipation: Secondary | ICD-10-CM | POA: Diagnosis not present

## 2020-08-30 DIAGNOSIS — S066X0D Traumatic subarachnoid hemorrhage without loss of consciousness, subsequent encounter: Secondary | ICD-10-CM | POA: Diagnosis not present

## 2020-08-30 NOTE — Progress Notes (Signed)
[place] PHYSICAL MEDICINE & REHABILITATION PROGRESS NOTE  Subjective/Complaints: Patient seen laying in bed this AM.  She states she slept well overnight.  She is looking forward to discharge relatively soon.   ROS: Denies CP, SOB, N/V/D  Objective: Vital Signs: Blood pressure 138/66, pulse 90, temperature 98.3 F (36.8 C), temperature source Oral, resp. rate 16, weight 74.5 kg, SpO2 97 %. No results found. Recent Labs    08/28/20 0452  WBC 5.6  HGB 13.6  HCT 42.0  PLT 326   Recent Labs    08/28/20 0452  NA 137  K 4.2  CL 105  CO2 24  GLUCOSE 115*  BUN 15  CREATININE 0.88  CALCIUM 9.6    Intake/Output Summary (Last 24 hours) at 08/30/2020 1624 Last data filed at 08/30/2020 1330 Gross per 24 hour  Intake 720 ml  Output --  Net 720 ml        Physical Exam: BP 138/66 (BP Location: Left Arm)   Pulse 90   Temp 98.3 F (36.8 C) (Oral)   Resp 16   Wt 74.5 kg   SpO2 97%   BMI 27.76 kg/m  Constitutional: No distress . Vital signs reviewed. HENT: Facial trauma. Right periorbital edema.  Eyes: EOMI. No discharge. Cardiovascular: No JVD.  RRR. Respiratory: Normal effort.  No stridor.  Bilateral clear to auscultation. GI: Non-distended.  BS +. Skin: Warm and dry.  Intact. Psych: Normal mood.  Normal behavior. Musc: Right wrist with edema and tenderness, unchanged Neuro: Alert  Makes eye contact with examiner.   Motor: LUE: 5/5 proximal distal Bilateral lower extremities: 4-4+/5 proximal distal Right upper extremity: Shoulder abduction 4+/5, distally limited due to splint, stable  Assessment/Plan: 1. Functional deficits which require 3+ hours per day of interdisciplinary therapy in a comprehensive inpatient rehab setting.  Physiatrist is providing close team supervision and 24 hour management of active medical problems listed below.  Physiatrist and rehab team continue to assess barriers to discharge/monitor patient progress toward functional and medical  goals   Care Tool:  Bathing    Body parts bathed by patient: Chest,Abdomen,Front perineal area,Buttocks,Right upper leg,Left upper leg,Face,Right arm,Left arm (sponge bath today)   Body parts bathed by helper: Right lower leg,Left lower leg,Left arm     Bathing assist Assist Level: Supervision/Verbal cueing     Upper Body Dressing/Undressing Upper body dressing   What is the patient wearing?: Bra,Pull over shirt    Upper body assist Assist Level: Supervision/Verbal cueing    Lower Body Dressing/Undressing Lower body dressing      What is the patient wearing?: Underwear/pull up,Pants     Lower body assist Assist for lower body dressing: Contact Guard/Touching assist     Toileting Toileting    Toileting assist Assist for toileting: Contact Guard/Touching assist     Transfers Chair/bed transfer  Transfers assist     Chair/bed transfer assist level: Contact Guard/Touching assist     Locomotion Ambulation   Ambulation assist      Assist level: Minimal Assistance - Patient > 75% Assistive device: Walker-platform Max distance: 50   Walk 10 feet activity   Assist     Assist level: Minimal Assistance - Patient > 75% Assistive device: Walker-platform   Walk 50 feet activity   Assist    Assist level: Minimal Assistance - Patient > 75% Assistive device: Walker-platform    Walk 150 feet activity   Assist Walk 150 feet activity did not occur: Safety/medical concerns  Walk 10 feet on uneven surface  activity   Assist Walk 10 feet on uneven surfaces activity did not occur: Safety/medical concerns         Wheelchair     Assist Will patient use wheelchair at discharge?: No   Wheelchair activity did not occur: N/A         Wheelchair 50 feet with 2 turns activity    Assist    Wheelchair 50 feet with 2 turns activity did not occur: N/A       Wheelchair 150 feet activity     Assist  Wheelchair 150 feet  activity did not occur: N/A        Medical Problem List and Plan: 1.  Traumatic SAH/ICH, right orbital fracture as well as right wrist fracture secondary to mechanical fall 08/18/2020.  Status post reconstruction of orbital floor 08/24/2020  Continue CIR 2.  Antithrombotics: -DVT/anticoagulation: SCDs             -antiplatelet therapy: N/A 3. Post-surgical pain: Continue oxycodone as needed as well as Flexeril  Controlled with meds on 5/19  Monitor with increased exertion 4. Mood: Provide emotional support             -antipsychotic agents: N/A 5. Neuropsych: This patient is capable of making decisions on her own behalf. 6. Skin/Wound Care: Routine skin checks 7. Fluids/Electrolytes/Nutrition: Routine in and outs 8.  Right nondisplaced fracture of distal radius.    Nonweightbearing at the wrist.  Cock up splint.  Patient is using a platform walker.  No surgical intervention 9.  Hypertension.  Patient on Corgard 40 mg daily prior to admission stopped due to bradycardia.  Resume as needed.   Relatively controlled on 5/19  Monitor with increased mobility 10.  GERD.  Protonix 11.  Slow transit constipation.  Senokot daily, Dulcolax tablet daily as needed  Improving 12. Right knee severe OA: continue voltaren gel.  13.  Hypoalbuminemia  Supplement initiated on 5/17   LOS: 3 days A FACE TO FACE EVALUATION WAS PERFORMED  Sydney [name] Sydney [name] 08/30/2020, 4:24 PM

## 2020-08-30 NOTE — Discharge Instructions (Signed)
Inpatient Rehab Discharge Instructions  Sydney [name] Discharge date and time: No discharge date for patient encounter.   Activities/Precautions/ Functional Status: Activity: Nonweightbearing right wrist patient may use a platform rolling walker Diet: regular diet Wound Care: Routine skin checks Functional status:  ___ No restrictions     ___ Walk up steps independently ___ 24/7 supervision/assistance   ___ Walk up steps with assistance ___ Intermittent supervision/assistance  ___ Bathe/dress independently ___ Walk with walker     _x__ Bathe/dress with assistance ___ Walk Independently    ___ Shower independently ___ Walk with assistance    ___ Shower with assistance ___ No alcohol     ___ Return to work/school ________  Special Instructions: No driving smoking or alcohol    COMMUNITY REFERRALS UPON DISCHARGE:    Home Health:   PT & OT                  Agency: [facility]  [place]                                                 Agency/Supplier:[facility]  [phone]   My questions have been answered and I understand these instructions. I will adhere to these goals and the provided educational materials after my discharge from the hospital.  Patient/Caregiver Signature _______________________________ Date __________  Clinician Signature _______________________________________ Date __________  Please bring this form and your medication list with you to all your follow-up doctor's appointments. Inpatient Rehab Discharge Instructions  Sydney [name] Discharge date and time: No discharge date for patient encounter.   Activities/Precautions/ Functional Status: Activity: non weight bearing right wrist  Diet: regular Wound Care: routine skin checks Functional status:  ___ No restrictions     ___ Walk up steps independently ___ 24/7 supervision/assistance   ___ Walk up steps with  assistance ___ Intermittent supervision/assistance  ___ Bathe/dress independently ___ Walk with walker     _x__ Bathe/dress with assistance ___ Walk Independently    ___ Shower independently ___ Walk with assistance    ___ Shower with assistance ___ No alcohol     ___ Return to work/school ________  Special Instructions: No driving smoking or alcohol   My questions have been answered and I understand these instructions. I will adhere to these goals and the provided educational materials after my discharge from the hospital.  Patient/Caregiver Signature _______________________________ Date __________  Clinician Signature _______________________________________ Date __________  Please bring this form and your medication list with you to all your follow-up doctor's appointments.

## 2020-08-30 NOTE — Progress Notes (Signed)
Occupational Therapy Session Note  Patient Details  Name: Sydney [name] MRN: [id number] Date of Birth: 1932-05-18  Today's Date: 08/30/2020 OT Individual Time: 2703-5009 OT Individual Time Calculation (min): 60 min    Short Term Goals: Week 1:  OT Short Term Goal 1 (Week 1): pt will be able to self cleanse post toileting with S. OT Short Term Goal 2 (Week 1): Pt will be able to pull pants over hips with S. OT Short Term Goal 3 (Week 1): Pt will be able to don bra and shirt S.  Skilled Therapeutic Interventions/Progress Updates:      Pt seen for BADL retraining of toileting, bathing, and dressing with a focus on adaptive strategies. Pt was able to sit up from flat bed without rails with S. Used PFRW to ambulate to bathroom with CGA.  Continues to need cues to push up from sitting surface with L hand then reaching for walker and to then remove hands and reach back with L hand prior to sitting. Pt is strong and stable enough for sit to stands without walker. \ In bathroom, did well using L hand for clothing management and cleansing. Sat in wc at sink for sponge bath and then dressed. Used pullover technique to don her bra today and that worked well. 1 cue to don R leg in pants first.  Overall, much improved indep with self care.   Sat to EOB for B shoulder AROM exercises with sh abd to 40 degrees (not higher as pt compensated for sh pain with elevation hike), adding in small rolls for shoulders and torso twists.  Moved to supine with S and then worked on bent knee sways and small hip bridges.   Pt resting in bed prior to PT session. Bed alarm on.   Therapy Documentation Precautions:  Precautions Precautions: Fall,Other (comment) Precaution Comments: NWB in RUE, can WB through elbow as tolerated. Required Braces or Orthoses: Splint/Cast Splint/Cast: R cockup wrist splint. Restrictions Weight Bearing Restrictions: Yes RUE Weight Bearing: Weight bear through elbow only Other  Position/Activity Restrictions: ok to WB through elbow on platform  Pain: Pain Assessment Pain Score: 0-No pain ADL: ADL Eating: Set up Grooming: Setup Where Assessed-Grooming: Sitting at sink Upper Body Bathing: Supervision/safety (long sponge to reach L arm) Where Assessed-Upper Body Bathing: Shower Lower Body Bathing: Contact guard Where Assessed-Lower Body Bathing: Shower (long sponge) Upper Body Dressing: Supervision/safety Where Assessed-Upper Body Dressing: Edge of bed Lower Body Dressing: Contact guard Where Assessed-Lower Body Dressing: Edge of bed Toileting: Contact guard Where Assessed-Toileting: Glass blower/designer: Therapist, music Method: Counselling psychologist: Raised Counselling psychologist: Environmental education officer Method: Radiographer, therapeutic: Transfer tub bench,Grab bars   Therapy/Group: Individual Therapy  [place] 08/30/2020, 9:20 AM

## 2020-08-30 NOTE — Progress Notes (Signed)
Physical Therapy Session Note  Patient Details  Name: Sydney [name] MRN: [id number] Date of Birth: [birth year]  Today's Date: 08/30/2020 PT Individual Time: 1000-1112 + 1430-1425 PT Individual Time Calculation (min): 72 min  + 55 min  Short Term Goals: Week 1:  PT Short Term Goal 1 (Week 1): STG = LTG due to ELOS  Skilled Therapeutic Interventions/Progress Updates:     1st session: Pt received supine in bed, agreeable to PT session. Reports mild R hip pain, rest breaks and mobility provided for pain management. Supine<>sit completed with supervision - cues for NWB through R hand. Able to sit unsupported with supervision. Donned slip-on shoes with setupA and performed stand<>pivot transfer with minA and no AD to w/c. W/c transport for energy conservation to main rehab gym and performed similar transfer to mat table. Completed repeated, 1x10, sit<>stands with CGA (progressing to supervision) and no AD from mat table height. Also completed LAQ and hip marches bilaterally with 3# ankle weight. Instructed and completed BERG balance test, scored 30/56, indicating increased falls risk. See below for details of test. Rest breaks needed throughout BERG for recovery. Educated on BERG score and her high falls risk - reminded her to always be using a AD when ambulating and she voiced understanding.   Patient demonstrates increased fall risk as noted by score of 30/56 on Berg Balance Scale.  (<36= high risk for falls, close to 100%; 37-45 significant >80%; 46-51 moderate >50%; 52-55 lower >25%)  Stand<>pivot with minA to w/c with cues for sequencing and safety approach. Instructed on rules and purpose of TUG. Completed TUG with CGA and PFRW Trial1) 30 sec Trial2) 27 sec Trial3) 26 sec  Tug score > 13.5 seconds indicates increased falls risk.  Pt then completed dyanmic standing with unewighted ball toss with +2 assist for guarding. Ball toss in multi plane directions working on righting response,  functional reaching outside cone of stability, and balance. Gait training 161ft with CGA (fading to minA with fatigue) and PFRW - antalgic gait due to R hip pain and fading from step-through pattern to step-to pattern. 54meter gait speed 0.45 m/s, indicative of limited community ambulator.   Pt returned to room in w/c and performed stand<>pivot transfer with CGA and use of bedrail. Able to complete sit>supine with supervision with bed features. Remained supine with bed alarm on and needs in reach at end of session.  2nd session: Pt greeted supine in bed, daughter at bedside. Pt agreeable to PT tx. Denies pain. Pt requesting a light activity session due to fatigue from busy day of therapies. Supine<>sit with supervision with bed flat. Donned slip-on shoes with minA while seated EOB. Completed stand<>pivot transfer with CGA and no AD to w/c and wheeled to main rehab gym for time management. Focus of session to review stair training as pt has 3 STE with 1 HR on L. Reviewed sequencing (stepping up with L foot leading ascent and stepping down with R foot leading descent) - pt able to perform teach-back to ensure carryover. Pt completed 3x4 steps (seated rest) with 1 HR on L, requiring heavy minA for boosting up steps. Pts daughter entering therapy gym during stair training and was able to witness her ability and level of assist needed to navigate stairs. Lengthy discussion held with both pt and daughter regarding DC planning and safe entrance into home - plan on having a grandson come in for family training for stairs. Also discussed shower entry - will share with OT to assist with  planning. Pt returned to room at end of session in w/c, completed stand<>pivot with CGA and use of bed rail back to bed. Able to sit>supine with supervision. Remained supine in bed with needs in reach, bed alarm on, pt made comfortable.  Therapy Documentation Precautions:  Precautions Precautions: Fall,Other (comment) Precaution  Comments: NWB in RUE, can WB through elbow as tolerated. Required Braces or Orthoses: Splint/Cast Splint/Cast: R cockup wrist splint. Restrictions Weight Bearing Restrictions: Yes RUE Weight Bearing: Weight bear through elbow only Other Position/Activity Restrictions: ok to WB through elbow on platform  Balance Balance Assessed: Yes Standardized Balance Assessment Standardized Balance Assessment: Berg Balance Test Berg Balance Test Sit to Stand: Able to stand  independently using hands Standing Unsupported: Able to stand safely 2 minutes Sitting with Back Unsupported but Feet Supported on Floor or Stool: Able to sit safely and securely 2 minutes Stand to Sit: Sits safely with minimal use of hands Transfers: Needs one person to assist Standing Unsupported with Eyes Closed: Able to stand 10 seconds with supervision Standing Ubsupported with Feet Together: Able to place feet together independently and stand for 1 minute with supervision From Standing, Reach Forward with Outstretched Arm: Can reach forward >5 cm safely (2") From Standing Position, Pick up Object from Floor: Able to pick up shoe, needs supervision From Standing Position, Turn to Look Behind Over each Shoulder: Looks behind one side only/other side shows less weight shift Turn 360 Degrees: Needs assistance while turning Standing Unsupported, Alternately Place Feet on Step/Stool: Needs assistance to keep from falling or unable to try Standing Unsupported, One Foot in Front: Loses balance while stepping or standing Standing on One Leg: Unable to try or needs assist to prevent fall Total Score: 30/56 Therapy/Group: Individual Therapy  Alger Simons 08/30/2020, 7:42 AM

## 2020-08-31 DIAGNOSIS — I10 Essential (primary) hypertension: Secondary | ICD-10-CM | POA: Diagnosis not present

## 2020-08-31 DIAGNOSIS — G8918 Other acute postprocedural pain: Secondary | ICD-10-CM | POA: Diagnosis not present

## 2020-08-31 DIAGNOSIS — S066X0D Traumatic subarachnoid hemorrhage without loss of consciousness, subsequent encounter: Secondary | ICD-10-CM | POA: Diagnosis not present

## 2020-08-31 DIAGNOSIS — K5901 Slow transit constipation: Secondary | ICD-10-CM | POA: Diagnosis not present

## 2020-08-31 MED ORDER — NADOLOL 20 MG PO TABS
20.0000 mg | ORAL_TABLET | Freq: Every day | ORAL | Status: DC
  Administered 2020-08-31 – 2020-09-05 (×6): 20 mg via ORAL
  Filled 2020-08-31 (×6): qty 1

## 2020-08-31 NOTE — Progress Notes (Signed)
Occupational Therapy Session Note  Patient Details  Name: Sydney [name] MRN: [id number] Date of Birth: 05-15-1932  Today's Date: 08/31/2020 OT Individual Time: 0930-1030 OT Individual Time Calculation (min): 60 min    Short Term Goals: STGs = LTGs  Skilled Therapeutic Interventions/Progress Updates:    Pt seen this session for BADL training, pt requested to wash up at sink vs shower today.  Overall CGA to S with self care. Pt's dtr provided measurements and photos of home bathroom. Set up ADL apt bathroom to replicate her set up and had pt practice ambulating in and out of bathroom with PFRW and stepping backwards into shower stall.  Pt can do this with S.  Told pt and her dtr that I changed her LTG of shower stall transfers from Mod I to sup as she has to step back wards and there are glass doors.  Recommend she only try this with the [place] first then get supervision from her granddtr.  Pt taken back to room and resting in chair with all needs met.   Therapy Documentation Precautions:  Precautions Precautions: Fall,Other (comment) Precaution Comments: NWB in RUE, can WB through elbow as tolerated. Required Braces or Orthoses: Splint/Cast Splint/Cast: R cockup wrist splint. Restrictions Weight Bearing Restrictions: Yes RUE Weight Bearing: Weight bearing as tolerated Other Position/Activity Restrictions: ok to WB through elbow on platform  Pain: Pain Assessment Pain Score: 0-No pain ADL: ADL Eating: Set up Grooming: Setup Where Assessed-Grooming: Sitting at sink Upper Body Bathing: Supervision/safety (long sponge to reach L arm) Where Assessed-Upper Body Bathing: Shower Lower Body Bathing: Contact guard Where Assessed-Lower Body Bathing: Shower (long sponge) Upper Body Dressing: Supervision/safety Where Assessed-Upper Body Dressing: Edge of bed Lower Body Dressing: Contact guard Where Assessed-Lower Body Dressing: Edge of bed Toileting: Contact guard Where  Assessed-Toileting: Glass blower/designer: Therapist, music Method: Counselling psychologist: Raised Counselling psychologist: Environmental education officer Method: Radiographer, therapeutic: Transfer tub bench,Grab bars   Therapy/Group: Individual Therapy  [place] 08/31/2020, 9:46 AM

## 2020-08-31 NOTE — Progress Notes (Signed)
Physical Therapy Session Note  Patient Details  Name: Sydney [name] MRN: [id number] Date of Birth: 11-Feb-1933  Today's Date: 08/31/2020 PT Individual Time: 1100-1200  + 1415 -1515 PT Individual Time Calculation (min): 60 min  + 60 min  Short Term Goals: Week 1:  PT Short Term Goal 1 (Week 1): STG = LTG due to ELOS  Skilled Therapeutic Interventions/Progress Updates:      1st session: Pt greeted supine in bed at start of session with daughter at bedside; pt agreeable to PT session. No reports of pain. Discussed DC planning with daughter and coordinated family training session to be scheduled for tomorrow with granddaughter, Alyse Low. Supine<>sit with supervision with bed features. Donned slip-on shoes with maxA while seated EOB. Stand<>pivot transfer with CGA and no AD from EOB to w/c, antalgic on R hip. W/c transport for time management to main rehab gym. Focused majority of session on functional stair training. Pt able to recall appropriate sequencing with stepping up with L foot and descending with R foot leading. She completed 2x4 steps + 12 steps with minA and 1 hand rail on L. Pt placing her R forearm on therapist forearm during descent to assist with stabilization and balance - will review with family tomorrow for training. Reviewed with patient her XR images of R distal radius fx - education provided in hopes to improve carryover for NWB on RUE during functional mobility tasks. Stand<>pivot transfer to [place] with CGA and completed 8 minutes at workload 5, using BLE's only, and maintaining ~50 steps/minute cadence - working on R hip posterior chain strengthening and endurance training. She reports 17/20 on BORG perceived exertion scale after completion. Stand<>pivot with CGA back to her w/c and wheeled back to room for energy conservation. Completed stand<>pivot with CGA back to bed with use of bed rail and completed bed mobility with supervision. Bed alarm on and needs in reach with daughter at  bedside at end of session.  2nd session: Pt greeted supine in bed, daughter at bedside, pt agreeable to PT session. No reports of pain but does endorse fatigue and requests light activity session. Supine<>sit with supervision with HOB slightly elevated. Completes stand<>pivot transfer with CGA and no AD from EOB to w/c. W/c transport for time management to main rehab gym where she completed additional stand<>pivot with CGA and no AD to mat table. Gait training ~266ft with CGA and PFRW - pt with improved RLE weight shift and step-through gait pattern, less antalgic compared to previous sessions. In ADL apartment, performed furniture transfers from low sitting sofa couch with minA (assist needed for powering to rise) to [place]. Practiced safety approach to regular height bed with PFRW and CGA. Bed mobility on regular flat bed with supervision and increased effort for supine to sitting. In ADL kitchen, practiced opening/closing the fridge with CGA and PFRW as well as reaching for high objects in overhead cabinets with CGA and PFRW. She performed corn hole toss with CGA for balance while unsupported and then afterwards with PFRW and a reacher, practiced picking up objects from floor. At this point, pt reporting fatigue and requesting to return to room - ambulated back to her room from main rehab gym, ~36ft, with CGA and PFRW. She ended session supine in bed with needs in reach and bed alarm on. Pt made comfortable and was very pleased with progress made.  Therapy Documentation Precautions:  Precautions Precautions: Fall,Other (comment) Precaution Comments: NWB in RUE, can WB through elbow as tolerated. Required Braces or Orthoses:  Splint/Cast Splint/Cast: R cockup wrist splint. Restrictions Weight Bearing Restrictions: Yes RUE Weight Bearing: Weight bearing as tolerated Other Position/Activity Restrictions: ok to WB through elbow on platform General:    Therapy/Group: Individual Therapy  Alger Simons 08/31/2020, 7:44 AM

## 2020-08-31 NOTE — Progress Notes (Signed)
[facility] PHYSICAL MEDICINE & REHABILITATION PROGRESS NOTE  Subjective/Complaints: Patient seen laying in bed this morning.  He states he slept well overnight.  She denies he had 1 elevated blood pressure this morning and has questions.  Daughter at bedside.  ROS: Denies CP, SOB, N/V/D  Objective: Vital Signs: Blood pressure (!) 141/82, pulse 73, temperature 98.5 F (36.9 C), resp. rate 16, weight 74.5 kg, SpO2 97 %. No results found. No results for input(s): WBC, HGB, HCT, PLT in the last 72 hours. No results for input(s): NA, K, CL, CO2, GLUCOSE, BUN, CREATININE, CALCIUM in the last 72 hours.  Intake/Output Summary (Last 24 hours) at 08/31/2020 1116 Last data filed at 08/31/2020 0844 Gross per 24 hour  Intake 720 ml  Output --  Net 720 ml        Physical Exam: BP (!) 141/82 (BP Location: Left Arm)   Pulse 73   Temp 98.5 F (36.9 C)   Resp 16   Wt 74.5 kg   SpO2 97%   BMI 27.76 kg/m  Constitutional: No distress . Vital signs reviewed. HENT: Facial trauma.  Right periorbital edema.  Eyes: EOMI. No discharge. Cardiovascular: No JVD.  RRR. Respiratory: Normal effort.  No stridor.  Bilateral clear to auscultation. GI: Non-distended.  BS +. Skin: Warm and dry.  Intact. Psych: Normal mood.  Normal behavior. Musc: Right wrist with edema and tenderness, stable Neuro: Alert Makes eye contact with examiner.   Motor: LUE: 5/5 proximal distal Bilateral lower extremities: 4-4+/5 proximal distal, improving Right upper extremity: Shoulder abduction 4+/5, distally limited due to splint, stable  Assessment/Plan: 1. Functional deficits which require 3+ hours per day of interdisciplinary therapy in a comprehensive inpatient rehab setting.  Physiatrist is providing close team supervision and 24 hour management of active medical problems listed below.  Physiatrist and rehab team continue to assess barriers to discharge/monitor patient progress toward functional and medical  goals   Care Tool:  Bathing    Body parts bathed by patient: Chest,Abdomen,Front perineal area,Buttocks,Right upper leg,Left upper leg,Face,Right arm,Left arm (sponge bath today)   Body parts bathed by helper: Right lower leg,Left lower leg,Left arm     Bathing assist Assist Level: Supervision/Verbal cueing     Upper Body Dressing/Undressing Upper body dressing   What is the patient wearing?: Bra,Pull over shirt    Upper body assist Assist Level: Supervision/Verbal cueing    Lower Body Dressing/Undressing Lower body dressing      What is the patient wearing?: Underwear/pull up,Pants     Lower body assist Assist for lower body dressing: Contact Guard/Touching assist     Toileting Toileting    Toileting assist Assist for toileting: Contact Guard/Touching assist     Transfers Chair/bed transfer  Transfers assist     Chair/bed transfer assist level: Contact Guard/Touching assist     Locomotion Ambulation   Ambulation assist      Assist level: Minimal Assistance - Patient > 75% Assistive device: Walker-platform Max distance: 50   Walk 10 feet activity   Assist     Assist level: Minimal Assistance - Patient > 75% Assistive device: Walker-platform   Walk 50 feet activity   Assist    Assist level: Minimal Assistance - Patient > 75% Assistive device: Walker-platform    Walk 150 feet activity   Assist Walk 150 feet activity did not occur: Safety/medical concerns         Walk 10 feet on uneven surface  activity   Assist Walk 10 feet  on uneven surfaces activity did not occur: Safety/medical concerns         Wheelchair     Assist Will patient use wheelchair at discharge?: No   Wheelchair activity did not occur: N/A         Wheelchair 50 feet with 2 turns activity    Assist    Wheelchair 50 feet with 2 turns activity did not occur: N/A       Wheelchair 150 feet activity     Assist  Wheelchair 150 feet  activity did not occur: N/A        Medical Problem List and Plan: 1.  Traumatic SAH/ICH, right orbital fracture as well as right wrist fracture secondary to mechanical fall 08/18/2020.  Status post reconstruction of orbital floor 08/24/2020  Continue CIR 2.  Antithrombotics: -DVT/anticoagulation: SCDs             -antiplatelet therapy: N/A 3. Post-surgical pain: Continue oxycodone as needed as well as Flexeril  Controlled with meds on 5/20  Monitor with increased exertion 4. Mood: Provide emotional support             -antipsychotic agents: N/A 5. Neuropsych: This patient is capable of making decisions on her own behalf. 6. Skin/Wound Care: Routine skin checks 7. Fluids/Electrolytes/Nutrition: Routine in and outs 8.  Right nondisplaced fracture of distal radius.    Nonweightbearing at the wrist.  Cock up splint.  Patient is using a platform walker.  No surgical intervention 9.  Hypertension.  Patient on Corgard 40 mg daily prior to admission stopped due to bradycardia.    Corgard 20 started on 5/20  Monitor with increased mobility 10.  GERD.  Protonix 11.  Slow transit constipation.  Senokot daily, Dulcolax tablet daily as needed  Improving 12. Right knee severe OA: continue voltaren gel.  13.  Hypoalbuminemia  Supplement initiated on 5/17   LOS: 4 days A FACE TO FACE EVALUATION WAS PERFORMED  Sydney [name] Lorie Phenix 08/31/2020, 11:16 AM

## 2020-09-01 NOTE — Progress Notes (Addendum)
Physical Therapy Session Note  Patient Details  Name: Sydney [name] MRN: [id number] Date of Birth: Mar 24, 1933  Today's Date: 09/01/2020 PT Individual Time: 1300-1400 PT Individual Time Calculation (min): 60 min   Short Term Goals: Week 1:  PT Short Term Goal 1 (Week 1): STG = LTG due to ELOS      Skilled Therapeutic Interventions/Progress Updates:  Pt resting in recliner; she denied pain.  Dtr present. Sit> stand to [place] with close supervision.  Gait into BR to toilet with close supervision.  Toilet transfer and clothing mgt with close supervision.  Continent of bladder.  Hand washing at sink with supervision.   Gait training with PFRW on level tile x 120' including turns, CGA.   Pt related previous fall with injury.  PT discussed maintaining muscle mass and improving ROM bil LEs to improve balance.  Balance challenge and sustained stretch bil heel cords, standing with forefeet on blue wedge, with bil UE support on PFRW> 0UE, with resulting backwards sway.  Also in standing on blue wedge, bil UE task, folding laundry on table in front of her, x 8 pieces, without LOB  Standing on airex mat with PFRW, 10 x 1 mini squats, heel/toe raises with rare min assist for drifting backwards. Attempted standing in parallel bars with LUE only support, for R hip abduction; pt unable. Seated 10 x 1  bil hip abduction.  Pt has limited L hip abductor activation.  At end of session, pt transferred wc> bed with close supervision, using PFRW.  Sit> supine with supervision.  Pt's R knee sore from session, due to OA.  PT placed ice pack on R knee and informed family/pt to remove it after 20 min.  Bed alarm set and needs left at hand.     Therapy Documentation Precautions:  Precautions Precautions: Fall,Other (comment) Precaution Comments: NWB in RUE, can WB through elbow as tolerated. Required Braces or Orthoses: Splint/Cast Splint/Cast: R cockup wrist splint. Restrictions Weight Bearing Restrictions: Yes RUE  Weight Bearing: Weight bearing as tolerated Other Position/Activity Restrictions: ok to WB through elbow on platform       Therapy/Group: Individual Therapy  Yanis Larin 09/01/2020, 4:49 PM

## 2020-09-01 NOTE — Progress Notes (Signed)
Occupational Therapy Session Note  Patient Details  Name: Sydney [name] MRN: [id number] Date of Birth: 28-Dec-1932  Today's Date: 09/01/2020 OT Individual Time: 1100-1200 OT Individual Time Calculation (min): 60 min    Short Term Goals: Week 1:  OT Short Term Goal 1 (Week 1): pt will be able to self cleanse post toileting with S. OT Short Term Goal 2 (Week 1): Pt will be able to pull pants over hips with S. OT Short Term Goal 3 (Week 1): Pt will be able to don bra and shirt S.  Skilled Therapeutic Interventions/Progress Updates:    Pt greeted seated in wc with family present for family education. Granddaughter in-law accompanied pt and therapist down to therapy apartment. Set-up walk-in shower to simulate home.Practiced stepping backwards over shower ledge with family providing close supervision/CGA. Practiced ambulating with platform RW into bathroom and also transitioning on and off BSC placed over toilet. Worked on standing balance/endurance with dynamic standing task on foam block. Increased balance challenge by having pt reach behind her to the side to collect cards and place on matching placemat. Pt returned to room at end of session and completed stand-pivot over to recliner with CGA. Pt left seated In recliner with chair alarm on, family present, and needs met.    Therapy Documentation Precautions:  Precautions Precautions: Fall,Other (comment) Precaution Comments: NWB in RUE, can WB through elbow as tolerated. Required Braces or Orthoses: Splint/Cast Splint/Cast: R cockup wrist splint. Restrictions Weight Bearing Restrictions: Yes RUE Weight Bearing: Weight bearing as tolerated Other Position/Activity Restrictions: ok to WB through elbow on platform Pain:   2/10 overall in R LE. Repositioned for comfort.   Therapy/Group: Individual Therapy  Valma Cava 09/01/2020, 11:46 AM

## 2020-09-01 NOTE — Progress Notes (Signed)
Physical Therapy Session Note  Patient Details  Name: Sydney [name] MRN: [id number] Date of Birth: November 24, 1932  Today's Date: 09/01/2020 PT Individual Time: 1000-1045 PT Individual Time Calculation (min): 45 min   Short Term Goals: Week 1:  PT Short Term Goal 1 (Week 1): STG = LTG due to ELOS  Skilled Therapeutic Interventions/Progress Updates:    Pt greeted supine in bed at start of session. Family (son and daughter in law) at bedside. Focus of session to complete family education/training to prepare for upcoming DC next week. Lengthy discussion with all members regarding pt's current mobility status, PT goals, home safety training, DME rec's, role of f/u therapies, fall prevention strategies at home, wearing her [place], etc. All questions and concerns addressed. Family also reports that someone will always be at the house with her when she is up and walking or transferring. Bed mobility completed with supervision with use of bedrail - cues for NWB compliance on RUE. Stand<>pivot transfer with CGA and no AD to w/c and wheeled to main therapy gym for time management. Completed stair negotiation up/down x4 steps with 1 hand rail on L - stepping up with L foot leading and lateral stepping down with L foot leading. Pt required minA for stairs. Family present for observation - family reports this is near her baseline as she always required assistance for stairs. They reported that they didn't feel hands-on training was necessary despite encouragement. Gait training 133ft + 253ft with CGA and PFRW - family present for observation and educated on guarding technique and cues to provide to patient such as: proximity to RW and paced ambulation. Completed car transfer with car height set to simulate their mid-size SUV. Pt able to complete with CGA and PFRW with appropriate safety awareness and ability to manage LE's in/out of car (although somewhat difficult with RLE). She was returned to room in w/c for energy  conservation and completed stand<>pivot transfer with CGA back to bed. Bed mobiltiy completed with supervision. Pt requesting a Tylenol prior to upcoming OT session - RN made aware at end of session. She missed 15 minutes of therapy due to fatigue and all education completed with no further questions from family.   Therapy Documentation Precautions:  Precautions Precautions: Fall,Other (comment) Precaution Comments: NWB in RUE, can WB through elbow as tolerated. Required Braces or Orthoses: Splint/Cast Splint/Cast: R cockup wrist splint. Restrictions Weight Bearing Restrictions: Yes RUE Weight Bearing: Weight bearing as tolerated Other Position/Activity Restrictions: ok to WB through elbow on platform General: PT Amount of Missed Time (min): 15 Minutes PT Missed Treatment Reason: Patient fatigue  Therapy/Group: Individual Therapy  Alger Simons 09/01/2020, 7:37 AM

## 2020-09-02 DIAGNOSIS — S066X0D Traumatic subarachnoid hemorrhage without loss of consciousness, subsequent encounter: Secondary | ICD-10-CM | POA: Diagnosis not present

## 2020-09-02 DIAGNOSIS — I10 Essential (primary) hypertension: Secondary | ICD-10-CM | POA: Diagnosis not present

## 2020-09-02 DIAGNOSIS — G8918 Other acute postprocedural pain: Secondary | ICD-10-CM | POA: Diagnosis not present

## 2020-09-02 DIAGNOSIS — K5901 Slow transit constipation: Secondary | ICD-10-CM | POA: Diagnosis not present

## 2020-09-02 NOTE — Progress Notes (Signed)
Physical Therapy Session Note  Patient Details  Name: Sydney [name] MRN: [id number] Date of Birth: March 14, 1933  Today's Date: 09/02/2020 PT Individual Time: 1030-1113 PT Individual Time Calculation (min): 43 min   Short Term Goals: Week 1:  PT Short Term Goal 1 (Week 1): STG = LTG due to ELOS  Skilled Therapeutic Interventions/Progress Updates:   Pt presents in bed with daughter at bedside. Reports family education went well yesterday (other family members) and denies any concerns in regards to upcoming discharge. Functional bed mobility independent with extra time due to NWB restrictions. Supervision for basic transfers with PFRW throughout session from various surfaces.  Functional dynamic gait with PFRW through obstacle course while focusing on stepping over objects to simulate community or home environment mobility with overall CGA and cues for technique for improved safety and control.   NMR for balance retraining on compliant surface while performing UE functional movements for reaching task without UE support with CGA overall for balance within limited range. Standing heel/toe raises while on compliant surface to increase challenge and focus on balance retraining x 10 reps each with UE support.  Supine lumbar and core exercises to increase improved overall mobility and pain relief/muscle tightness. Supine trunk rotation and then with component of focused core activation with slower movement. Pt reports this movement "feels good" on her pelvic/groin region on R from the fall. Encouraged to do this daily as needed. Also educated on pelvic tilt and activation of transverse abdominal musculature and progressed to modified bridge x 10 reps each. Cues for breathing during activity and exhale on exertion. Pt very pleased with these exercises and excited to try out on her own.   Pt reports swelling in R hand improved today. Educated on use of positioning and elevation as well as mobility if  swelling resumes. Verbalized understanding.   End of session transfers with CGA and no AD to recliner and daughter at bedside.  Therapy Documentation Precautions:  Precautions Precautions: Fall,Other (comment) Precaution Comments: NWB in RUE, can WB through elbow as tolerated. Required Braces or Orthoses: Splint/Cast Splint/Cast: R cockup wrist splint. Restrictions Weight Bearing Restrictions: Yes RUE Weight Bearing: Weight bearing as tolerated Other Position/Activity Restrictions: ok to WB through elbow on platform    Pain: Mild pain (unrated) on R side generally in hip and RUE - premedicated per report.    Therapy/Group: Individual Therapy  Canary Brim Ivory Broad, PT, DPT, CBIS  09/02/2020, 11:15 AM

## 2020-09-02 NOTE — Progress Notes (Signed)
We will [place] PHYSICAL MEDICINE & REHABILITATION PROGRESS NOTE  Subjective/Complaints: Patient seen laying in bed this morning.  She states she slept well overnight.  She is looking forward to discharge soon.  She has questions regarding sutures, but later realizes she does not have any in place.  ROS: Denies CP, SOB, N/V/D  Objective: Vital Signs: Blood pressure 123/68, pulse 65, temperature 98.6 F (37 C), temperature source Oral, resp. rate 16, weight 74.5 kg, SpO2 97 %. No results found. No results for input(s): WBC, HGB, HCT, PLT in the last 72 hours. No results for input(s): NA, K, CL, CO2, GLUCOSE, BUN, CREATININE, CALCIUM in the last 72 hours.  Intake/Output Summary (Last 24 hours) at 09/02/2020 1345 Last data filed at 09/02/2020 0175 Gross per 24 hour  Intake 480 ml  Output --  Net 480 ml        Physical Exam: BP 123/68 (BP Location: Left Arm)   Pulse 65   Temp 98.6 F (37 C) (Oral)   Resp 16   Wt 74.5 kg   SpO2 97%   BMI 27.76 kg/m   Constitutional: No distress . Vital signs reviewed. HENT: Facial trauma.  Right periorbital edema. Eyes: EOMI. No discharge. Cardiovascular: No JVD.  RRR. Respiratory: Normal effort.  No stridor.  Bilateral clear to auscultation. GI: Non-distended.  BS +. Skin: Warm and dry.  Intact. Psych: Normal mood.  Normal behavior. Musc: Right wrist with edema and tenderness, unchanged Neuro: Alert Makes eye contact with examiner.   Motor: LUE: 5/5 proximal distal Bilateral lower extremities: 4-4+/5 proximal distal, improving Right upper extremity: Shoulder abduction 4+/5, distally limited due to splint, unchanged  Assessment/Plan: 1. Functional deficits which require 3+ hours per day of interdisciplinary therapy in a comprehensive inpatient rehab setting.  Physiatrist is providing close team supervision and 24 hour management of active medical problems listed below.  Physiatrist and rehab team continue to assess barriers to  discharge/monitor patient progress toward functional and medical goals   Care Tool:  Bathing    Body parts bathed by patient: Chest,Abdomen,Front perineal area,Buttocks,Right upper leg,Left upper leg,Face,Right arm,Left arm (sponge bath today)   Body parts bathed by helper: Right lower leg,Left lower leg,Left arm     Bathing assist Assist Level: Supervision/Verbal cueing     Upper Body Dressing/Undressing Upper body dressing   What is the patient wearing?: Bra,Pull over shirt    Upper body assist Assist Level: Supervision/Verbal cueing    Lower Body Dressing/Undressing Lower body dressing      What is the patient wearing?: Underwear/pull up,Pants     Lower body assist Assist for lower body dressing: Contact Guard/Touching assist     Toileting Toileting    Toileting assist Assist for toileting: Contact Guard/Touching assist     Transfers Chair/bed transfer  Transfers assist     Chair/bed transfer assist level: Supervision/Verbal cueing     Locomotion Ambulation   Ambulation assist      Assist level: Contact Guard/Touching assist Assistive device: Walker-platform Max distance: 50'   Walk 10 feet activity   Assist     Assist level: Contact Guard/Touching assist Assistive device: Walker-platform   Walk 50 feet activity   Assist    Assist level: Contact Guard/Touching assist Assistive device: Walker-platform    Walk 150 feet activity   Assist Walk 150 feet activity did not occur: Safety/medical concerns  Assist level: Minimal Assistance - Patient > 75% Assistive device: Walker-platform    Walk 10 feet on uneven surface  activity   Assist Walk 10 feet on uneven surfaces activity did not occur: Safety/medical concerns         Wheelchair     Assist Will patient use wheelchair at discharge?: No   Wheelchair activity did not occur: N/A         Wheelchair 50 feet with 2 turns activity    Assist    Wheelchair 50  feet with 2 turns activity did not occur: N/A       Wheelchair 150 feet activity     Assist  Wheelchair 150 feet activity did not occur: N/A        Medical Problem List and Plan: 1.  Traumatic SAH/ICH, right orbital fracture as well as right wrist fracture secondary to mechanical fall 08/18/2020.  Status post reconstruction of orbital floor 08/24/2020  Continue CIR 2.  Antithrombotics: -DVT/anticoagulation: SCDs             -antiplatelet therapy: N/A 3. Post-surgical pain: Continue oxycodone as needed as well as Flexeril  Controlled with meds on 5/22  Monitor with increased exertion 4. Mood: Provide emotional support             -antipsychotic agents: N/A 5. Neuropsych: This patient is capable of making decisions on her own behalf. 6. Skin/Wound Care: Routine skin checks 7. Fluids/Electrolytes/Nutrition: Routine in and outs 8.  Right nondisplaced fracture of distal radius.    Nonweightbearing at the wrist.  Cock up splint.  Patient is using a platform walker.  No surgical intervention 9.  Hypertension.  Patient on Corgard 40 mg daily prior to admission stopped due to bradycardia.    Corgard 20 started on 5/20  Controlled on 5/22  Monitor with increased mobility 10.  GERD.  Protonix 11.  Slow transit constipation.  Senokot daily, Dulcolax tablet daily as needed  Improved 12. Right knee severe OA: continue voltaren gel.  13.  Hypoalbuminemia  Supplement initiated on 5/17   LOS: 6 days A FACE TO FACE EVALUATION WAS PERFORMED  Sydney [name] Sydney [name] 09/02/2020, 1:45 PM

## 2020-09-03 ENCOUNTER — Telehealth: Payer: Self-pay

## 2020-09-03 DIAGNOSIS — S066X0D Traumatic subarachnoid hemorrhage without loss of consciousness, subsequent encounter: Secondary | ICD-10-CM | POA: Diagnosis not present

## 2020-09-03 DIAGNOSIS — I10 Essential (primary) hypertension: Secondary | ICD-10-CM | POA: Diagnosis not present

## 2020-09-03 DIAGNOSIS — S066X0S Traumatic subarachnoid hemorrhage without loss of consciousness, sequela: Secondary | ICD-10-CM | POA: Diagnosis not present

## 2020-09-03 MED ORDER — OXYCODONE HCL 5 MG PO TABS: 5.0000 mg | ORAL_TABLET | Freq: Four times a day (QID) | ORAL | Status: DC | PRN

## 2020-09-03 NOTE — Progress Notes (Signed)
We will [place] PHYSICAL MEDICINE & REHABILITATION PROGRESS NOTE  Subjective/Complaints: No complaints this morning Daughter is at bedside She asks about one small suture under right eye- whether it is dissolvable- discussed with Linna Hoff whether trauma can confirm that it is dissolvable prior to her d/c Wed  ROS: Denies CP, SOB, N/V/D  Objective: Vital Signs: Blood pressure 111/65, pulse 79, temperature 98.3 F (36.8 C), temperature source Oral, resp. rate 18, weight 74.5 kg, SpO2 97 %. No results found. No results for input(s): WBC, HGB, HCT, PLT in the last 72 hours. No results for input(s): NA, K, CL, CO2, GLUCOSE, BUN, CREATININE, CALCIUM in the last 72 hours.  Intake/Output Summary (Last 24 hours) at 09/03/2020 0846 Last data filed at 09/02/2020 1834 Gross per 24 hour  Intake 480 ml  Output --  Net 480 ml        Physical Exam: BP 111/65 (BP Location: Left Arm)   Pulse 79   Temp 98.3 F (36.8 C) (Oral)   Resp 18   Wt 74.5 kg   SpO2 97%   BMI 27.76 kg/m   Gen: no distress, normal appearing HEENT: Facial trauma.  Right periorbital edema. 1 small white suture in place- nontender Cardio: Reg rate Chest: normal effort, normal rate of breathing Abd: soft, non-distended Ext: no edema Psych: pleasant, normal affect Skin: intact Musc: Right wrist with edema and tenderness, unchanged Neuro: Alert Makes eye contact with examiner.   Motor: LUE: 5/5 proximal distal Bilateral lower extremities: 4-4+/5 proximal distal, improving Right upper extremity: Shoulder abduction 4+/5, distally limited due to splint, unchanged   Assessment/Plan: 1. Functional deficits which require 3+ hours per day of interdisciplinary therapy in a comprehensive inpatient rehab setting.  Physiatrist is providing close team supervision and 24 hour management of active medical problems listed below.  Physiatrist and rehab team continue to assess barriers to discharge/monitor patient progress toward  functional and medical goals   Care Tool:  Bathing    Body parts bathed by patient: Chest,Abdomen,Front perineal area,Buttocks,Right upper leg,Left upper leg,Face,Right arm,Left arm (sponge bath today)   Body parts bathed by helper: Right lower leg,Left lower leg,Left arm     Bathing assist Assist Level: Supervision/Verbal cueing     Upper Body Dressing/Undressing Upper body dressing   What is the patient wearing?: Bra,Pull over shirt    Upper body assist Assist Level: Supervision/Verbal cueing    Lower Body Dressing/Undressing Lower body dressing      What is the patient wearing?: Underwear/pull up,Pants     Lower body assist Assist for lower body dressing: Contact Guard/Touching assist     Toileting Toileting    Toileting assist Assist for toileting: Contact Guard/Touching assist     Transfers Chair/bed transfer  Transfers assist     Chair/bed transfer assist level: Supervision/Verbal cueing     Locomotion Ambulation   Ambulation assist      Assist level: Contact Guard/Touching assist Assistive device: Walker-platform Max distance: 50'   Walk 10 feet activity   Assist     Assist level: Contact Guard/Touching assist Assistive device: Walker-platform   Walk 50 feet activity   Assist    Assist level: Contact Guard/Touching assist Assistive device: Walker-platform    Walk 150 feet activity   Assist Walk 150 feet activity did not occur: Safety/medical concerns  Assist level: Minimal Assistance - Patient > 75% Assistive device: Walker-platform    Walk 10 feet on uneven surface  activity   Assist Walk 10 feet on uneven surfaces  activity did not occur: Safety/medical concerns         Wheelchair     Assist Will patient use wheelchair at discharge?: No   Wheelchair activity did not occur: N/A         Wheelchair 50 feet with 2 turns activity    Assist    Wheelchair 50 feet with 2 turns activity did not occur:  N/A       Wheelchair 150 feet activity     Assist  Wheelchair 150 feet activity did not occur: N/A        Medical Problem List and Plan: 1.  Traumatic SAH/ICH, right orbital fracture as well as right wrist fracture secondary to mechanical fall 08/18/2020.  Status post reconstruction of orbital floor 08/24/2020  Continue CIR 2.  Antithrombotics: -DVT/anticoagulation: SCDs             -antiplatelet therapy: N/A 3. Post-surgical pain: Decrease oxycodone to q6H as needed as well as Flexeril- patient agreeable  Controlled with meds on 5/23  Monitor with increased exertion 4. Mood: Provide emotional support             -antipsychotic agents: N/A 5. Neuropsych: This patient is capable of making decisions on her own behalf. 6. Skin/Wound Care: Routine skin checks 7. Fluids/Electrolytes/Nutrition: Routine in and outs 8.  Right nondisplaced fracture of distal radius.    Nonweightbearing at the wrist.  Cock up splint.  Patient is using a platform walker.  No surgical intervention 9.  Hypertension.  Patient on Corgard 40 mg daily prior to admission stopped due to bradycardia.    Continue Corgard 20 started on 5/20  Controlled on 5/23, discussed with patient  Monitor with increased mobility 10.  GERD.  Protonix 11.  Slow transit constipation.  Senokot daily, Dulcolax tablet daily as needed  Improved 12. Right knee severe OA: continue voltaren gel.  13.  Hypoalbuminemia  Supplement initiated on 5/17   LOS: 7 days A FACE TO FACE EVALUATION WAS PERFORMED  Clide Deutscher Yosselin Zoeller 09/03/2020, 8:46 AM

## 2020-09-03 NOTE — Progress Notes (Signed)
Physical Therapy Session Note  Patient Details  Name: Sydney [name] MRN: [id number] Date of Birth: [birth year]  Today's Date: 09/03/2020 PT Individual Time: 1131-1200 + 1300-1415 PT Individual Time Calculation (min): 29 min  + 75 min  Short Term Goals: Week 1:  PT Short Term Goal 1 (Week 1): STG = LTG due to ELOS  Skilled Therapeutic Interventions/Progress Updates:     1st session: Pt greeted sleeping in bed at start of PT tx, awakens to voice and is pleasantly agreeable to PT session. Platform attachment delivered to room and adjusted height to her own RW to fit her appropriately. Supine<>sit mod I with bed features. Sit<>stand to [place] with supervision and ambulated ~253ft to dayroom gym with CGA and PFRW - antalgic gait with decreased RLE weight shift. Cues for safety awareness, paced activity, and keeping body within walker frame. In rehab gym, practiced safety approaches to sitting on different types of surfaces with PFRW and CGA (chair without armrests, high/low mat table, w/c). Ambulated back to her room with CGA and PFRW (similar cues as above) and completed bed mobility mod I with bed features. She remained supine in bed with bed alarm on and needs in reach at end of session.   2nd session: Pt greeted supine in bed at start of session, agreeable to PT tx. Reports mild R hip pain - rest breaks and mobility provided for pain management. Supine<>sit mod I with bed features. Pt requested assistance to the bathroom - sit<>stand with CGA to [place] and ambulated ~15ft within her room with CGA and PFRW to 3-1 BSC over toilet. Pt able to manage lower body dressing in standing with supervision. Pt continent of bladder, charted in flowsheets. She completed pericare without assistance. Sit<>stand to [place] with distant supervision and ambulated to her w/c, ~82ft, with CGA and PFRW. W/c transport for energy conservation to main rehab gym. Provided her with walker bag to improve mod I level to allow her to  carry objects with PFRW at home - pt appreciative. Stair training completed, navigated up/down 8 steps with minA and 1 hand rail with cues for step-to pattern and leading with L foot and descending with R foot leading. Pt placing her R forearm on pt's forearm to assist with stabilization during stairs - pt was educated on this over the weekend. Ambulated ~150ft from main rehab gym to ortho gym with [place] and CGA - cues provided as listed above in 1st session with emphasis on safety and PFRW management. Completed Nustep 10 minutes at workload 3, using BLE's only for R hip strengthening and endurance training - pt able to maintain ~70 steps/minute cadence and repots 13/20 RPE after completion. Rest break needed for recovery. Completed car transfer with car height set to simulate their large SUV - able to complete with CGA and PFRW with increased difficulty managing RLE in/out of car but capable with time provided. She practiced ambulating up/down 4ft ramp with CGA and PFRW with cues needed for keeping body within walker frame. Sit>Supine on mat table with supervision and completed the following supine there-ex: -2x10 bridges -2x10 SLR hip abduction -2x10 knee-to-chest *tactile cues for feedback and verbal cues for sequencing and technique for correct muscle activation  Required minA for supine<>sit on mat table for trunk elevation and transferred with CGA and PFRW back to her w/c. She was transported to room in w/c for energy conservation and completed stand<>pivot transfer with CGA and use of bed rail. Bed mobility completed with supervision and she remained  in bed with bed alarm on and needs in reach, daughter at bedside.  Therapy Documentation Precautions:  Precautions Precautions: Fall,Other (comment) Precaution Comments: NWB in RUE, can WB through elbow as tolerated. Required Braces or Orthoses: Splint/Cast Splint/Cast: R cockup wrist splint. Restrictions Weight Bearing Restrictions: Yes RUE  Weight Bearing: Weight bearing as tolerated Other Position/Activity Restrictions: ok to WB through elbow on platform General:    Therapy/Group: Individual Therapy  Alger Simons 09/03/2020, 7:39 AM

## 2020-09-03 NOTE — Discharge Summary (Addendum)
Physician Discharge Summary  Patient ID: Sydney [name] MRN: [id number] DOB/AGE: 85-Jul-1934 85 y.o.  Admit date: 08/27/2020 Discharge date: 09/05/2020  Discharge Diagnoses:  Principal Problem:   Traumatic subarachnoid hemorrhage [facility]) Active Problems:   Hypoalbuminemia due to protein-calorie malnutrition (HCC)   Slow transit constipation   Essential hypertension   Closed fracture of lower end of right radius with routine healing   Postoperative pain   Benign essential HTN GERD Iliac artery aneurysm PMR  Discharged Condition: Stable  Significant Diagnostic Studies: DG Wrist Complete Right  Result Date: 08/18/2020 CLINICAL DATA:  [address] at home. Patient fell over her walker. Right wrist pain. EXAM: RIGHT WRIST - COMPLETE 3+ VIEW COMPARISON:  None. FINDINGS: There is a subtle nondisplaced fracture of the distal radius, extending transversely across the metaphysis with evidence of a fracture line also intersecting the articular surface of the lunate facet. No other fractures. Widened scapholunate interval consistent with a disrupted scapholunate ligament, most likely chronic. Osteoarthritic changes at the scaphoid trapezium trapezoid articulation and the trapezium first metacarpal articulation with joint space narrowing, subchondral sclerosis and marginal osteophytes. Chondrocalcinosis noted along the triangular fibrocartilage complex. Mild dorsal soft tissue swelling. IMPRESSION: 1. Subtle nondisplaced fracture of the distal radial metaphysis with an intra-articular component. 2. No dislocation. 3. Disrupted scapholunate ligament, likely chronic. Electronically Signed   By: Lajean Manes M.D.   On: 08/18/2020 08:10   DG Knee 1-2 Views Right  Result Date: 08/22/2020 CLINICAL DATA:  Pain and swelling following recent injury EXAM: RIGHT KNEE - 1-2 VIEW COMPARISON:  None. FINDINGS: Frontal and lateral views were obtained. There is lateral patellar subluxation. No fracture or frank dislocation. Small  joint effusion. There is severe joint space narrowing medially and laterally with moderate narrowing of the patellofemoral joint. There is spurring in all compartments. No erosive change. There are foci of chondrocalcinosis. IMPRESSION: Lateral patellar subluxation. No fracture or dislocation. Small joint effusion. Extensive osteoarthritic change with narrowing most severe medially and laterally. Spurring noted in all compartments. There is chondrocalcinosis which may be seen with osteoarthritis or with calcium pyrophosphate deposition disease. Electronically Signed   By: Lowella Grip III M.D.   On: 08/22/2020 14:55   CT Head Wo Contrast  Result Date: 08/18/2020 CLINICAL DATA:  Patient fell striking the side of her face on a dresser. EXAM: CT HEAD WITHOUT CONTRAST CT ORBITS WITHOUT CONTRAST CT CERVICAL SPINE WITHOUT CONTRAST TECHNIQUE: Multidetector CT imaging of the head, cervical spine, and maxillofacial structures were performed using the standard protocol without intravenous contrast. Multiplanar CT image reconstructions of the cervical spine and maxillofacial structures were also generated. COMPARISON:  None. FINDINGS: CT HEAD FINDINGS Brain: Small amount of subarachnoid hemorrhage lies along the anterior left frontal lobe. No other intracranial hemorrhage. Ventricles are normal in size, for the patient's age, and normal in configuration. There are no parenchymal masses or mass effect. There is no evidence of ischemic infarction. Mild hypoattenuation lines the periventricular white matter consistent with chronic microvascular ischemic change. No extra-axial masses. Vascular: No hyperdense vessel or unexpected calcification. Skull: No skull fracture. Other: None. CT ORBIT FINDINGS Osseous: Comminuted, displaced and inferiorly depressed fractures of the right orbital floor, depressed into the maxillary sinus by 1.1 cm. Fractures extend from just posterior to the inferior orbital rim posteriorly to near  the orbital apex. There is a nondisplaced fracture of the anterior, inferior medial orbital wall on the right. No other fractures.  No bone lesions. Orbits: Right-sided exophthalmos. There is hemorrhage in the right postseptal  orbit, intraconal as well as extraconal spaces. The right globe is intact. The extraocular muscles and optic nerve appear intact. Normal left globe and orbit. Sinuses: Right maxillary sinus is opacified with hemorrhage. Mild mucosal thickening lines multiple right ethmoid air cells. Remaining sinuses and the mastoid air cells are clear. Soft tissues: Right periorbital soft tissue swelling/hemorrhage. CT CERVICAL SPINE FINDINGS Alignment: Normal. Skull base and vertebrae: No acute fracture. No primary bone lesion or focal pathologic process. Soft tissues and spinal canal: No prevertebral fluid or swelling. No visible canal hematoma. Disc levels: Moderate to marked loss of disc space noted from C3-C4 through the upper thoracic spine associated with endplate spurring and varying degrees of disc bulging. There are facet degenerative changes, less pronounced, most evident on the right at C2-C3. No convincing disc herniation. Upper chest: Enlarged heterogeneous thyroid gland. No acute findings. Other: None. IMPRESSION: HEAD CT 1. Small amount of subarachnoid hemorrhage over the anterior left frontal lobe. 2. No other acute intracranial abnormality. 3. No skull fracture. ORBITAL CT 1. Comminuted depressed right orbital floor fractures, with a fracture also involving the adjacent anterior inferior right medial orbital wall. 2. Right-sided exophthalmos due to postseptal intra and extraconal orbital hemorrhage. Right globe appears intact. CERVICAL CT 1. No fracture or acute finding. 2. Enlarged heterogeneous thyroid gland. Recommend nonemergent/urgent thyroid ultrasound (ref: J Am Coll Radiol. 2015 Feb;12(2): 143-50). Critical Value/emergent results were called by telephone at the time of interpretation  on 08/18/2020 at 8:54 am to provider [facility] , who verbally acknowledged these results. Electronically Signed   By: Lajean Manes M.D.   On: 08/18/2020 08:54   CT Cervical Spine Wo Contrast  Result Date: 08/18/2020 CLINICAL DATA:  Patient fell striking the side of her face on a dresser. EXAM: CT HEAD WITHOUT CONTRAST CT ORBITS WITHOUT CONTRAST CT CERVICAL SPINE WITHOUT CONTRAST TECHNIQUE: Multidetector CT imaging of the head, cervical spine, and maxillofacial structures were performed using the standard protocol without intravenous contrast. Multiplanar CT image reconstructions of the cervical spine and maxillofacial structures were also generated. COMPARISON:  None. FINDINGS: CT HEAD FINDINGS Brain: Small amount of subarachnoid hemorrhage lies along the anterior left frontal lobe. No other intracranial hemorrhage. Ventricles are normal in size, for the patient's age, and normal in configuration. There are no parenchymal masses or mass effect. There is no evidence of ischemic infarction. Mild hypoattenuation lines the periventricular white matter consistent with chronic microvascular ischemic change. No extra-axial masses. Vascular: No hyperdense vessel or unexpected calcification. Skull: No skull fracture. Other: None. CT ORBIT FINDINGS Osseous: Comminuted, displaced and inferiorly depressed fractures of the right orbital floor, depressed into the maxillary sinus by 1.1 cm. Fractures extend from just posterior to the inferior orbital rim posteriorly to near the orbital apex. There is a nondisplaced fracture of the anterior, inferior medial orbital wall on the right. No other fractures.  No bone lesions. Orbits: Right-sided exophthalmos. There is hemorrhage in the right postseptal orbit, intraconal as well as extraconal spaces. The right globe is intact. The extraocular muscles and optic nerve appear intact. Normal left globe and orbit. Sinuses: Right maxillary sinus is opacified with hemorrhage. Mild  mucosal thickening lines multiple right ethmoid air cells. Remaining sinuses and the mastoid air cells are clear. Soft tissues: Right periorbital soft tissue swelling/hemorrhage. CT CERVICAL SPINE FINDINGS Alignment: Normal. Skull base and vertebrae: No acute fracture. No primary bone lesion or focal pathologic process. Soft tissues and spinal canal: No prevertebral fluid or swelling. No visible canal hematoma. Disc levels:  Moderate to marked loss of disc space noted from C3-C4 through the upper thoracic spine associated with endplate spurring and varying degrees of disc bulging. There are facet degenerative changes, less pronounced, most evident on the right at C2-C3. No convincing disc herniation. Upper chest: Enlarged heterogeneous thyroid gland. No acute findings. Other: None. IMPRESSION: HEAD CT 1. Small amount of subarachnoid hemorrhage over the anterior left frontal lobe. 2. No other acute intracranial abnormality. 3. No skull fracture. ORBITAL CT 1. Comminuted depressed right orbital floor fractures, with a fracture also involving the adjacent anterior inferior right medial orbital wall. 2. Right-sided exophthalmos due to postseptal intra and extraconal orbital hemorrhage. Right globe appears intact. CERVICAL CT 1. No fracture or acute finding. 2. Enlarged heterogeneous thyroid gland. Recommend nonemergent/urgent thyroid ultrasound (ref: J Am Coll Radiol. 2015 Feb;12(2): 143-50). Critical Value/emergent results were called by telephone at the time of interpretation on 08/18/2020 at 8:54 am to provider [facility] , who verbally acknowledged these results. Electronically Signed   By: Lajean Manes M.D.   On: 08/18/2020 08:54   DG HIP UNILAT WITH PELVIS 2-3 VIEWS RIGHT  Result Date: 08/18/2020 CLINICAL DATA:  [address] at home. Patient fell over her walker. Complaining of right-sided pain. EXAM: DG HIP (WITH OR WITHOUT PELVIS) 2-3V RIGHT COMPARISON:  12/27/2015. FINDINGS: No acute fracture.  Old fracture of the  left inferior pubic ramus. Bilateral total hip arthroplasties appear well seated and aligned. SI joints and symphysis pubis are normally spaced and aligned. Skeletal structures are diffusely demineralized. Soft tissues are unremarkable. IMPRESSION: 1. No acute fracture or dislocation. No evidence of loosening of either hip arthroplasty. Electronically Signed   By: Lajean Manes M.D.   On: 08/18/2020 08:08   CT OrbitsS W/O CM  Result Date: 08/18/2020 CLINICAL DATA:  Patient fell striking the side of her face on a dresser. EXAM: CT HEAD WITHOUT CONTRAST CT ORBITS WITHOUT CONTRAST CT CERVICAL SPINE WITHOUT CONTRAST TECHNIQUE: Multidetector CT imaging of the head, cervical spine, and maxillofacial structures were performed using the standard protocol without intravenous contrast. Multiplanar CT image reconstructions of the cervical spine and maxillofacial structures were also generated. COMPARISON:  None. FINDINGS: CT HEAD FINDINGS Brain: Small amount of subarachnoid hemorrhage lies along the anterior left frontal lobe. No other intracranial hemorrhage. Ventricles are normal in size, for the patient's age, and normal in configuration. There are no parenchymal masses or mass effect. There is no evidence of ischemic infarction. Mild hypoattenuation lines the periventricular white matter consistent with chronic microvascular ischemic change. No extra-axial masses. Vascular: No hyperdense vessel or unexpected calcification. Skull: No skull fracture. Other: None. CT ORBIT FINDINGS Osseous: Comminuted, displaced and inferiorly depressed fractures of the right orbital floor, depressed into the maxillary sinus by 1.1 cm. Fractures extend from just posterior to the inferior orbital rim posteriorly to near the orbital apex. There is a nondisplaced fracture of the anterior, inferior medial orbital wall on the right. No other fractures.  No bone lesions. Orbits: Right-sided exophthalmos. There is hemorrhage in the right  postseptal orbit, intraconal as well as extraconal spaces. The right globe is intact. The extraocular muscles and optic nerve appear intact. Normal left globe and orbit. Sinuses: Right maxillary sinus is opacified with hemorrhage. Mild mucosal thickening lines multiple right ethmoid air cells. Remaining sinuses and the mastoid air cells are clear. Soft tissues: Right periorbital soft tissue swelling/hemorrhage. CT CERVICAL SPINE FINDINGS Alignment: Normal. Skull base and vertebrae: No acute fracture. No primary bone lesion or focal pathologic process. Soft  tissues and spinal canal: No prevertebral fluid or swelling. No visible canal hematoma. Disc levels: Moderate to marked loss of disc space noted from C3-C4 through the upper thoracic spine associated with endplate spurring and varying degrees of disc bulging. There are facet degenerative changes, less pronounced, most evident on the right at C2-C3. No convincing disc herniation. Upper chest: Enlarged heterogeneous thyroid gland. No acute findings. Other: None. IMPRESSION: HEAD CT 1. Small amount of subarachnoid hemorrhage over the anterior left frontal lobe. 2. No other acute intracranial abnormality. 3. No skull fracture. ORBITAL CT 1. Comminuted depressed right orbital floor fractures, with a fracture also involving the adjacent anterior inferior right medial orbital wall. 2. Right-sided exophthalmos due to postseptal intra and extraconal orbital hemorrhage. Right globe appears intact. CERVICAL CT 1. No fracture or acute finding. 2. Enlarged heterogeneous thyroid gland. Recommend nonemergent/urgent thyroid ultrasound (ref: J Am Coll Radiol. 2015 Feb;12(2): 143-50). Critical Value/emergent results were called by telephone at the time of interpretation on 08/18/2020 at 8:54 am to provider [facility] , who verbally acknowledged these results. Electronically Signed   By: Lajean Manes M.D.   On: 08/18/2020 08:54    Labs:  Basic Metabolic Panel: No results for  input(s): NA, K, CL, CO2, GLUCOSE, BUN, CREATININE, CALCIUM, MG, PHOS in the last 168 hours.  CBC: No results for input(s): WBC, NEUTROABS, HGB, HCT, MCV, PLT in the last 168 hours.  CBG: No results for input(s): GLUCAP in the last 168 hours.  Family history.  Mother with CAD.  Father with myocardial infarction hyperlipidemia.  Sister with diabetes.  Brother with hyperlipidemia.  Denies any colon cancer esophageal cancer or rectal cancer  Brief HPI:   Sydney [name] is a 87 y.o. right-handed female with history of hypertension hyperlipidemia, PMR, iliac artery aneurysm, bilateral hip replacement.  Per chart review lives alone.  Two-level home bed and bath main level 3 steps to entry.  Independent with a walker.  She has a son and granddaughter locally who can assist as needed.  She has a daughter also in [place] who will be assisting for a short time on discharge.  Presented 08/18/2020 after mechanical fall while walking to the bathroom when she slipped struck her head on a dresser.  Denied loss of consciousness.  Denied any chest pain or shortness of breath associated with the fall.  Cranial CT scan showed small amount of subarachnoid hemorrhage of the anterior left frontal lobe.  No other acute abnormality no skull fracture.  Patient sustained comminuted depressed right orbital floor fracture with fracture also involving the adjacent anterior inferior right medial orbital wall as well as right side exophthalmos.  CT cervical spine negative.  Patient sustained right wrist subtle nondisplaced fracture of the distal radial metaphysis with an intra-articular component.  No dislocation.  Admission chemistries unremarkable except glucose 135.  Neurosurgery follow-up for small traumatic SAH/ICH Dr. Christella Noa no surgical intervention.  Follow-up plastic surgery Dr. Claudia Desanctis in regards to right orbital floor fracture underwent reconstruction of orbital floor 08/24/2020 per Dr. Claudia Desanctis.  Advised conservative care of right  nondisplaced fracture of distal radius nonweightbearing with cock up splint no current surgical intervention.  Therapy evaluations completed due to patient decreased functional mobility was admitted for a comprehensive rehab program.   Hospital Course: Sydney [name] was admitted to rehab 08/27/2020 for inpatient therapies to consist of PT, ST and OT at least three hours five days a week. Past admission physiatrist, therapy team and rehab RN have worked together to  provide customized collaborative inpatient rehab.  Pertaining to patient's traumatic SAH/ICH as well as right orbital floor fracture and right wrist fracture secondary mechanical fall.  Patient had undergone reconstruction of orbital floor 08/24/2020 and would follow-up with Dr. Claudia Desanctis.  Conservative care of SAH/ICH.  Nonsurgical intervention of right nondisplaced fracture of distal radius nonweightbearing at the wrist cock up splint.  Neurovascular sensation intact.  Pain control with the use of Flexeril as well as oxycodone as needed.  Blood pressure controlled and monitored while on Corgard.  GERD with Protonix as advised.  Slow transit constipation resolved with laxative assistance.   Blood pressures were monitored on TID basis and soft and monitored     Rehab course: During patient's stay in rehab weekly team conferences were held to monitor patient's progress, set goals and discuss barriers to discharge. At admission, patient required minimal guard supine to sit minimal guard sit to supine moderate assist 35 feet platform rolling walker  Physical exam.  Blood pressure 128/74 pulse 88 temperature 98 respirations 18 oxygen saturation 97% room air Constitutional.  No acute distress HEENT.  Significant bruising right orbital area CN appear to be intact. Eyes.  Pupils round and reactive to light no discharge without nystagmus Neck.  Supple nontender no JVD without thyromegaly Cardiac regular rate rhythm without extra sounds or murmur  heard Abdomen.  Soft nontender positive bowel sounds without rebound Respiratory effort normal no respiratory distress without wheeze Skin.  Intact Musculoskeletal.  Right wrist with splint in place Neurologic.  Alert oriented x3 makes eye contact with examiner follows commands.  Moving right fingers in all other extremities well  He/She  has had improvement in activity tolerance, balance, postural control as well as ability to compensate for deficits. He/She has had improvement in functional use RUE/LUE  and RLE/LLE as well as improvement in awareness.  Functional dynamic gait with platform rolling walker through obstacle course while focusing on stepping over objects to simulate community or home environment with contact-guard assist.  Transfers are contact-guard assist no assistive device.  Set up walk-in shower to simulate home with contact-guard assist.  Gather his belongings for activities day living and homemaking.  Full family teaching completed plan discharged to home       Disposition: Discharged to home    Diet: Regular  Special Instructions: No driving smoking or alcohol  Nonweightbearing right upper extremity can weight-bear through elbow as tolerated  Medications at discharge. 1.  Tylenol as needed 2.  Flexeril 5 mg 3 times daily as needed muscle spasms 3.  Voltaren gel 2 g 4 times daily 4.  Corgard 20 mg p.o. daily 5.  Oxycodone 5 mg every 4 hours as needed moderate pain 6.  Protonix 40 mg p.o. daily 7.  Potassium and sodium phosphates 1 packet 3 times daily with meals and bedtime 8.  Senokot 2 tablets p.o. daily  30-35 minutes were spent completing discharge summary and discharge planning Discharge Instructions     Ambulatory referral to Physical Medicine Rehab   Complete by: As directed    Moderate complexity follow-up 1 to 2 weeks traumatic SAH/ICH        Follow-up Information     Jamse Arn, MD Follow up.   Specialty: Physical Medicine and  Rehabilitation Why: office to call for appointment Contact information: [address] [place] [address] [phone]         Cindra Presume, MD Follow up.   Specialty: Plastic Surgery Why: Call for  appointment Contact information: [address] [place] [address] [phone]         Ashok Pall, MD Follow up.   Specialty: Neurosurgery Why: Call for appointment Contact information: [address] [place] [address] [phone]                 Signed: Cathlyn Parsons 09/05/2020, 5:09 AM Patient was seen, face-face, and physical exam performed by me on day of discharge, greater than 30 minutes of total time spent.. Please see progress note from day of discharge as well.  Delice Lesch, MD, ABPMR

## 2020-09-03 NOTE — Telephone Encounter (Signed)
Call returned to [place], [place]  ( 4W / rm# [id number])regarding the following: Pt and her daughter were asking if the skin suture would need to be removed. Pt is to be d/c on Wed. 09/05/20. I informed Linna Hoff that the suture used on the skin will dissolve & no need to remove it. Linna Hoff reports that the pt is healing well.  Her vision has continued to improve & she will be ready for d/c on Wed. He did ask if Dr. Claudia Desanctis would need to see pt for f/u in the office, I informed him that I would consult with Dr. Claudia Desanctis & we will call the pt if he needs to see her back.

## 2020-09-03 NOTE — Progress Notes (Signed)
Physical Therapy Session Note  Patient Details  Name: Sydney [name] MRN: [id number] Date of Birth: [birth year]  Today's Date: 09/03/2020 PT Individual Time: 1000-1030 PT Individual Time Calculation (min): 30 min   Short Term Goals: Week 1:  PT Short Term Goal 1 (Week 1): STG = LTG due to ELOS   Skilled Therapeutic Interventions/Progress Updates:    pt received in recliner and agreeable to therapy. Pt directed in Stand pivot transfer with PFRW to WC, CGA and taken to gym in [facility] total A for time. Pt directed in gait training with PFRW 200' CGA-supervision with VC for increased trunk extension and increased stride length. Pt directed in 2x25 underhand bean bag toss to target ~6' away without external support at supervision. Pt requested to return to room in [facility] 2/2 fatigue, completed at total A for time. Pt directed in gait from room door to bed with PFRW at supervision 10', sit>supine mod I. Pt left in bed, All needs in reach and in good condition. Call light in hand.  And alarm set. Pt denied pain throughout.   Therapy Documentation Precautions:  Precautions Precautions: Fall,Other (comment) Precaution Comments: NWB in RUE, can WB through elbow as tolerated. Required Braces or Orthoses: Splint/Cast Splint/Cast: R cockup wrist splint. Restrictions Weight Bearing Restrictions: Yes RUE Weight Bearing: Weight bearing as tolerated Other Position/Activity Restrictions: ok to WB through elbow on platform General:   Vital Signs:   Pain:   Mobility:   Locomotion :    Trunk/Postural Assessment :    Balance:   Exercises:   Other Treatments:      Therapy/Group: Individual Therapy  Junie Panning 09/03/2020, 2:15 PM

## 2020-09-03 NOTE — Telephone Encounter (Signed)
Linna Hoff called to say that the patient had surgery with Dr. Claudia Desanctis and is scheduled to go home this Wednesday.  Linna Hoff said that the patient still has a suture in place and he cannot tell from the op report if it's dissolvable or not.  He said that they have been unable to remove that suture.  Linna Hoff would like to know if there is anything they need to do or if someone can take a look at it.  He said that the patient is in room 4W26.  Please call him at [phone].

## 2020-09-03 NOTE — Progress Notes (Signed)
Occupational Therapy Session Note  Patient Details  Name: Sydney [name] MRN: [id number] Date of Birth: Oct 07, 1932  Today's Date: 09/03/2020 OT Individual Time: 7001-7494 OT Individual Time Calculation (min): 26 min    Short Term Goals: Week 1:  OT Short Term Goal 1 (Week 1): pt will be able to self cleanse post toileting with S. OT Short Term Goal 2 (Week 1): Pt will be able to pull pants over hips with S. OT Short Term Goal 3 (Week 1): Pt will be able to don bra and shirt S.  Skilled Therapeutic Interventions/Progress Updates:    Pt completed supine to sit EOB with close supervision using the rails for support.  She then completed stand pivot transfer using the RW with platform on the right side with supervision to the wheelchair.  Therapist took her down to the ADL apartment where she completed simulated walk-in shower transfers with use of the RW as well as without.  Min guard to complete with the RW using posterior/anterior method as well as mod assist without secondary to increased right hip pain with weightbearing.  She also completed bed transfer onto and off of the bed in the apartment with supervision from the left side, which is where she would be sleeping at home.  She was able to complete functional mobility from the hallway outside of her room using the RW with min guard assist approximately 50' back to her bed to finish session.  Call button and phone in reach with pt's daughter in the room as well.      Therapy Documentation Precautions:  Precautions Precautions: Fall,Other (comment) Precaution Comments: NWB in RUE, can WB through elbow as tolerated. Required Braces or Orthoses: Splint/Cast Splint/Cast: R cockup wrist splint. Restrictions Weight Bearing Restrictions: Yes RUE Weight Bearing: Non weight bearing Other Position/Activity Restrictions: ok to WB through elbow on platform   Pain: Pain Assessment Pain Scale: Faces Pain Score: 1  Faces Pain Scale: Hurts a little  bit Pain Type: Acute pain Pain Location: Wrist Pain Orientation: Right Pain Descriptors / Indicators: Discomfort Pain Onset: On-going Pain Intervention(s): Repositioned Multiple Pain Sites: Yes 2nd Pain Site Pain Score: 8 Pain Type: Acute pain Pain Location: Groin Pain Orientation: Right Pain Descriptors / Indicators: Aching Pain Frequency: Intermittent Pain Onset: With Activity ADL: See Care Tool Section for some details of mobility and selfcare  Therapy/Group: Individual Therapy  Chloey Ricard OTR/L 09/03/2020, 4:24 PM

## 2020-09-03 NOTE — Consult Note (Signed)
Neuropsychological Consultation   Patient:   Sydney [name]   DOB:   [birth year]  MR Number:  [id number]  Location:  [facility] [id number] [place] [address] Dept: [place]: [phone]           Date of Service:   09/03/2020  Start Time:   9:15 AM End Time:   9:45 AM  Provider/Observer:  Ilean Skill, Psy.D.       Clinical Neuropsychologist       Billing Code/Service: [id number]  Chief Complaint:    MELEANE SELINGER is an 87 year old female with history of hypertension, hyperlipidemia, PMR, bilateral hip replacements.  Patient presented on 08/18/2020 after mechanical fall while walking to the bathroom when she slipped and struck her head on a dresser.  Patient denied any loss of consciousness.  Cranial CT scan showed small amount of subarachnoid hemorrhage of the anterior left frontal lobe.  No other intracranial processes were noted.  There is no skull fracture but the patient did sustain right wrist subtle nondisplaced fracture of the distal radial meta physis within intra articular component.  Patient also sustained communicated depressed right orbital floor fracture with fracture also involving the adjacent anterior inferior right medial orbital wall as well as right side exophthalmos.    Reason for Service:  Patient was referred for neuropsychological consultation due to coping and adjustment with recent facial injury and wrist injury and small left frontal subarachnoid hemorrhage.  Below is the HPI for the current admission.  HPI: Bettejane T. Aube is an 87 year old right-handed female with history of hypertension, hyperlipidemia, PMR, iliac artery aneurysm, bilateral hip replacements.  Per chart review lives alone.  Two-level home bed and bath main level 3 steps to entry.  Independent with a walker.  She has a son and granddaughter locally who can assist as needed.  She has a daughter also in  [place] who will be assisting for a short time on discharge.  Presented 08/18/2020 after mechanical fall while walking to the bathroom when she slipped and struck her head on a dresser.  Denied loss of consciousness.  Denied any chest pain or shortness of breath associated with the fall.  Cranial CT scan showed small amount of subarachnoid hemorrhage of the anterior left frontal lobe.  No other acute intracranial abnormality.  No skull fracture.  Patient sustained comminuted depressed right orbital floor fracture with fracture also involving the adjacent anterior inferior right medial orbital wall as well as right side exophthalmos.  CT cervical spine negative for fracture.  Patient sustained right wrist subtle nondisplaced fracture of the distal radial metaphysis with an intra-articular component.  No dislocation.  Admission chemistries unremarkable aside glucose 135.  Neurosurgery follow-up for small traumatic SAH/ICH Dr. Christella Noa no surgical intervention and monitored.  Follow-up plastic surgery Dr. Claudia Desanctis in regards to right orbital floor fracture and underwent reconstruction of orbital floor 08/24/2020 per Dr. Claudia Desanctis.  Advised conservative care of right nondisplaced fracture of distal radius nonweightbearing with cock up splint and no current surgical intervention.  Therapy evaluations completed due to patient decreased functional mobility was admitted for a comprehensive rehab program. Denies pain.   Current Status:  Patient was awake and alert as I entered the room sitting up in her bed in the upright position.  Patient with bright affect and engaging with good mental status and orientation.  Patient's daughter was also present in the room.  Patient reports that she  was looking forward to discharge on Wednesday with a mild complaint of some irritation from the stitch below her right eye.  Patient denies any significant or observable changes in cognition.  This was also confirmed by the patient's daughter.  Mood was  quite positive and denied any significant depression or anxiety although was initially stressed with hospital presentation but is coping better with plan discharge.  Has good support at home.  Behavioral Observation: RALENE GASPARYAN  presents as a 87 y.o.-year-old Right Caucasian Female who appeared her stated age. her dress was Appropriate and she was Well Groomed and her manners were Appropriate to the situation.  her participation was indicative of Appropriate and Attentive behaviors.  There were physical disabilities noted.  she displayed an appropriate level of cooperation and motivation.     Interactions:    Active Appropriate and Attentive  Attention:   within normal limits and attention span and concentration were age appropriate  Memory:   within normal limits; recent and remote memory intact  Visuo-spatial:  not examined  Speech (Volume):  normal  Speech:   normal; normal  Thought Process:  Coherent and Relevant  Though Content:  WNL; not suicidal and not homicidal  Orientation:   person, place, time/date and situation  Judgment:   Good  Planning:   Good  Affect:    Appropriate  Mood:    Euthymic  Insight:   Good  Intelligence:   normal  Medical History:   Past Medical History:  Diagnosis Date  . GERD (gastroesophageal reflux disease)   . Hx of lumbosacral spine surgery t  . Hyperlipidemia   . Hypertension   . Iliac artery aneurysm ([place])   . Lumbar spinal stenosis   . Polymyalgia rheumatica ([place]) 06/2016         Patient Active Problem List   Diagnosis Date Noted  . Benign essential HTN   . Hypoalbuminemia due to protein-calorie malnutrition ([place])   . Slow transit constipation   . Essential hypertension   . Closed fracture of lower end of right radius with routine healing   . Postoperative pain   . Traumatic subarachnoid hemorrhage ([place]) 08/27/2020  . Head injury 08/22/2020  . Fall 08/18/2020  . Right orbital fracture, closed, initial encounter ([place])  08/18/2020  . Tinnitus 08/08/2020  . Stage 3a chronic kidney disease ([place]) 03/30/2020  . Osteoporosis 09/02/2019  . Polymyalgia rheumatica ([place]) 06/12/2016  . Hyperlipemia 07/23/2015  . Generalized osteoarthritis of multiple sites 07/14/2014  . Advanced directives, counseling/discussion 12/02/2013  . Routine general medical examination at a health care facility 11/30/2012  . GERD (gastroesophageal reflux disease)   . Iliac artery aneurysm, right ([place]) 01/18/2011  . Obesity, unspecified 01/02/2009  . DEGENERATIVE DISC DISEASE, LUMBAR SPINE 01/02/2009  . Essential hypertension, benign 01/14/2007      Psychiatric History:  No prior psychiatric history  Family Med/Psych History:  Family History  Problem Relation Age of Onset  . Heart disease Mother   . Heart disease Father        Heart Disease before age 90  . Heart attack Father   . Hyperlipidemia Father   . Heart disease Sister   . Diabetes Sister   . Heart disease Brother   . Hyperlipidemia Brother   . Heart disease Brother   . Cancer Brother     Impression/DX:  TAHIRY SPICER is an 87 year old female with history of hypertension, hyperlipidemia, PMR, bilateral hip replacements.  Patient presented on 08/18/2020 after mechanical fall while  walking to the bathroom when she slipped and struck her head on a dresser.  Patient denied any loss of consciousness.  Cranial CT scan showed small amount of subarachnoid hemorrhage of the anterior left frontal lobe.  No other intracranial processes were noted.  There is no skull fracture but the patient did sustain right wrist subtle nondisplaced fracture of the distal radial meta physis within intra articular component.  Patient also sustained communicated depressed right orbital floor fracture with fracture also involving the adjacent anterior inferior right medial orbital wall as well as right side exophthalmos.   Patient was awake and alert as I entered the room sitting up in her bed in the  upright position.  Patient with bright affect and engaging with good mental status and orientation.  Patient's daughter was also present in the room.  Patient reports that she was looking forward to discharge on Wednesday with a mild complaint of some irritation from the stitch below her right eye.  Patient denies any significant or observable changes in cognition.  This was also confirmed by the patient's daughter.  Mood was quite positive and denied any significant depression or anxiety although was initially stressed with hospital presentation but is coping better with plan discharge.  Has good support at home.  Disposition/Plan:  Today we addressed coping and adjustment issues with planned discharge on Wednesday.  Diagnosis:    Subarachnoid hemorrhage following injury, no loss of consciousness, initial encounter [facility]) - Plan: Ambulatory referral to Physical Medicine Rehab         Electronically Signed   _______________________ Ilean Skill, Psy.D. Clinical Neuropsychologist

## 2020-09-03 NOTE — Progress Notes (Signed)
Occupational Therapy Session Note  Patient Details  Name: Sydney [name] MRN: [id number] Date of Birth: Jun 08, 1932  Today's Date: 09/03/2020 OT Individual Time: 1586-8257 OT Individual Time Calculation (min): 45 min    Short Term Goals: Week 1:  OT Short Term Goal 1 (Week 1): pt will be able to self cleanse post toileting with S. OT Short Term Goal 2 (Week 1): Pt will be able to pull pants over hips with S. OT Short Term Goal 3 (Week 1): Pt will be able to don bra and shirt S.  Skilled Therapeutic Interventions/Progress Updates:    Pt received in bed with dtr in her room. dtr reports she will be home with her mom for the first 3 days and then her granddtr will be helping her.   Pt completed all of her self care (bathing, toileting, dressing, grooming ) all with supervision.   Pt is moving more efficiently and able to rise to stand with only S.  Pt sat in recliner at end of session and engaged in UE AROM exercises. She continues to have limited R sh range due to pain from her fall.   Pt resting in recliner with all needs met.   Therapy Documentation Precautions:  Precautions Precautions: Fall,Other (comment) Precaution Comments: NWB in RUE, can WB through elbow as tolerated. Required Braces or Orthoses: Splint/Cast Splint/Cast: R cockup wrist splint. Restrictions Weight Bearing Restrictions: Yes RUE Weight Bearing: Weight bearing as tolerated Other Position/Activity Restrictions: ok to WB through elbow on platform   Pain: Pain Assessment Pain Score: 0-No pain ADL: ADL Eating: Independent Grooming: Independent Where Assessed-Grooming: Sitting at sink Upper Body Bathing: Supervision/safety Where Assessed-Upper Body Bathing: Shower Lower Body Bathing: Supervision/safety Where Assessed-Lower Body Bathing: Shower (long sponge) Upper Body Dressing: Supervision/safety Where Assessed-Upper Body Dressing: Edge of bed Lower Body Dressing: Supervision/safety Where Assessed-Lower  Body Dressing: Edge of bed Toileting: Supervision/safety Where Assessed-Toileting: Glass blower/designer: Close supervision Armed forces technical officer Method: Counselling psychologist: Raised toilet seat Social research officer, government: Environmental education officer Method: Radiographer, therapeutic: Transfer tub bench,Grab bars  Therapy/Group: Individual Therapy  [place] 09/03/2020, 9:13 AM

## 2020-09-04 DIAGNOSIS — G8918 Other acute postprocedural pain: Secondary | ICD-10-CM | POA: Diagnosis not present

## 2020-09-04 DIAGNOSIS — S066X0S Traumatic subarachnoid hemorrhage without loss of consciousness, sequela: Secondary | ICD-10-CM | POA: Diagnosis not present

## 2020-09-04 DIAGNOSIS — K5901 Slow transit constipation: Secondary | ICD-10-CM | POA: Diagnosis not present

## 2020-09-04 DIAGNOSIS — I10 Essential (primary) hypertension: Secondary | ICD-10-CM | POA: Diagnosis not present

## 2020-09-04 MED ORDER — OXYCODONE HCL 5 MG PO TABS: 5.0000 mg | ORAL_TABLET | Freq: Four times a day (QID) | ORAL | 0 refills | Status: DC | PRN

## 2020-09-04 MED ORDER — CYCLOBENZAPRINE HCL 5 MG PO TABS: 5.0000 mg | ORAL_TABLET | Freq: Three times a day (TID) | ORAL | 0 refills | Status: DC | PRN

## 2020-09-04 MED ORDER — POTASSIUM & SODIUM PHOSPHATES 280-160-250 MG PO PACK: 1.0000 | PACK | Freq: Three times a day (TID) | ORAL | 0 refills | Status: DC

## 2020-09-04 MED ORDER — CALCIUM CARBONATE 1250 (500 CA) MG PO TABS: 1.0000 | ORAL_TABLET | Freq: Two times a day (BID) | ORAL | 0 refills | Status: DC

## 2020-09-04 MED ORDER — DICLOFENAC SODIUM 1 % EX GEL: 2.0000 g | Freq: Four times a day (QID) | CUTANEOUS | 0 refills | Status: DC

## 2020-09-04 MED ORDER — PANTOPRAZOLE SODIUM 40 MG PO TBEC: 40.0000 mg | DELAYED_RELEASE_TABLET | Freq: Every day | ORAL | 3 refills | Status: DC

## 2020-09-04 MED ORDER — ACETAMINOPHEN 500 MG PO TABS: 500.0000 mg | ORAL_TABLET | Freq: Four times a day (QID) | ORAL | 0 refills | Status: DC | PRN

## 2020-09-04 MED ORDER — PHENOL 1.4 % MT LIQD: 1.0000 | OROMUCOSAL | 0 refills | Status: DC | PRN

## 2020-09-04 MED ORDER — NADOLOL 20 MG PO TABS: 20.0000 mg | ORAL_TABLET | Freq: Every day | ORAL | 0 refills | Status: DC

## 2020-09-04 NOTE — Progress Notes (Addendum)
Occupational Therapy Discharge Summary  Patient Details  Name: Sydney [name] MRN: [id number] Date of Birth: Aug 22, 1932  Today's Date: 09/04/2020 OT Individual Time: 1449-1535 OT Individual Time Calculation (min): 46 min   Session Note:  Pt in wheelchair to start session.   She was able to doff her old gripper socks and then donn new ones at modified independent level from the wheelchair.  She was also able to complete toilet transfer at supervision level with use of the RW with platform on the right side.  She then completed all aspects of toileting with modified independence sit to stand and ambulated out to the sink for washing her hands.  Took her down to the dayroom for session where she worked on standing balance with and without the RW for support while engaged in a Wii activity.  She was able to complete several sit to stand transitions at modified independence and maintain standing balance while using the LUE for activity at modified independent level.  Finished session with return to the room via wheelchair and transfer to the bed at modified independent level.  She was left with her daughter in the room and the call button in reach.    Patient has met 7 of 11 long term goals due to improved activity tolerance, improved balance and ability to compensate for deficits.  Patient to discharge at overall Supervision level.  Patient's care partner is independent to provide the necessary physical assistance at discharge.    Reasons goals not met: Pt needs supervision for simple meal prep, home management, toilet transfers, and bathing tasks.   Recommendation:  Patient will benefit from ongoing skilled OT services in home health setting to continue to advance functional skills in the area of BADL, iADL and Reduce care partner burden.  Pt will benefit from [place] eval for safety and continuation of progression with RUE weakness and ADL satus.    Equipment: No equipment provided  Reasons for  discharge: treatment goals met and discharge from hospital  Patient/family agrees with progress made and goals achieved: Yes  OT Discharge Precautions/Restrictions  Precautions Precautions: Fall;Other (comment) Precaution Comments: NWB in RUE, can WB through elbow as tolerated. Required Braces or Orthoses: Splint/Cast Splint/Cast: R cockup wrist splint. Restrictions Weight Bearing Restrictions: Yes RUE Weight Bearing: Non weight bearing Other Position/Activity Restrictions: ok to WB through elbow on platform  Pain Pain Assessment Pain Scale: Faces Pain Score: 7  Faces Pain Scale: Hurts a little bit Pain Type: Acute pain Pain Location: Wrist Pain Orientation: Right Pain Descriptors / Indicators: Discomfort Pain Frequency: Intermittent Pain Onset: With Activity Pain Intervention(s): Repositioned Multiple Pain Sites: No ADL ADL Eating: Independent Where Assessed-Eating: Wheelchair Grooming: Independent Where Assessed-Grooming: Wheelchair Upper Body Bathing: Modified independent Where Assessed-Upper Body Bathing: Shower,Chair Lower Body Bathing: Supervision/safety Where Assessed-Lower Body Bathing: Shower Upper Body Dressing: Independent Where Assessed-Upper Body Dressing: Wheelchair Lower Body Dressing: Modified independent Where Assessed-Lower Body Dressing: Edge of bed Toileting: Modified independent Where Assessed-Toileting: Bedside Commode Toilet Transfer: Close supervision Toilet Transfer Method: Counselling psychologist: Radiographer, therapeutic: Not assessed Social research officer, government: Close supervision Social research officer, government Method: Heritage manager: Transfer tub bench,Grab bars Vision Baseline Vision/History: No visual deficits Patient Visual Report: No change from baseline Vision Assessment?: No apparent visual deficits Additional Comments: Pt still with slight brusing around the left eye and slight discoloration  in the eye, but reports no difficulty with vision. Perception  Perception: Within Functional Limits Praxis Praxis: Intact Cognition Overall Cognitive  Status: Within Functional Limits for tasks assessed Arousal/Alertness: Awake/alert Memory: Appears intact Problem Solving: Appears intact Safety/Judgment: Appears intact Sensation Sensation Light Touch: Appears Intact Hot/Cold: Appears Intact Proprioception: Appears Intact Stereognosis: Appears Intact Coordination Gross Motor Movements are Fluid and Coordinated: No Fine Motor Movements are Fluid and Coordinated: No Coordination and Movement Description: Slight decreased efficiency with RUE functional use secondary to wrist fx and need for wrist cock up splint.  She also has history of rotator cuff impairment as well which limits efficiecy with overhead tasks. Motor  Motor Motor: Within Functional Limits Motor - Discharge Observations: Still with generalized weakness, requires rest breaks after 2-3 mins of standing or when completing functional mobility. Mobility  Bed Mobility Bed Mobility: Sit to Supine;Supine to Sit Supine to Sit: Independent Sit to Supine: Independent Transfers Sit to Stand: Independent with assistive device Stand to Sit: Independent with assistive device  Trunk/Postural Assessment  Cervical Assessment Cervical Assessment: Exceptions to [facility] (slight forward head) Thoracic Assessment Thoracic Assessment: Exceptions to [facility] (thoracic rounding) Lumbar Assessment Lumbar Assessment: Exceptions to [facility] (slight posterior pelvic tilt)  Balance Balance Balance Assessed: Yes Static Sitting Balance Static Sitting - Balance Support: Feet supported Static Sitting - Level of Assistance: 7: Independent Dynamic Sitting Balance Dynamic Sitting - Balance Support: During functional activity Dynamic Sitting - Level of Assistance: 6: Modified independent (Device/Increase time) Static Standing Balance Static Standing -  Balance Support: During functional activity;Right upper extremity supported Static Standing - Level of Assistance: 6: Modified independent (Device/Increase time) Dynamic Standing Balance Dynamic Standing - Balance Support: During functional activity;Bilateral upper extremity supported Dynamic Standing - Level of Assistance: 5: Stand by assistance Extremity/Trunk Assessment RUE Assessment RUE Assessment: Exceptions to [facility] Active Range of Motion (AROM) Comments: [facility] Strength Comments: shoulder flexion 3/5 secondary to rotator cuff weakness with elbow flexion/extension 4/5 and grip 3+/5 with wrist cock up splint in place. LUE Assessment LUE Assessment: Within Functional Limits   Audyn Dimercurio OTR/L 09/04/2020, 4:38 PM

## 2020-09-04 NOTE — Discharge Summary (Signed)
Physical Therapy Discharge Summary  Patient Details  Name: TASHE PURDON MRN: [id number] Date of Birth: 1932-10-08  Today's Date: 09/04/2020 PT Individual Time: 5284-1324 + 1400-1443 PT Individual Time Calculation (min): 56 min  + 43 min   Patient has met 9 of 9 long term goals due to improved activity tolerance, improved balance, increased strength, decreased pain and ability to compensate for deficits.  Patient to discharge at an ambulatory level Supervision  Patient's care partner is independent to provide the necessary physical and cognitive assistance at discharge.  Reasons goals not met: n/a  Recommendation:  Patient will benefit from ongoing skilled PT services in home health setting to continue to advance safe functional mobility, address ongoing impairments in balance, gait, endurance, RLE weakness, home safety training, and caregiver training in order to minimize fall risk.  Equipment: Platform attachment for her RW  Reasons for discharge: treatment goals met and discharge from hospital  Patient/family agrees with progress made and goals achieved: Yes  PT Discharge Precautions/Restrictions Precautions Precautions: Fall;Other (comment) Precaution Comments: NWB in RUE, can WB through elbow as tolerated. Required Braces or Orthoses: Splint/Cast Splint/Cast: R cockup wrist splint. Restrictions Weight Bearing Restrictions: Yes RUE Weight Bearing: Weight bear through elbow only Other Position/Activity Restrictions: ok to WB through elbow on platform Vision/Perception  Perception Perception: Within Functional Limits Praxis Praxis: Intact  Cognition Overall Cognitive Status: Within Functional Limits for tasks assessed Arousal/Alertness: Awake/alert Orientation Level: Oriented X4 Memory: Appears intact Awareness: Appears intact Problem Solving: Appears intact Safety/Judgment: Appears intact (Occasionally needs verbal cues for NWB through hand on RUE during functional  mobility tasks - however, this has improved since date of evaluatoin) Sensation Sensation Light Touch: Appears Intact Hot/Cold: Appears Intact Proprioception: Appears Intact Stereognosis: Appears Intact Coordination Gross Motor Movements are Fluid and Coordinated: No Fine Motor Movements are Fluid and Coordinated: Yes Coordination and Movement Description: Gross movement patterns impacted by R hip/knee pain as she fell on her R side - XR's have been done showing no acute fx's Motor  Motor Motor: Within Functional Limits Motor - Discharge Observations: Generalized weakness and deconditioning that has improved since date of evaluation. Per family, pt has been somewhat sedentary since [place] pandemic began ~2 years ago  Mobility Bed Mobility Bed Mobility: Rolling Right;Rolling Left;Supine to Sit;Sit to Supine Rolling Right: Independent Rolling Left: Independent Supine to Sit: Independent Sit to Supine: Independent Transfers Transfers: Sit to Stand;Stand to Sit;Stand Pivot Transfers Sit to Stand: Independent with assistive device (PFRW) Stand to Sit: Independent with assistive device (PFRW) Stand Pivot Transfers: Independent with assistive device (PFRW) Transfer (Assistive device): Right platform walker Locomotion  Gait Ambulation: Yes Gait Assistance: Supervision/Verbal cueing Gait Distance (Feet): 150 Feet Assistive device: Right platform walker Gait Assistance Details: Verbal cues for gait pattern;Verbal cues for safe use of DME/AE;Verbal cues for sequencing;Verbal cues for technique;Verbal cues for precautions/safety Gait Assistance Details: Cues for keeping body within walker frame and slowing down as she tends to rush things at times Gait Gait: Yes Gait Pattern: Impaired Gait Pattern: Decreased step length - left;Decreased stance time - right;Decreased dorsiflexion - right;Trunk flexed;Wide base of support;Step-through pattern Gait velocity: decreased Stairs / Additional  Locomotion Stairs: Yes Stairs Assistance: Minimal Assistance - Patient > 75% Stair Management Technique: One rail Left Number of Stairs: 12 Height of Stairs: 6 Ramp: Contact Guard/touching assist Wheelchair Mobility Wheelchair Mobility: No  Trunk/Postural Assessment  Cervical Assessment Cervical Assessment: Within Functional Limits Thoracic Assessment Thoracic Assessment: Within Functional Limits Lumbar Assessment Lumbar Assessment: Within Functional  Limits Postural Control Postural Control: Within Functional Limits  Balance Balance Balance Assessed: Yes Standardized Balance Assessment Standardized Balance Assessment: Berg Balance Test Berg Balance Test Sit to Stand: Able to stand without using hands and stabilize independently Standing Unsupported: Able to stand safely 2 minutes Sitting with Back Unsupported but Feet Supported on Floor or Stool: Able to sit safely and securely 2 minutes Stand to Sit: Sits safely with minimal use of hands Transfers: Able to transfer safely, definite need of hands Standing Unsupported with Eyes Closed: Able to stand 10 seconds safely Standing Ubsupported with Feet Together: Able to place feet together independently and stand for 1 minute with supervision From Standing, Reach Forward with Outstretched Arm: Can reach forward >12 cm safely (5") From Standing Position, Pick up Object from Floor: Able to pick up shoe safely and easily From Standing Position, Turn to Look Behind Over each Shoulder: Looks behind from both sides and weight shifts well Turn 360 Degrees: Needs close supervision or verbal cueing Standing Unsupported, Alternately Place Feet on Step/Stool: Needs assistance to keep from falling or unable to try Standing Unsupported, One Foot in Front: Able to take small step independently and hold 30 seconds Standing on One Leg: Unable to try or needs assist to prevent fall Total Score: 40/56 Extremity Assessment  RLE Assessment RLE  Assessment: Exceptions to [facility] Strength Comments: Limited by pain. Grossly 4-/5 LLE Assessment LLE Assessment: Within Functional Limits  Skilled Intervention:  1st session: Pt greeted seated in w/c at start of session, agreeable to PT tx. No reports of pain and reports eagerness and readiness for tomorrow's DC home. W/c transport for time management to main rehab gym. Completed stair training, going up/down x12 steps with minA and 1 handrail on L - cues for step-to pattern with L foot leading ascent and R foot leading descent - increased difficulty with ascent > descent. Completed BERG as outlined above (pt needing several seated rest breaks during this). She scored 30/56 on 5/19 and this score improved to 40/56 on 5/24, indicating reduced falls risk. BERG score most impacted by ability to place feet on stool unsupported, standing on 1 leg, and tandem stance.  Patient demonstrates increased fall risk as noted by score of  40/56 on Berg Balance Scale.  (<36= high risk for falls, close to 100%; 37-45 significant >80%; 46-51 moderate >50%; 52-55 lower >25%)  Gait training on level ground with PFRW, ambulating ~129f with supervision - continues to demonstrate mild antalgia with R hip/knee but no formal LOB or knee buckling present. Gait quality begins to fade after ~1548fand provided education on energy conservation strategies and fall prevention strategies at home. Pt appreciative/receptive of feedback and education.   Completed 10 minutes of Nustep at workload 3, using BLE's only, focusing on general endurance and BLE strengthening. Cues for monitoring knee alignment to neutral and full hip & knee extension. Stand<>pivot mod I with PFRW to w/c and she was wheeled back to room for energy conservation. Completed stand<>pivot transfer with CGA and no AD back to bed and able to complete sit>supine mod I with bed features. Pt remained supine with needs in reach and bed alarm on, pt thankful and  appreciative.   2nd session: Pt greeted in bed at start of session and agreeable to PT tx. No reports of pain. Daughter at bedside and reviewed DC planning for tomorrow - all questions/concerns addressed. Supine<>sit mod I with bed features. Stand<>pivot transfer with CGA and no AD from EOB to w/c -  reminded pt and daughter that she should always use RW for transfers to improve mod I level. Wheeled in w/c to ortho rehab gym for time management. Completed car transfer with supervision with [place] - able to approach car safely with good ability to manage LE's in/out of car. Practiced ambulating up/down 68f ramp with CGA and PFRW - cues for safety awareness especially with descent to reduce risk of PFRW getting too far away from her. Completed Geoboard task at [employer] with 10 second memory component -  Pt with 75% accuracy with mild complexity and ~50% accuracy with more complex. Completed standing with PFRW support and task overlay for tossing horseshoes with LUE, working on reaching outside BOS and righting/stepping reactions. Pt wheeled back to room and agreeable to remain seated in w/c for upcoming OT session. Needs in reach and daughter at bedside.   Tristyn Pharris P Shermon Bozzi PT, DPT 09/04/2020, 7:59 AM

## 2020-09-04 NOTE — Progress Notes (Signed)
We will [place] PHYSICAL MEDICINE & REHABILITATION PROGRESS NOTE  Subjective/Complaints: Patient seen sitting up in bed this morning working with therapies.  She states she slept well overnight.  She is looking forward to discharge tomorrow.  ROS: Denies CP, SOB, N/V/D  Objective: Vital Signs: Blood pressure 105/63, pulse 68, temperature 98.2 F (36.8 C), temperature source Oral, resp. rate 18, weight 74.5 kg, SpO2 96 %. No results found. No results for input(s): WBC, HGB, HCT, PLT in the last 72 hours. No results for input(s): NA, K, CL, CO2, GLUCOSE, BUN, CREATININE, CALCIUM in the last 72 hours.  Intake/Output Summary (Last 24 hours) at 09/04/2020 1243 Last data filed at 09/04/2020 0700 Gross per 24 hour  Intake 236 ml  Output --  Net 236 ml        Physical Exam: BP 105/63 (BP Location: Left Arm)   Pulse 68   Temp 98.2 F (36.8 C) (Oral)   Resp 18   Wt 74.5 kg   SpO2 96%   BMI 27.76 kg/m   Constitutional: No distress . Vital signs reviewed. HENT: Facial trauma.  Right periorbital edema. Eyes: EOMI. No discharge. Cardiovascular: No JVD.  RRR. Respiratory: Normal effort.  No stridor.  Bilateral clear to auscultation. GI: Non-distended.  BS +. Skin: Warm and dry.  Intact. Psych: Normal mood.  Normal behavior. Musc: Right wrist with edema and tenderness, improving Neuro: Unchanged Makes eye contact with examiner.   Motor: LUE: 5/5 proximal distal Bilateral lower extremities: 4-4+/5 proximal distal, improving Right upper extremity: Shoulder abduction 4+/5, distally limited due to splint, unchanged   Assessment/Plan: 1. Functional deficits which require 3+ hours per day of interdisciplinary therapy in a comprehensive inpatient rehab setting.  Physiatrist is providing close team supervision and 24 hour management of active medical problems listed below.  Physiatrist and rehab team continue to assess barriers to discharge/monitor patient progress toward functional  and medical goals   Care Tool:  Bathing    Body parts bathed by patient: Chest,Abdomen,Front perineal area,Buttocks,Right upper leg,Left upper leg,Face,Right arm,Left arm,Right lower leg,Left lower leg   Body parts bathed by helper: Right lower leg,Left lower leg,Left arm     Bathing assist Assist Level: Supervision/Verbal cueing     Upper Body Dressing/Undressing Upper body dressing   What is the patient wearing?: Button up shirt    Upper body assist Assist Level: Supervision/Verbal cueing    Lower Body Dressing/Undressing Lower body dressing      What is the patient wearing?: Underwear/pull up,Pants     Lower body assist Assist for lower body dressing: Supervision/Verbal cueing     Toileting Toileting    Toileting assist Assist for toileting: Supervision/Verbal cueing     Transfers Chair/bed transfer  Transfers assist     Chair/bed transfer assist level: Supervision/Verbal cueing     Locomotion Ambulation   Ambulation assist      Assist level: Contact Guard/Touching assist Assistive device: Walker-platform Max distance: 30'   Walk 10 feet activity   Assist     Assist level: Contact Guard/Touching assist Assistive device: Walker-platform   Walk 50 feet activity   Assist    Assist level: Contact Guard/Touching assist Assistive device: Walker-platform    Walk 150 feet activity   Assist Walk 150 feet activity did not occur: Safety/medical concerns  Assist level: Minimal Assistance - Patient > 75% Assistive device: Walker-platform    Walk 10 feet on uneven surface  activity   Assist Walk 10 feet on uneven surfaces activity did  not occur: Safety/medical concerns         Wheelchair     Assist Will patient use wheelchair at discharge?: No   Wheelchair activity did not occur: N/A         Wheelchair 50 feet with 2 turns activity    Assist    Wheelchair 50 feet with 2 turns activity did not occur: N/A        Wheelchair 150 feet activity     Assist  Wheelchair 150 feet activity did not occur: N/A        Medical Problem List and Plan: 1.  Traumatic SAH/ICH, right orbital fracture as well as right wrist fracture secondary to mechanical fall 08/18/2020.  Status post reconstruction of orbital floor 08/24/2020  Continue CIR 2.  Antithrombotics: -DVT/anticoagulation: SCDs             -antiplatelet therapy: N/A 3. Post-surgical pain: Decrease oxycodone to q6H as needed as well as Flexeril- patient agreeable  Controlled with meds on 5/24  Monitor with increased exertion 4. Mood: Provide emotional support             -antipsychotic agents: N/A 5. Neuropsych: This patient is capable of making decisions on her own behalf. 6. Skin/Wound Care: Routine skin checks 7. Fluids/Electrolytes/Nutrition: Routine in and outs 8.  Right nondisplaced fracture of distal radius.    Nonweightbearing at the wrist.  Cock up splint.  Patient is using a platform walker.  No surgical intervention 9.  Hypertension.  Patient on Corgard 40 mg daily prior to admission stopped due to bradycardia.    Continue Corgard 20 started on 5/20  Controlled on 5/24  Monitor with increased mobility 10.  GERD: Protonix 11.  Slow transit constipation.  Senokot daily, Dulcolax tablet daily as needed  Improved 12. Right knee severe OA: continue voltaren gel.  13.  Hypoalbuminemia  Supplement initiated on 5/17   LOS: 8 days A FACE TO FACE EVALUATION WAS PERFORMED  Sydney [name] Lorie Phenix 09/04/2020, 12:43 PM

## 2020-09-04 NOTE — Progress Notes (Signed)
Physical Therapy Session Note  Patient Details  Name: Sydney [name] MRN: [id number] Date of Birth: 1932-06-12  Today's Date: 09/04/2020 PT Individual Time: 1400-1443 PT Individual Time Calculation (min): 43 min   Short Term Goals: Week 1:  PT Short Term Goal 1 (Week 1): STG = LTG due to ELOS  Skilled Therapeutic Interventions/Progress Updates:     Pt received supine in bed and agrees to therapy. No complaint of pain. Pt performs supine to sit with supervision and cues on positioning. Stand pivot transfer to [facility] with platform RW and supervision with cues on sequencing. WC transfer to gym for time management. Pt performs multiple reps of sit to stand with RW. In standing, pt performs slow marching with cues to engage hip flexors for strengthening, 1x20. Pt then performs 1x25 heel raises. Seated rest break and then pt performs 2x15 hamstring curls and 1x20 toe raises with cues on body mechanics and optimal performance. Pt performs stand pivot transfer back to bd with supervision. Left supine with all needs within reach.  Therapy Documentation Precautions:  Precautions Precautions: Fall,Other (comment) Precaution Comments: NWB in RUE, can WB through elbow as tolerated. Required Braces or Orthoses: Splint/Cast Splint/Cast: R cockup wrist splint. Restrictions Weight Bearing Restrictions: Yes RUE Weight Bearing: Weight bear through elbow only Other Position/Activity Restrictions: ok to WB through elbow on platform   Therapy/Group: Individual Therapy  Breck Coons, PT, DPT 09/04/2020, 4:05 PM

## 2020-09-04 NOTE — Progress Notes (Signed)
Patient ID: Sydney [name], female   DOB: May 04, 1932, 87 y.o.   MRN: [id number] Follow up with the patient regarding pending discharge and hypophosphatemia and supplements added with dietary modification tips. Handouts given for foods higher in phosphorous and protein and review prediabetes and dyslipdemia with the patient and daughter. Discussed various foods that would address all of the deficiencies and be something the patient would like to eat. Patient and daughter reported comfort with the information reviewed. Margarito Liner

## 2020-09-05 ENCOUNTER — Telehealth: Payer: Self-pay

## 2020-09-05 DIAGNOSIS — I10 Essential (primary) hypertension: Secondary | ICD-10-CM | POA: Diagnosis not present

## 2020-09-05 DIAGNOSIS — S066X0A Traumatic subarachnoid hemorrhage without loss of consciousness, initial encounter: Secondary | ICD-10-CM

## 2020-09-05 DIAGNOSIS — S066X0D Traumatic subarachnoid hemorrhage without loss of consciousness, subsequent encounter: Secondary | ICD-10-CM | POA: Diagnosis not present

## 2020-09-05 DIAGNOSIS — G8918 Other acute postprocedural pain: Secondary | ICD-10-CM | POA: Diagnosis not present

## 2020-09-05 NOTE — Progress Notes (Addendum)
  INPATIENT REHABILITATION DISCHARGE NOTE   Discharge instructions by: Linna Hoff PA  Verbalized understanding: yes   Skin care/Wound care: Pt showered this AM  Pain: 0-10 (0)  IV's: None  Tubes/Drains: None  Safety instructions: Explained to pt  Patient belongings: Gathered  Discharged to: Home  Discharged via: Sports coach.  Notes: PA Dan in with pt/family to discuss discharge instructions. Pt/family in agreement. Questions answered. Pt belongings gathered. Pt left per wheelchair to private vehicle. No complications noted.    Sheela Stack, LPN

## 2020-09-05 NOTE — Progress Notes (Signed)
Inpatient Rehabilitation Care Coordinator Discharge Note  The overall goal for the admission was met for:   Discharge location: Per EMR, pt to d/c to home with granddaughter and grandson who will be moving in with her so someone will be there with her at discharge.   Length of Stay: Yes. 8 days.   Discharge activity level: Yes. Supervision.  Home/community participation: Yes. Limited.   Services provided included: MD, RD, PT, OT, RN, CM, TR, Pharmacy, Neuropsych and SW  Financial Services: Private Insurance: [employer]  Choices offered to/list presented to:Yes  Follow-up services arranged: Home Health: [facility] for HHPT/OT, DME: [employer] for R-platform RW and Patient/Family request agency HH: [facility], DME: N/A  Comments (or additional information):  Patient/Family verbalized understanding of follow-up arrangements: Yes  Individual responsible for coordination of the follow-up plan: contact pt [phone]  Confirmed correct DME delivered: Rana Snare 09/05/2020    Rana Snare

## 2020-09-05 NOTE — Progress Notes (Signed)
We will [place] PHYSICAL MEDICINE & REHABILITATION PROGRESS NOTE  Subjective/Complaints: Patient seen sitting up this AM.  She states she slept well overnight.  She is ready for discharge. Daughter with questions regarding use of sling.   ROS: Denies CP, SOB, N/V/D  Objective: Vital Signs: Blood pressure 138/69, pulse 65, temperature 98.3 F (36.8 C), temperature source Oral, resp. rate 18, weight 74.5 kg, SpO2 95 %. No results found. No results for input(s): WBC, HGB, HCT, PLT in the last 72 hours. No results for input(s): NA, K, CL, CO2, GLUCOSE, BUN, CREATININE, CALCIUM in the last 72 hours.  Intake/Output Summary (Last 24 hours) at 09/05/2020 1117 Last data filed at 09/05/2020 0750 Gross per 24 hour  Intake 598 ml  Output --  Net 598 ml        Physical Exam: BP 138/69 (BP Location: Left Arm)   Pulse 65   Temp 98.3 F (36.8 C) (Oral)   Resp 18   Wt 74.5 kg   SpO2 95%   BMI 27.76 kg/m   Constitutional: No distress . Vital signs reviewed. HENT: Facial trauma. Right periorbital edema.  Eyes: EOMI. No discharge. Cardiovascular: No JVD.  RRR. Respiratory: Normal effort.  No stridor.  Bilateral clear to auscultation. GI: Non-distended.  BS +. Skin: Warm and dry.  Intact. Psych: Normal mood.  Normal behavior. Musc: Right wrist with edema and tenderness, improving Neuro: Alert Makes eye contact with examiner.   Motor: LUE: 5/5 proximal distal Bilateral lower extremities: 4-4+/5 proximal distal, stable Right upper extremity: Shoulder abduction 4+/5, distally limited due to splint, unchanged   Assessment/Plan: 1. Functional deficits which require 3+ hours per day of interdisciplinary therapy in a comprehensive inpatient rehab setting.  Physiatrist is providing close team supervision and 24 hour management of active medical problems listed below.  Physiatrist and rehab team continue to assess barriers to discharge/monitor patient progress toward functional and  medical goals   Care Tool:  Bathing    Body parts bathed by patient: Chest,Abdomen,Front perineal area,Buttocks,Right upper leg,Left upper leg,Face,Right arm,Left arm,Right lower leg,Left lower leg   Body parts bathed by helper: Right lower leg,Left lower leg,Left arm     Bathing assist Assist Level: Supervision/Verbal cueing     Upper Body Dressing/Undressing Upper body dressing   What is the patient wearing?: Pull over shirt    Upper body assist Assist Level: Independent    Lower Body Dressing/Undressing Lower body dressing      What is the patient wearing?: Underwear/pull up,Pants     Lower body assist Assist for lower body dressing: Independent with assitive device     Toileting Toileting    Toileting assist Assist for toileting: Independent with assistive device     Transfers Chair/bed transfer  Transfers assist     Chair/bed transfer assist level: Independent with assistive device     Locomotion Ambulation   Ambulation assist      Assist level: Contact Guard/Touching assist Assistive device: Walker-platform Max distance: 30'   Walk 10 feet activity   Assist     Assist level: Contact Guard/Touching assist Assistive device: Walker-platform   Walk 50 feet activity   Assist    Assist level: Contact Guard/Touching assist Assistive device: Walker-platform    Walk 150 feet activity   Assist Walk 150 feet activity did not occur: Safety/medical concerns  Assist level: Minimal Assistance - Patient > 75% Assistive device: Walker-platform    Walk 10 feet on uneven surface  activity   Assist Walk 10  feet on uneven surfaces activity did not occur: Safety/medical concerns         Wheelchair     Assist Will patient use wheelchair at discharge?: No   Wheelchair activity did not occur: N/A         Wheelchair 50 feet with 2 turns activity    Assist    Wheelchair 50 feet with 2 turns activity did not occur: N/A        Wheelchair 150 feet activity     Assist  Wheelchair 150 feet activity did not occur: N/A        Medical Problem List and Plan: 1.  Traumatic SAH/ICH, right orbital fracture as well as right wrist fracture secondary to mechanical fall 08/18/2020.  Status post reconstruction of orbital floor 08/24/2020  DC today  Will see patient for follow up hospital follow up in 1 month post-discharge 2.  Antithrombotics: -DVT/anticoagulation: SCDs             -antiplatelet therapy: N/A 3. Post-surgical pain: Decrease oxycodone to q6H as needed as well as Flexeril- patient agreeable  Controlled with meds on 5/25  Monitor with increased exertion 4. Mood: Provide emotional support             -antipsychotic agents: N/A 5. Neuropsych: This patient is capable of making decisions on her own behalf. 6. Skin/Wound Care: Routine skin checks 7. Fluids/Electrolytes/Nutrition: Routine in and outs 8.  Right nondisplaced fracture of distal radius.    Nonweightbearing at the wrist.  Cock up splint.  Patient is using a platform walker.  No surgical intervention 9.  Hypertension.  Patient on Corgard 40 mg daily prior to admission stopped due to bradycardia.    Continue Corgard 20 started on 5/20  Controlled on 5/25  Monitor with increased mobility 10.  GERD: Protonix 11.  Slow transit constipation.  Senokot daily, Dulcolax tablet daily as needed  Improved 12. Right knee severe OA: continue voltaren gel.  13.  Hypoalbuminemia  Supplement initiated on 5/17  > 30 minutes spent in total in discharge planning between myself and PA regarding aforementioned, as well discussion regarding DME equipment, follow-up appointments, follow-up therapies, discharge medications, discharge recommendations, answering questions.  Please see discharge summary as well.  LOS: 9 days A FACE TO FACE EVALUATION WAS PERFORMED  Kathyann Spaugh Lorie Phenix 09/05/2020, 11:17 AM

## 2020-09-05 NOTE — Telephone Encounter (Signed)
I consulted with Dr. Claudia Desanctis- & he does suggest having pt return to the office for f/u visit with him in approx 3-4 weeks after pt is d/c from hospital. I forwarded this information to our scheduling team & they will contact pt or her daughter to arrange this f/u visit.

## 2020-09-06 ENCOUNTER — Telehealth: Payer: Self-pay

## 2020-09-06 ENCOUNTER — Telehealth: Payer: Self-pay | Admitting: Internal Medicine

## 2020-09-06 NOTE — Telephone Encounter (Signed)
Pt called to ask for [facility] CMA to call her back. When asked what it was regarding, she stated she was just discharged from Rehab and needs to get set up with home health. I advised her that she would need an appointment and she refused. She only wants to receive a phone call from [facility].

## 2020-09-06 NOTE — Telephone Encounter (Signed)
Transition Care Management Follow-up Telephone Call  Date of discharge and from where: [facility]   How have you been since you were released from the hospital? Patient states that she is doing very well. No complaints at this time.   Any questions or concerns? No  Items Reviewed:  Did the pt receive and understand the discharge instructions provided? Yes   Medications obtained and verified? Yes   Other? No   Any new allergies since your discharge? No   Dietary orders reviewed? Yes  Do you have support at home? Yes   Home Care and Equipment/Supplies: Were home health services ordered? yes If so, what is the name of the agency? Home health, [facility] Has the agency set up a time to come to the patient's home? yes Were any new equipment or medical supplies ordered?  No What is the name of the medical supply agency? N/A Were you able to get the supplies/equipment? not applicable Do you have any questions related to the use of the equipment or supplies? No  Functional Questionnaire: (I = Independent and D = Dependent) ADLs: I  Bathing/Dressing- I  Meal Prep- I  Eating- I  Maintaining continence- I  Transferring/Ambulation- I  Managing Meds- I  Follow up appointments reviewed:   PCP Hospital f/u appt confirmed? No  Patient refused appointment at this time.   [facility] f/u appt confirmed? Yes  Scheduled to see Danella Sensing on 09/18/2020 @ 2:20 pm.  Are transportation arrangements needed? No   If their condition worsens, is the pt aware to call PCP or go to the Emergency Dept.? Yes  Was the patient provided with contact information for the PCP's office or ED? Yes  Was to pt encouraged to call back with questions or concerns? Yes

## 2020-09-06 NOTE — Telephone Encounter (Signed)
Spoke to pt. She said the hospital ordered Home Health through [facility] and she had not heard anything from them. I advised her to give them some time if it was just sent to them. They will contact her.

## 2020-09-06 NOTE — Telephone Encounter (Signed)
Noted---thanks. I have made home visits there for her husband in the past--I might offer that again

## 2020-09-08 DIAGNOSIS — S066X9D Traumatic subarachnoid hemorrhage with loss of consciousness of unspecified duration, subsequent encounter: Secondary | ICD-10-CM | POA: Diagnosis not present

## 2020-09-08 DIAGNOSIS — M80031D Age-related osteoporosis with current pathological fracture, right forearm, subsequent encounter for fracture with routine healing: Secondary | ICD-10-CM | POA: Diagnosis not present

## 2020-09-08 DIAGNOSIS — M800AXD Age-related osteoporosis with current pathological fracture, other site, subsequent encounter for fracture with routine healing: Secondary | ICD-10-CM | POA: Diagnosis not present

## 2020-09-08 DIAGNOSIS — K219 Gastro-esophageal reflux disease without esophagitis: Secondary | ICD-10-CM | POA: Diagnosis not present

## 2020-09-08 DIAGNOSIS — M48061 Spinal stenosis, lumbar region without neurogenic claudication: Secondary | ICD-10-CM | POA: Diagnosis not present

## 2020-09-08 DIAGNOSIS — M353 Polymyalgia rheumatica: Secondary | ICD-10-CM | POA: Diagnosis not present

## 2020-09-08 DIAGNOSIS — M5137 Other intervertebral disc degeneration, lumbosacral region: Secondary | ICD-10-CM | POA: Diagnosis not present

## 2020-09-08 DIAGNOSIS — I129 Hypertensive chronic kidney disease with stage 1 through stage 4 chronic kidney disease, or unspecified chronic kidney disease: Secondary | ICD-10-CM | POA: Diagnosis not present

## 2020-09-08 DIAGNOSIS — N1831 Chronic kidney disease, stage 3a: Secondary | ICD-10-CM | POA: Diagnosis not present

## 2020-09-12 ENCOUNTER — Telehealth: Payer: Self-pay

## 2020-09-12 NOTE — Telephone Encounter (Signed)
Verbal orders left on verified VM  

## 2020-09-12 NOTE — Telephone Encounter (Signed)
Okay 

## 2020-09-12 NOTE — Telephone Encounter (Signed)
Requesting verbal orders for [facility] PT once a week for 9 weeks. Please advise.

## 2020-09-13 ENCOUNTER — Telehealth: Payer: Self-pay

## 2020-09-13 NOTE — Telephone Encounter (Signed)
Returned patients call. She will need to cancel her follow up appointment on 10/03/20. She is waiting to have PT first. She will call back to schedule appointment.

## 2020-09-13 NOTE — Telephone Encounter (Signed)
I called the patient to schedule a 2-week follow-up per [place], RN.  Patient said that she will not be able to come in for an appointment until she has some therapy.  Patient said that Gennaro Africa [phone]) from [facility] did come by to give her an evaluation, but she hasn't been back.  Patient's understanding is that she was supposed to have therapy 2-3 times per week.  Patient said that she has called [facility] once a day for the last three days and was told that it was pending and they will call her back, but she hasn't heard from anyone.  Please call.

## 2020-09-14 ENCOUNTER — Telehealth: Payer: Self-pay

## 2020-09-14 DIAGNOSIS — M353 Polymyalgia rheumatica: Secondary | ICD-10-CM | POA: Diagnosis not present

## 2020-09-14 DIAGNOSIS — M48061 Spinal stenosis, lumbar region without neurogenic claudication: Secondary | ICD-10-CM | POA: Diagnosis not present

## 2020-09-14 DIAGNOSIS — I129 Hypertensive chronic kidney disease with stage 1 through stage 4 chronic kidney disease, or unspecified chronic kidney disease: Secondary | ICD-10-CM | POA: Diagnosis not present

## 2020-09-14 DIAGNOSIS — N1831 Chronic kidney disease, stage 3a: Secondary | ICD-10-CM | POA: Diagnosis not present

## 2020-09-14 DIAGNOSIS — M5137 Other intervertebral disc degeneration, lumbosacral region: Secondary | ICD-10-CM | POA: Diagnosis not present

## 2020-09-14 DIAGNOSIS — K219 Gastro-esophageal reflux disease without esophagitis: Secondary | ICD-10-CM | POA: Diagnosis not present

## 2020-09-14 DIAGNOSIS — M800AXD Age-related osteoporosis with current pathological fracture, other site, subsequent encounter for fracture with routine healing: Secondary | ICD-10-CM | POA: Diagnosis not present

## 2020-09-14 DIAGNOSIS — S066X9D Traumatic subarachnoid hemorrhage with loss of consciousness of unspecified duration, subsequent encounter: Secondary | ICD-10-CM | POA: Diagnosis not present

## 2020-09-14 DIAGNOSIS — M80031D Age-related osteoporosis with current pathological fracture, right forearm, subsequent encounter for fracture with routine healing: Secondary | ICD-10-CM | POA: Diagnosis not present

## 2020-09-14 NOTE — Telephone Encounter (Signed)
Requesting home health OT orders 1x week for 7 weeks, and recommends that she get referral to ortho for follow up on wrist fracture was not made at hospital.

## 2020-09-16 NOTE — Telephone Encounter (Signed)
Okay on the OT See if she has a preference on ortho---may be able to just see Dr Lorelei Pont. Have someone put in referral if needed

## 2020-09-17 NOTE — Telephone Encounter (Signed)
Left verbal orders for Sydney [name] ov VM. Spoke to pt. She said she is feeling fine with her wrist. If she has any issues with it she will call to make appt with Dr Lorelei Pont.

## 2020-09-18 ENCOUNTER — Encounter: Payer: Medicare PPO | Admitting: Registered Nurse

## 2020-09-18 DIAGNOSIS — M800AXD Age-related osteoporosis with current pathological fracture, other site, subsequent encounter for fracture with routine healing: Secondary | ICD-10-CM | POA: Diagnosis not present

## 2020-09-18 DIAGNOSIS — S066X9D Traumatic subarachnoid hemorrhage with loss of consciousness of unspecified duration, subsequent encounter: Secondary | ICD-10-CM | POA: Diagnosis not present

## 2020-09-18 DIAGNOSIS — K219 Gastro-esophageal reflux disease without esophagitis: Secondary | ICD-10-CM | POA: Diagnosis not present

## 2020-09-18 DIAGNOSIS — M353 Polymyalgia rheumatica: Secondary | ICD-10-CM | POA: Diagnosis not present

## 2020-09-18 DIAGNOSIS — M80031D Age-related osteoporosis with current pathological fracture, right forearm, subsequent encounter for fracture with routine healing: Secondary | ICD-10-CM | POA: Diagnosis not present

## 2020-09-18 DIAGNOSIS — N1831 Chronic kidney disease, stage 3a: Secondary | ICD-10-CM | POA: Diagnosis not present

## 2020-09-18 DIAGNOSIS — M5137 Other intervertebral disc degeneration, lumbosacral region: Secondary | ICD-10-CM | POA: Diagnosis not present

## 2020-09-18 DIAGNOSIS — M48061 Spinal stenosis, lumbar region without neurogenic claudication: Secondary | ICD-10-CM | POA: Diagnosis not present

## 2020-09-18 DIAGNOSIS — I129 Hypertensive chronic kidney disease with stage 1 through stage 4 chronic kidney disease, or unspecified chronic kidney disease: Secondary | ICD-10-CM | POA: Diagnosis not present

## 2020-09-21 DIAGNOSIS — M353 Polymyalgia rheumatica: Secondary | ICD-10-CM | POA: Diagnosis not present

## 2020-09-21 DIAGNOSIS — M48061 Spinal stenosis, lumbar region without neurogenic claudication: Secondary | ICD-10-CM | POA: Diagnosis not present

## 2020-09-21 DIAGNOSIS — K219 Gastro-esophageal reflux disease without esophagitis: Secondary | ICD-10-CM | POA: Diagnosis not present

## 2020-09-21 DIAGNOSIS — S066X9D Traumatic subarachnoid hemorrhage with loss of consciousness of unspecified duration, subsequent encounter: Secondary | ICD-10-CM | POA: Diagnosis not present

## 2020-09-21 DIAGNOSIS — I129 Hypertensive chronic kidney disease with stage 1 through stage 4 chronic kidney disease, or unspecified chronic kidney disease: Secondary | ICD-10-CM | POA: Diagnosis not present

## 2020-09-21 DIAGNOSIS — M5137 Other intervertebral disc degeneration, lumbosacral region: Secondary | ICD-10-CM | POA: Diagnosis not present

## 2020-09-21 DIAGNOSIS — M800AXD Age-related osteoporosis with current pathological fracture, other site, subsequent encounter for fracture with routine healing: Secondary | ICD-10-CM | POA: Diagnosis not present

## 2020-09-21 DIAGNOSIS — N1831 Chronic kidney disease, stage 3a: Secondary | ICD-10-CM | POA: Diagnosis not present

## 2020-09-21 DIAGNOSIS — M80031D Age-related osteoporosis with current pathological fracture, right forearm, subsequent encounter for fracture with routine healing: Secondary | ICD-10-CM | POA: Diagnosis not present

## 2020-09-24 DIAGNOSIS — Z9181 History of falling: Secondary | ICD-10-CM

## 2020-09-24 DIAGNOSIS — M159 Polyosteoarthritis, unspecified: Secondary | ICD-10-CM

## 2020-09-24 DIAGNOSIS — M800AXD Age-related osteoporosis with current pathological fracture, other site, subsequent encounter for fracture with routine healing: Secondary | ICD-10-CM | POA: Diagnosis not present

## 2020-09-24 DIAGNOSIS — I723 Aneurysm of iliac artery: Secondary | ICD-10-CM

## 2020-09-24 DIAGNOSIS — N1831 Chronic kidney disease, stage 3a: Secondary | ICD-10-CM | POA: Diagnosis not present

## 2020-09-24 DIAGNOSIS — E785 Hyperlipidemia, unspecified: Secondary | ICD-10-CM

## 2020-09-24 DIAGNOSIS — H9319 Tinnitus, unspecified ear: Secondary | ICD-10-CM

## 2020-09-24 DIAGNOSIS — M353 Polymyalgia rheumatica: Secondary | ICD-10-CM | POA: Diagnosis not present

## 2020-09-24 DIAGNOSIS — M48061 Spinal stenosis, lumbar region without neurogenic claudication: Secondary | ICD-10-CM | POA: Diagnosis not present

## 2020-09-24 DIAGNOSIS — K219 Gastro-esophageal reflux disease without esophagitis: Secondary | ICD-10-CM | POA: Diagnosis not present

## 2020-09-24 DIAGNOSIS — M80031D Age-related osteoporosis with current pathological fracture, right forearm, subsequent encounter for fracture with routine healing: Secondary | ICD-10-CM | POA: Diagnosis not present

## 2020-09-24 DIAGNOSIS — S066X9D Traumatic subarachnoid hemorrhage with loss of consciousness of unspecified duration, subsequent encounter: Secondary | ICD-10-CM | POA: Diagnosis not present

## 2020-09-24 DIAGNOSIS — M5137 Other intervertebral disc degeneration, lumbosacral region: Secondary | ICD-10-CM | POA: Diagnosis not present

## 2020-09-24 DIAGNOSIS — I129 Hypertensive chronic kidney disease with stage 1 through stage 4 chronic kidney disease, or unspecified chronic kidney disease: Secondary | ICD-10-CM | POA: Diagnosis not present

## 2020-09-25 DIAGNOSIS — S066X9D Traumatic subarachnoid hemorrhage with loss of consciousness of unspecified duration, subsequent encounter: Secondary | ICD-10-CM | POA: Diagnosis not present

## 2020-09-25 DIAGNOSIS — M5137 Other intervertebral disc degeneration, lumbosacral region: Secondary | ICD-10-CM | POA: Diagnosis not present

## 2020-09-25 DIAGNOSIS — M353 Polymyalgia rheumatica: Secondary | ICD-10-CM | POA: Diagnosis not present

## 2020-09-25 DIAGNOSIS — N1831 Chronic kidney disease, stage 3a: Secondary | ICD-10-CM | POA: Diagnosis not present

## 2020-09-25 DIAGNOSIS — K219 Gastro-esophageal reflux disease without esophagitis: Secondary | ICD-10-CM | POA: Diagnosis not present

## 2020-09-25 DIAGNOSIS — M800AXD Age-related osteoporosis with current pathological fracture, other site, subsequent encounter for fracture with routine healing: Secondary | ICD-10-CM | POA: Diagnosis not present

## 2020-09-25 DIAGNOSIS — M80031D Age-related osteoporosis with current pathological fracture, right forearm, subsequent encounter for fracture with routine healing: Secondary | ICD-10-CM | POA: Diagnosis not present

## 2020-09-25 DIAGNOSIS — M48061 Spinal stenosis, lumbar region without neurogenic claudication: Secondary | ICD-10-CM | POA: Diagnosis not present

## 2020-09-25 DIAGNOSIS — I129 Hypertensive chronic kidney disease with stage 1 through stage 4 chronic kidney disease, or unspecified chronic kidney disease: Secondary | ICD-10-CM | POA: Diagnosis not present

## 2020-09-26 DIAGNOSIS — S066X9D Traumatic subarachnoid hemorrhage with loss of consciousness of unspecified duration, subsequent encounter: Secondary | ICD-10-CM | POA: Diagnosis not present

## 2020-09-26 DIAGNOSIS — M5137 Other intervertebral disc degeneration, lumbosacral region: Secondary | ICD-10-CM | POA: Diagnosis not present

## 2020-09-26 DIAGNOSIS — M353 Polymyalgia rheumatica: Secondary | ICD-10-CM | POA: Diagnosis not present

## 2020-09-26 DIAGNOSIS — M80031D Age-related osteoporosis with current pathological fracture, right forearm, subsequent encounter for fracture with routine healing: Secondary | ICD-10-CM | POA: Diagnosis not present

## 2020-09-26 DIAGNOSIS — M800AXD Age-related osteoporosis with current pathological fracture, other site, subsequent encounter for fracture with routine healing: Secondary | ICD-10-CM | POA: Diagnosis not present

## 2020-09-26 DIAGNOSIS — M48061 Spinal stenosis, lumbar region without neurogenic claudication: Secondary | ICD-10-CM | POA: Diagnosis not present

## 2020-09-26 DIAGNOSIS — K219 Gastro-esophageal reflux disease without esophagitis: Secondary | ICD-10-CM | POA: Diagnosis not present

## 2020-09-26 DIAGNOSIS — N1831 Chronic kidney disease, stage 3a: Secondary | ICD-10-CM | POA: Diagnosis not present

## 2020-09-26 DIAGNOSIS — I129 Hypertensive chronic kidney disease with stage 1 through stage 4 chronic kidney disease, or unspecified chronic kidney disease: Secondary | ICD-10-CM | POA: Diagnosis not present

## 2020-10-01 DIAGNOSIS — M800AXD Age-related osteoporosis with current pathological fracture, other site, subsequent encounter for fracture with routine healing: Secondary | ICD-10-CM | POA: Diagnosis not present

## 2020-10-01 DIAGNOSIS — M353 Polymyalgia rheumatica: Secondary | ICD-10-CM | POA: Diagnosis not present

## 2020-10-01 DIAGNOSIS — S066X9D Traumatic subarachnoid hemorrhage with loss of consciousness of unspecified duration, subsequent encounter: Secondary | ICD-10-CM | POA: Diagnosis not present

## 2020-10-01 DIAGNOSIS — M48061 Spinal stenosis, lumbar region without neurogenic claudication: Secondary | ICD-10-CM | POA: Diagnosis not present

## 2020-10-01 DIAGNOSIS — N1831 Chronic kidney disease, stage 3a: Secondary | ICD-10-CM | POA: Diagnosis not present

## 2020-10-01 DIAGNOSIS — K219 Gastro-esophageal reflux disease without esophagitis: Secondary | ICD-10-CM | POA: Diagnosis not present

## 2020-10-01 DIAGNOSIS — I129 Hypertensive chronic kidney disease with stage 1 through stage 4 chronic kidney disease, or unspecified chronic kidney disease: Secondary | ICD-10-CM | POA: Diagnosis not present

## 2020-10-01 DIAGNOSIS — M5137 Other intervertebral disc degeneration, lumbosacral region: Secondary | ICD-10-CM | POA: Diagnosis not present

## 2020-10-01 DIAGNOSIS — M80031D Age-related osteoporosis with current pathological fracture, right forearm, subsequent encounter for fracture with routine healing: Secondary | ICD-10-CM | POA: Diagnosis not present

## 2020-10-02 ENCOUNTER — Ambulatory Visit (INDEPENDENT_AMBULATORY_CARE_PROVIDER_SITE_OTHER): Payer: Medicare PPO | Admitting: Internal Medicine

## 2020-10-02 ENCOUNTER — Other Ambulatory Visit: Payer: Self-pay

## 2020-10-02 ENCOUNTER — Encounter: Payer: Self-pay | Admitting: Internal Medicine

## 2020-10-02 DIAGNOSIS — S066X0S Traumatic subarachnoid hemorrhage without loss of consciousness, sequela: Secondary | ICD-10-CM | POA: Diagnosis not present

## 2020-10-02 DIAGNOSIS — S62101D Fracture of unspecified carpal bone, right wrist, subsequent encounter for fracture with routine healing: Secondary | ICD-10-CM

## 2020-10-02 DIAGNOSIS — S0285XD Fracture of orbit, unspecified, subsequent encounter for fracture with routine healing: Secondary | ICD-10-CM

## 2020-10-02 DIAGNOSIS — S62101A Fracture of unspecified carpal bone, right wrist, initial encounter for closed fracture: Secondary | ICD-10-CM | POA: Insufficient documentation

## 2020-10-02 NOTE — Progress Notes (Signed)
Subjective:    Patient ID: Sydney [name], female    DOB: [birth year], 87 y.o.   MRN: [id number]  HPI Here with grandson for hospital/rehab follow up This visit occurred during the SARS-CoV-2 public health emergency.  Safety protocols were in place, including screening questions prior to the visit, additional usage of staff PPE, and extensive cleaning of exam room while observing appropriate contact time as indicated for disinfecting solutions.    Had gotten up at 6AM Turned walker and it got caught and she wiggled and fell into the dresser Hit face--daughter was there Rescue brought her to ER Right orbital fracture---and small SAH/ICH  Right wrist fracture also  Wound up with surgery for the orbit Went to inpatient rehab for ~10 days Now home for almost a month  No headaches  Vision change No facial change  Right wrist is better No longer in cast Getting both PT and OT  Current Outpatient Medications on File Prior to Visit  Medication Sig Dispense Refill   acetaminophen (TYLENOL) 500 MG tablet Take 1 tablet (500 mg total) by mouth every 6 (six) hours as needed for mild pain. 30 tablet 0   calcium carbonate (OS-CAL - DOSED IN MG OF ELEMENTAL CALCIUM) 1250 (500 Ca) MG tablet Take 1 tablet (500 mg of elemental calcium total) by mouth 2 (two) times daily with a meal. 60 tablet 0   diclofenac Sodium (VOLTAREN) 1 % GEL Apply 2 g topically 4 (four) times daily. 2 g 0   furosemide (LASIX) 20 MG tablet Take 2 tablets (40 mg total) by mouth daily as needed. For morning leg swelling (Patient taking differently: Take 40 mg by mouth every other day.) 1 tablet 0   nadolol (CORGARD) 20 MG tablet Take 1 tablet (20 mg total) by mouth daily. 30 tablet 0   pantoprazole (PROTONIX) 40 MG tablet Take 1 tablet (40 mg total) by mouth daily. 90 tablet 3   risedronate (ACTONEL) 150 MG tablet Take 150 mg by mouth every 30 (thirty) days. with water on empty stomach, nothing by mouth or lie down for next 30  minutes.     No current facility-administered medications on file prior to visit.    Allergies  Allergen Reactions   Lipitor [Atorvastatin]     Dizzy ( fuzziness)   Latex Rash    Past Medical History:  Diagnosis Date   GERD (gastroesophageal reflux disease)    Hx of lumbosacral spine surgery t   Hyperlipidemia    Hypertension    Iliac artery aneurysm (HCC)    Lumbar spinal stenosis    Polymyalgia rheumatica ([place]) 06/2016    Past Surgical History:  Procedure Laterality Date   JOINT REPLACEMENT  1992   right hip replacement   JOINT REPLACEMENT  2002   left hip replacement   ORIF ORBITAL FRACTURE Right 08/24/2020   Procedure: open reduction internal fixation of right orbital floor fracture;  Surgeon: Cindra Presume, MD;  Location: [facility];  Service: Plastics;  Laterality: Right;   ROTATOR CUFF REPAIR  2002   Left shoulder   SPINE SURGERY  2006 & 2010   posterior spinal fusion L4-5 L5S1    Family History  Problem Relation Age of Onset   Heart disease Mother    Heart disease Father        Heart Disease before age 65   Heart attack Father    Hyperlipidemia Father    Heart disease Sister    Diabetes Sister  Heart disease Brother    Hyperlipidemia Brother    Heart disease Brother    Cancer Brother     Social History   Socioeconomic History   Marital status: Widowed    Spouse name: Not on file   Number of children: Not on file   Years of education: Not on file   Highest education level: Not on file  Occupational History   Not on file  Tobacco Use   Smoking status: Never   Smokeless tobacco: Never  Substance and Sexual Activity   Alcohol use: No    Alcohol/week: 0.0 standard drinks   Drug use: No   Sexual activity: Not on file  Other Topics Concern   Not on file  Social History Narrative   Has living will   Daughter [name] is health care POA (then son [name])   Discussed DNR--she requests (done 11/30/12)   No tube feedings if cognitively unaware    Social Determinants of Health   Financial Resource Strain: Not on file  Food Insecurity: Not on file  Transportation Needs: Not on file  Physical Activity: Not on file  Stress: Not on file  Social Connections: Not on file  Intimate Partner Violence: Not on file   Current Outpatient Medications on File Prior to Visit  Medication Sig Dispense Refill   acetaminophen (TYLENOL) 500 MG tablet Take 1 tablet (500 mg total) by mouth every 6 (six) hours as needed for mild pain. 30 tablet 0   calcium carbonate (OS-CAL - DOSED IN MG OF ELEMENTAL CALCIUM) 1250 (500 Ca) MG tablet Take 1 tablet (500 mg of elemental calcium total) by mouth 2 (two) times daily with a meal. 60 tablet 0   diclofenac Sodium (VOLTAREN) 1 % GEL Apply 2 g topically 4 (four) times daily. 2 g 0   furosemide (LASIX) 20 MG tablet Take 2 tablets (40 mg total) by mouth daily as needed. For morning leg swelling (Patient taking differently: Take 40 mg by mouth every other day.) 1 tablet 0   nadolol (CORGARD) 20 MG tablet Take 1 tablet (20 mg total) by mouth daily. 30 tablet 0   pantoprazole (PROTONIX) 40 MG tablet Take 1 tablet (40 mg total) by mouth daily. 90 tablet 3   risedronate (ACTONEL) 150 MG tablet Take 150 mg by mouth every 30 (thirty) days. with water on empty stomach, nothing by mouth or lie down for next 30 minutes.     No current facility-administered medications on file prior to visit.    Allergies  Allergen Reactions   Lipitor [Atorvastatin]     Dizzy ( fuzziness)   Latex Rash    Past Medical History:  Diagnosis Date   GERD (gastroesophageal reflux disease)    Hx of lumbosacral spine surgery t   Hyperlipidemia    Hypertension    Iliac artery aneurysm (HCC)    Lumbar spinal stenosis    Polymyalgia rheumatica ([place]) 06/2016    Past Surgical History:  Procedure Laterality Date   JOINT REPLACEMENT  1992   right hip replacement   JOINT REPLACEMENT  2002   left hip replacement   ORIF ORBITAL FRACTURE  Right 08/24/2020   Procedure: open reduction internal fixation of right orbital floor fracture;  Surgeon: Cindra Presume, MD;  Location: [facility];  Service: Plastics;  Laterality: Right;   ROTATOR CUFF REPAIR  2002   Left shoulder   SPINE SURGERY  2006 & 2010   posterior spinal fusion L4-5 L5S1    Family History  Problem Relation Age of Onset   Heart disease Mother    Heart disease Father        Heart Disease before age 48   Heart attack Father    Hyperlipidemia Father    Heart disease Sister    Diabetes Sister    Heart disease Brother    Hyperlipidemia Brother    Heart disease Brother    Cancer Brother     Social History   Socioeconomic History   Marital status: Widowed    Spouse name: Not on file   Number of children: Not on file   Years of education: Not on file   Highest education level: Not on file  Occupational History   Not on file  Tobacco Use   Smoking status: Never   Smokeless tobacco: Never  Substance and Sexual Activity   Alcohol use: No    Alcohol/week: 0.0 standard drinks   Drug use: No   Sexual activity: Not on file  Other Topics Concern   Not on file  Social History Narrative   Has living will   Daughter [name] is health care POA (then son [name])   Discussed DNR--she requests (done 11/30/12)   No tube feedings if cognitively unaware   Social Determinants of Health   Financial Resource Strain: Not on file  Food Insecurity: Not on file  Transportation Needs: Not on file  Physical Activity: Not on file  Stress: Not on file  Social Connections: Not on file  Intimate Partner Violence: Not on file   Review of Systems Not driving Family doing shopping Granddaughter helps with cooking---freezes them and  can microwave Housekeeper every 2 weeks (now able to do her own laundry again) Appetite is good Sleeps well    Objective:   Physical Exam Constitutional:      Appearance: Normal appearance.  HENT:     Head:     Comments:  No facial  tenderness Eyes:     Extraocular Movements: Extraocular movements intact.  Cardiovascular:     Rate and Rhythm: Normal rate and regular rhythm.     Heart sounds: No murmur heard.   No gallop.  Pulmonary:     Effort: Pulmonary effort is normal.     Breath sounds: Normal breath sounds. No wheezing or rales.  Musculoskeletal:     Cervical back: Neck supple.     Right lower leg: No edema.     Left lower leg: No edema.  Lymphadenopathy:     Cervical: No cervical adenopathy.  Neurological:     Mental Status: She is alert.           Assessment & Plan:

## 2020-10-02 NOTE — Assessment & Plan Note (Signed)
Small and intervention not needed Orbital fracture repaired No neurologic issues now

## 2020-10-02 NOTE — Assessment & Plan Note (Signed)
Doing well since repair Continues PT and OT at home after inpatient rehab

## 2020-10-02 NOTE — Assessment & Plan Note (Signed)
Improved Continues to work with OT

## 2020-10-03 ENCOUNTER — Ambulatory Visit: Payer: Medicare PPO | Admitting: Plastic Surgery

## 2020-10-04 ENCOUNTER — Other Ambulatory Visit: Payer: Self-pay | Admitting: Internal Medicine

## 2020-10-05 DIAGNOSIS — M80031D Age-related osteoporosis with current pathological fracture, right forearm, subsequent encounter for fracture with routine healing: Secondary | ICD-10-CM | POA: Diagnosis not present

## 2020-10-05 DIAGNOSIS — I129 Hypertensive chronic kidney disease with stage 1 through stage 4 chronic kidney disease, or unspecified chronic kidney disease: Secondary | ICD-10-CM | POA: Diagnosis not present

## 2020-10-05 DIAGNOSIS — N1831 Chronic kidney disease, stage 3a: Secondary | ICD-10-CM | POA: Diagnosis not present

## 2020-10-05 DIAGNOSIS — S066X9D Traumatic subarachnoid hemorrhage with loss of consciousness of unspecified duration, subsequent encounter: Secondary | ICD-10-CM | POA: Diagnosis not present

## 2020-10-05 DIAGNOSIS — M48061 Spinal stenosis, lumbar region without neurogenic claudication: Secondary | ICD-10-CM | POA: Diagnosis not present

## 2020-10-05 DIAGNOSIS — M800AXD Age-related osteoporosis with current pathological fracture, other site, subsequent encounter for fracture with routine healing: Secondary | ICD-10-CM | POA: Diagnosis not present

## 2020-10-05 DIAGNOSIS — M5137 Other intervertebral disc degeneration, lumbosacral region: Secondary | ICD-10-CM | POA: Diagnosis not present

## 2020-10-05 DIAGNOSIS — M353 Polymyalgia rheumatica: Secondary | ICD-10-CM | POA: Diagnosis not present

## 2020-10-05 DIAGNOSIS — K219 Gastro-esophageal reflux disease without esophagitis: Secondary | ICD-10-CM | POA: Diagnosis not present

## 2020-10-08 DIAGNOSIS — M48061 Spinal stenosis, lumbar region without neurogenic claudication: Secondary | ICD-10-CM | POA: Diagnosis not present

## 2020-10-08 DIAGNOSIS — M800AXD Age-related osteoporosis with current pathological fracture, other site, subsequent encounter for fracture with routine healing: Secondary | ICD-10-CM | POA: Diagnosis not present

## 2020-10-08 DIAGNOSIS — M353 Polymyalgia rheumatica: Secondary | ICD-10-CM | POA: Diagnosis not present

## 2020-10-08 DIAGNOSIS — S066X9D Traumatic subarachnoid hemorrhage with loss of consciousness of unspecified duration, subsequent encounter: Secondary | ICD-10-CM | POA: Diagnosis not present

## 2020-10-08 DIAGNOSIS — M5137 Other intervertebral disc degeneration, lumbosacral region: Secondary | ICD-10-CM | POA: Diagnosis not present

## 2020-10-08 DIAGNOSIS — M80031D Age-related osteoporosis with current pathological fracture, right forearm, subsequent encounter for fracture with routine healing: Secondary | ICD-10-CM | POA: Diagnosis not present

## 2020-10-08 DIAGNOSIS — I129 Hypertensive chronic kidney disease with stage 1 through stage 4 chronic kidney disease, or unspecified chronic kidney disease: Secondary | ICD-10-CM | POA: Diagnosis not present

## 2020-10-08 DIAGNOSIS — K219 Gastro-esophageal reflux disease without esophagitis: Secondary | ICD-10-CM | POA: Diagnosis not present

## 2020-10-08 DIAGNOSIS — N1831 Chronic kidney disease, stage 3a: Secondary | ICD-10-CM | POA: Diagnosis not present

## 2020-10-09 DIAGNOSIS — K219 Gastro-esophageal reflux disease without esophagitis: Secondary | ICD-10-CM | POA: Diagnosis not present

## 2020-10-09 DIAGNOSIS — M353 Polymyalgia rheumatica: Secondary | ICD-10-CM | POA: Diagnosis not present

## 2020-10-09 DIAGNOSIS — S066X9D Traumatic subarachnoid hemorrhage with loss of consciousness of unspecified duration, subsequent encounter: Secondary | ICD-10-CM | POA: Diagnosis not present

## 2020-10-09 DIAGNOSIS — M5137 Other intervertebral disc degeneration, lumbosacral region: Secondary | ICD-10-CM | POA: Diagnosis not present

## 2020-10-09 DIAGNOSIS — M800AXD Age-related osteoporosis with current pathological fracture, other site, subsequent encounter for fracture with routine healing: Secondary | ICD-10-CM | POA: Diagnosis not present

## 2020-10-09 DIAGNOSIS — M48061 Spinal stenosis, lumbar region without neurogenic claudication: Secondary | ICD-10-CM | POA: Diagnosis not present

## 2020-10-09 DIAGNOSIS — I129 Hypertensive chronic kidney disease with stage 1 through stage 4 chronic kidney disease, or unspecified chronic kidney disease: Secondary | ICD-10-CM | POA: Diagnosis not present

## 2020-10-09 DIAGNOSIS — N1831 Chronic kidney disease, stage 3a: Secondary | ICD-10-CM | POA: Diagnosis not present

## 2020-10-09 DIAGNOSIS — M80031D Age-related osteoporosis with current pathological fracture, right forearm, subsequent encounter for fracture with routine healing: Secondary | ICD-10-CM | POA: Diagnosis not present

## 2020-10-10 DIAGNOSIS — N1831 Chronic kidney disease, stage 3a: Secondary | ICD-10-CM | POA: Diagnosis not present

## 2020-10-10 DIAGNOSIS — M5137 Other intervertebral disc degeneration, lumbosacral region: Secondary | ICD-10-CM | POA: Diagnosis not present

## 2020-10-10 DIAGNOSIS — I129 Hypertensive chronic kidney disease with stage 1 through stage 4 chronic kidney disease, or unspecified chronic kidney disease: Secondary | ICD-10-CM | POA: Diagnosis not present

## 2020-10-10 DIAGNOSIS — M48061 Spinal stenosis, lumbar region without neurogenic claudication: Secondary | ICD-10-CM | POA: Diagnosis not present

## 2020-10-10 DIAGNOSIS — K219 Gastro-esophageal reflux disease without esophagitis: Secondary | ICD-10-CM | POA: Diagnosis not present

## 2020-10-10 DIAGNOSIS — M80031D Age-related osteoporosis with current pathological fracture, right forearm, subsequent encounter for fracture with routine healing: Secondary | ICD-10-CM | POA: Diagnosis not present

## 2020-10-10 DIAGNOSIS — S066X9D Traumatic subarachnoid hemorrhage with loss of consciousness of unspecified duration, subsequent encounter: Secondary | ICD-10-CM | POA: Diagnosis not present

## 2020-10-10 DIAGNOSIS — M353 Polymyalgia rheumatica: Secondary | ICD-10-CM | POA: Diagnosis not present

## 2020-10-10 DIAGNOSIS — M800AXD Age-related osteoporosis with current pathological fracture, other site, subsequent encounter for fracture with routine healing: Secondary | ICD-10-CM | POA: Diagnosis not present

## 2020-10-17 DIAGNOSIS — M80031D Age-related osteoporosis with current pathological fracture, right forearm, subsequent encounter for fracture with routine healing: Secondary | ICD-10-CM | POA: Diagnosis not present

## 2020-10-17 DIAGNOSIS — M5137 Other intervertebral disc degeneration, lumbosacral region: Secondary | ICD-10-CM | POA: Diagnosis not present

## 2020-10-17 DIAGNOSIS — K219 Gastro-esophageal reflux disease without esophagitis: Secondary | ICD-10-CM | POA: Diagnosis not present

## 2020-10-17 DIAGNOSIS — M48061 Spinal stenosis, lumbar region without neurogenic claudication: Secondary | ICD-10-CM | POA: Diagnosis not present

## 2020-10-17 DIAGNOSIS — N1831 Chronic kidney disease, stage 3a: Secondary | ICD-10-CM | POA: Diagnosis not present

## 2020-10-17 DIAGNOSIS — I129 Hypertensive chronic kidney disease with stage 1 through stage 4 chronic kidney disease, or unspecified chronic kidney disease: Secondary | ICD-10-CM | POA: Diagnosis not present

## 2020-10-17 DIAGNOSIS — S066X9D Traumatic subarachnoid hemorrhage with loss of consciousness of unspecified duration, subsequent encounter: Secondary | ICD-10-CM | POA: Diagnosis not present

## 2020-10-17 DIAGNOSIS — M800AXD Age-related osteoporosis with current pathological fracture, other site, subsequent encounter for fracture with routine healing: Secondary | ICD-10-CM | POA: Diagnosis not present

## 2020-10-17 DIAGNOSIS — M353 Polymyalgia rheumatica: Secondary | ICD-10-CM | POA: Diagnosis not present

## 2020-10-23 DIAGNOSIS — M5137 Other intervertebral disc degeneration, lumbosacral region: Secondary | ICD-10-CM | POA: Diagnosis not present

## 2020-10-23 DIAGNOSIS — M800AXD Age-related osteoporosis with current pathological fracture, other site, subsequent encounter for fracture with routine healing: Secondary | ICD-10-CM | POA: Diagnosis not present

## 2020-10-23 DIAGNOSIS — K219 Gastro-esophageal reflux disease without esophagitis: Secondary | ICD-10-CM | POA: Diagnosis not present

## 2020-10-23 DIAGNOSIS — M353 Polymyalgia rheumatica: Secondary | ICD-10-CM | POA: Diagnosis not present

## 2020-10-23 DIAGNOSIS — S066X9D Traumatic subarachnoid hemorrhage with loss of consciousness of unspecified duration, subsequent encounter: Secondary | ICD-10-CM | POA: Diagnosis not present

## 2020-10-23 DIAGNOSIS — I129 Hypertensive chronic kidney disease with stage 1 through stage 4 chronic kidney disease, or unspecified chronic kidney disease: Secondary | ICD-10-CM | POA: Diagnosis not present

## 2020-10-23 DIAGNOSIS — M48061 Spinal stenosis, lumbar region without neurogenic claudication: Secondary | ICD-10-CM | POA: Diagnosis not present

## 2020-10-23 DIAGNOSIS — N1831 Chronic kidney disease, stage 3a: Secondary | ICD-10-CM | POA: Diagnosis not present

## 2020-10-23 DIAGNOSIS — M80031D Age-related osteoporosis with current pathological fracture, right forearm, subsequent encounter for fracture with routine healing: Secondary | ICD-10-CM | POA: Diagnosis not present

## 2020-10-25 DIAGNOSIS — M5137 Other intervertebral disc degeneration, lumbosacral region: Secondary | ICD-10-CM | POA: Diagnosis not present

## 2020-10-25 DIAGNOSIS — M80031D Age-related osteoporosis with current pathological fracture, right forearm, subsequent encounter for fracture with routine healing: Secondary | ICD-10-CM | POA: Diagnosis not present

## 2020-10-25 DIAGNOSIS — N1831 Chronic kidney disease, stage 3a: Secondary | ICD-10-CM | POA: Diagnosis not present

## 2020-10-25 DIAGNOSIS — I129 Hypertensive chronic kidney disease with stage 1 through stage 4 chronic kidney disease, or unspecified chronic kidney disease: Secondary | ICD-10-CM | POA: Diagnosis not present

## 2020-10-25 DIAGNOSIS — M353 Polymyalgia rheumatica: Secondary | ICD-10-CM | POA: Diagnosis not present

## 2020-10-25 DIAGNOSIS — M48061 Spinal stenosis, lumbar region without neurogenic claudication: Secondary | ICD-10-CM | POA: Diagnosis not present

## 2020-10-25 DIAGNOSIS — M800AXD Age-related osteoporosis with current pathological fracture, other site, subsequent encounter for fracture with routine healing: Secondary | ICD-10-CM | POA: Diagnosis not present

## 2020-10-25 DIAGNOSIS — S066X9D Traumatic subarachnoid hemorrhage with loss of consciousness of unspecified duration, subsequent encounter: Secondary | ICD-10-CM | POA: Diagnosis not present

## 2020-10-25 DIAGNOSIS — K219 Gastro-esophageal reflux disease without esophagitis: Secondary | ICD-10-CM | POA: Diagnosis not present

## 2020-10-31 DIAGNOSIS — N1831 Chronic kidney disease, stage 3a: Secondary | ICD-10-CM | POA: Diagnosis not present

## 2020-10-31 DIAGNOSIS — S066X9D Traumatic subarachnoid hemorrhage with loss of consciousness of unspecified duration, subsequent encounter: Secondary | ICD-10-CM | POA: Diagnosis not present

## 2020-10-31 DIAGNOSIS — K219 Gastro-esophageal reflux disease without esophagitis: Secondary | ICD-10-CM | POA: Diagnosis not present

## 2020-10-31 DIAGNOSIS — M48061 Spinal stenosis, lumbar region without neurogenic claudication: Secondary | ICD-10-CM | POA: Diagnosis not present

## 2020-10-31 DIAGNOSIS — M5137 Other intervertebral disc degeneration, lumbosacral region: Secondary | ICD-10-CM | POA: Diagnosis not present

## 2020-10-31 DIAGNOSIS — I129 Hypertensive chronic kidney disease with stage 1 through stage 4 chronic kidney disease, or unspecified chronic kidney disease: Secondary | ICD-10-CM | POA: Diagnosis not present

## 2020-10-31 DIAGNOSIS — M80031D Age-related osteoporosis with current pathological fracture, right forearm, subsequent encounter for fracture with routine healing: Secondary | ICD-10-CM | POA: Diagnosis not present

## 2020-10-31 DIAGNOSIS — M800AXD Age-related osteoporosis with current pathological fracture, other site, subsequent encounter for fracture with routine healing: Secondary | ICD-10-CM | POA: Diagnosis not present

## 2020-10-31 DIAGNOSIS — M353 Polymyalgia rheumatica: Secondary | ICD-10-CM | POA: Diagnosis not present

## 2020-12-31 ENCOUNTER — Other Ambulatory Visit: Payer: Self-pay | Admitting: Internal Medicine

## 2021-01-01 DIAGNOSIS — M353 Polymyalgia rheumatica: Secondary | ICD-10-CM | POA: Diagnosis not present

## 2021-01-01 DIAGNOSIS — R609 Edema, unspecified: Secondary | ICD-10-CM | POA: Diagnosis not present

## 2021-01-01 DIAGNOSIS — Z604 Social exclusion and rejection: Secondary | ICD-10-CM | POA: Diagnosis not present

## 2021-01-01 DIAGNOSIS — M1990 Unspecified osteoarthritis, unspecified site: Secondary | ICD-10-CM | POA: Diagnosis not present

## 2021-01-01 DIAGNOSIS — R269 Unspecified abnormalities of gait and mobility: Secondary | ICD-10-CM | POA: Diagnosis not present

## 2021-01-01 DIAGNOSIS — I739 Peripheral vascular disease, unspecified: Secondary | ICD-10-CM | POA: Diagnosis not present

## 2021-01-01 DIAGNOSIS — I10 Essential (primary) hypertension: Secondary | ICD-10-CM | POA: Diagnosis not present

## 2021-01-01 DIAGNOSIS — M810 Age-related osteoporosis without current pathological fracture: Secondary | ICD-10-CM | POA: Diagnosis not present

## 2021-01-01 DIAGNOSIS — M4800 Spinal stenosis, site unspecified: Secondary | ICD-10-CM | POA: Diagnosis not present

## 2021-01-08 ENCOUNTER — Other Ambulatory Visit: Payer: Self-pay | Admitting: Internal Medicine

## 2021-01-09 ENCOUNTER — Other Ambulatory Visit: Payer: Self-pay

## 2021-01-09 MED ORDER — NADOLOL 20 MG PO TABS: 20.0000 mg | ORAL_TABLET | Freq: Every day | ORAL | 3 refills | Status: DC

## 2021-01-09 MED ORDER — PANTOPRAZOLE SODIUM 40 MG PO TBEC: 40.0000 mg | DELAYED_RELEASE_TABLET | Freq: Every day | ORAL | 3 refills | Status: DC

## 2021-01-09 NOTE — Telephone Encounter (Signed)
Patient called stating that pharmacy advised her that Pantoprazole 40 mg was denied by Dr Silvio Pate and she wanted to know if she is not suppose to be taking this medication? Advised patient I do not see this in her chart in the computer but do see Nadolol was denied because of the wrong dose. I advised patient that it shows that it was refilled by another provider in May 2022 for a year but patient states that is not true.  Please let patient know if she needs to continue this medication and if so needs refills please.

## 2021-01-09 NOTE — Telephone Encounter (Signed)
Please let her know that I refilled both of them

## 2021-01-10 NOTE — Telephone Encounter (Signed)
Spoke to pt

## 2021-01-31 ENCOUNTER — Other Ambulatory Visit: Payer: Self-pay | Admitting: Internal Medicine

## 2021-04-17 ENCOUNTER — Ambulatory Visit (INDEPENDENT_AMBULATORY_CARE_PROVIDER_SITE_OTHER): Payer: Medicare PPO | Admitting: Internal Medicine

## 2021-04-17 ENCOUNTER — Other Ambulatory Visit: Payer: Self-pay

## 2021-04-17 ENCOUNTER — Encounter: Payer: Self-pay | Admitting: Internal Medicine

## 2021-04-17 VITALS — BP 110/68 | HR 60 | Temp 96.0°F | Ht 64.5 in | Wt 160.0 lb

## 2021-04-17 DIAGNOSIS — M353 Polymyalgia rheumatica: Secondary | ICD-10-CM

## 2021-04-17 DIAGNOSIS — R053 Chronic cough: Secondary | ICD-10-CM | POA: Insufficient documentation

## 2021-04-17 DIAGNOSIS — I10 Essential (primary) hypertension: Secondary | ICD-10-CM

## 2021-04-17 DIAGNOSIS — Z0000 Encounter for general adult medical examination without abnormal findings: Secondary | ICD-10-CM | POA: Diagnosis not present

## 2021-04-17 DIAGNOSIS — I723 Aneurysm of iliac artery: Secondary | ICD-10-CM

## 2021-04-17 DIAGNOSIS — Z7189 Other specified counseling: Secondary | ICD-10-CM

## 2021-04-17 DIAGNOSIS — K219 Gastro-esophageal reflux disease without esophagitis: Secondary | ICD-10-CM

## 2021-04-17 LAB — CBC
HCT: 41.4 % (ref 36.0–46.0)
Hemoglobin: 13.7 g/dL (ref 12.0–15.0)
MCHC: 33.1 g/dL (ref 30.0–36.0)
MCV: 93.3 fl (ref 78.0–100.0)
Platelets: 219 10*3/uL (ref 150.0–400.0)
RBC: 4.44 Mil/uL (ref 3.87–5.11)
RDW: 13.1 % (ref 11.5–15.5)
WBC: 7.3 10*3/uL (ref 4.0–10.5)

## 2021-04-17 LAB — COMPREHENSIVE METABOLIC PANEL
ALT: 7 U/L (ref 0–35)
AST: 13 U/L (ref 0–37)
Albumin: 3.8 g/dL (ref 3.5–5.2)
Alkaline Phosphatase: 42 U/L (ref 39–117)
BUN: 16 mg/dL (ref 6–23)
CO2: 26 mEq/L (ref 19–32)
Calcium: 9.9 mg/dL (ref 8.4–10.5)
Chloride: 106 mEq/L (ref 96–112)
Creatinine, Ser: 0.77 mg/dL (ref 0.40–1.20)
GFR: 69.72 mL/min (ref >60.00)
Glucose, Bld: 93 mg/dL (ref 70–99)
Potassium: 4.2 mEq/L (ref 3.5–5.1)
Sodium: 141 mEq/L (ref 135–145)
Total Bilirubin: 0.6 mg/dL (ref 0.2–1.2)
Total Protein: 6.6 g/dL (ref 6.0–8.3)

## 2021-04-17 LAB — SEDIMENTATION RATE: Sed Rate: 42 mm/hr — ABNORMAL HIGH (ref 0–30)

## 2021-04-17 MED ORDER — CEFUROXIME AXETIL 250 MG PO TABS: 250.0000 mg | ORAL_TABLET | Freq: Two times a day (BID) | ORAL | 0 refills | Status: DC

## 2021-04-17 NOTE — Assessment & Plan Note (Signed)
I have personally reviewed the Medicare Annual Wellness questionnaire and have noted 1. The patient's medical and social history 2. Their use of alcohol, tobacco or illicit drugs 3. Their current medications and supplements 4. The patient's functional ability including ADL's, fall risks, home safety risks and hearing or visual             impairment. 5. Diet and physical activities 6. Evidence for depression or mood disorders  The patients weight, height, BMI and visual acuity have been recorded in the chart I have made referrals, counseling and provided education to the patient based review of the above and I have provided the pt with a written personalized care plan for preventive services.  I have provided you with a copy of your personalized plan for preventive services. Please take the time to review along with your updated medication list.  Tries to exercise UTD with immunizations---consider shingrix when covered No cancer screening due to age

## 2021-04-17 NOTE — Assessment & Plan Note (Signed)
With purulent sputum Will try antibiotic due to lasting a month

## 2021-04-17 NOTE — Assessment & Plan Note (Signed)
Seems to be in remission still Will check ESR

## 2021-04-17 NOTE — Assessment & Plan Note (Signed)
On MRI--small No action No statin due to age

## 2021-04-17 NOTE — Assessment & Plan Note (Signed)
BP Readings from Last 3 Encounters:  04/17/21 110/68  10/02/20 116/70  09/05/20 138/69   Adequate control with nadolol and furosemide

## 2021-04-17 NOTE — Progress Notes (Signed)
Subjective:    Patient ID: Sydney [name], female    DOB: [birth year], 88 y.o.   MRN: [id number]  HPI Here for Medicare wellness visit and follow up of chronic health conditions With daughter from [place] (going down there to visit for 2 weeks) Reviewed advanced directives Reviewed other doctors----Dr [facility] Not driving since [facility] in May--only hospitalization (and had surgery on orbit fracture). 2 other falls without injury---seemed to be due to poorly working rollator Vision is okay--overdue for exam (needs new doctor) Hearing is fine Rare alcohol  No tobacco Mild depression but not anhedonia Has Nu-step---tries to do this regularly Does instrumental ADLs--though not much No sig memory problems  Has had a lingering cough--3-4 weeks Coughing up lots of purulent No sob No fever No sinus symptoms  Has adjusted to being widow--has a lot of friends and family visiting No persistent depression  Hasn't been checking BP much---- last was 110/68 No chest pain or SOB No dizziness or syncope No edema Occasional headache--tylenol helps  Uses voltaren on right shoulder--helps a lot No generalized symptoms of PMR though  Continues actonel monthly Calcium and vitamin D also  No heartburn on daily pantoprazole No dysphagia  Known iliac artery aneurysm Current Outpatient Medications on File Prior to Visit  Medication Sig Dispense Refill   acetaminophen (TYLENOL) 500 MG tablet Take 1 tablet (500 mg total) by mouth every 6 (six) hours as needed for mild pain. 30 tablet 0   Ascorbic Acid (VITAMIN C) 500 MG CHEW Chew 2 each by mouth in the morning.     diclofenac Sodium (VOLTAREN) 1 % GEL Apply 2 g topically 4 (four) times daily. 2 g 0   furosemide (LASIX) 20 MG tablet Take 2 tablets (40 mg total) by mouth daily as needed. For morning leg swelling (Patient taking differently: Take 40 mg by mouth every other day.) 1 tablet 0   nadolol (CORGARD) 20 MG tablet Take 1 tablet (20  mg total) by mouth daily. 90 tablet 3   OVER THE COUNTER MEDICATION Take 1 each by mouth 2 (two) times daily. Vitafusion gummy     pantoprazole (PROTONIX) 40 MG tablet Take 1 tablet (40 mg total) by mouth daily. 90 tablet 3   risedronate (ACTONEL) 150 MG tablet TAKE 1 TAB BY MOUTH EVERY 30 DAYS WITH WATER ON EMPTY STOMACH, DONT LIE DOWN FOR NEXT 30 MINUTES. 3 tablet 3   No current facility-administered medications on file prior to visit.    Allergies  Allergen Reactions   Lipitor [Atorvastatin]     Dizzy ( fuzziness)   Latex Rash    Past Medical History:  Diagnosis Date   GERD (gastroesophageal reflux disease)    Hx of lumbosacral spine surgery t   Hyperlipidemia    Hypertension    Iliac artery aneurysm (HCC)    Lumbar spinal stenosis    Polymyalgia rheumatica ([place]) 06/2016    Past Surgical History:  Procedure Laterality Date   JOINT REPLACEMENT  1992   right hip replacement   JOINT REPLACEMENT  2002   left hip replacement   ORIF ORBITAL FRACTURE Right 08/24/2020   Procedure: open reduction internal fixation of right orbital floor fracture;  Surgeon: Cindra Presume, MD;  Location: [facility];  Service: Plastics;  Laterality: Right;   ROTATOR CUFF REPAIR  2002   Left shoulder   SPINE SURGERY  2006 & 2010   posterior spinal fusion L4-5 L5S1    Family History  Problem Relation Age of Onset  Heart disease Mother    Heart disease Father        Heart Disease before age 59   Heart attack Father    Hyperlipidemia Father    Heart disease Sister    Diabetes Sister    Heart disease Brother    Hyperlipidemia Brother    Heart disease Brother    Cancer Brother     Social History   Socioeconomic History   Marital status: Widowed    Spouse name: Not on file   Number of children: Not on file   Years of education: Not on file   Highest education level: Not on file  Occupational History   Not on file  Tobacco Use   Smoking status: Never   Smokeless tobacco: Never   Substance and Sexual Activity   Alcohol use: No    Alcohol/week: 0.0 standard drinks   Drug use: No   Sexual activity: Not on file  Other Topics Concern   Not on file  Social History Narrative   Has living will   Daughter [name] is health care POA (then son [name])   Discussed DNR--she requests (done 11/30/12)   No tube feedings if cognitively unaware   Social Determinants of Health   Financial Resource Strain: Not on file  Food Insecurity: Not on file  Transportation Needs: Not on file  Physical Activity: Not on file  Stress: Not on file  Social Connections: Not on file  Intimate Partner Violence: Not on file   Review of Systems Appetite is down--but eats okay Weight stable Usually sleeps okay--unless something on her mind Wears seat belt Teeth are fine--keeps up with dentist No suspicious skin lesions Bowels move fine--no blood No hematuria or dysuria Right wrist still hurts a bit--mostly better (from fracture)    Objective:   Physical Exam Constitutional:      Appearance: Normal appearance.  HENT:     Mouth/Throat:     Pharynx: No oropharyngeal exudate or posterior oropharyngeal erythema.  Eyes:     Conjunctiva/sclera: Conjunctivae normal.     Pupils: Pupils are equal, round, and reactive to light.  Cardiovascular:     Rate and Rhythm: Normal rate and regular rhythm.     Pulses: Normal pulses.     Heart sounds: No murmur heard.   No gallop.  Pulmonary:     Effort: Pulmonary effort is normal.     Breath sounds: Normal breath sounds. No wheezing or rales.  Abdominal:     Palpations: Abdomen is soft.     Tenderness: There is no abdominal tenderness.  Musculoskeletal:     Cervical back: Neck supple.     Right lower leg: No edema.     Left lower leg: No edema.  Lymphadenopathy:     Cervical: No cervical adenopathy.  Skin:    Findings: No lesion or rash.  Neurological:     Mental Status: She is alert and oriented to person, place, and time.  Psychiatric:         Mood and Affect: Mood normal.        Behavior: Behavior normal.           Assessment & Plan:

## 2021-04-17 NOTE — Assessment & Plan Note (Signed)
Quiet with daily pantoprazole

## 2021-04-17 NOTE — Assessment & Plan Note (Signed)
Has DNR 

## 2021-04-19 ENCOUNTER — Telehealth: Payer: Self-pay

## 2021-04-19 NOTE — Telephone Encounter (Signed)
Left message to call office about labs: The labs are all normal except for mild elevation of the sed rate (inflammation marker). If she has more shoulder aching--or other muscle type pain, we should consider restarting the low dose prednisone. Everything else was normal

## 2021-04-22 ENCOUNTER — Telehealth: Payer: Self-pay

## 2021-04-22 NOTE — Telephone Encounter (Signed)
Left message for pt about labs.  Per Dr Silvio Pate: The labs are all normal except for mild elevation of the sed rate (inflammation marker). If she has more shoulder aching--or other muscle type pain, we should consider restarting the low dose prednisone. Everything else was normal Send copy if pt wants.

## 2021-05-09 ENCOUNTER — Telehealth: Payer: Self-pay | Admitting: Internal Medicine

## 2021-05-09 NOTE — Telephone Encounter (Signed)
Spoke to pt. She will let us know if she has the issues listed.

## 2021-05-09 NOTE — Telephone Encounter (Signed)
Pt called asking for a call back to discuss some papers she got in the mail for medication. Please advise.

## 2021-05-09 NOTE — Telephone Encounter (Signed)
Pt has been in [place] with her daughter and just got her lab results. She said the pain has gotten better and is not sure if she needs the prednisone now. Will see what Dr Silvio Pate says.

## 2021-07-24 ENCOUNTER — Ambulatory Visit (INDEPENDENT_AMBULATORY_CARE_PROVIDER_SITE_OTHER): Payer: Medicare PPO | Admitting: Internal Medicine

## 2021-07-24 ENCOUNTER — Encounter: Payer: Self-pay | Admitting: Internal Medicine

## 2021-07-24 DIAGNOSIS — I872 Venous insufficiency (chronic) (peripheral): Secondary | ICD-10-CM | POA: Diagnosis not present

## 2021-07-24 DIAGNOSIS — K219 Gastro-esophageal reflux disease without esophagitis: Secondary | ICD-10-CM

## 2021-07-24 DIAGNOSIS — I10 Essential (primary) hypertension: Secondary | ICD-10-CM | POA: Diagnosis not present

## 2021-07-24 DIAGNOSIS — E441 Mild protein-calorie malnutrition: Secondary | ICD-10-CM | POA: Insufficient documentation

## 2021-07-24 DIAGNOSIS — M353 Polymyalgia rheumatica: Secondary | ICD-10-CM | POA: Diagnosis not present

## 2021-07-24 MED ORDER — PANTOPRAZOLE SODIUM 40 MG PO TBEC: 40.0000 mg | DELAYED_RELEASE_TABLET | ORAL | 0 refills | Status: DC

## 2021-07-24 NOTE — Assessment & Plan Note (Signed)
Quiet now ?Okay to continue pantoprazole 40 every other day ?

## 2021-07-24 NOTE — Patient Instructions (Signed)
Please stop the nadolol and furosemide. ?Let me know if you start getting leg swelling again (or the shoulder/hip muscle aching) ? ?Make sure you eat well and add 1-2 boost/ensure daily. ?

## 2021-07-24 NOTE — Assessment & Plan Note (Signed)
She no longer has edema--especially with her weight loss ?Will stop the furosemide ?

## 2021-07-24 NOTE — Progress Notes (Signed)
? ?Subjective:  ? ? Patient ID: Sydney [name], female    DOB: [birth year], 88 y.o.   MRN: [id number] ? ?HPI ?Here for follow up of medical issues ? ?She did have a dizzy spell a few days ago ?Thinks she got up out of chair too fast ? ?Gets fuzzy feeling in her head at times ?Goes back years ?No headaches ?No vision changes ? ?Does have pain in both shoulders---uses voltaren as needed ?This helps ?No aching like the PMR ?Hip replacements--but no sig pain ? ?Not cooking like she used to ?Hard getting used to being alone ?Appetite is not that great ? ?No heartburn ?No dysphagia ?Taking the pantoprazole every other day ? ?No edema ?Has been taking the furosemide every day ? ?Current Outpatient Medications on File Prior to Visit  ?Medication Sig Dispense Refill  ? acetaminophen (TYLENOL) 500 MG tablet Take 1 tablet (500 mg total) by mouth every 6 (six) hours as needed for mild pain. 30 tablet 0  ? Ascorbic Acid (VITAMIN C) 500 MG CHEW Chew 2 each by mouth in the morning.    ? furosemide (LASIX) 20 MG tablet Take 2 tablets (40 mg total) by mouth daily as needed. For morning leg swelling (Patient taking differently: Take 40 mg by mouth every other day.) 1 tablet 0  ? nadolol (CORGARD) 20 MG tablet Take 1 tablet (20 mg total) by mouth daily. 90 tablet 3  ? OVER THE COUNTER MEDICATION Take 1 each by mouth 2 (two) times daily. Vitafusion gummy    ? pantoprazole (PROTONIX) 40 MG tablet Take 1 tablet (40 mg total) by mouth daily. 90 tablet 3  ? ?No current facility-administered medications on file prior to visit.  ? ? ?Allergies  ?Allergen Reactions  ? Lipitor [Atorvastatin]   ?  Dizzy ( fuzziness)  ? Latex Rash  ? ? ?Past Medical History:  ?Diagnosis Date  ? GERD (gastroesophageal reflux disease)   ? Hx of lumbosacral spine surgery t  ? Hyperlipidemia   ? Hypertension   ? Iliac artery aneurysm ([place])   ? Lumbar spinal stenosis   ? Polymyalgia rheumatica ([place]) 06/2016  ? ? ?Past Surgical History:  ?Procedure Laterality Date  ?  JOINT REPLACEMENT  1992  ? right hip replacement  ? JOINT REPLACEMENT  2002  ? left hip replacement  ? ORIF ORBITAL FRACTURE Right 08/24/2020  ? Procedure: open reduction internal fixation of right orbital floor fracture;  Surgeon: Cindra Presume, MD;  Location: [facility];  Service: Plastics;  Laterality: Right;  ? [place]  2002  ? Left shoulder  ? [place] SURGERY  2006 & 2010  ? posterior spinal fusion L4-5 L5S1  ? ? ?Family History  ?Problem Relation Age of Onset  ? Heart disease Mother   ? Heart disease Father   ?     Heart Disease before age 18  ? Heart attack Father   ? Hyperlipidemia Father   ? Heart disease Sister   ? Diabetes Sister   ? Heart disease Brother   ? Hyperlipidemia Brother   ? Heart disease Brother   ? Cancer Brother   ? ? ?Social History  ? ?Socioeconomic History  ? Marital status: Widowed  ?  Spouse name: Not on file  ? Number of children: Not on file  ? Years of education: Not on file  ? Highest education level: Not on file  ?Occupational History  ? Not on file  ?Tobacco Use  ? Smoking status: Never  ?  Smokeless tobacco: Never  ?Substance and Sexual Activity  ? Alcohol use: No  ?  Alcohol/week: 0.0 standard drinks  ? Drug use: No  ? Sexual activity: Not on file  ?Other Topics Concern  ? Not on file  ?Social History Narrative  ? Has living will  ? Daughter [name] is health care POA (then son [name])  ? Discussed DNR--she requests (done 11/30/12)  ? No tube feedings if cognitively unaware  ? ?Social Determinants of Health  ? ?Financial Resource Strain: Not on file  ?Food Insecurity: Not on file  ?Transportation Needs: Not on file  ?Physical Activity: Not on file  ?Stress: Not on file  ?Social Connections: Not on file  ?Intimate Partner Violence: Not on file  ? ?Review of Systems ?Not sleeping well--things on her mind ?Does usually get 6-7 hours ?Stopped the risendronate----she doesn't really know why ? ?   ?Objective:  ? Physical Exam ?Constitutional:   ?   Appearance: Normal appearance.   ?Cardiovascular:  ?   Rate and Rhythm: Normal rate and regular rhythm.  ?   Heart sounds: No murmur heard. ?  No gallop.  ?Pulmonary:  ?   Effort: Pulmonary effort is normal.  ?   Breath sounds: Normal breath sounds. No wheezing or rales.  ?Musculoskeletal:  ?   Cervical back: Neck supple.  ?   Right lower leg: No edema.  ?   Left lower leg: No edema.  ?Lymphadenopathy:  ?   Cervical: No cervical adenopathy.  ?Neurological:  ?   General: No focal deficit present.  ?   Mental Status: She is alert and oriented to person, place, and time.  ?Psychiatric:     ?   Mood and Affect: Mood normal.     ?   Behavior: Behavior normal.  ?  ? ? ? ? ?   ?Assessment & Plan:  ? ?

## 2021-07-24 NOTE — Assessment & Plan Note (Signed)
Sed rate mildly elevated but she doesn't have the myalgia symptoms ?Will hold off on prednisone ?

## 2021-07-24 NOTE — Assessment & Plan Note (Signed)
I voiced my concerns about her weight loss ?Urged her to cook regular meals again (and freeze some) ?Needs adequate protein ?Discussed resistance exercise ?Try boost/ensure 1-2 daily ?

## 2021-07-24 NOTE — Assessment & Plan Note (Signed)
BP Readings from Last 3 Encounters:  ?07/24/21 124/70  ?04/17/21 110/68  ?10/02/20 116/70  ? ?Had recent orthostatic spell ?BP controlled ?Will try off the nadolol ?

## 2021-11-13 ENCOUNTER — Telehealth: Payer: Self-pay | Admitting: Internal Medicine

## 2021-11-13 NOTE — Telephone Encounter (Signed)
Patient called in and asked for advice on a good optometrist for her eyes.

## 2021-11-13 NOTE — Telephone Encounter (Signed)
Called pt back. She thought she had a conflict with her appt here next week, but realized it was okay.

## 2021-11-13 NOTE — Telephone Encounter (Signed)
Patient called and asked if Sydney [name] can call her back

## 2021-11-13 NOTE — Telephone Encounter (Signed)
Spoke to pt. Gave her the info for this Dr. She said she has been having some issues with feeling lightheaded when she wakes up. Said it clears up as the morning goes along. I made her an appt with Dr Silvio Pate on 11-19-21. She will call and let us know if she thinks she needs to be seen sooner.

## 2021-11-19 ENCOUNTER — Ambulatory Visit (INDEPENDENT_AMBULATORY_CARE_PROVIDER_SITE_OTHER): Payer: Medicare PPO | Admitting: Internal Medicine

## 2021-11-19 ENCOUNTER — Encounter: Payer: Self-pay | Admitting: Internal Medicine

## 2021-11-19 DIAGNOSIS — Z8249 Family history of ischemic heart disease and other diseases of the circulatory system: Secondary | ICD-10-CM | POA: Diagnosis not present

## 2021-11-19 DIAGNOSIS — Z7409 Other reduced mobility: Secondary | ICD-10-CM | POA: Diagnosis not present

## 2021-11-19 DIAGNOSIS — Z96649 Presence of unspecified artificial hip joint: Secondary | ICD-10-CM | POA: Diagnosis not present

## 2021-11-19 DIAGNOSIS — Z833 Family history of diabetes mellitus: Secondary | ICD-10-CM | POA: Diagnosis not present

## 2021-11-19 DIAGNOSIS — Z91040 Latex allergy status: Secondary | ICD-10-CM | POA: Diagnosis not present

## 2021-11-19 DIAGNOSIS — I10 Essential (primary) hypertension: Secondary | ICD-10-CM

## 2021-11-19 DIAGNOSIS — R030 Elevated blood-pressure reading, without diagnosis of hypertension: Secondary | ICD-10-CM | POA: Diagnosis not present

## 2021-11-19 DIAGNOSIS — M1990 Unspecified osteoarthritis, unspecified site: Secondary | ICD-10-CM | POA: Diagnosis not present

## 2021-11-19 NOTE — Assessment & Plan Note (Signed)
BP Readings from Last 3 Encounters:  11/19/21 128/86  07/24/21 124/70  04/17/21 110/68   Reassured her BP is okay My main concern would be it is too low----but no clear ongoing orthostatic symptoms Discussed my concern about hydration---okay to use some salt (like on her tomatoes)  She should be careful to avoid causing edema again (legs are thin now) Would consider fludrocortisone if worsening symptoms---not needed now

## 2021-11-19 NOTE — Progress Notes (Signed)
Subjective:    Patient ID: Sydney [name], female    DOB: [birth year], 88 y.o.   MRN: [id number]  HPI Here due to AM symptoms--worried about BP  When she first gets out of bed---feels a little shaky Has to be careful for about an hour--not quite stable (uses cane/walker) Feels better after her coffee Not dizzy like before we stopped the meds No orthostatic dizziness  No chest pain or SOB  Current Outpatient Medications on File Prior to Visit  Medication Sig Dispense Refill   acetaminophen (TYLENOL) 500 MG tablet Take 1 tablet (500 mg total) by mouth every 6 (six) hours as needed for mild pain. 30 tablet 0   Ascorbic Acid (VITAMIN C) 500 MG CHEW Chew 2 each by mouth in the morning.     OVER THE COUNTER MEDICATION Take 1 each by mouth 2 (two) times daily. Vitafusion gummy     pantoprazole (PROTONIX) 40 MG tablet Take 1 tablet (40 mg total) by mouth every other day. (Patient not taking: Reported on 11/19/2021) 1 tablet 0   No current facility-administered medications on file prior to visit.    Allergies  Allergen Reactions   Lipitor [Atorvastatin]     Dizzy ( fuzziness)   Latex Rash    Past Medical History:  Diagnosis Date   GERD (gastroesophageal reflux disease)    Hx of lumbosacral spine surgery t   Hyperlipidemia    Hypertension    Iliac artery aneurysm ([facility])    Lumbar spinal stenosis    Polymyalgia rheumatica ([place]) 06/2016    Past Surgical History:  Procedure Laterality Date   JOINT REPLACEMENT  1992   right hip replacement   JOINT REPLACEMENT  2002   left hip replacement   ORIF ORBITAL FRACTURE Right 08/24/2020   Procedure: open reduction internal fixation of right orbital floor fracture;  Surgeon: Cindra Presume, MD;  Location: [facility];  Service: Plastics;  Laterality: Right;   [place] CUFF REPAIR  2002   Left shoulder   SPINE SURGERY  2006 & 2010   posterior spinal fusion L4-5 L5S1    Family History  Problem Relation Age of Onset   Heart disease Mother     Heart disease Father        Heart Disease before age 67   Heart attack Father    Hyperlipidemia Father    Heart disease Sister    Diabetes Sister    Heart disease Brother    Hyperlipidemia Brother    Heart disease Brother    Cancer Brother     Social History   Socioeconomic History   Marital status: Widowed    Spouse name: Not on file   Number of children: Not on file   Years of education: Not on file   Highest education level: Not on file  Occupational History   Not on file  Tobacco Use   Smoking status: Never    Passive exposure: Past   Smokeless tobacco: Never  Substance and Sexual Activity   Alcohol use: No    Alcohol/week: 0.0 standard drinks of alcohol   Drug use: No   Sexual activity: Not on file  Other Topics Concern   Not on file  Social History Narrative   Has living will   Daughter [name] is health care POA (then son [name])   Discussed DNR--she requests (done 11/30/12)   No tube feedings if cognitively unaware   Social Determinants of Health   Financial Resource Strain: Not on file  Food Insecurity: Not on file  Transportation Needs: Not on file  Physical Activity: Not on file  Stress: Not on file  Social Connections: Not on file  Intimate Partner Violence: Not on file   Review of Systems Has brown spot on left temple to be checked Some shoulder aching---considers it arthritic (topical diclofenac helps) Appetite still not great---weight has stabilized Does go outside---but not doing yard work    Objective:   Physical Exam Constitutional:      Appearance: Normal appearance.  Cardiovascular:     Rate and Rhythm: Normal rate and regular rhythm.     Heart sounds: No murmur heard.    No gallop.  Pulmonary:     Effort: Pulmonary effort is normal.     Breath sounds: Normal breath sounds. No wheezing or rales.  Musculoskeletal:     Cervical back: Neck supple.     Right lower leg: No edema.     Left lower leg: No edema.  Lymphadenopathy:      Cervical: No cervical adenopathy.  Skin:    Comments: Benign keratosis left temple Small stable papule on forehead---will just monitor  Neurological:     Mental Status: She is alert.            Assessment & Plan:

## 2021-11-28 DIAGNOSIS — H52223 Regular astigmatism, bilateral: Secondary | ICD-10-CM | POA: Diagnosis not present

## 2021-11-28 DIAGNOSIS — Z9842 Cataract extraction status, left eye: Secondary | ICD-10-CM | POA: Diagnosis not present

## 2021-11-28 DIAGNOSIS — H52213 Irregular astigmatism, bilateral: Secondary | ICD-10-CM | POA: Diagnosis not present

## 2021-11-28 DIAGNOSIS — Z9841 Cataract extraction status, right eye: Secondary | ICD-10-CM | POA: Diagnosis not present

## 2021-12-30 ENCOUNTER — Other Ambulatory Visit: Payer: Self-pay | Admitting: Internal Medicine

## 2022-01-02 ENCOUNTER — Telehealth: Payer: Self-pay | Admitting: Internal Medicine

## 2022-01-02 NOTE — Telephone Encounter (Signed)
Form placed in Dr Letvak's inbox on his desk 

## 2022-01-02 NOTE — Telephone Encounter (Signed)
Pt's son, [name], dropped off a Handicap Parking Placard form for Dr. Silvio Pate to fill out. Pt's son requested a callback @ [phone] once forms are completed. Forms will pcp's folder.

## 2022-01-02 NOTE — Telephone Encounter (Signed)
Spoke to pt as the number in the message was to her and not to [facility]. Form up front ready for pickup. No charge for form.

## 2022-01-31 ENCOUNTER — Ambulatory Visit (INDEPENDENT_AMBULATORY_CARE_PROVIDER_SITE_OTHER): Payer: Medicare PPO

## 2022-01-31 DIAGNOSIS — Z23 Encounter for immunization: Secondary | ICD-10-CM | POA: Diagnosis not present

## 2022-03-06 ENCOUNTER — Emergency Department (HOSPITAL_COMMUNITY): Payer: Medicare PPO

## 2022-03-06 ENCOUNTER — Emergency Department (HOSPITAL_COMMUNITY)
Admission: EM | Admit: 2022-03-06 | Discharge: 2022-03-06 | Disposition: A | Discharge status: Home / Self Care | Payer: Medicare PPO | Attending: Emergency Medicine | Admitting: Emergency Medicine

## 2022-03-06 DIAGNOSIS — Z96643 Presence of artificial hip joint, bilateral: Secondary | ICD-10-CM | POA: Insufficient documentation

## 2022-03-06 DIAGNOSIS — Z91040 Latex allergy status: Secondary | ICD-10-CM | POA: Diagnosis not present

## 2022-03-06 DIAGNOSIS — R000 Tachycardia, unspecified: Secondary | ICD-10-CM | POA: Diagnosis not present

## 2022-03-06 DIAGNOSIS — Y828 Other medical devices associated with adverse incidents: Secondary | ICD-10-CM | POA: Diagnosis not present

## 2022-03-06 DIAGNOSIS — T84021A Dislocation of internal left hip prosthesis, initial encounter: Secondary | ICD-10-CM | POA: Diagnosis not present

## 2022-03-06 DIAGNOSIS — S73005A Unspecified dislocation of left hip, initial encounter: Secondary | ICD-10-CM

## 2022-03-06 DIAGNOSIS — R0689 Other abnormalities of breathing: Secondary | ICD-10-CM | POA: Diagnosis not present

## 2022-03-06 DIAGNOSIS — M25552 Pain in left hip: Secondary | ICD-10-CM | POA: Diagnosis present

## 2022-03-06 MED ORDER — PROPOFOL 10 MG/ML IV BOLUS
INTRAVENOUS | Status: AC | PRN
  Administered 2022-03-06: 60 mg via INTRAVENOUS
  Administered 2022-03-06: 30 mg via INTRAVENOUS

## 2022-03-06 MED ORDER — PROPOFOL 10 MG/ML IV BOLUS
60.0000 mg | Freq: Once | INTRAVENOUS | Status: DC
  Filled 2022-03-06: qty 20

## 2022-03-06 MED ORDER — FENTANYL CITRATE PF 50 MCG/ML IJ SOSY
50.0000 ug | PREFILLED_SYRINGE | Freq: Once | INTRAMUSCULAR | Status: AC
  Administered 2022-03-06: 50 ug via INTRAVENOUS
  Filled 2022-03-06: qty 1

## 2022-03-06 NOTE — Sedation Documentation (Signed)
X-ray at bedside

## 2022-03-06 NOTE — ED Notes (Signed)
Pt tells this RN to call her grandson Sydney [name] identified in chart for discharge ride home; this RN called 2x with no answer.

## 2022-03-06 NOTE — Progress Notes (Signed)
Orthopedic Tech Progress Note Patient Details:  Sydney [name] 1932-08-27 [id number]  Knee immobilizer applied to LLE following successful closed reduction of L hip by Dr. Campbell Lerner Devices Type of Ortho Device: Knee Immobilizer Ortho Device/Splint Location: LLE Ortho Device/Splint Interventions: Ordered, Application, Adjustment   Post Interventions Patient Tolerated: Well Instructions Provided: Care of device, Adjustment of device  [name] [name] 03/06/2022, 4:59 PM

## 2022-03-06 NOTE — Sedation Documentation (Signed)
Knee immbolizer placed by OT

## 2022-03-06 NOTE — ED Provider Notes (Signed)
[facility] EMERGENCY DEPARTMENT Provider Note   CSN: [id number] Arrival date & time: 03/06/22  2440     History  Chief Complaint  Patient presents with   Hip Injury    Sydney [name] is a 88 y.o. female.  HPI The patient is a 88 year old female with past medical history of bilateral hip replacements with recurrent dislocations presenting for evaluation of left hip pain.  The patient states that she was bending over to put on her shoes while seated on the bed earlier today and felt her left hip dislocate.  She attempted to reduce it but was unable to do so requiring her to call EMS.  She is currently endorsing pain localized to the left hip without associated numbness.  She denies recent falls, trauma, or additional symptoms at this time.    Home Medications Prior to Admission medications   Medication Sig Start Date End Date Taking? Authorizing Provider  acetaminophen (TYLENOL) 500 MG tablet Take 1 tablet (500 mg total) by mouth every 6 (six) hours as needed for mild pain. 09/04/20   Angiulli, Lavon Paganini, PA-C  Ascorbic Acid (VITAMIN C) 500 MG CHEW Chew 2 each by mouth in the morning.    [provider]  OVER THE COUNTER MEDICATION Take 1 each by mouth 2 (two) times daily. Vitafusion gummy    [provider]  pantoprazole (PROTONIX) 40 MG tablet TAKE 1 TABLET BY MOUTH EVERY DAY 12/30/21   Venia Carbon, MD      Allergies    Lipitor [atorvastatin] and Latex    Review of Systems   Review of Systems  See HPI  Physical Exam Updated Vital Signs BP (!) 142/87 (BP Location: Left Arm)   Pulse 65   Temp 98.5 F (36.9 C) (Oral)   Resp 18   SpO2 98%  Physical Exam Vitals and nursing note reviewed.  Constitutional:      General: She is not in acute distress.    Appearance: She is well-developed.  HENT:     Head: Normocephalic and atraumatic.  Eyes:     Conjunctiva/sclera: Conjunctivae normal.  Cardiovascular:     Rate and Rhythm: Normal  rate and regular rhythm.     Heart sounds: No murmur heard. Pulmonary:     Effort: Pulmonary effort is normal. No respiratory distress.     Breath sounds: Normal breath sounds.  Abdominal:     Palpations: Abdomen is soft.     Tenderness: There is no abdominal tenderness.  Musculoskeletal:        General: No swelling.     Cervical back: Neck supple.     Comments: Shortening without rotation of the left lower extremity.  Intact pulses and sensation in the distal left lower extremity.    Skin:    General: Skin is warm and dry.     Capillary Refill: Capillary refill takes less than 2 seconds.  Neurological:     General: No focal deficit present.     Mental Status: She is alert and oriented to person, place, and time.     Sensory: No sensory deficit.  Psychiatric:        Mood and Affect: Mood normal.     ED Results / Procedures / Treatments   Labs (all labs ordered are listed, but only abnormal results are displayed) Labs Reviewed - No data to display  EKG None  Radiology DG Hip Unilat W or Wo Pelvis 2-3 Views Left  Result Date: 03/06/2022 CLINICAL  DATA:  Left hip dislocation. EXAM: DG HIP (WITH OR WITHOUT PELVIS) 2-3V LEFT COMPARISON:  Earlier today. FINDINGS: There is been interval relocation of the left hip arthroplasty device. Unchanged appearance of the right hip arthroplasty device which appears located. No signs of acute fracture or dislocation. Chronic appearing inferior pubic rami fracture identified on the left. Postoperative changes noted within the lumbar spine. IMPRESSION: 1. Interval relocation of left hip arthroplasty device. 2. No acute findings. Electronically Signed   By: Kerby Moors M.D.   On: 03/06/2022 12:10   DG Hip Unilat W or Wo Pelvis 2-3 Views Left  Result Date: 03/06/2022 CLINICAL DATA:  Left hip dislocation. Patient states she bent over to tie her shoes and her hip dislocated. No fall. EXAM: DG HIP (WITH OR WITHOUT PELVIS) 2-3V LEFT COMPARISON:  None  Available. FINDINGS: There is posterosuperior dislocation of the left hip. Bilateral hip arthroplasty. Partially imaged posterior lumbar spinal fusion. Diffuse osteopenia. Chronic fracture of the left inferior pubic ramus. No acute fracture. IMPRESSION: Left hip arthroplasty dislocation without evidence of acute fracture. Electronically Signed   By: Keane Police D.O.   On: 03/06/2022 10:42    Procedures .Ortho Injury Treatment  Date/Time: 03/06/2022 11:58 AM  Performed by: Dani Gobble, MD Authorized by: Carmin Muskrat, MD   Consent:    Consent obtained:  Verbal and written   Consent given by:  Patient   Risks discussed:  Recurrent dislocation, fracture, irreducible dislocation, nerve damage and restricted joint movement   Alternatives discussed:  No treatment, alternative treatment, immobilization, referral and delayed treatmentInjury location: hip Location details: left hip Injury type: dislocation Dislocation type: posterior Spontaneous dislocation: yes Prosthesis: yes Pre-procedure neurovascular assessment: neurovascularly intact Pre-procedure distal perfusion: normal Pre-procedure neurological function: normal Pre-procedure range of motion: reduced  Anesthesia: Local anesthesia used: no  Patient sedated: Yes. Refer to sedation procedure documentation for details of sedation. Manipulation performed: yes Reduction method: Allis maneuver Reduction successful: yes X-ray confirmed reduction: yes Immobilization: splint Splint type: long leg Splint Applied by: Sheliah Hatch Post-procedure neurovascular assessment: post-procedure neurovascularly intact Post-procedure distal perfusion: normal Post-procedure neurological function: normal Post-procedure range of motion: unchanged Post-procedure range of motion comment: Range of motion limited by splint   .Sedation  Date/Time: 03/06/2022 12:00 PM  Performed by: Dani Gobble, MD Authorized by: Carmin Muskrat, MD   Consent:     Consent obtained:  Written   Consent given by:  Patient   Risks discussed:  Allergic reaction, dysrhythmia, inadequate sedation, nausea, vomiting, respiratory compromise necessitating ventilatory assistance and intubation and prolonged hypoxia resulting in organ damage   Alternatives discussed:  Analgesia without sedation and anxiolysis Universal protocol:    Procedure explained and questions answered to patient or proxy's satisfaction: yes     Relevant documents present and verified: yes     Test results available: yes     Imaging studies available: yes     Required blood products, implants, devices, and special equipment available: yes     Site/side marked: yes     Immediately prior to procedure, a time out was called: yes   Indications:    Procedure performed:  Dislocation reduction   Procedure necessitating sedation performed by:  Physician performing sedation Pre-sedation assessment:    Time since last food or drink:  8   ASA classification: class 2 - patient with mild systemic disease     Mouth opening:  3 or more finger widths   Thyromental distance:  3 finger widths   Mallampati score:  II - soft palate, uvula, fauces visible   Neck mobility: normal     Pre-sedation assessments completed and reviewed: pre-procedure airway patency not reviewed, pre-procedure cardiovascular function not reviewed, pre-procedure nausea and vomiting status not reviewed and pre-procedure respiratory function not reviewed     History of difficult intubation: yes     Pre-sedation assessment completed:  03/06/2022 11:00 AM Immediate pre-procedure details:    Reassessment: Patient reassessed immediately prior to procedure     Reviewed: vital signs, relevant labs/tests and NPO status     Verified: bag valve mask available, emergency equipment available, intubation equipment available, IV patency confirmed, oxygen available and suction available   Procedure details (see MAR for exact dosages):     Preoxygenation:  Room air   Sedation:  Propofol   Intended level of sedation: moderate (conscious sedation)   Analgesia:  Fentanyl   Intra-procedure monitoring:  Blood pressure monitoring, cardiac monitor, continuous pulse oximetry, continuous capnometry, frequent LOC assessments and frequent vital sign checks   Intra-procedure events: none     Total Provider sedation time (minutes):  12 Post-procedure details:    Post-sedation assessment completed:  03/06/2022 12:16 PM   Attendance: Constant attendance by certified staff until patient recovered     Recovery: Patient returned to pre-procedure baseline     Post-sedation assessments completed and reviewed: post-procedure airway patency not reviewed and post-procedure cardiovascular function not reviewed       Medications Ordered in ED Medications  propofol (DIPRIVAN) 10 mg/mL bolus/IV push 60 mg (60 mg Intravenous See Procedure Record 03/06/22 1207)  fentaNYL (SUBLIMAZE) injection 50 mcg (50 mcg Intravenous Given 03/06/22 1017)  propofol (DIPRIVAN) 10 mg/mL bolus/IV push (30 mg Intravenous Given 03/06/22 1135)    ED Course/ Medical Decision Making/ A&P                           Medical Decision Making The patient is an 88 year old female with past medical history of bilateral hip replacement presenting for evaluation of a posterior left hip dislocation.  On initial assessment, the patient was neurovascularly intact.  A x-ray showed a posterior hip dislocation without fracture.  The patient received pain medications and was sedated in accordance with the procedure section of the note above.  The patient's hip was reduced at bedside and a follow-up x-ray was obtained.  The patient's hip was found to be relocated and she was noted to be neurovascularly intact.  The long-leg knee immobilizer was placed.  The patient was observed in the emergency department until which time she was back to baseline.  The patient was discharged with instructions to  follow-up with her orthopedic surgeon.  Amount and/or Complexity of Data Reviewed Independent Historian: EMS External Data Reviewed: radiology and notes. Radiology: ordered and independent interpretation performed. Decision-making details documented in ED Course.  Risk Prescription drug management.   Patient's presentation is most consistent with acute presentation with potential threat to life or bodily function.         Final Clinical Impression(s) / ED Diagnoses Final diagnoses:  Dislocation of left hip, initial encounter [facility])    Rx / DC Orders ED Discharge Orders     None         Dani Gobble, MD 03/06/22 1533    Carmin Muskrat, MD 03/07/22 1352

## 2022-03-06 NOTE — ED Triage Notes (Signed)
Pt BIB [facility] from home d/t hip dislocation. Pt was putting on socks, then all of the sudden her L hip popped out. Hx bilateral hip placement. A&O X4. Not on thinner.

## 2022-03-06 NOTE — Sedation Documentation (Signed)
Dr Vanita Panda reducing.

## 2022-03-06 NOTE — ED Notes (Signed)
Pt tells this RN to call other grandson Mitzi Hansen [phone] to pick her up, this RN spoke with Mitzi Hansen and states he is on the way for discharge ride home.

## 2022-03-06 NOTE — Discharge Instructions (Addendum)
Please call your orthopedic surgeon tomorrow 11/24 for follow-up.  Avoid flexing the hip to 90 degrees, and maintain the straight leg immobilizer until cleared by orthopedic surgery.

## 2022-03-10 ENCOUNTER — Telehealth: Payer: Self-pay | Admitting: Internal Medicine

## 2022-03-10 NOTE — Telephone Encounter (Signed)
Pt called stating she had a ER visit on Thanksgiving, 03/07/22 for a joint hip issue. Pt stated the hip joint back was put back into place & now she is wearing a brace. Pt was advised from ER surgeon to f/u with pcp. Pt is asking for a call back with advice on what to do? Call back # [phone]

## 2022-03-10 NOTE — Telephone Encounter (Signed)
Spoke to pt. She will start wearing it again.

## 2022-03-10 NOTE — Telephone Encounter (Addendum)
After reviewing ED AVS pt was to contact her orthopedic surgeon; I spoke with pt; pt said she does not have an ortho surgeon and only wants to see Dr Silvio Pate; pt said this has happened before and Dr Silvio Pate helped her with it. Dr Silvio Pate was with pt and I spoke with Dr Lorelei Pont and he said pt should see ortho. I advised pt and she said that she was seen in [facility] ED on 03/06/22 and wore the splint for 4 days and then removed the splint due to something on the splint poking her. Pt said she is doing OK now and the doctor at ED said she could remove the splint after 4 days. I advised pt she could go to [facility] to be seen but pt said she wants to see Dr Silvio Pate first and then if wants her to see ortho she will. Pt said her son has appt on 03/12/22 at 8:15 and could she come with him. I gave pt a 70' ED FU on 03/12/22 at 7:45 with UC & ED precautions that pt voiced understanding. Sending note to Dr Silvio Pate and Silvio Pate pool.     [place] Night - Client TELEPHONE ADVICE RECORD [facility] Patient Name: Sydney [name] Gender: Female DOB: [birth year] Age: 88 Y 33 M 25 D Return Phone Number: [phone] (Primary) Address: City/ State/ Zip: Trail Client [place] Night - Client Client Site [place] Provider Viviana Simpler- MD Contact Type Call Who Is Calling Patient / Member / Family / Caregiver Call Type Triage / Clinical Relationship To Patient Self Return Phone Number [phone] (Primary) Chief Complaint Hip Injury Reason for Call Symptomatic / Request for [place] states she dislocated hip and went to [name] yesterday. She wants to know if she can remove splint. Translation No Nurse Assessment Nurse: Jimmye Norman, RN, [place] Date/Time (Eastern Time): 03/07/2022 8:29:14 AM Confirm and document reason for call. If symptomatic, describe symptoms. ---Caller states she dislocated hip  and went to [name] yesterday. She wants to know if she can remove the soft cast. States her hip joint went out, states that they popped the joint back. Does the patient have any new or worsening symptoms? ---Yes Will a triage be completed? ---Yes Related visit to physician within the last 2 weeks? ---Yes Does the PT have any chronic conditions? (i.e. diabetes, asthma, this includes High risk factors for pregnancy, etc.) ---No Is this a behavioral health or substance abuse call? ---No Guidelines Guideline Title Affirmed Question Affirmed Notes Nurse Date/Time (Eastern Time) Splint Symptoms and Questions Splint removal date, questions about Pricilla Handler, Loree Fee 03/07/2022 8:34:14 AM Disp. Time Eilene Ghazi Time) Disposition Final User 03/07/2022 8:35:08 AM Call PCP when Office is Open Yes Jimmye Norman, RN, Loree Fee Final Disposition 03/07/2022 8:35:08 AM Call PCP when Office is Open Yes Jimmye Norman, RN, Loree Fee PLEASE NOTE: All timestamps contained within this report are represented as [place] Standard Time. CONFIDENTIALTY NOTICE: This fax transmission is intended only for the addressee. It contains information that is legally privileged, confidential or otherwise protected from use or disclosure. If you are not the intended recipient, you are strictly prohibited from reviewing, disclosing, copying using or disseminating any of this information or taking any action in reliance on or regarding this information. If you have received this fax in error, please notify us immediately by telephone so that we can arrange for its return to [place]. Phone: [phone], Toll-Free: [phone], Fax: [phone] Page: 2  of 2 Call Id: [id number] Caller Disagree/Comply Comply Caller Understands Yes PreDisposition Call Doctor Care Advice Given Per Guideline CALL PCP WHEN OFFICE IS OPEN: * You need to discuss this with your doctor (or NP/PA) within the next few days. CALL BACK IF: * You have more questions  REMOVAL DATE: * Most splints are replaced by casts after the swelling has gone down (2 to 4 days). Keeping them on longer is not harmful. Referrals REFERRED TO PCP OFFIC

## 2022-03-12 ENCOUNTER — Encounter: Payer: Self-pay | Admitting: Internal Medicine

## 2022-03-12 ENCOUNTER — Ambulatory Visit (INDEPENDENT_AMBULATORY_CARE_PROVIDER_SITE_OTHER): Payer: Medicare PPO | Admitting: Internal Medicine

## 2022-03-12 VITALS — BP 136/84 | HR 68 | Temp 98.5°F | Ht 64.5 in | Wt 149.0 lb

## 2022-03-12 DIAGNOSIS — S73005S Unspecified dislocation of left hip, sequela: Secondary | ICD-10-CM | POA: Diagnosis not present

## 2022-03-12 DIAGNOSIS — S73005D Unspecified dislocation of left hip, subsequent encounter: Secondary | ICD-10-CM | POA: Diagnosis not present

## 2022-03-12 DIAGNOSIS — S73005A Unspecified dislocation of left hip, initial encounter: Secondary | ICD-10-CM | POA: Insufficient documentation

## 2022-03-12 NOTE — Assessment & Plan Note (Signed)
Back in place Discussed using the brace as tolerated Keep to hip precautions Back to ortho if recurs

## 2022-03-12 NOTE — Patient Instructions (Signed)
Closed Reduction for Prosthetic Hip Joint Dislocation, Care After This sheet gives you information about how to care for yourself after your procedure. Your health care provider may also give you more specific instructions. If you have problems or questions, contact your health care provider. What can I expect after the procedure? After the procedure, it is common to have: Hip pain for about a week. The pain should lessen each day. It may take a few weeks to recover completely. Trouble walking or doing your usual daily activities. You may need to use crutches for a period of time. Follow these instructions at home: Medicines Take over-the-counter and prescription medicines only as told by your health care provider. Ask your health care provider if the medicine prescribed to you: Requires you to avoid driving or using machinery. Can cause constipation. You may need to take these actions to prevent or treat constipation: Drink enough fluid to keep your urine pale yellow. Take over-the-counter or prescription medicines. Eat foods that are high in fiber, such as beans, whole grains, and fresh fruits and vegetables. Limit foods that are high in fat and processed sugars, such as fried or sweet foods. If you have a brace: Wear the brace as told by your health care provider. Remove it only as told by your health care provider. Loosen the brace if your toes tingle, become numb, or turn cold and blue. Keep the brace clean and dry. Bathing If the brace is not waterproof: Do not let it get wet. Cover it with a watertight covering when you take a bath or shower. Managing pain, stiffness, and swelling  If directed, put ice on the affected area. To do this: If you have a removable brace, remove it as told by your health care provider. Put ice in a plastic bag. Place a towel between your skin and the bag or between your brace and the bag. Leave the ice on for 20 minutes, 2-3 times a day. Remove the  ice if your skin turns bright red. This is very important. If you cannot feel pain, heat, or cold, you have a greater risk of damage to the area. Move your toes often to reduce stiffness and swelling. Raise (elevate) your leg above the level of your heart while you are lying down. Activity Ask your health care provider what activities are safe for you. Stop any activity that causes pain or discomfort. Ask for help with activities that are difficult. Do not use your injured leg to support (bear) your body weight until your health care provider says that you can. Follow instructions about how much weight you may safely support on your affected leg (weight-bearing restrictions). Use crutches as told by your health care provider. When you stop using your crutches, walk and move slowly until you feel stable. Do not lift anything that is heavier than 10 lb (4.5 kg), or the limit that you are told, until your health care provider says that it is safe. Do exercises as told by your health care provider. Movement restrictions  Follow all hip dislocation precautions in any position (standing, sitting, or lying) as told by your health care provider. These precautions may include the following: Do not cross your legs at the knees. To remind yourself about this, you may keep a pillow between your legs while lying in bed. Do not bend forward at the waist (to avoid hip flexion of more then 90 degrees). To avoid bending this far: Do not bring your knees higher than the  level of your hips. Do not pick up something from the floor while sitting in a chair. Avoid sitting in low chairs. Use a raised toilet seat When standing up from a seated position, keep the injured leg out in front of you. Do not bend down to a squat position while in the shower. You may need someone to help you while you shower. Avoid twisting at your waist and reaching across your body to the side of the affected leg. Avoid rotating the toes of  your affected leg inward. When getting into a car: Raise the seat as high as possible, move the seat as far back as it will go, and recline the upper part of the seat slightly. Sit down into the seat with your injured leg extended out of the car. Scoot back in the seat as you move the lower half of your body into the car. Try to avoid bumping your foot or leg as you bring it into the car. General instructions Ask your health care provider when it is safe for you to drive. Do not use any products that contain nicotine or tobacco, such as cigarettes, e-cigarettes, and chewing tobacco. These can delay healing. If you need help quitting, ask your health care provider. Keep all follow-up visits. This is important. Contact a health care provider if: Walking or moving is not getting easier for you. Your calf swells or feels tender. You have swelling that gets worse or is severe. Any part of your hip or leg feels numb, tingles, burns, or stings. You have pain that does not get better with medicine. Your brace is damaged or has gotten wet and is not waterproof. Get help right away if: You think you have dislocated your hip again. You cannot move your leg. You have trouble breathing. You have chest pain. These symptoms may represent a serious problem that is an emergency. Do not wait to see if the symptoms will go away. Get medical help right away. Call your local emergency services (911 in the [place] Do not drive yourself to the hospital. Summary Do not use your injured leg to support your body weight until your health care provider says that you can. Follow instructions about how much weight you may safely support on your affected leg (weight-bearing restrictions). Use crutches as told by your health care provider. When you stop using your crutches, move slowly at first. Keep all follow-up visits. This is important. This information is not intended to replace advice given to you by your health care  provider. Make sure you discuss any questions you have with your health care provider. Document Revised: 08/26/2019 Document Reviewed: 08/26/2019 Elsevier Patient Education  [place].

## 2022-03-12 NOTE — Progress Notes (Signed)
Subjective:    Patient ID: Sydney [name], female    DOB: Apr 18, 1932, 88 y.o.   MRN: [id number]  HPI Here for ER follow up  Was sitting on bed and bent over to put on socks Slipped and hip popped out Called EMS Left hip dislocated-----put back in with sedation Was wearing brace till today--hard to walk in it and was "poking into my leg"  No pain Has been careful about not externally rotating  Hip replaced about 20 years ago (Al[facility]io?) Prior dislocation about 5 years ago  Current Outpatient Medications on File Prior to Visit  Medication Sig Dispense Refill   acetaminophen (TYLENOL) 500 MG tablet Take 1 tablet (500 mg total) by mouth every 6 (six) hours as needed for mild pain. 30 tablet 0   Ascorbic Acid (VITAMIN C) 500 MG CHEW Chew 2 each by mouth in the morning.     OVER THE COUNTER MEDICATION Take 1 each by mouth 2 (two) times daily. Vitafusion gummy     pantoprazole (PROTONIX) 40 MG tablet TAKE 1 TABLET BY MOUTH EVERY DAY (Patient not taking: Reported on 03/12/2022) 90 tablet 3   No current facility-administered medications on file prior to visit.    Allergies  Allergen Reactions   Lipitor [Atorvastatin]     Dizzy ( fuzziness)   Latex Rash    Past Medical History:  Diagnosis Date   GERD (gastroesophageal reflux disease)    Hx of lumbosacral spine surgery t   Hyperlipidemia    Hypertension    Iliac artery aneurysm (HCC)    Lumbar spinal stenosis    Polymyalgia rheumatica ([place]) 06/2016    Past Surgical History:  Procedure Laterality Date   JOINT REPLACEMENT  1992   right hip replacement   JOINT REPLACEMENT  2002   left hip replacement   ORIF ORBITAL FRACTURE Right 08/24/2020   Procedure: open reduction internal fixation of right orbital floor fracture;  Surgeon: Cindra Presume, MD;  Location: [facility];  Service: Plastics;  Laterality: Right;   ROTATOR CUFF REPAIR  2002   Left shoulder   SPINE SURGERY  2006 & 2010   posterior spinal fusion L4-5 L5S1    Family  History  Problem Relation Age of Onset   Heart disease Mother    Heart disease Father        Heart Disease before age 71   Heart attack Father    Hyperlipidemia Father    Heart disease Sister    Diabetes Sister    Heart disease Brother    Hyperlipidemia Brother    Heart disease Brother    Cancer Brother     Social History   Socioeconomic History   Marital status: Widowed    Spouse name: Not on file   Number of children: Not on file   Years of education: Not on file   Highest education level: Not on file  Occupational History   Not on file  Tobacco Use   Smoking status: Never    Passive exposure: Past   Smokeless tobacco: Never  Substance and Sexual Activity   Alcohol use: No    Alcohol/week: 0.0 standard drinks of alcohol   Drug use: No   Sexual activity: Not on file  Other Topics Concern   Not on file  Social History Narrative   Has living will   Daughter [name] is health care POA (then son [name])   Discussed DNR--she requests (done 11/30/12)   No tube feedings if cognitively unaware  Social Determinants of Health   Financial Resource Strain: Not on file  Food Insecurity: Not on file  Transportation Needs: Not on file  Physical Activity: Not on file  Stress: Not on file  Social Connections: Not on file  Intimate Partner Violence: Not on file   Review of Systems Still walking with cane or walker     Objective:   Physical Exam Musculoskeletal:     Comments: Left hip in place No tenderness Independent with transfers and walks well with cane            Assessment & Plan:

## 2022-03-13 ENCOUNTER — Telehealth: Payer: Self-pay

## 2022-03-13 NOTE — Chronic Care Management (AMB) (Addendum)
    Chronic Care Management Pharmacy Assistant   Name: Sydney [name]  MRN: [id number] DOB: 1933/01/22  Reason for Encounter: Hospital Follow Up Non CCM    Medications: Outpatient Encounter Medications as of 03/13/2022  Medication Sig   acetaminophen (TYLENOL) 500 MG tablet Take 1 tablet (500 mg total) by mouth every 6 (six) hours as needed for mild pain.   Ascorbic Acid (VITAMIN C) 500 MG CHEW Chew 2 each by mouth in the morning.   OVER THE COUNTER MEDICATION Take 1 each by mouth 2 (two) times daily. Vitafusion gummy   pantoprazole (PROTONIX) 40 MG tablet TAKE 1 TABLET BY MOUTH EVERY DAY (Patient not taking: Reported on 03/12/2022)   No facility-administered encounter medications on file as of 03/13/2022.      Reviewed hospital notes for details of recent visit. Has patient been contacted by Transitions of Care team? Yes Has patient seen PCP/specialist for hospital follow up (summarize OV if yes): Yes 03/12/22-Sydney Letvak,MD(PCP)- stay off leg and may have go use crutches,use brace as tolerated f/u orthopedic.  Admitted to the ED on 03/06/22. Discharge date was 03/06/22.  Discharged from [facility] ED Discharge diagnosis (Principal Problem): left hip dislocation Patient was discharged to Home  Brief summary of hospital course:  88 year old female with past medical history of bilateral hip replacements with recurrent dislocations presenting for evaluation of left hip pain On initial assessment, the patient was neurovascularly intact. A x-ray showed a posterior hip dislocation without fracture. The patient received pain medications and was sedated in accordance with the procedure section of the note above. The patient's hip was reduced at bedside and a follow-up x-ray was obtained. The patient's hip was found to be relocated and she was noted to be neurovascularly intact. The long-leg knee immobilizer was placed. The patient was observed in the emergency department  until which time she was back to baseline. The patient was discharged with instructions to follow-up with her orthopedic surgeon. Avoid flexing leg >90 and maintain straight leg immobilizer.   Medications that remain the same after Hospital Discharge:??  -All other medications will remain the same.    Next CCM appt: none  Other upcoming appts: PCP appointment on 03/12/22  [name], PharmD notified and will determine if action is needed.    Avel Sensor, [place]  [phone]   Pharmacist addendum: Pt has seen PCP for follow up. No further action needed.  [name], PharmD, BCACP 03/13/22 2:24 PM

## 2022-03-31 IMAGING — CT CT ORBITS W/O CM
3 series · 14 of 47 positions shown, 16 images · non-contrast
Comparison: None.

CLINICAL DATA: Patient fell striking the side of her face on a
dresser.

EXAM:
CT HEAD WITHOUT CONTRAST
CT ORBITS WITHOUT CONTRAST
CT CERVICAL SPINE WITHOUT CONTRAST
TECHNIQUE: Multidetector CT imaging of the head, cervical spine, and
maxillofacial structures were performed using the standard protocol
without intravenous contrast. Multiplanar CT image reconstructions
of the cervical spine and maxillofacial structures were also
generated.

[Series 3: orbits 2.0 st · axial · 0.36mm/px · z∈[-134,-22]mm · 8 of 66 slices shown, 10 images]
[im 5/66  brain]
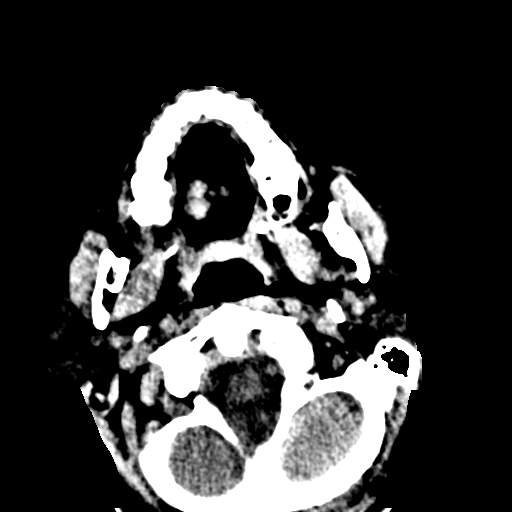
[im 5/66  bone]
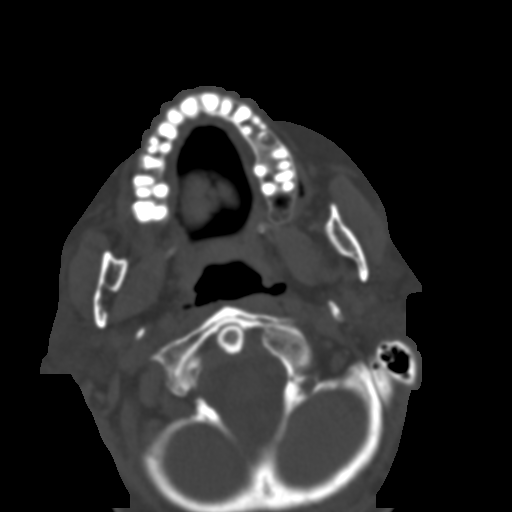
[im 14/66  bone]
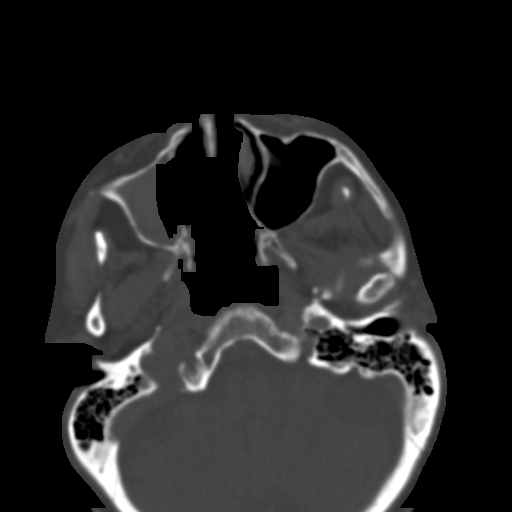
[im 21/66  bone]
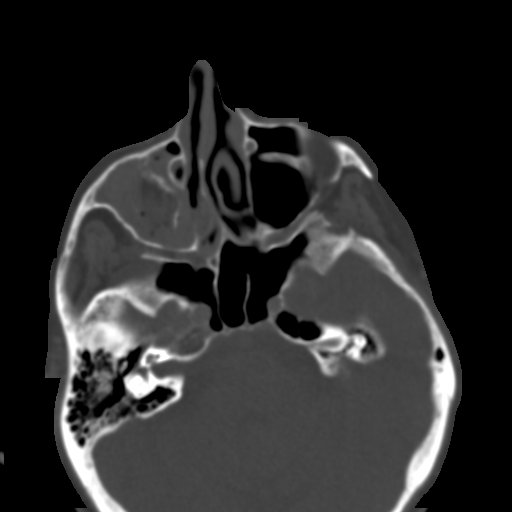
[im 30/66  bone]
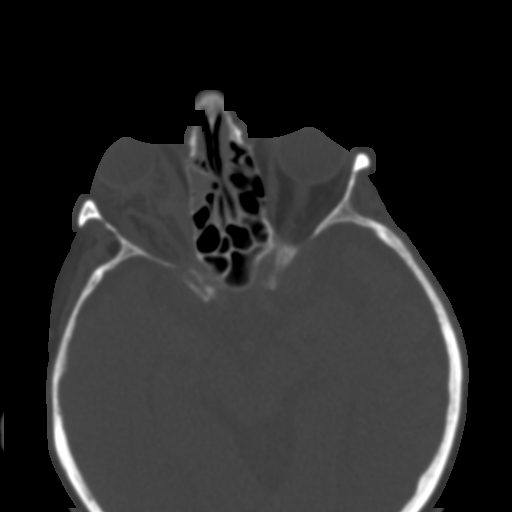
[im 36/66  brain]
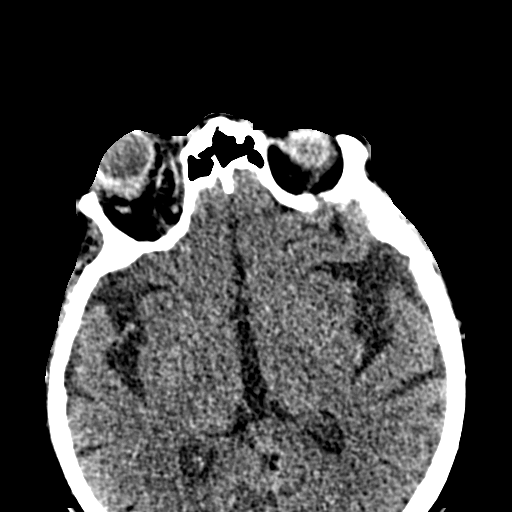
[im 36/66  bone]
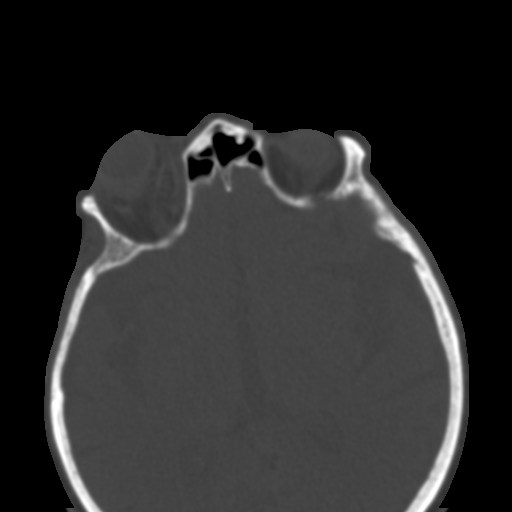
[im 45/66  bone]
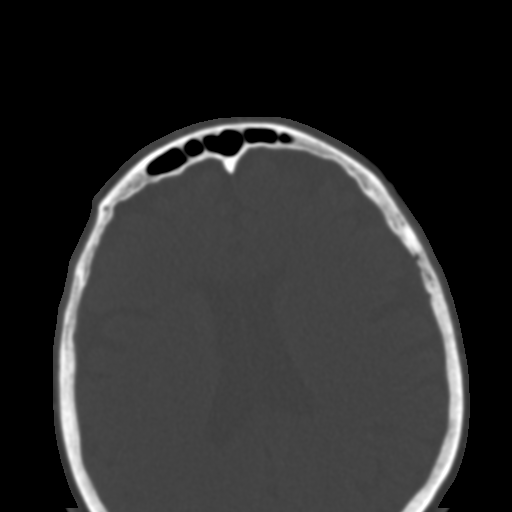
[im 52/66  bone]
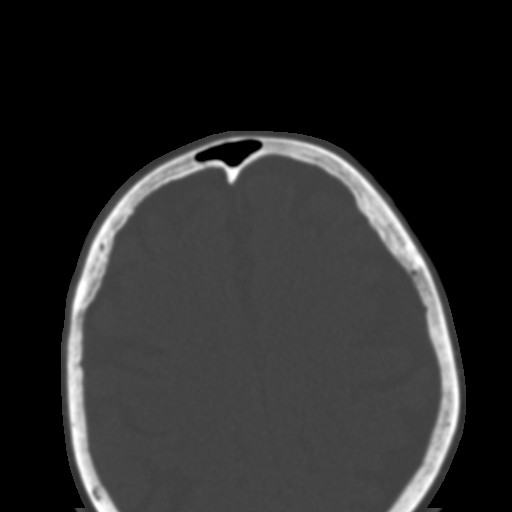
[im 61/66  bone]
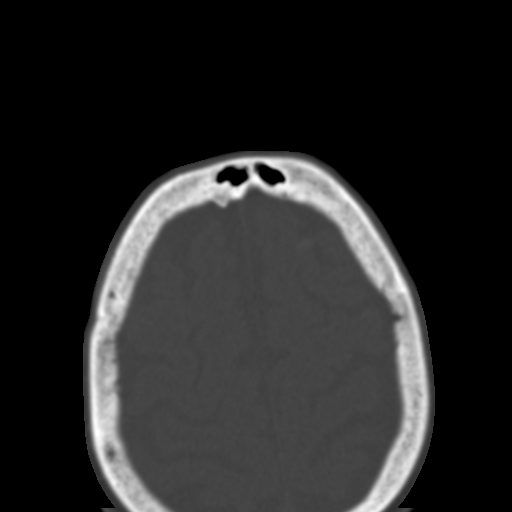

[Series 9: orbits 2.0 cor st · coronal · 0.28mm/px · 3 of 78 slices shown]
[im 26/78  bone]
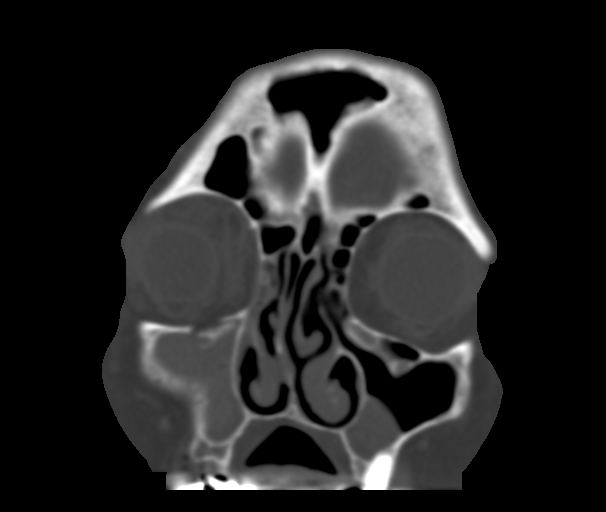
[im 35/78  bone]
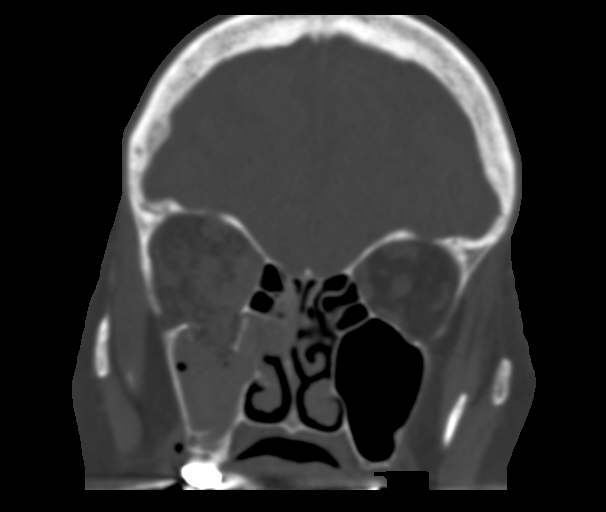
[im 43/78  bone]
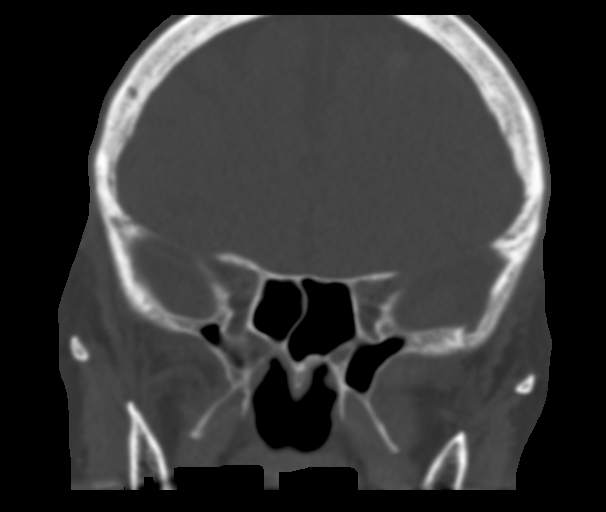

[Series 10: orbits 2.0 sag st · sagittal · 0.29mm/px · 3 of 85 slices shown]
[im 29/85  bone]
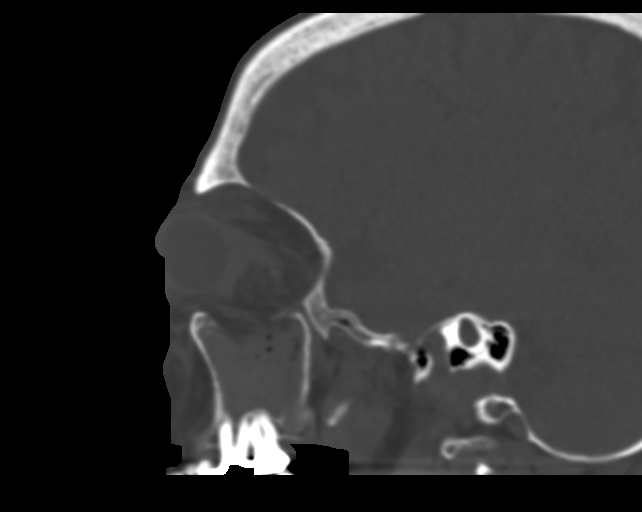
[im 43/85  bone]
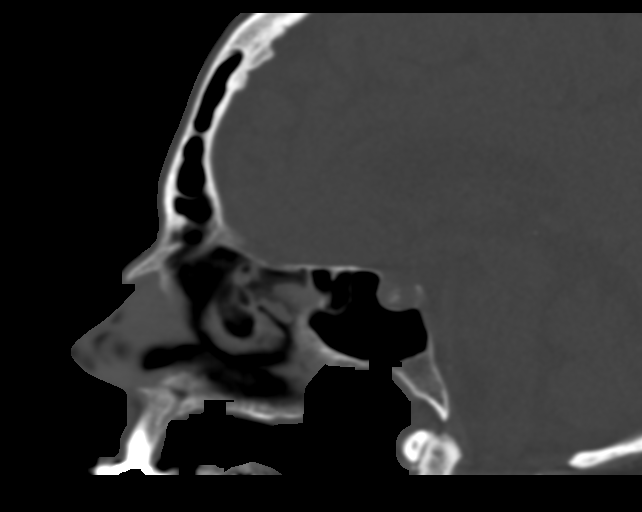
[im 57/85  bone]
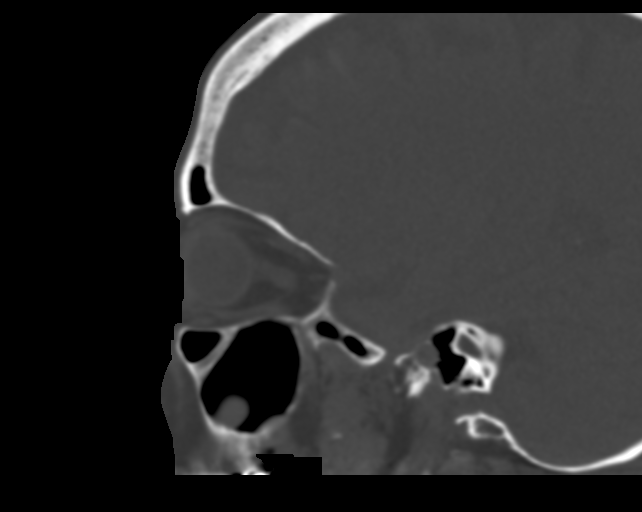

[14 of 47 positions shown; findings below may reference images not displayed]

FINDINGS: CT HEAD FINDINGS

Brain: Small amount of subarachnoid hemorrhage lies along the
anterior left frontal lobe.

No other intracranial hemorrhage.

Ventricles are normal in size, for the patient's age, and normal in
configuration. There are no parenchymal masses or mass effect. There
is no evidence of ischemic infarction. Mild hypoattenuation lines
the periventricular white matter consistent with chronic
microvascular ischemic change.

No extra-axial masses.

Vascular: No hyperdense vessel or unexpected calcification.

Skull: No skull fracture.

Other: None.

CT ORBIT FINDINGS

Osseous: Comminuted, displaced and inferiorly depressed fractures of
the right orbital floor, depressed into the maxillary sinus by
cm. Fractures extend from just posterior to the inferior orbital rim
posteriorly to near the orbital apex. There is a nondisplaced
fracture of the anterior, inferior medial orbital wall on the right.

No other fractures.  No bone lesions.

Orbits: Right-sided exophthalmos. There is hemorrhage in the right
postseptal orbit, intraconal as well as extraconal spaces. The right
globe is intact. The extraocular muscles and optic nerve appear
intact.

Normal left globe and orbit.

Sinuses: Right maxillary sinus is opacified with hemorrhage. Mild
mucosal thickening lines multiple right ethmoid air cells. Remaining
sinuses and the mastoid air cells are clear.

Soft tissues: Right periorbital soft tissue swelling/hemorrhage.

CT CERVICAL SPINE FINDINGS

Alignment: Normal.

Skull base and vertebrae: No acute fracture. No primary bone lesion
or focal pathologic process.

Soft tissues and spinal canal: No prevertebral fluid or swelling. No
visible canal hematoma.

Disc levels: Moderate to marked loss of disc space noted from C3-C4
through the upper thoracic spine associated with endplate spurring
and varying degrees of disc bulging. There are facet degenerative
changes, less pronounced, most evident on the right at C2-C3. No
convincing disc herniation.

Upper chest: Enlarged heterogeneous thyroid gland. No acute
findings.

Other: None.
IMPRESSION: HEAD CT

1. Small amount of subarachnoid hemorrhage over the anterior left
frontal lobe.
2. No other acute intracranial abnormality.
3. No skull fracture.

ORBITAL CT

1. Comminuted depressed right orbital floor fractures, with a
fracture also involving the adjacent anterior inferior right medial
orbital wall.
2. Right-sided exophthalmos due to postseptal intra and extraconal
orbital hemorrhage. Right globe appears intact.

CERVICAL CT

1. No fracture or acute finding.
2. Enlarged heterogeneous thyroid gland. Recommend
nonemergent/urgent thyroid ultrasound (ref: [HOSPITAL]. [DATE]): 143-50).

Critical Value/emergent results were called by telephone at the time
of interpretation on 08/18/2020 at [DATE] to provider CHRISTOPHER ROMAIN
, who verbally acknowledged these results.

## 2022-04-08 ENCOUNTER — Encounter: Payer: Medicare PPO | Admitting: Internal Medicine

## 2022-04-10 ENCOUNTER — Ambulatory Visit (INDEPENDENT_AMBULATORY_CARE_PROVIDER_SITE_OTHER): Payer: Medicare PPO | Admitting: Internal Medicine

## 2022-04-10 ENCOUNTER — Encounter: Payer: Self-pay | Admitting: Internal Medicine

## 2022-04-10 VITALS — BP 138/84 | HR 80 | Temp 97.9°F | Ht 64.5 in | Wt 162.0 lb

## 2022-04-10 DIAGNOSIS — I10 Essential (primary) hypertension: Secondary | ICD-10-CM

## 2022-04-10 NOTE — Progress Notes (Signed)
Subjective:    Patient ID: Sydney [name], female    DOB: July 23, 1932, 88 y.o.   MRN: [id number]  HPI  Visit is too soon We will reschedule for a few months  Current Outpatient Medications on File Prior to Visit  Medication Sig Dispense Refill   acetaminophen (TYLENOL) 500 MG tablet Take 1 tablet (500 mg total) by mouth every 6 (six) hours as needed for mild pain. 30 tablet 0   Ascorbic Acid (VITAMIN C) 500 MG CHEW Chew 2 each by mouth in the morning.     OVER THE COUNTER MEDICATION Take 1 each by mouth 2 (two) times daily. Vitafusion gummy     pantoprazole (PROTONIX) 40 MG tablet TAKE 1 TABLET BY MOUTH EVERY DAY 90 tablet 3   No current facility-administered medications on file prior to visit.    Allergies  Allergen Reactions   Lipitor [Atorvastatin]     Dizzy ( fuzziness)   Latex Rash    Past Medical History:  Diagnosis Date   GERD (gastroesophageal reflux disease)    Hx of lumbosacral spine surgery t   Hyperlipidemia    Hypertension    Iliac artery aneurysm (HCC)    Lumbar spinal stenosis    Polymyalgia rheumatica ([place]) 06/2016    Past Surgical History:  Procedure Laterality Date   JOINT REPLACEMENT  1992   right hip replacement   JOINT REPLACEMENT  2002   left hip replacement   ORIF ORBITAL FRACTURE Right 08/24/2020   Procedure: open reduction internal fixation of right orbital floor fracture;  Surgeon: Cindra Presume, MD;  Location: [facility];  Service: Plastics;  Laterality: Right;   ROTATOR CUFF REPAIR  2002   Left shoulder   SPINE SURGERY  2006 & 2010   posterior spinal fusion L4-5 L5S1    Family History  Problem Relation Age of Onset   Heart disease Mother    Heart disease Father        Heart Disease before age 8   Heart attack Father    Hyperlipidemia Father    Heart disease Sister    Diabetes Sister    Heart disease Brother    Hyperlipidemia Brother    Heart disease Brother    Cancer Brother     Social History   Socioeconomic History    Marital status: Widowed    Spouse name: Not on file   Number of children: Not on file   Years of education: Not on file   Highest education level: Not on file  Occupational History   Not on file  Tobacco Use   Smoking status: Never    Passive exposure: Past   Smokeless tobacco: Never  Substance and Sexual Activity   Alcohol use: No    Alcohol/week: 0.0 standard drinks of alcohol   Drug use: No   Sexual activity: Not on file  Other Topics Concern   Not on file  Social History Narrative   Has living will   Daughter [name] is health care POA (then son [name])   Discussed DNR--she requests (done 11/30/12)   No tube feedings if cognitively unaware   Social Determinants of Health   Financial Resource Strain: Not on file  Food Insecurity: Not on file  Transportation Needs: Not on file  Physical Activity: Not on file  Stress: Not on file  Social Connections: Not on file  Intimate Partner Violence: Not on file   Review of Systems     Objective:   Physical Exam  Assessment & Plan:

## 2022-04-10 NOTE — Progress Notes (Signed)
Hearing Screening - Comments:: Did not pass whisper test Vision Screening - Comments:: August 2023

## 2022-04-10 NOTE — Assessment & Plan Note (Signed)
BP Readings from Last 3 Encounters:  04/10/22 138/84  03/12/22 136/84  03/06/22 (!) 142/87   Still okay without meds

## 2022-04-23 ENCOUNTER — Encounter: Payer: Medicare PPO | Admitting: Internal Medicine

## 2022-06-30 ENCOUNTER — Ambulatory Visit (INDEPENDENT_AMBULATORY_CARE_PROVIDER_SITE_OTHER): Payer: Medicare PPO | Admitting: Internal Medicine

## 2022-06-30 ENCOUNTER — Encounter: Payer: Self-pay | Admitting: Internal Medicine

## 2022-06-30 VITALS — BP 138/88 | HR 80 | Temp 98.1°F | Ht 64.25 in | Wt 159.0 lb

## 2022-06-30 DIAGNOSIS — M353 Polymyalgia rheumatica: Secondary | ICD-10-CM | POA: Diagnosis not present

## 2022-06-30 DIAGNOSIS — K219 Gastro-esophageal reflux disease without esophagitis: Secondary | ICD-10-CM

## 2022-06-30 DIAGNOSIS — I723 Aneurysm of iliac artery: Secondary | ICD-10-CM | POA: Diagnosis not present

## 2022-06-30 DIAGNOSIS — I10 Essential (primary) hypertension: Secondary | ICD-10-CM

## 2022-06-30 DIAGNOSIS — M159 Polyosteoarthritis, unspecified: Secondary | ICD-10-CM | POA: Diagnosis not present

## 2022-06-30 DIAGNOSIS — Z0000 Encounter for general adult medical examination without abnormal findings: Secondary | ICD-10-CM

## 2022-06-30 LAB — COMPREHENSIVE METABOLIC PANEL
ALT: 12 U/L (ref 0–35)
AST: 24 U/L (ref 0–37)
Albumin: 3.9 g/dL (ref 3.5–5.2)
Alkaline Phosphatase: 58 U/L (ref 39–117)
BUN: 12 mg/dL (ref 6–23)
CO2: 25 mEq/L (ref 19–32)
Calcium: 10.4 mg/dL (ref 8.4–10.5)
Chloride: 106 mEq/L (ref 96–112)
Creatinine, Ser: 0.99 mg/dL (ref 0.40–1.20)
GFR: 51.13 mL/min — ABNORMAL LOW (ref >60.00)
Glucose, Bld: 97 mg/dL (ref 70–99)
Potassium: 4.3 mEq/L (ref 3.5–5.1)
Sodium: 141 mEq/L (ref 135–145)
Total Bilirubin: 0.7 mg/dL (ref 0.2–1.2)
Total Protein: 7 g/dL (ref 6.0–8.3)

## 2022-06-30 LAB — CBC
HCT: 47.2 % — ABNORMAL HIGH (ref 36.0–46.0)
Hemoglobin: 15.4 g/dL — ABNORMAL HIGH (ref 12.0–15.0)
MCHC: 32.7 g/dL (ref 30.0–36.0)
MCV: 92.9 fl (ref 78.0–100.0)
Platelets: 180 10*3/uL (ref 150.0–400.0)
RBC: 5.09 Mil/uL (ref 3.87–5.11)
RDW: 13.6 % (ref 11.5–15.5)
WBC: 5.4 10*3/uL (ref 4.0–10.5)

## 2022-06-30 LAB — SEDIMENTATION RATE: Sed Rate: 29 mm/hr (ref 0–30)

## 2022-06-30 NOTE — Assessment & Plan Note (Signed)
Uses tylenol prn 

## 2022-06-30 NOTE — Assessment & Plan Note (Signed)
On imaging No action due to small size and age

## 2022-06-30 NOTE — Progress Notes (Signed)
Hearing Screening - Comments:: Did not pass whisper test Vision Screening - Comments:: October 2023  

## 2022-06-30 NOTE — Progress Notes (Signed)
Subjective:    Patient ID: Sydney [name], female    DOB: 06/30/1932, 89 y.o.   MRN: [id number]  HPI Here with son for Medicare wellness visit and follow up of chronic health conditions Reviewed advanced directives Reviewed other doctors---Dr Bulakowski---opto No hospitalizations or surgery this year--just the one ER visit Vision is okay Hearing is okay--not great  No falls Occasional wine No tobacco Has some down days--gets lonely. Has lot of family and church visitors. Not anhedonic Does try to do some walking and has seated stepper No sig memory issues  No further problems with the hip--has stayed in place Uses walker at home--cane at times Son [name] does her shopping She does a little cooking--and folks bring in prepared meals Independent with ADLs Has housekeeping by DIL  Eating okay--not a big appetite Weight has stablilized  Still has some muscle aching--mostly in shoulders and back Voltaren gel helps  Takes pantoprazole----controls heartburn No dysphagia  Know iliac artery aneurysm---no action  Current Outpatient Medications on File Prior to Visit  Medication Sig Dispense Refill   acetaminophen (TYLENOL) 500 MG tablet Take 1 tablet (500 mg total) by mouth every 6 (six) hours as needed for mild pain. 30 tablet 0   Ascorbic Acid (VITAMIN C) 500 MG CHEW Chew 2 each by mouth in the morning.     OVER THE COUNTER MEDICATION Take 1 each by mouth 2 (two) times daily. Vitafusion gummy     pantoprazole (PROTONIX) 40 MG tablet TAKE 1 TABLET BY MOUTH EVERY DAY 90 tablet 3   No current facility-administered medications on file prior to visit.    Allergies  Allergen Reactions   Lipitor [Atorvastatin]     Dizzy ( fuzziness)   Latex Rash    Past Medical History:  Diagnosis Date   GERD (gastroesophageal reflux disease)    Hx of lumbosacral spine surgery t   Hyperlipidemia    Hypertension    Iliac artery aneurysm (HCC)    Lumbar spinal stenosis    Polymyalgia  rheumatica ([place]) 06/2016    Past Surgical History:  Procedure Laterality Date   JOINT REPLACEMENT  1992   right hip replacement   JOINT REPLACEMENT  2002   left hip replacement   ORIF ORBITAL FRACTURE Right 08/24/2020   Procedure: open reduction internal fixation of right orbital floor fracture;  Surgeon: Cindra Presume, MD;  Location: [facility];  Service: Plastics;  Laterality: Right;   ROTATOR CUFF REPAIR  2002   Left shoulder   SPINE SURGERY  2006 & 2010   posterior spinal fusion L4-5 L5S1    Family History  Problem Relation Age of Onset   Heart disease Mother    Heart disease Father        Heart Disease before age 45   Heart attack Father    Hyperlipidemia Father    Heart disease Sister    Diabetes Sister    Heart disease Brother    Hyperlipidemia Brother    Heart disease Brother    Cancer Brother     Social History   Socioeconomic History   Marital status: Widowed    Spouse name: Not on file   Number of children: Not on file   Years of education: Not on file   Highest education level: Not on file  Occupational History   Not on file  Tobacco Use   Smoking status: Never    Passive exposure: Past   Smokeless tobacco: Never  Substance and Sexual Activity  Alcohol use: No    Alcohol/week: 0.0 standard drinks of alcohol   Drug use: No   Sexual activity: Not on file  Other Topics Concern   Not on file  Social History Narrative   Has living will   Daughter [name] is health care POA (then son [name])   Discussed DNR--she requests (done 11/30/12)   No tube feedings if cognitively unaware   Social Determinants of Health   Financial Resource Strain: Not on file  Food Insecurity: Not on file  Transportation Needs: Not on file  Physical Activity: Not on file  Stress: Not on file  Social Connections: Not on file  Intimate Partner Violence: Not on file   Review of Systems Sleeps fair---can be up if something on her mind Teeth okay--doesn't see dentist Wears  seat belt Bowels are okay---uses dulcolax occasionally Has some brown spots she wants checked No chest pain or SOB No dizziness or syncope     Objective:   Physical Exam Constitutional:      Appearance: Normal appearance.  HENT:     Mouth/Throat:     Pharynx: No oropharyngeal exudate or posterior oropharyngeal erythema.  Eyes:     Conjunctiva/sclera: Conjunctivae normal.     Pupils: Pupils are equal, round, and reactive to light.  Cardiovascular:     Rate and Rhythm: Normal rate and regular rhythm.     Pulses: Normal pulses.     Heart sounds: No murmur heard.    No gallop.  Pulmonary:     Effort: Pulmonary effort is normal.     Breath sounds: Normal breath sounds. No wheezing or rales.  Abdominal:     Palpations: Abdomen is soft.     Tenderness: There is no abdominal tenderness.  Musculoskeletal:     Cervical back: Neck supple.     Right lower leg: No edema.     Left lower leg: No edema.  Lymphadenopathy:     Cervical: No cervical adenopathy.  Skin:    Findings: No rash.  Neurological:     General: No focal deficit present.     Mental Status: She is alert and oriented to person, place, and time.     Comments: Word naming-- 13/1 minute Recall 1/3  Psychiatric:        Mood and Affect: Mood normal.        Behavior: Behavior normal.            Assessment & Plan:

## 2022-06-30 NOTE — Assessment & Plan Note (Signed)
Has some aching but not major symptoms Would not treat unless sed rate very high

## 2022-06-30 NOTE — Assessment & Plan Note (Signed)
Quiet on the pantoprazole 40mg daily 

## 2022-06-30 NOTE — Assessment & Plan Note (Signed)
I have personally reviewed the Medicare Annual Wellness questionnaire and have noted 1. The patient's medical and social history 2. Their use of alcohol, tobacco or illicit drugs 3. Their current medications and supplements 4. The patient's functional ability including ADL's, fall risks, home safety risks and hearing or visual             impairment. 5. Diet and physical activities 6. Evidence for depression or mood disorders  The patients weight, height, BMI and visual acuity have been recorded in the chart I have made referrals, counseling and provided education to the patient based review of the above and I have provided the pt with a written personalized care plan for preventive services.  I have provided you with a copy of your personalized plan for preventive services. Please take the time to review along with your updated medication list.  Done with cancer screening COVID vaccine update soon Flu and RSV in the fall Consider shingirx at pharmacy

## 2022-06-30 NOTE — Assessment & Plan Note (Signed)
BP Readings from Last 3 Encounters:  06/30/22 138/88  04/10/22 138/84  03/12/22 136/84   Good control without meds

## 2022-07-29 ENCOUNTER — Encounter: Payer: Medicare PPO | Admitting: Internal Medicine

## 2022-09-09 DIAGNOSIS — M810 Age-related osteoporosis without current pathological fracture: Secondary | ICD-10-CM | POA: Diagnosis not present

## 2022-09-09 DIAGNOSIS — M4800 Spinal stenosis, site unspecified: Secondary | ICD-10-CM | POA: Diagnosis not present

## 2022-09-09 DIAGNOSIS — M1990 Unspecified osteoarthritis, unspecified site: Secondary | ICD-10-CM | POA: Diagnosis not present

## 2022-09-09 DIAGNOSIS — Z833 Family history of diabetes mellitus: Secondary | ICD-10-CM | POA: Diagnosis not present

## 2022-09-09 DIAGNOSIS — I10 Essential (primary) hypertension: Secondary | ICD-10-CM | POA: Diagnosis not present

## 2022-09-09 DIAGNOSIS — Z9181 History of falling: Secondary | ICD-10-CM | POA: Diagnosis not present

## 2022-09-09 DIAGNOSIS — R2681 Unsteadiness on feet: Secondary | ICD-10-CM | POA: Diagnosis not present

## 2022-09-09 DIAGNOSIS — Z8249 Family history of ischemic heart disease and other diseases of the circulatory system: Secondary | ICD-10-CM | POA: Diagnosis not present

## 2023-01-21 ENCOUNTER — Ambulatory Visit (INDEPENDENT_AMBULATORY_CARE_PROVIDER_SITE_OTHER): Payer: Medicare PPO

## 2023-01-21 DIAGNOSIS — Z23 Encounter for immunization: Secondary | ICD-10-CM

## 2023-05-26 ENCOUNTER — Other Ambulatory Visit: Payer: Self-pay | Admitting: Internal Medicine

## 2023-07-01 ENCOUNTER — Encounter: Payer: Self-pay | Admitting: Internal Medicine

## 2023-07-01 ENCOUNTER — Ambulatory Visit: Payer: Medicare PPO | Admitting: Internal Medicine

## 2023-07-01 VITALS — BP 110/70 | HR 102 | Temp 97.8°F | Ht 64.5 in | Wt 168.0 lb

## 2023-07-01 DIAGNOSIS — Z0000 Encounter for general adult medical examination without abnormal findings: Secondary | ICD-10-CM

## 2023-07-01 DIAGNOSIS — I10 Essential (primary) hypertension: Secondary | ICD-10-CM | POA: Diagnosis not present

## 2023-07-01 DIAGNOSIS — I723 Aneurysm of iliac artery: Secondary | ICD-10-CM

## 2023-07-01 DIAGNOSIS — N1831 Chronic kidney disease, stage 3a: Secondary | ICD-10-CM | POA: Diagnosis not present

## 2023-07-01 DIAGNOSIS — K219 Gastro-esophageal reflux disease without esophagitis: Secondary | ICD-10-CM

## 2023-07-01 DIAGNOSIS — M353 Polymyalgia rheumatica: Secondary | ICD-10-CM | POA: Diagnosis not present

## 2023-07-01 DIAGNOSIS — M159 Polyosteoarthritis, unspecified: Secondary | ICD-10-CM

## 2023-07-01 LAB — CBC
HCT: 49.2 % — ABNORMAL HIGH (ref 36.0–46.0)
Hemoglobin: 16.2 g/dL — ABNORMAL HIGH (ref 12.0–15.0)
MCHC: 32.9 g/dL (ref 30.0–36.0)
MCV: 94.7 fl (ref 78.0–100.0)
Platelets: 194 10*3/uL (ref 150.0–400.0)
RBC: 5.2 Mil/uL — ABNORMAL HIGH (ref 3.87–5.11)
RDW: 13.9 % (ref 11.5–15.5)
WBC: 6.5 10*3/uL (ref 4.0–10.5)

## 2023-07-01 LAB — COMPREHENSIVE METABOLIC PANEL
ALT: 9 U/L (ref 0–35)
AST: 16 U/L (ref 0–37)
Albumin: 4.4 g/dL (ref 3.5–5.2)
Alkaline Phosphatase: 74 U/L (ref 39–117)
BUN: 11 mg/dL (ref 6–23)
CO2: 27 meq/L (ref 19–32)
Calcium: 10.7 mg/dL — ABNORMAL HIGH (ref 8.4–10.5)
Chloride: 103 meq/L (ref 96–112)
Creatinine, Ser: 0.95 mg/dL (ref 0.40–1.20)
GFR: 53.35 mL/min — ABNORMAL LOW (ref >60.00)
Glucose, Bld: 110 mg/dL — ABNORMAL HIGH (ref 70–99)
Potassium: 4.8 meq/L (ref 3.5–5.1)
Sodium: 140 meq/L (ref 135–145)
Total Bilirubin: 0.9 mg/dL (ref 0.2–1.2)
Total Protein: 7.1 g/dL (ref 6.0–8.3)

## 2023-07-01 LAB — TSH: TSH: 0.9 u[IU]/mL (ref 0.35–5.50)

## 2023-07-01 NOTE — Assessment & Plan Note (Signed)
Uses tylenol prn only 

## 2023-07-01 NOTE — Progress Notes (Signed)
Vision Screening   Right eye Left eye Both eyes  Without correction '20/30 20/30 20/30 '$  With correction     Hearing Screening - Comments:: Did not pass whisper test

## 2023-07-01 NOTE — Progress Notes (Signed)
 Subjective:    Patient ID: Sydney [name], female    DOB: July 31, 1932, 89 y.o.   MRN: [id number]  HPI Here with son for Medicare wellness visit and follow up of chronic health conditions Reviewed advanced directives Reviewed other doctors---Dr Bulakowski--opto, Dr Haskel Schroeder No hospitalizations or surgery in the past year Vision is okay Some hearing issues--not enough to be an issue (doesn't go out in social settings but gets visits from folks) Walks in house and does pedal machine. No set exercise Enjoys small glass of wine nightly No tobacco No falls No depression or anhedonia At most mild memory issues---per son (not her)  Gets "fuzzy" feeling at times Does use sleep aide rarely---not PM (?melatonin). Not clearly related to the spells Not dizzy and no syncope Clears on its own No chest pain or SOB No palpitations No edema Known small iliac artery aneurysm  Still living alone Family do shopping Has housekeeper for cleaning Prepared foods --doesn't cook No driving for some time Independent with ADLs, laundry and light kitchen tasks  No regular heartburn Has the pantoprazole for prn No dysphagia  Some back aching--relates to arthritis Uses voltaren gel No sig shoulder/hip myalgia  Current Outpatient Medications on File Prior to Visit  Medication Sig Dispense Refill   acetaminophen (TYLENOL) 500 MG tablet Take 1 tablet (500 mg total) by mouth every 6 (six) hours as needed for mild pain. 30 tablet 0   OVER THE COUNTER MEDICATION Take 1 each by mouth 2 (two) times daily. Vitafusion gummy     pantoprazole (PROTONIX) 40 MG tablet TAKE 1 TABLET BY MOUTH EVERY DAY 90 tablet 0   No current facility-administered medications on file prior to visit.    Allergies  Allergen Reactions   Lipitor [Atorvastatin]     Dizzy ( fuzziness)   Latex Rash    Past Medical History:  Diagnosis Date   GERD (gastroesophageal reflux disease)    Hx of lumbosacral spine surgery t    Hyperlipidemia    Hypertension    Iliac artery aneurysm (HCC)    Lumbar spinal stenosis    Polymyalgia rheumatica (HCC) 06/2016    Past Surgical History:  Procedure Laterality Date   JOINT REPLACEMENT  1992   right hip replacement   JOINT REPLACEMENT  2002   left hip replacement   ORIF ORBITAL FRACTURE Right 08/24/2020   Procedure: open reduction internal fixation of right orbital floor fracture;  Surgeon: Allena Napoleon, MD;  Location: [facility];  Service: Plastics;  Laterality: Right;   ROTATOR CUFF REPAIR  2002   Left shoulder   SPINE SURGERY  2006 & 2010   posterior spinal fusion L4-5 L5S1    Family History  Problem Relation Age of Onset   Heart disease Mother    Heart disease Father        Heart Disease before age 72   Heart attack Father    Hyperlipidemia Father    Heart disease Sister    Diabetes Sister    Heart disease Brother    Hyperlipidemia Brother    Heart disease Brother    Cancer Brother     Social History   Socioeconomic History   Marital status: Widowed    Spouse name: Not on file   Number of children: Not on file   Years of education: Not on file   Highest education level: Not on file  Occupational History   Not on file  Tobacco Use   Smoking status: Never  Passive exposure: Past   Smokeless tobacco: Never  Substance and Sexual Activity   Alcohol use: No    Alcohol/week: 0.0 standard drinks of alcohol   Drug use: No   Sexual activity: Not on file  Other Topics Concern   Not on file  Social History Narrative   Has living will   Son Fredrik Cove is health care POA   Discussed DNR--she requests (done 11/30/12)   No tube feedings if cognitively unaware   Social Drivers of Health   Financial Resource Strain: Not on file  Food Insecurity: Not on file  Transportation Needs: Not on file  Physical Activity: Not on file  Stress: Not on file  Social Connections: Not on file  Intimate Partner Violence: Not on file   Review of Systems Appetite  is okay Weight is stable Sleeps better with aide Wears seat belt Teeth are okay--keeps up with dentist Bowels move fine--no blood Voids fine---no incontinence No suspicious skin lesions---has chronic hyperpigmented areas    Objective:   Physical Exam Constitutional:      Appearance: Normal appearance.  HENT:     Mouth/Throat:     Pharynx: No oropharyngeal exudate or posterior oropharyngeal erythema.  Eyes:     Conjunctiva/sclera: Conjunctivae normal.     Pupils: Pupils are equal, round, and reactive to light.  Cardiovascular:     Rate and Rhythm: Normal rate and regular rhythm.     Pulses: Normal pulses.     Heart sounds: No murmur heard.    No gallop.  Pulmonary:     Effort: Pulmonary effort is normal.     Breath sounds: Normal breath sounds. No wheezing or rales.  Abdominal:     Palpations: Abdomen is soft.     Tenderness: There is no abdominal tenderness.  Musculoskeletal:     Cervical back: Neck supple.     Right lower leg: No edema.     Left lower leg: No edema.  Lymphadenopathy:     Cervical: No cervical adenopathy.  Skin:    Findings: No rash.  Neurological:     General: No focal deficit present.     Mental Status: She is alert and oriented to person, place, and time.     Comments: Mini--cog-----normal  Psychiatric:        Mood and Affect: Mood normal.        Behavior: Behavior normal.            Assessment & Plan:

## 2023-07-01 NOTE — Assessment & Plan Note (Signed)
Still seems to be in remission Will check sed rate 

## 2023-07-01 NOTE — Assessment & Plan Note (Signed)
 I have personally reviewed the Medicare Annual Wellness questionnaire and have noted 1. The patient's medical and social history 2. Their use of alcohol, tobacco or illicit drugs 3. Their current medications and supplements 4. The patient's functional ability including ADL's, fall risks, home safety risks and hearing or visual             impairment. 5. Diet and physical activities 6. Evidence for depression or mood disorders  The patients weight, height, BMI and visual acuity have been recorded in the chart I have made referrals, counseling and provided education to the patient based review of the above and I have provided the pt with a written personalized care plan for preventive services.  I have provided you with a copy of your personalized plan for preventive services. Please take the time to review along with your updated medication list.  No cancer screening due to age Limited activity but tries to do some Flu/COVID updates and RSV in the fall Consider shingrix at the pharmacy

## 2023-07-01 NOTE — Assessment & Plan Note (Signed)
Uses the pantoprazole prn only

## 2023-07-01 NOTE — Assessment & Plan Note (Signed)
 On imaging No action given small size and her age

## 2023-07-01 NOTE — Assessment & Plan Note (Signed)
 Her renal function is stable with GFR in the 50's No action needed at her age

## 2023-07-01 NOTE — Assessment & Plan Note (Signed)
 BP Readings from Last 3 Encounters:  07/01/23 110/70  06/30/22 138/88  04/10/22 138/84   Still okay without meds

## 2023-07-01 NOTE — Addendum Note (Signed)
 Addended by: Tillman Abide I on: 07/01/2023 09:40 AM   Modules accepted: Orders

## 2023-07-10 ENCOUNTER — Telehealth: Payer: Self-pay

## 2023-07-10 NOTE — Telephone Encounter (Signed)
 Copied from CRM [id number]. Topic: Appointments - Appointment Scheduling >> Jul 10, 2023  8:19 AM Alcus Dad wrote: Patient/patient representative is calling to schedule an appointment. Patient has really bad cough, sore throat and her body is aching. Schedule an appt for 3/31 but patient needs some medication for symptoms. Please call son Shayana Hornstein) [phone]

## 2023-07-10 NOTE — Telephone Encounter (Signed)
 Spoke to pt's son. Advised him that I did not feel she should wait until Monday to be seen He said she is taking Theraflu and was feeling better. They will take her to UC if she worsens.

## 2023-07-13 ENCOUNTER — Ambulatory Visit: Admitting: Internal Medicine

## 2023-08-21 ENCOUNTER — Emergency Department (HOSPITAL_COMMUNITY)

## 2023-08-21 ENCOUNTER — Other Ambulatory Visit: Payer: Self-pay

## 2023-08-21 ENCOUNTER — Encounter (HOSPITAL_COMMUNITY): Payer: Self-pay | Admitting: *Deleted

## 2023-08-21 ENCOUNTER — Emergency Department (HOSPITAL_COMMUNITY)
Admission: EM | Admit: 2023-08-21 | Discharge: 2023-08-21 | Disposition: A | Discharge status: Home / Self Care | Attending: Emergency Medicine | Admitting: Emergency Medicine

## 2023-08-21 DIAGNOSIS — N1831 Chronic kidney disease, stage 3a: Secondary | ICD-10-CM | POA: Diagnosis not present

## 2023-08-21 DIAGNOSIS — Z91040 Latex allergy status: Secondary | ICD-10-CM | POA: Diagnosis not present

## 2023-08-21 DIAGNOSIS — Z79899 Other long term (current) drug therapy: Secondary | ICD-10-CM | POA: Diagnosis not present

## 2023-08-21 DIAGNOSIS — M25552 Pain in left hip: Secondary | ICD-10-CM | POA: Diagnosis present

## 2023-08-21 DIAGNOSIS — Z96643 Presence of artificial hip joint, bilateral: Secondary | ICD-10-CM | POA: Diagnosis not present

## 2023-08-21 DIAGNOSIS — M85872 Other specified disorders of bone density and structure, left ankle and foot: Secondary | ICD-10-CM | POA: Diagnosis not present

## 2023-08-21 DIAGNOSIS — T84021A Dislocation of internal left hip prosthesis, initial encounter: Secondary | ICD-10-CM | POA: Diagnosis not present

## 2023-08-21 DIAGNOSIS — M7732 Calcaneal spur, left foot: Secondary | ICD-10-CM | POA: Diagnosis not present

## 2023-08-21 DIAGNOSIS — M25572 Pain in left ankle and joints of left foot: Secondary | ICD-10-CM | POA: Diagnosis not present

## 2023-08-21 DIAGNOSIS — X501XXA Overexertion from prolonged static or awkward postures, initial encounter: Secondary | ICD-10-CM | POA: Insufficient documentation

## 2023-08-21 DIAGNOSIS — Z96642 Presence of left artificial hip joint: Secondary | ICD-10-CM | POA: Diagnosis not present

## 2023-08-21 DIAGNOSIS — S73005A Unspecified dislocation of left hip, initial encounter: Secondary | ICD-10-CM | POA: Insufficient documentation

## 2023-08-21 DIAGNOSIS — M25559 Pain in unspecified hip: Secondary | ICD-10-CM | POA: Diagnosis not present

## 2023-08-21 DIAGNOSIS — S73005D Unspecified dislocation of left hip, subsequent encounter: Secondary | ICD-10-CM | POA: Diagnosis not present

## 2023-08-21 LAB — URINALYSIS, ROUTINE W REFLEX MICROSCOPIC
Bacteria, UA: NONE SEEN
Bilirubin Urine: NEGATIVE
Glucose, UA: NEGATIVE mg/dL
Hgb urine dipstick: NEGATIVE
Ketones, ur: NEGATIVE mg/dL
Nitrite: NEGATIVE
Protein, ur: NEGATIVE mg/dL
Specific Gravity, Urine: 1.012 (ref 1.005–1.030)
pH: 6 (ref 5.0–8.0)

## 2023-08-21 MED ORDER — PROPOFOL 10 MG/ML IV BOLUS
INTRAVENOUS | Status: AC | PRN
  Administered 2023-08-21: 75 mg via INTRAVENOUS

## 2023-08-21 MED ORDER — FENTANYL CITRATE PF 50 MCG/ML IJ SOSY
50.0000 ug | PREFILLED_SYRINGE | Freq: Once | INTRAMUSCULAR | Status: AC
  Administered 2023-08-21: 50 ug via INTRAVENOUS
  Filled 2023-08-21: qty 1

## 2023-08-21 MED ORDER — ONDANSETRON HCL 4 MG/2ML IJ SOLN
4.0000 mg | Freq: Once | INTRAMUSCULAR | Status: AC
  Administered 2023-08-21: 4 mg via INTRAVENOUS
  Filled 2023-08-21: qty 2

## 2023-08-21 MED ORDER — SODIUM CHLORIDE 0.9 % IV BOLUS
500.0000 mL | Freq: Once | INTRAVENOUS | Status: AC
  Administered 2023-08-21: 500 mL via INTRAVENOUS

## 2023-08-21 MED ORDER — PROPOFOL 10 MG/ML IV BOLUS
0.5000 mg/kg | Freq: Once | INTRAVENOUS | Status: AC
  Administered 2023-08-21: 40 mg via INTRAVENOUS
  Filled 2023-08-21: qty 20

## 2023-08-21 MED ORDER — PROPOFOL 10 MG/ML IV BOLUS
INTRAVENOUS | Status: AC | PRN
  Administered 2023-08-21: 25 mg via INTRAVENOUS
  Administered 2023-08-21: 50 mg via INTRAVENOUS
  Administered 2023-08-21 (×3): 25 mg via INTRAVENOUS

## 2023-08-21 NOTE — ED Provider Notes (Signed)
 Shared PA visit.  Patient here with left hip pain.  History of bilateral hip replacement.  She been bending over to pick up her phone when her left hip out of place she thinks.  History of the same.  Been 3 years since this happened.  She is neurovascular neuromuscular intact but tender in the left hip.  Did not hit her head or lose consciousness.  Not on blood thinners.  I do suspect she dislocated her hip will confirm with x-ray I have consented her for the reduction under propofol  sedation.  She understands the risks and benefits.  Under propofol  sedation multiple attempts were made by myself my PA to reduce the hip fracture and we had difficulty doing this.  I contacted Steffanie Edouard with orthopedics he came down and we were eventually able to get the hip reduced.  Postreduction film shows successful reduction.  She is neurovascularly intact before and after reduction.  Placed in a knee immobilizer.  Feeling better and tolerated sedation well without any complications.  She will follow-up with orthopedics outpatient.  .Sedation  Date/Time: 08/21/2023 8:10 AM  Performed by: Lowery Rue, DO Authorized by: Lowery Rue, DO   Consent:    Consent obtained:  Written   Consent given by:  Patient   Risks discussed:  Allergic reaction, dysrhythmia, inadequate sedation, nausea, prolonged hypoxia resulting in organ damage, vomiting, respiratory compromise necessitating ventilatory assistance and intubation and prolonged sedation necessitating reversal   Alternatives discussed:  Analgesia without sedation Universal protocol:    Procedure explained and questions answered to patient or proxy's satisfaction: yes     Relevant documents present and verified: yes     Test results available: yes     Imaging studies available: yes     Immediately prior to procedure, a time out was called: yes     Patient identity confirmed:  Verbally with patient and arm band Indications:    Procedure performed:   Dislocation reduction   Procedure necessitating sedation performed by:  Physician performing sedation Pre-sedation assessment:    Time since last food or drink:  8   ASA classification: class 2 - patient with mild systemic disease     Mouth opening:  3 or more finger widths   Thyromental distance:  4 finger widths   Mallampati score:  I - soft palate, uvula, fauces, pillars visible   Neck mobility: normal     Pre-sedation assessments completed and reviewed: airway patency, cardiovascular function, hydration status, mental status, nausea/vomiting, pain level, respiratory function and temperature   A pre-sedation assessment was completed prior to the start of the procedure Immediate pre-procedure details:    Reassessment: Patient reassessed immediately prior to procedure     Reviewed: vital signs and relevant labs/tests     Verified: bag valve mask available, emergency equipment available, intubation equipment available, IV patency confirmed, oxygen available and reversal medications available   Procedure details (see MAR for exact dosages):    Preoxygenation:  Nasal cannula   Sedation:  Propofol    Intended level of sedation: deep and moderate (conscious sedation)   Intra-procedure monitoring:  Cardiac monitor, blood pressure monitoring, continuous capnometry, continuous pulse oximetry, frequent vital sign checks and frequent LOC assessments   Total Provider sedation time (minutes):  30 Post-procedure details:   A post-sedation assessment was completed following the completion of the procedure.   Attendance: Constant attendance by certified staff until patient recovered     Recovery: Patient returned to pre-procedure baseline     Post-sedation  assessments completed and reviewed: airway patency, cardiovascular function, hydration status, mental status, nausea/vomiting, pain level, respiratory function and temperature     Patient is stable for discharge or admission: yes     Procedure  completion:  Tolerated well, no immediate complications     Lowery Rue, DO 08/21/23 0454

## 2023-08-21 NOTE — Consult Note (Signed)
 Reason for Consult:Left hip dislocation Referring Physician: Lowery Rue Time called: 0900 Time at bedside: 0901   Sydney [name] is an 89 y.o. female.  HPI: Sydney [name] was bending down and felt her left hip dislocate. She was brought to the ED where x-rays confirmed the dx. She was sedated and attempt was made to reduce by EDP that was unsuccessful. Orthopedic surgery was consulted mid-sedation for assistance. Emergency consent obtained as pt sedated and no family around. Hip was able to be relocated.  Past Medical History:  Diagnosis Date   GERD (gastroesophageal reflux disease)    Hx of lumbosacral spine surgery t   Hyperlipidemia    Hypertension    Iliac artery aneurysm (HCC)    Lumbar spinal stenosis    Polymyalgia rheumatica (HCC) 06/2016    Past Surgical History:  Procedure Laterality Date   JOINT REPLACEMENT  1992   right hip replacement   JOINT REPLACEMENT  2002   left hip replacement   ORIF ORBITAL FRACTURE Right 08/24/2020   Procedure: open reduction internal fixation of right orbital floor fracture;  Surgeon: Barb Bonito, MD;  Location: [facility] OR;  Service: Plastics;  Laterality: Right;   ROTATOR CUFF REPAIR  2002   Left shoulder   SPINE SURGERY  2006 & 2010   posterior spinal fusion L4-5 L5S1    Family History  Problem Relation Age of Onset   Heart disease Mother    Heart disease Father        Heart Disease before age 12   Heart attack Father    Hyperlipidemia Father    Heart disease Sister    Diabetes Sister    Heart disease Brother    Hyperlipidemia Brother    Heart disease Brother    Cancer Brother     Social History:  reports that she has never smoked. She has been exposed to tobacco smoke. She has never used smokeless tobacco. She reports that she does not drink alcohol and does not use drugs.  Allergies:  Allergies  Allergen Reactions   Lipitor [Atorvastatin ]     Dizzy ( fuzziness)   Latex Rash    Medications: I have reviewed the patient's  current medications.  No results found for this or any previous visit (from the past 48 hours).  DG Pelvis Portable Result Date: 08/21/2023 CLINICAL DATA:  dislocation.  Hip pain. EXAM: PORTABLE PELVIS 1-2 VIEWS COMPARISON:  Earlier the same day, at 9:09 a.m. FINDINGS: Redemonstration of left hip dislocation. Old healed left inferior pubic ramus fracture noted. There is lower lumbar spinal fixation hardware. Stable alignment of right hip joint. No radiopaque foreign bodies. IMPRESSION: *Redemonstration of left hip dislocation. Electronically Signed   By: Beula Brunswick M.D.   On: 08/21/2023 09:34   DG Pelvis Portable Result Date: 08/21/2023 CLINICAL DATA:  dislocation.  Hip pain. EXAM: PORTABLE PELVIS 1-2 VIEWS COMPARISON:  Radiograph from earlier the same day at 9:11 a.m. FINDINGS: There is reduction of left hip joint when compared to the recent prior exam. Old healed left inferior pubic ramus fracture seen. Stable alignment of right hip joint. No radiopaque foreign bodies. IMPRESSION: *Reduction of left hip joint. Electronically Signed   By: Beula Brunswick M.D.   On: 08/21/2023 09:32   DG Ankle 2 Views Left Result Date: 08/21/2023 CLINICAL DATA:  r/o fx.  Ankle pain. EXAM: LEFT ANKLE - 2 VIEW COMPARISON:  None Available. FINDINGS: There is diffuse osteopenia of the visualized osseous structures. No acute fracture or dislocation. No  aggressive osseous lesion. Ankle mortise appears intact. Calcaneal spur noted along the Plantar aponeurosis attachment site. No focal soft tissue swelling. No radiopaque foreign bodies. IMPRESSION: *No acute osseous abnormality of the left ankle joint. Electronically Signed   By: Beula Brunswick M.D.   On: 08/21/2023 09:30   DG Hip Port Unilat W or Wo Pelvis 1 View Left Result Date: 08/21/2023 CLINICAL DATA:  dislocated EXAM: DG HIP (WITH OR WITHOUT PELVIS) 1V PORT LEFT COMPARISON:  Radiograph from earlier the same day and 08:19 A.M and from 03/06/2022. FINDINGS:  Redemonstration of dislocation of left hip joint with superior and lateral migration of the left femoral head prosthesis in relation to the acetabular prosthesis. No other the acute fracture or dislocation. Old healed left inferior pubic ramus fracture seen. No aggressive osseous lesion. Lower lumbar spinal fixation hardware noted. Visualized sacral arcuate lines are unremarkable. Right hip arthroplasty noted which appears stable in alignment. No radiopaque foreign bodies. IMPRESSION: *Redemonstration of dislocation of left hip joint with superior and lateral migration of the left femoral head prosthesis in relation to the acetabular prosthesis. Electronically Signed   By: Beula Brunswick M.D.   On: 08/21/2023 09:28   DG Hip Unilat W or Wo Pelvis 2-3 Views Left Result Date: 08/21/2023 CLINICAL DATA:  Left hip pain, popping injury EXAM: DG HIP (WITH OR WITHOUT PELVIS) 2-3V LEFT COMPARISON:  03/06/2022 FINDINGS: There is dislocation of the femoral component of the left total hip prosthesis from the acetabular shell component, with the ball of the femoral head component centered about 2.4 cm cephalad and lateral to its expected location. No acute periprosthetic fracture identified. There is evidence of old left pubic ramus fractures as well as bony demineralization and lower lumbar fusion. On the right side, the femoral head component of the prosthesis once again is slightly cephalad to its expected location along the acetabular shell, probably attributable to polyethylene wear. IMPRESSION: 1. Dislocation of the femoral component of the left total hip prosthesis from the acetabular shell component, with the ball of the femoral head component centered about 2.4 cm cephalad and lateral to its expected location. 2. Old left pubic ramus fractures. 3. Suspected polyethylene wear associated with the right total hip prosthesis. 4. Bony demineralization. 5. Lower lumbar fusion. Electronically Signed   By: Freida Jes  M.D.   On: 08/21/2023 08:48    Review of Systems  Unable to perform ROS: Other   Blood pressure (!) 143/93, pulse 96, temperature 98.7 F (37.1 C), temperature source Oral, resp. rate (!) 21, height 5' 4.5" (1.638 m), weight 76.2 kg, SpO2 98%. Physical Exam Constitutional:      General: She is not in acute distress.    Appearance: She is well-developed. She is not diaphoretic.     Comments: Sedated  HENT:     Head: Normocephalic and atraumatic.  Eyes:     General:        Right eye: No discharge.        Left eye: No discharge.  Cardiovascular:     Rate and Rhythm: Normal rate and regular rhythm.  Pulmonary:     Effort: Pulmonary effort is normal. No respiratory distress.  Musculoskeletal:     Comments: LLE No traumatic wounds, ecchymosis, or rash             Leg short and internally rotated  No knee or ankle effusion  Knee stable to varus/ valgus and anterior/posterior stress  Sens DPN, SPN, TN could not assess  Motor  EHL, ext, flex, evers could not assess  DP 2+, PT 2+, No significant edema  Skin:    General: Skin is warm and dry.  Psychiatric:     Comments: Sedated     Assessment/Plan: Left hip dislocation -- KI and post hip precautions. F/u with Dr. Pryor Browning next week. Reportedly she has dislocated many times and may need revision.    Georganna Kin, PA-C Orthopedic Surgery [phone] 08/21/2023, 9:40 AM

## 2023-08-21 NOTE — ED Provider Notes (Signed)
 [place] EMERGENCY DEPARTMENT AT [facility] Provider Note   CSN: [id number] Arrival date & time: 08/21/23  1914     History Chief Complaint  Patient presents with   Hip Pain    Sydney [name] is a 89 y.o. female.  Patient with past history significant for polymyalgia rheumatica, stage IIIa chronic kidney disease, iliac artery aneurysm of the right side presents the emergency department today with concerns of hip pain.  She reports that she was at home and she bent over to pick her phone up off the ground and she turned causing her left hip to feel like it dislocated.  She states this happened to her before with minimal movement.  History of bilateral hip replacement that she estimates about 10 years ago here locally.  She uses a walker to ambulate at baseline.  Unable to bear weight at this time due to pain in her left hip.  Denies any specific fall or impact to the right side of her hip.   Hip Pain       Home Medications Prior to Admission medications   Medication Sig Start Date End Date Taking? Authorizing Provider  acetaminophen  (TYLENOL ) 325 MG tablet Take 325 mg by mouth daily as needed for mild pain (pain score 1-3) or moderate pain (pain score 4-6).   Yes [provider]  pantoprazole  (PROTONIX ) 40 MG tablet TAKE 1 TABLET BY MOUTH EVERY DAY Patient not taking: Reported on 08/21/2023 05/26/23   Helaine Llanos, MD      Allergies    Lipitor [atorvastatin ] and Latex    Review of Systems   Review of Systems  Musculoskeletal:        Hip pain  All other systems reviewed and are negative.   Physical Exam Updated Vital Signs BP (!) 128/98 (BP Location: Left Arm)   Pulse 98   Temp 98.4 F (36.9 C) (Oral)   Resp 16   Ht 5' 4.5" (1.638 m)   Wt 76.2 kg   SpO2 100%   BMI 28.39 kg/m  Physical Exam Vitals and nursing note reviewed.  Constitutional:      General: She is not in acute distress.    Appearance: She is well-developed.  HENT:     Head:  Normocephalic and atraumatic.  Eyes:     Conjunctiva/sclera: Conjunctivae normal.  Cardiovascular:     Rate and Rhythm: Normal rate and regular rhythm.     Heart sounds: No murmur heard. Pulmonary:     Effort: Pulmonary effort is normal. No respiratory distress.     Breath sounds: Normal breath sounds.  Abdominal:     Palpations: Abdomen is soft.     Tenderness: There is no abdominal tenderness.  Musculoskeletal:        General: Tenderness, deformity and signs of injury present. No swelling.     Cervical back: Neck supple.     Comments: Patient holds the left leg in internal rotation with some point tenderness overlying the lateral aspect of the left hip.  No obvious bruising or skin tenting seen.  Skin:    General: Skin is warm and dry.     Capillary Refill: Capillary refill takes less than 2 seconds.  Neurological:     Mental Status: She is alert.  Psychiatric:        Mood and Affect: Mood normal.     ED Results / Procedures / Treatments   Labs (all labs ordered are listed, but only abnormal results are displayed)  Labs Reviewed  URINALYSIS, ROUTINE W REFLEX MICROSCOPIC - Abnormal; Notable for the following components:      Result Value   APPearance HAZY (*)    Leukocytes,Ua TRACE (*)    All other components within normal limits  URINE CULTURE    EKG None  Radiology DG Pelvis Portable Result Date: 08/21/2023 CLINICAL DATA:  dislocation.  Hip pain. EXAM: PORTABLE PELVIS 1-2 VIEWS COMPARISON:  Earlier the same day, at 9:09 a.m. FINDINGS: Redemonstration of left hip dislocation. Old healed left inferior pubic ramus fracture noted. There is lower lumbar spinal fixation hardware. Stable alignment of right hip joint. No radiopaque foreign bodies. IMPRESSION: *Redemonstration of left hip dislocation. Electronically Signed   By: Beula Brunswick M.D.   On: 08/21/2023 09:34   DG Pelvis Portable Result Date: 08/21/2023 CLINICAL DATA:  dislocation.  Hip pain. EXAM: PORTABLE PELVIS 1-2  VIEWS COMPARISON:  Radiograph from earlier the same day at 9:11 a.m. FINDINGS: There is reduction of left hip joint when compared to the recent prior exam. Old healed left inferior pubic ramus fracture seen. Stable alignment of right hip joint. No radiopaque foreign bodies. IMPRESSION: *Reduction of left hip joint. Electronically Signed   By: Beula Brunswick M.D.   On: 08/21/2023 09:32   DG Ankle 2 Views Left Result Date: 08/21/2023 CLINICAL DATA:  r/o fx.  Ankle pain. EXAM: LEFT ANKLE - 2 VIEW COMPARISON:  None Available. FINDINGS: There is diffuse osteopenia of the visualized osseous structures. No acute fracture or dislocation. No aggressive osseous lesion. Ankle mortise appears intact. Calcaneal spur noted along the Plantar aponeurosis attachment site. No focal soft tissue swelling. No radiopaque foreign bodies. IMPRESSION: *No acute osseous abnormality of the left ankle joint. Electronically Signed   By: Beula Brunswick M.D.   On: 08/21/2023 09:30   DG Hip Port Unilat W or Wo Pelvis 1 View Left Result Date: 08/21/2023 CLINICAL DATA:  dislocated EXAM: DG HIP (WITH OR WITHOUT PELVIS) 1V PORT LEFT COMPARISON:  Radiograph from earlier the same day and 08:19 A.M and from 03/06/2022. FINDINGS: Redemonstration of dislocation of left hip joint with superior and lateral migration of the left femoral head prosthesis in relation to the acetabular prosthesis. No other the acute fracture or dislocation. Old healed left inferior pubic ramus fracture seen. No aggressive osseous lesion. Lower lumbar spinal fixation hardware noted. Visualized sacral arcuate lines are unremarkable. Right hip arthroplasty noted which appears stable in alignment. No radiopaque foreign bodies. IMPRESSION: *Redemonstration of dislocation of left hip joint with superior and lateral migration of the left femoral head prosthesis in relation to the acetabular prosthesis. Electronically Signed   By: Beula Brunswick M.D.   On: 08/21/2023 09:28   DG Hip  Unilat W or Wo Pelvis 2-3 Views Left Result Date: 08/21/2023 CLINICAL DATA:  Left hip pain, popping injury EXAM: DG HIP (WITH OR WITHOUT PELVIS) 2-3V LEFT COMPARISON:  03/06/2022 FINDINGS: There is dislocation of the femoral component of the left total hip prosthesis from the acetabular shell component, with the ball of the femoral head component centered about 2.4 cm cephalad and lateral to its expected location. No acute periprosthetic fracture identified. There is evidence of old left pubic ramus fractures as well as bony demineralization and lower lumbar fusion. On the right side, the femoral head component of the prosthesis once again is slightly cephalad to its expected location along the acetabular shell, probably attributable to polyethylene wear. IMPRESSION: 1. Dislocation of the femoral component of the left total hip prosthesis from  the acetabular shell component, with the ball of the femoral head component centered about 2.4 cm cephalad and lateral to its expected location. 2. Old left pubic ramus fractures. 3. Suspected polyethylene wear associated with the right total hip prosthesis. 4. Bony demineralization. 5. Lower lumbar fusion. Electronically Signed   By: Freida Jes M.D.   On: 08/21/2023 08:48    Procedures .Reduction of dislocation  Date/Time: 08/21/2023 11:40 AM  Performed by: Deondrick Searls A, PA-C Authorized by: Chyane Greer A, PA-C  Consent: Verbal consent obtained. Written consent obtained. Risks and benefits: risks, benefits and alternatives were discussed Consent given by: patient Patient understanding: patient states understanding of the procedure being performed Patient consent: the patient's understanding of the procedure matches consent given Procedure consent: procedure consent matches procedure scheduled Relevant documents: relevant documents present and verified Test results: test results available and properly labeled Imaging studies: imaging studies  available Patient identity confirmed: verbally with patient and arm band Time out: Immediately prior to procedure a "time out" was called to verify the correct patient, procedure, equipment, support staff and site/side marked as required. Local anesthesia used: no  Anesthesia: Local anesthesia used: no Patient tolerance: patient tolerated the procedure well with no immediate complications Comments: Initial reduction attempts performed by myself were unsuccessful.       Medications Ordered in ED Medications  fentaNYL  (SUBLIMAZE ) injection 50 mcg (50 mcg Intravenous Given 08/21/23 0751)  propofol  (DIPRIVAN ) 10 mg/mL bolus/IV push 38.1 mg (40 mg Intravenous Given 08/21/23 0850)  sodium chloride  0.9 % bolus 500 mL (0 mLs Intravenous Stopped 08/21/23 0920)  ondansetron  (ZOFRAN ) injection 4 mg (4 mg Intravenous Given 08/21/23 0751)  propofol  (DIPRIVAN ) 10 mg/mL bolus/IV push (50 mg Intravenous Given 08/21/23 0905)  propofol  (DIPRIVAN ) 10 mg/mL bolus/IV push (75 mg Intravenous Given 08/21/23 0912)    ED Course/ Medical Decision Making/ A&P                                Medical Decision Making Amount and/or Complexity of Data Reviewed Labs: ordered. Radiology: ordered.  Risk Prescription drug management.   This patient presents to the ED for concern of hip pain.  Differential diagnosis includes hip dislocation, femur fracture, open pelvic fracture  Lab Tests:  I Ordered, and personally interpreted labs.  The pertinent results include: UA with trace leukocytes but no obvious signs of infection, urine culture collected and pending   Imaging Studies ordered:  I ordered imaging studies including x-ray of the hip/left pelvis, left ankle x-ray, portable pelvis x-ray I independently visualized and interpreted imaging which showed Dislocation of the femoral component of the left total hip prosthesis from the acetabular shell component, with the ball of the femoral head component centered about  2.4 cm cephalad and lateral to its expected location. 2. Old left pubic ramus fractures. 3. Suspected polyethylene wear associated with the right total hip prosthesis. 4. Bony demineralization. 5. Lower lumbar fusion.  Left ankle x-ray unremarkable, possible pelvic x-ray shows successful reduction I agree with the radiologist interpretation   Medicines ordered and prescription drug management:  I ordered medication including fentanyl , propofol , fluids, Zofran  for pain, sedation, hydration support, nausea Reevaluation of the patient after these medicines showed that the patient improved I have reviewed the patients home medicines and have made adjustments as needed   Problem List / ED Course:  Patient presents the emergency department today with concerns of hip pain.  She has a  history of hip replacement and multiple hip dislocations primarily on the left side.  She reports that she was try to pick up her phone off the ground when she felt her left side give out.  States this is typically how this presents.  Difficulty bearing weight at this time.  Denies any feelings of numbness or tingling in the left lower extremity.  Right leg unremarkable. Physical exam reveals an internally rotated left hip.  No obvious trauma seen with no skin tenting or bruising present.  She is neurovascularly intact.  Right leg unremarkable.  Will obtain x-rays for assessment of suspected hip dislocation. Hip x-ray shows dislocation of the left hip. Patient with difficult reduction. Called Starr Eddy, orthopedics PA, who assisted with reduction. Post-reduction xray confirms hip is reduced. We appreciate his help getting this hip reduced. Patient neurovascularly intact before and after reduction. Plan to set up outpatient follow up with orthopedics as her surgeon who had performed hip replacement no longer in practice locally. Patient agreeable with this plan. Patient doing well following reduction.  Urinalysis  unremarkable for signs of infection.  Send off urine culture for further assessment.  Advised patient if results were to show any concern for infection, she would be contacted to likely start antibiotics.  I feel that she is otherwise stable at this time for outpatient follow-up and discharged home.  Final Clinical Impression(s) / ED Diagnoses Final diagnoses:  Dislocation of left hip, initial encounter [facility])    Rx / DC Orders ED Discharge Orders     None         Concetta Dee, PA-C 08/21/23 1403    Lowery Rue, DO 08/21/23 1451

## 2023-08-21 NOTE — ED Triage Notes (Addendum)
 Patient presents to ed via [facility] states she bend over to pick up her phone turned the wrong way and her left hip went out. History of bilateral hip replacement and states she has had several dislocations since. Uses walker to walk

## 2023-08-21 NOTE — ED Notes (Signed)
 Patient transported to X-ray

## 2023-08-21 NOTE — Procedures (Signed)
 Procedure: Left hip closed reduction   Indication: Left hip dislocation   Surgeon: Starr Eddy, PA-C   Assist: Lowery Rue, MD   Anesthesia: Propofol  via EDP   EBL: None   Complications: None   Findings: After risks/benefits explained patient desires to undergo procedure by EDP. Orthopedics consulted mid-sedation so emergency consent was assumed. The left hip was relocated with a great amount of difficulty. Pt tolerated the procedure well.       Georganna Kin, PA-C Orthopedic Surgery [phone]

## 2023-08-21 NOTE — Discharge Instructions (Addendum)
 You are seen in the emergency department today for concerns of hip pain.  Your left hip was dislocated and was reduced here in the emergency department.  We had the orthopedics team with their PA assess you and he recommends having follow-up with an orthopedic surgeon for further evaluation given your history of frequent hip dislocations.

## 2023-08-21 NOTE — Progress Notes (Signed)
 Orthopedic Tech Progress Note Patient Details:  Sydney [name] 16-Jun-1932 [id number]  Ortho Devices Type of Ortho Device: Knee Immobilizer Ortho Device/Splint Location: LLE Ortho Device/Splint Interventions: Ordered, Application, Adjustment   Post Interventions Patient Tolerated: Well  Kumar Falwell OTR/L 08/21/2023, 10:10 AM

## 2023-08-23 ENCOUNTER — Other Ambulatory Visit: Payer: Self-pay | Admitting: Internal Medicine

## 2023-08-25 LAB — URINE CULTURE
Culture: 20000 — AB
Special Requests: NORMAL

## 2023-08-26 ENCOUNTER — Telehealth (HOSPITAL_BASED_OUTPATIENT_CLINIC_OR_DEPARTMENT_OTHER): Payer: Self-pay | Admitting: *Deleted

## 2023-08-26 NOTE — Telephone Encounter (Signed)
 Post ED Visit - Positive Culture Follow-up  Culture report reviewed by antimicrobial stewardship pharmacist: [facility] [x]  Joycelyn Noa, Pharm.D. []  Skeet Duke, Pharm.D., BCPS AQ-ID []  Leslee Rase, Pharm.D., BCPS []  Garland Junk, [address].D., BCPS []  [place], [address].D., BCPS, AAHIVP []  Alcide Aly, Pharm.D., BCPS, AAHIVP []  Jerri Morale, PharmD, BCPS []  Graham Laws, PharmD, BCPS []  Cleda Curly, PharmD, BCPS []  Tamar Fairly, PharmD []  Ballard Levels, PharmD, BCPS []  Ollen Beverage, PharmD  [facility] []  Arlyne Bering, PharmD []  Sherryle Don, PharmD []  Van Gelinas, PharmD []  Delila Felty, Rph []  [place]) Cleora Daft, PharmD []  Augustina Block, PharmD []  Arie Kurtz, PharmD []  Sharlyn Deaner, PharmD []  Agnes Hose, PharmD []  Kendall Pauls, PharmD []  Gladstone Lamer, PharmD []  Armanda Bern, PharmD []  Tera Fellows, PharmD   Positive urine culture No urinary symptoms reported UA without bacteria 6-10 WBC, no leukocytes. No further patient follow-up is required at this time.  Jessee Mormon 08/26/2023, 9:14 AM

## 2023-09-17 ENCOUNTER — Telehealth: Payer: Self-pay | Admitting: Internal Medicine

## 2023-09-17 ENCOUNTER — Ambulatory Visit: Payer: Self-pay

## 2023-09-17 NOTE — Telephone Encounter (Signed)
 Patient wants a rash that she needs medicine for, states "Sydney [name] knows what it is" declined appointment  Said she was told that she would get a call back in an hour  Please follow up with the patient  Declined triage by [facility]

## 2023-09-17 NOTE — Telephone Encounter (Signed)
 I called and spoke to pt. She said she has a rash from the outside of the vaginal opening to the rectum. States she has had it in the past and thought she had a cream for it. Said ti is not a yeast as it has been tested before. I looked in her medication list and medication history and only found Silvadene  from Dr Malissa Se in 2016 for a burn. She thought that may have been it. I advised I would not be able to send that in because it was so long since she last had it. I suggested she make an OV and found several tomorrow with Dr Cherlyn Cornet. Pt declined. Said she wants to wait for Dr Joelle Musca to return. I advised that was June 16. She said she was fine to wait. Made her an OV 09-29-23. I did suggest she get some A&D ointment or diaper rash cream and try that. She will do that. If it gets worse, she will make a sooner appt.

## 2023-09-17 NOTE — Telephone Encounter (Signed)
  FYI Only or Action Required?: FYI only for provider  Patient was last seen in primary care on 07/01/2023 by Helaine Llanos, MD. Called Nurse Triage reporting No chief complaint on file.. Symptoms began at an unknown onset. Interventions attempted: Nothing. Symptoms are: Unknown.  Triage Disposition: [name] PCP Now  Patient/caregiver understands and will follow disposition?: No, wishes to speak with PCP  Contacted [facility], Spoke with [name], Sydney [name] is to contact patient back, instructed by [name] and myself to be alert for Sydney [name].    Reason for Disposition  [1] Caller requests to speak ONLY to PCP AND [2] URGENT question  Protocols used: PCP [name] - No Triage-A-AH

## 2023-09-17 NOTE — Telephone Encounter (Signed)
 Sydney [name], [name]30 minutes ago (9:32 AM)   AY Patient wants a rash that she needs medicine for, states "Sydney [name] knows what it is" declined appointment   Said she was told that she would get a call back in an hour   Please follow up with the patient   Declined triage by [facility]

## 2023-09-29 ENCOUNTER — Ambulatory Visit: Admitting: Internal Medicine

## 2023-10-09 ENCOUNTER — Telehealth: Payer: Self-pay | Admitting: Internal Medicine

## 2023-10-09 DIAGNOSIS — L989 Disorder of the skin and subcutaneous tissue, unspecified: Secondary | ICD-10-CM

## 2023-10-09 NOTE — Telephone Encounter (Signed)
 Referral placed.

## 2023-10-09 NOTE — Addendum Note (Signed)
 Addended by: JIMMY ADE I on: 10/09/2023 04:09 PM   Modules accepted: Orders

## 2023-10-09 NOTE — Telephone Encounter (Signed)
  Copied from CRM [id number]. Topic: Referral - Request for Referral >> Oct 09, 2023 12:44 PM Sydney [name] wrote: Did the patient discuss referral with their provider in the last year? Yes (If No - schedule appointment) (If Yes - send message)  Appointment offered? Yes  Type of order/referral and detailed reason for visit: Dermatologist   Preference of office, provider, location: [place] or [facility]  If referral order, have you been seen by this specialty before? No (If Yes, this issue or another issue? When? Where?  Can we respond through MyChart? No

## 2023-11-12 ENCOUNTER — Encounter: Payer: Self-pay | Admitting: Internal Medicine

## 2024-01-13 ENCOUNTER — Inpatient Hospital Stay (HOSPITAL_COMMUNITY)
Admission: EM | Admit: 2024-01-13 | Discharge: 2024-01-15 | DRG: 280 | Disposition: A | Discharge status: Home Health | Attending: Internal Medicine | Admitting: Internal Medicine

## 2024-01-13 ENCOUNTER — Emergency Department (HOSPITAL_COMMUNITY)

## 2024-01-13 ENCOUNTER — Encounter (HOSPITAL_COMMUNITY): Payer: Self-pay | Admitting: Emergency Medicine

## 2024-01-13 ENCOUNTER — Inpatient Hospital Stay (HOSPITAL_COMMUNITY)

## 2024-01-13 ENCOUNTER — Other Ambulatory Visit: Payer: Self-pay

## 2024-01-13 DIAGNOSIS — Z8249 Family history of ischemic heart disease and other diseases of the circulatory system: Secondary | ICD-10-CM

## 2024-01-13 DIAGNOSIS — J210 Acute bronchiolitis due to respiratory syncytial virus: Secondary | ICD-10-CM | POA: Diagnosis present

## 2024-01-13 DIAGNOSIS — K219 Gastro-esophageal reflux disease without esophagitis: Secondary | ICD-10-CM | POA: Diagnosis present

## 2024-01-13 DIAGNOSIS — I110 Hypertensive heart disease with heart failure: Secondary | ICD-10-CM | POA: Diagnosis present

## 2024-01-13 DIAGNOSIS — E872 Acidosis, unspecified: Secondary | ICD-10-CM | POA: Diagnosis present

## 2024-01-13 DIAGNOSIS — B338 Other specified viral diseases: Secondary | ICD-10-CM | POA: Diagnosis not present

## 2024-01-13 DIAGNOSIS — Z91040 Latex allergy status: Secondary | ICD-10-CM

## 2024-01-13 DIAGNOSIS — I5021 Acute systolic (congestive) heart failure: Secondary | ICD-10-CM | POA: Diagnosis present

## 2024-01-13 DIAGNOSIS — Z981 Arthrodesis status: Secondary | ICD-10-CM

## 2024-01-13 DIAGNOSIS — I10 Essential (primary) hypertension: Secondary | ICD-10-CM | POA: Diagnosis present

## 2024-01-13 DIAGNOSIS — Z83438 Family history of other disorder of lipoprotein metabolism and other lipidemia: Secondary | ICD-10-CM

## 2024-01-13 DIAGNOSIS — J121 Respiratory syncytial virus pneumonia: Secondary | ICD-10-CM | POA: Diagnosis present

## 2024-01-13 DIAGNOSIS — Z66 Do not resuscitate: Secondary | ICD-10-CM | POA: Diagnosis present

## 2024-01-13 DIAGNOSIS — I480 Paroxysmal atrial fibrillation: Secondary | ICD-10-CM | POA: Diagnosis present

## 2024-01-13 DIAGNOSIS — M353 Polymyalgia rheumatica: Secondary | ICD-10-CM | POA: Diagnosis present

## 2024-01-13 DIAGNOSIS — R911 Solitary pulmonary nodule: Secondary | ICD-10-CM | POA: Diagnosis present

## 2024-01-13 DIAGNOSIS — I214 Non-ST elevation (NSTEMI) myocardial infarction: Secondary | ICD-10-CM | POA: Diagnosis present

## 2024-01-13 DIAGNOSIS — Z833 Family history of diabetes mellitus: Secondary | ICD-10-CM

## 2024-01-13 DIAGNOSIS — Z1152 Encounter for screening for COVID-19: Secondary | ICD-10-CM | POA: Diagnosis not present

## 2024-01-13 DIAGNOSIS — I255 Ischemic cardiomyopathy: Secondary | ICD-10-CM | POA: Diagnosis present

## 2024-01-13 DIAGNOSIS — Z8679 Personal history of other diseases of the circulatory system: Secondary | ICD-10-CM

## 2024-01-13 DIAGNOSIS — I509 Heart failure, unspecified: Secondary | ICD-10-CM

## 2024-01-13 DIAGNOSIS — I420 Dilated cardiomyopathy: Secondary | ICD-10-CM | POA: Diagnosis present

## 2024-01-13 DIAGNOSIS — E785 Hyperlipidemia, unspecified: Secondary | ICD-10-CM | POA: Diagnosis present

## 2024-01-13 DIAGNOSIS — R7989 Other specified abnormal findings of blood chemistry: Secondary | ICD-10-CM | POA: Diagnosis not present

## 2024-01-13 DIAGNOSIS — I4891 Unspecified atrial fibrillation: Secondary | ICD-10-CM | POA: Diagnosis not present

## 2024-01-13 DIAGNOSIS — Z888 Allergy status to other drugs, medicaments and biological substances status: Secondary | ICD-10-CM | POA: Diagnosis not present

## 2024-01-13 DIAGNOSIS — J129 Viral pneumonia, unspecified: Secondary | ICD-10-CM | POA: Diagnosis not present

## 2024-01-13 DIAGNOSIS — B3322 Viral myocarditis: Secondary | ICD-10-CM | POA: Diagnosis present

## 2024-01-13 DIAGNOSIS — Z602 Problems related to living alone: Secondary | ICD-10-CM | POA: Diagnosis present

## 2024-01-13 DIAGNOSIS — Z96643 Presence of artificial hip joint, bilateral: Secondary | ICD-10-CM | POA: Diagnosis present

## 2024-01-13 DIAGNOSIS — J9601 Acute respiratory failure with hypoxia: Secondary | ICD-10-CM | POA: Diagnosis present

## 2024-01-13 DIAGNOSIS — I714 Abdominal aortic aneurysm, without rupture, unspecified: Secondary | ICD-10-CM | POA: Diagnosis present

## 2024-01-13 DIAGNOSIS — I728 Aneurysm of other specified arteries: Secondary | ICD-10-CM | POA: Diagnosis present

## 2024-01-13 LAB — CBC WITH DIFFERENTIAL/PLATELET
Abs Immature Granulocytes: 0.02 K/uL (ref 0.00–0.07)
Basophils Absolute: 0.1 K/uL (ref 0.0–0.1)
Basophils Relative: 1 %
Eosinophils Absolute: 0.1 K/uL (ref 0.0–0.5)
Eosinophils Relative: 1 %
HCT: 48.3 % — ABNORMAL HIGH (ref 36.0–46.0)
Hemoglobin: 15.6 g/dL — ABNORMAL HIGH (ref 12.0–15.0)
Immature Granulocytes: 0 %
Lymphocytes Relative: 15 %
Lymphs Abs: 1.2 K/uL (ref 0.7–4.0)
MCH: 30.5 pg (ref 26.0–34.0)
MCHC: 32.3 g/dL (ref 30.0–36.0)
MCV: 94.3 fL (ref 80.0–100.0)
Monocytes Absolute: 0.4 K/uL (ref 0.1–1.0)
Monocytes Relative: 5 %
Neutro Abs: 6.1 K/uL (ref 1.7–7.7)
Neutrophils Relative %: 78 %
Platelets: 196 K/uL (ref 150–400)
RBC: 5.12 MIL/uL — ABNORMAL HIGH (ref 3.87–5.11)
RDW: 13.2 % (ref 11.5–15.5)
WBC: 7.9 K/uL (ref 4.0–10.5)
nRBC: 0 % (ref 0.0–0.2)

## 2024-01-13 LAB — I-STAT VENOUS BLOOD GAS, ED
Acid-base deficit: 6 mmol/L — ABNORMAL HIGH (ref 0.0–2.0)
Bicarbonate: 20.3 mmol/L (ref 20.0–28.0)
Calcium, Ion: 1.24 mmol/L (ref 1.15–1.40)
HCT: 47 % — ABNORMAL HIGH (ref 36.0–46.0)
Hemoglobin: 16 g/dL — ABNORMAL HIGH (ref 12.0–15.0)
O2 Saturation: 99 %
Potassium: 3.9 mmol/L (ref 3.5–5.1)
Sodium: 140 mmol/L (ref 135–145)
TCO2: 22 mmol/L (ref 22–32)
pCO2, Ven: 40.1 mmHg — ABNORMAL LOW (ref 44–60)
pH, Ven: 7.313 (ref 7.25–7.43)
pO2, Ven: 145 mmHg — ABNORMAL HIGH (ref 32–45)

## 2024-01-13 LAB — ECHOCARDIOGRAM COMPLETE
AR max vel: 2.29 cm2
AV Area VTI: 2.2 cm2
AV Area mean vel: 1.91 cm2
AV Mean grad: 5 mmHg
AV Peak grad: 9.2 mmHg
Ao pk vel: 1.52 m/s
Area-P 1/2: 3.95 cm2
Calc EF: 22.8 %
Height: 66 in
MV M vel: 5.9 m/s
MV Peak grad: 139.2 mmHg
MV VTI: 2.06 cm2
P 1/2 time: 764 ms
Radius: 0.7 cm
S' Lateral: 5.1 cm
Single Plane A2C EF: 24.1 %
Single Plane A4C EF: 22.8 %
Weight: 2480 [oz_av]

## 2024-01-13 LAB — MAGNESIUM: Magnesium: 1.8 mg/dL (ref 1.7–2.4)

## 2024-01-13 LAB — I-STAT CG4 LACTIC ACID, ED
Lactic Acid, Venous: 3.1 mmol/L (ref 0.5–1.9)
Lactic Acid, Venous: 2.1 mmol/L (ref 0.5–1.9)

## 2024-01-13 LAB — URINALYSIS, ROUTINE W REFLEX MICROSCOPIC
Bilirubin Urine: NEGATIVE
Glucose, UA: NEGATIVE mg/dL
Hgb urine dipstick: NEGATIVE
Ketones, ur: NEGATIVE mg/dL
Nitrite: NEGATIVE
Protein, ur: NEGATIVE mg/dL
Specific Gravity, Urine: 1.01 (ref 1.005–1.030)
pH: 5 (ref 5.0–8.0)

## 2024-01-13 LAB — COMPREHENSIVE METABOLIC PANEL WITH GFR
ALT: 9 U/L (ref 0–44)
AST: 20 U/L (ref 15–41)
Albumin: 3.3 g/dL — ABNORMAL LOW (ref 3.5–5.0)
Alkaline Phosphatase: 68 U/L (ref 38–126)
Anion gap: 14 (ref 5–15)
BUN: 12 mg/dL (ref 8–23)
CO2: 18 mmol/L — ABNORMAL LOW (ref 22–32)
Calcium: 9.5 mg/dL (ref 8.9–10.3)
Chloride: 105 mmol/L (ref 98–111)
Creatinine, Ser: 1.14 mg/dL — ABNORMAL HIGH (ref 0.44–1.00)
GFR, Estimated: 46 mL/min — ABNORMAL LOW (ref >60)
Glucose, Bld: 221 mg/dL — ABNORMAL HIGH (ref 70–99)
Potassium: 3.7 mmol/L (ref 3.5–5.1)
Sodium: 137 mmol/L (ref 135–145)
Total Bilirubin: 0.7 mg/dL (ref 0.0–1.2)
Total Protein: 6.6 g/dL (ref 6.5–8.1)

## 2024-01-13 LAB — HEPARIN LEVEL (UNFRACTIONATED): Heparin Unfractionated: 0.22 [IU]/mL — ABNORMAL LOW (ref 0.30–0.70)

## 2024-01-13 LAB — TROPONIN I (HIGH SENSITIVITY)
Troponin I (High Sensitivity): 158 ng/L (ref <18)
Troponin I (High Sensitivity): 889 ng/L (ref <18)

## 2024-01-13 LAB — TSH: TSH: 1.249 u[IU]/mL (ref 0.350–4.500)

## 2024-01-13 LAB — RESP PANEL BY RT-PCR (RSV, FLU A&B, COVID)  RVPGX2
Influenza A by PCR: NEGATIVE
Influenza B by PCR: NEGATIVE
Resp Syncytial Virus by PCR: POSITIVE — AB
SARS Coronavirus 2 by RT PCR: NEGATIVE

## 2024-01-13 LAB — BRAIN NATRIURETIC PEPTIDE: B Natriuretic Peptide: 1624.1 pg/mL — ABNORMAL HIGH (ref 0.0–100.0)

## 2024-01-13 LAB — PROCALCITONIN: Procalcitonin: <0.1 ng/mL

## 2024-01-13 LAB — C-REACTIVE PROTEIN: CRP: 0.6 mg/dL (ref <1.0)

## 2024-01-13 MED ORDER — GUAIFENESIN ER 600 MG PO TB12
600.0000 mg | ORAL_TABLET | Freq: Two times a day (BID) | ORAL | Status: DC
  Administered 2024-01-13 – 2024-01-15 (×5): 600 mg via ORAL
  Filled 2024-01-13 (×5): qty 1

## 2024-01-13 MED ORDER — LACTATED RINGERS IV BOLUS
500.0000 mL | Freq: Once | INTRAVENOUS | Status: AC
  Administered 2024-01-13: 500 mL via INTRAVENOUS

## 2024-01-13 MED ORDER — HEPARIN (PORCINE) 25000 UT/250ML-% IV SOLN
1150.0000 [IU]/h | INTRAVENOUS | Status: DC
  Administered 2024-01-13: 1000 [IU]/h via INTRAVENOUS
  Administered 2024-01-14 – 2024-01-15 (×2): 1150 [IU]/h via INTRAVENOUS
  Filled 2024-01-13 (×3): qty 250

## 2024-01-13 MED ORDER — DIGOXIN 0.25 MG/ML IJ SOLN
0.2500 mg | Freq: Once | INTRAMUSCULAR | Status: AC
  Administered 2024-01-13: 0.25 mg via INTRAVENOUS
  Filled 2024-01-13: qty 2

## 2024-01-13 MED ORDER — ACETAMINOPHEN 650 MG RE SUPP: 650.0000 mg | Freq: Four times a day (QID) | RECTAL | Status: DC | PRN

## 2024-01-13 MED ORDER — CARVEDILOL 3.125 MG PO TABS
3.1250 mg | ORAL_TABLET | Freq: Two times a day (BID) | ORAL | Status: DC
  Administered 2024-01-13 – 2024-01-14 (×3): 3.125 mg via ORAL
  Filled 2024-01-13 (×3): qty 1

## 2024-01-13 MED ORDER — PERFLUTREN LIPID MICROSPHERE
1.0000 mL | INTRAVENOUS | Status: AC | PRN
  Administered 2024-01-13: 4 mL via INTRAVENOUS

## 2024-01-13 MED ORDER — FUROSEMIDE 10 MG/ML IJ SOLN
40.0000 mg | Freq: Once | INTRAMUSCULAR | Status: AC
  Administered 2024-01-13: 40 mg via INTRAVENOUS
  Filled 2024-01-13: qty 4

## 2024-01-13 MED ORDER — LORAZEPAM 2 MG/ML IJ SOLN: 0.5000 mg | Freq: Once | INTRAMUSCULAR | Status: DC

## 2024-01-13 MED ORDER — LACTATED RINGERS IV SOLN: INTRAVENOUS | Status: DC

## 2024-01-13 MED ORDER — ACETAMINOPHEN 325 MG PO TABS: 650.0000 mg | ORAL_TABLET | Freq: Four times a day (QID) | ORAL | Status: DC | PRN

## 2024-01-13 MED ORDER — HYDRALAZINE HCL 20 MG/ML IJ SOLN: 10.0000 mg | INTRAMUSCULAR | Status: DC | PRN

## 2024-01-13 MED ORDER — IOHEXOL 350 MG/ML SOLN
75.0000 mL | Freq: Once | INTRAVENOUS | Status: AC | PRN
Start: 2024-01-13 — End: 2024-01-13
  Administered 2024-01-13: 75 mL via INTRAVENOUS

## 2024-01-13 MED ORDER — POTASSIUM CHLORIDE ER 10 MEQ PO TBCR
10.0000 meq | EXTENDED_RELEASE_TABLET | Freq: Two times a day (BID) | ORAL | Status: AC
  Administered 2024-01-13 (×2): 10 meq via ORAL
  Filled 2024-01-13 (×4): qty 1

## 2024-01-13 MED ORDER — IPRATROPIUM-ALBUTEROL 0.5-2.5 (3) MG/3ML IN SOLN
3.0000 mL | Freq: Once | RESPIRATORY_TRACT | Status: AC
  Administered 2024-01-13: 3 mL via RESPIRATORY_TRACT
  Filled 2024-01-13: qty 3

## 2024-01-13 MED ORDER — HYDRALAZINE HCL 20 MG/ML IJ SOLN: 5.0000 mg | INTRAMUSCULAR | Status: DC | PRN

## 2024-01-13 MED ORDER — SODIUM CHLORIDE 0.9% FLUSH
3.0000 mL | Freq: Two times a day (BID) | INTRAVENOUS | Status: DC
  Administered 2024-01-13 – 2024-01-15 (×4): 3 mL via INTRAVENOUS

## 2024-01-13 MED ORDER — HEPARIN BOLUS VIA INFUSION
4000.0000 [IU] | Freq: Once | INTRAVENOUS | Status: AC
  Administered 2024-01-13: 4000 [IU] via INTRAVENOUS
  Filled 2024-01-13: qty 4000

## 2024-01-13 MED ORDER — ALBUTEROL SULFATE (2.5 MG/3ML) 0.083% IN NEBU: 2.5000 mg | INHALATION_SOLUTION | RESPIRATORY_TRACT | Status: DC | PRN

## 2024-01-13 NOTE — H&P (Addendum)
 History and Physical    Patient: Sydney [name] FMW:[id number] DOB: [birth year] DOA: 01/13/2024 DOS: the patient was seen and examined on 01/13/2024 PCP: [name], MD  Patient coming from: Home  Chief Complaint: Respiratory distress  HPI: Sydney [name] is a 89 y.o. female with medical history significant of hypertension, hyperlipidemia, polymyalgia rheumatica, GERD, and AAA worsening respiratory symptoms and chest discomfort. She is accompanied by her daughter.  She has been experiencing progressive congestion, leading to episodes of excessive coughing and chest pain. During these episodes, she has difficulty breathing and produces thick, brownish-green sputum. No fever or chills have been noted, and she has not been in contact with anyone who is sick recently.  She feels her heart racing during coughing fits but has not noticed any irregular heartbeats otherwise.  No history of smoking or alcohol use. She has a longstanding do not resuscitate order in place.  No fever, chills, vomiting, diarrhea, and pain aside from the chest/epigastric discomfort associated with coughing.  Daughter makes note of a longstanding history of AAA  In the ED patient was was noted to be afebrile with heart rates elevated up to 129 in atrial fibrillation, respirations 18-29, blood pressures elevated up to 152/109, and O2 saturations currently maintained on 6 L nasal cannula oxygen.  Labs significant for high-sensitivity troponins 158-> 889, lactic acid 3.1-> 2.1, WBC 7.9, hemoglobin 11.6, CO2 18, BUN 12, creatinine 1.14, and glucose 221. chest x-ray noted bilateral interstitial densities concerning for pulmonary edema with compatible small bilateral pleural effusions.  RSV screening was positive.   Review of Systems: As mentioned in the history of present illness. All other systems reviewed and are negative. Past Medical History:  Diagnosis Date   GERD (gastroesophageal reflux disease)    Hx of  lumbosacral spine surgery t   Hyperlipidemia    Hypertension    Iliac artery aneurysm    Lumbar spinal stenosis    Polymyalgia rheumatica 06/2016   Past Surgical History:  Procedure Laterality Date   JOINT REPLACEMENT  1992   right hip replacement   JOINT REPLACEMENT  2002   left hip replacement   ORIF ORBITAL FRACTURE Right 08/24/2020   Procedure: open reduction internal fixation of right orbital floor fracture;  Surgeon: Elisabeth Craig RAMAN, MD;  Location: [facility] OR;  Service: Plastics;  Laterality: Right;   ROTATOR CUFF REPAIR  2002   Left shoulder   SPINE SURGERY  2006 & 2010   posterior spinal fusion L4-5 L5S1   Social History:  reports that she has never smoked. She has been exposed to tobacco smoke. She has never used smokeless tobacco. She reports that she does not drink alcohol and does not use drugs.  Allergies  Allergen Reactions   Lipitor [Atorvastatin ]     Dizzy ( fuzziness)   Latex Rash    Family History  Problem Relation Age of Onset   Heart disease Mother    Heart disease Father        Heart Disease before age 32   Heart attack Father    Hyperlipidemia Father    Heart disease Sister    Diabetes Sister    Heart disease Brother    Hyperlipidemia Brother    Heart disease Brother    Cancer Brother     Prior to Admission medications   Medication Sig Start Date End Date Taking? Authorizing Provider  acetaminophen  (TYLENOL ) 325 MG tablet Take 325 mg by mouth daily as needed for mild pain (pain score 1-3)  or moderate pain (pain score 4-6).    [provider]  pantoprazole  (PROTONIX ) 40 MG tablet TAKE 1 TABLET BY MOUTH EVERY DAY 08/24/23   [name], MD    Physical Exam: Vitals:   01/13/24 0732 01/13/24 0750 01/13/24 0830 01/13/24 1121  BP:   (!) 146/92   Pulse:  60 (!) 104   Resp:  (!) 29 18   Temp:    98 F (36.7 C)  TempSrc:    Temporal  SpO2:  93% 99%   Weight: 70.3 kg     Height: 5' 6 (1.676 m)      Constitutional: Elderly female who  appears ill but able to follow commands Eyes: PERRL, lids and conjunctivae normal ENMT: Mucous membranes are moist.  Normal dentition.  Neck: normal, supple.  Mild JVD appreciated.  Full Respiratory: Rales noted in both lung fields with mild expiratory wheeze present.  O2 saturations currently maintained on 6 L of nasal cannula oxygen. Cardiovascular: Regular rate and rhythm, no murmurs / rubs / gallops. No extremity edema. 2+ pedal pulses.  Abdomen: Mild epigastric tenderness reported.  Bowel sounds positive.  Musculoskeletal: no clubbing / cyanosis. No joint deformity upper and lower extremities. Good ROM, no contractures. Normal muscle tone.  Skin: no rashes, lesions, ulcers. No induration Neurologic: CN 2-12 grossly intact.   Strength 5/5 in all 4.  Psychiatric: Normal judgment and insight. Alert and oriented x 3. Normal mood.   Data Reviewed:  EKG revealed concern for paroxysmal atrial fibrillation 125 bpm.  Reviewed labs, imaging, and pertinent records as documented.  Assessment and Plan:  Acute respiratory failure with hypoxia  Question new onset heart failure RSV infection Patient presents with worsening respiratory distress.  Normally patient not on oxygen at baseline requiring 6 L nasal cannula oxygen to maintain O2 saturations greater than 92%.  On physical exam patient with mild JVD present, but no significant lower extremity swelling appreciated.  Chest x-ray showing concern for pulmonary edema with small bilateral pleural effusions.  Patient found to be positive for RSV infection.  Question possibility of new onset heart failure related with viral infection.  Patient had initially been given a bolus of IV fluids - Admit to a progressive bed - Continuous pulse oximetry with oxygen maintain O2 saturations greater than 90% - Incentive spirometry and flutter valve - Check procalcitonin level, ESR, CRP, and BNP - Check CT angiogram of the chest, abdomen, and pelvis - Check  echocardiogram - Breathing treatments as needed for shortness of breath/wheezing - Lasix  IV x 1 dose.  Reassess in a.m. for need of continued IV diuresis  Elevated troponin Acute.  High-sensitivity troponins 158-> 889.  Patient denies any complaints of chest pain per se.  Question possibility of pericarditis with acute viral infection. - Heparin drip per pharmacy  - Checking echocardiogram - Cardiology consulted, will follow-up for any further recommendations  New onset atrial fibrillation Patient presents in A-fib with heart rates into the 120s. - Goal potassium at least 4 and magnesium at least 2 - Heparin drip per pharmacy - Check TSH - Check echocardiogram  Lactic acidosis Acute.  Initial lactic acid elevated at 3.1 with repeat check 2.1.  Question if related to hypoperfusion - Continue to monitor  Essential hypertension Blood pressures initially elevated up to 152/109.  Patient not on blood pressure medications at baseline. - Hydralazine  IV as needed for elevated blood pressures  GERD Patient not currently on any medications for treatment. - Protonix   History of AAA -  Follow-up CT angiogram of the chest abdomen pelvis    DVT prophylaxis: Heparin Advance Care Planning:   Code Status: Do not attempt resuscitation (DNR) PRE-ARREST INTERVENTIONS DESIRED    Consults: Cardiology  Family Communication: Daughter updated at bedside  Severity of Illness: The appropriate patient status for this patient is INPATIENT. Inpatient status is judged to be reasonable and necessary in order to provide the required intensity of service to ensure the patient's safety. The patient's presenting symptoms, physical exam findings, and initial radiographic and laboratory data in the context of their chronic comorbidities is felt to place them at high risk for further clinical deterioration. Furthermore, it is not anticipated that the patient will be medically stable for discharge from the hospital  within 2 midnights of admission.   * I certify that at the point of admission it is my clinical judgment that the patient will require inpatient hospital care spanning beyond 2 midnights from the point of admission due to high intensity of service, high risk for further deterioration and high frequency of surveillance required.*  Author: Maximino DELENA Sharps, MD 01/13/2024 11:22 AM  For on call review [email or web address].

## 2024-01-13 NOTE — ED Triage Notes (Signed)
 Pt BIB [facility] from home due to respiratory distress.  Pt reported she had a hard time breathing.  Pt does report cough for the last few days with thick brown sputum.  [facility] rales in all fields.  No fever or chest pain reported.  Currently on C-pap with [facility].250g left forearm.  1 nitroglycerin given due to patient BP being elevated on scene. 4mg  Zofran  given en route. BP 150/100, HR 130-170

## 2024-01-13 NOTE — ED Notes (Signed)
 Phlebotomy at bedside.

## 2024-01-13 NOTE — ED Notes (Signed)
 CCMD called by this RN

## 2024-01-13 NOTE — Plan of Care (Signed)

## 2024-01-13 NOTE — Progress Notes (Signed)
 PHARMACY - ANTICOAGULATION CONSULT NOTE  Pharmacy Consult for heparin Indication: chest pain/ACS and atrial fibrillation  Allergies  Allergen Reactions   Lipitor [Atorvastatin ]     Dizzy ( fuzziness)   Latex Rash   Patient Measurements: Height: 5' 6 (167.6 cm) Weight: 70.3 kg (155 lb) IBW/kg (Calculated) : 59.3 HEPARIN DW (KG): 70.3  Vital Signs: Temp: 98 F (36.7 C) (10/01 1121) Temp Source: Temporal (10/01 1121) BP: 146/92 (10/01 0830) Pulse Rate: 104 (10/01 0830)  Labs: Recent Labs    01/13/24 0730 01/13/24 0811 01/13/24 1032  HGB 15.6* 16.0*  --   HCT 48.3* 47.0*  --   PLT 196  --   --   CREATININE 1.14*  --   --   TROPONINIHS 158*  --  889*   Estimated Creatinine Clearance: 31.3 mL/min (A) (by C-G formula based on SCr of 1.14 mg/dL (H)).  Medical History: Past Medical History:  Diagnosis Date   GERD (gastroesophageal reflux disease)    Hx of lumbosacral spine surgery t   Hyperlipidemia    Hypertension    Iliac artery aneurysm    Lumbar spinal stenosis    Polymyalgia rheumatica 06/2016   Medications:  None yet  Assessment: Patient is an 58 YOF presenting from home with respiratory distress and elevated troponins. Patient also with new afib. No history of anticoagulation PTA documented. Pharmacy has been consulted to dose heparin for elevated troponins/ACS and new onset atrial fibrillation.   Initial Hgb 16 and plt 196. No issues with bleeding noted.   Goal of Therapy:  Heparin level 0.3-0.7 units/ml Monitor platelets by anticoagulation protocol: Yes   Plan:  -Give heparin bolus of 4000 units followed by starting heparin infusion at 1000 units/h.  -Check heparin level in 8h.  -Monitor heparin level, CBC, and s/sx bleeding daily.   Maurilio Patten, PharmD PGY1 Pharmacy Resident [facility] 01/13/2024 11:41 AM

## 2024-01-13 NOTE — Progress Notes (Signed)
 Patient arrived to unit. No distress noted. Daughter at bedside. No complaints voiced.

## 2024-01-13 NOTE — ED Notes (Signed)
 Phlebotomy to collect outstanding blood cultures.

## 2024-01-13 NOTE — Progress Notes (Signed)
 Pt removed from EMS CPAP and placed on 6L [place]. Evaluated by this RRT for need to be placed back on positive pressure. NIV set up at bedside, pt feels much improved than before and feels okay not on NIV at this time. Will continue to monitor for need. Minimal distress noted at this time.

## 2024-01-13 NOTE — ED Notes (Signed)
 Assisted patient on bedpan.  Replaced pad under patient to a clean dry one. Asked patient if need anything and they stated they are were good.  Shows no signs of distress.  Family at bedside.

## 2024-01-13 NOTE — Progress Notes (Signed)
 PHARMACY - ANTICOAGULATION CONSULT NOTE  Pharmacy Consult for heparin Indication: chest pain/ACS and atrial fibrillation  Allergies  Allergen Reactions   Lipitor [Atorvastatin ]     Dizzy ( fuzziness)   Latex Rash   Patient Measurements: Height: 5' 6 (167.6 cm) Weight: 70.3 kg (155 lb) IBW/kg (Calculated) : 59.3 HEPARIN DW (KG): 70.3  Vital Signs: Temp: 97.7 F (36.5 C) (10/01 1957) Temp Source: Oral (10/01 1957) BP: 143/89 (10/01 1957) Pulse Rate: 96 (10/01 1817)  Labs: Recent Labs    01/13/24 0730 01/13/24 0811 01/13/24 1032 01/13/24 1918  HGB 15.6* 16.0*  --   --   HCT 48.3* 47.0*  --   --   PLT 196  --   --   --   HEPARINUNFRC  --   --   --  0.22*  CREATININE 1.14*  --   --   --   TROPONINIHS 158*  --  889*  --    Estimated Creatinine Clearance: 31.3 mL/min (A) (by C-G formula based on SCr of 1.14 mg/dL (H)).   Assessment: 65 YOF presenting from home with respiratory distress and elevated troponins. Patient also with new afib. No history of anticoagulation PTA documented. Pharmacy has been consulted to dose heparin for elevated troponins/ACS and new onset atrial fibrillation.   Heparin level low at 0.22 units/mL.  No issue with heparin infusion per RN.  Goal of Therapy:  Heparin level 0.3-0.7 units/ml Monitor platelets by anticoagulation protocol: Yes   Plan:  Increase IV heparin to 1150 units/hr Check 8 hr heparin level with AM labs  Decker Cogdell D. Lendell, PharmD, BCPS, BCCCP 01/13/2024, 8:26 PM

## 2024-01-13 NOTE — Consult Note (Signed)
 Referring Physician: Maximino Sharps, MD   Sydney [name] is an 89 y.o. female.                       Chief Complaint: Shortness of breath and abnormal Troponin I  HPI: 89 years old white female with 5 day history of worsening cough has shortness of breath. She has chest pain and epigastric area pain with cough. Her CXR is positive for pulmonary edema and echocardiogram at bedside shows severe LV systolic dysfunction without significant MR or TR. Her troponin I levels are moderately elevated. Her respiratory panel is negative for COVID, influenza A and B but positive for Respiratory Syncytial virus by PCR. She believes her son had some cough and cold before her symptoms started.  Past Medical History:  Diagnosis Date   GERD (gastroesophageal reflux disease)    Hx of lumbosacral spine surgery t   Hyperlipidemia    Hypertension    Iliac artery aneurysm    Lumbar spinal stenosis    Polymyalgia rheumatica 06/2016      Past Surgical History:  Procedure Laterality Date   JOINT REPLACEMENT  1992   right hip replacement   JOINT REPLACEMENT  2002   left hip replacement   ORIF ORBITAL FRACTURE Right 08/24/2020   Procedure: open reduction internal fixation of right orbital floor fracture;  Surgeon: Elisabeth Craig RAMAN, MD;  Location: [facility] OR;  Service: Plastics;  Laterality: Right;   ROTATOR CUFF REPAIR  2002   Left shoulder   SPINE SURGERY  2006 & 2010   posterior spinal fusion L4-5 L5S1    Family History  Problem Relation Age of Onset   Heart disease Mother    Heart disease Father        Heart Disease before age 80   Heart attack Father    Hyperlipidemia Father    Heart disease Sister    Diabetes Sister    Heart disease Brother    Hyperlipidemia Brother    Heart disease Brother    Cancer Brother    Social History:  reports that she has never smoked. She has been exposed to tobacco smoke. She has never used smokeless tobacco. She reports that she does not drink alcohol and does not use  drugs.  Allergies:  Allergies  Allergen Reactions   Lipitor [Atorvastatin ]     Dizzy ( fuzziness)   Latex Rash    (Not in a hospital admission)   Results for orders placed or performed during the hospital encounter of 01/13/24 (from the past 48 hours)  Comprehensive metabolic panel     Status: Abnormal   Collection Time: 01/13/24  7:30 AM  Result Value Ref Range   Sodium 137 135 - 145 mmol/L   Potassium 3.7 3.5 - 5.1 mmol/L   Chloride 105 98 - 111 mmol/L   CO2 18 (L) 22 - 32 mmol/L   Glucose, Bld 221 (H) 70 - 99 mg/dL    Comment: Glucose reference range applies only to samples taken after fasting for at least 8 hours.   BUN 12 8 - 23 mg/dL   Creatinine, Ser 8.85 (H) 0.44 - 1.00 mg/dL   Calcium  9.5 8.9 - 10.3 mg/dL   Total Protein 6.6 6.5 - 8.1 g/dL   Albumin 3.3 (L) 3.5 - 5.0 g/dL   AST 20 15 - 41 U/L   ALT 9 0 - 44 U/L   Alkaline Phosphatase 68 38 - 126 U/L   Total Bilirubin 0.7  0.0 - 1.2 mg/dL   GFR, Estimated 46 (L) >60 mL/min    Comment: (NOTE) Calculated using the CKD-EPI Creatinine Equation (2021)    Anion gap 14 5 - 15    Comment: Performed at [facility], [address] [place], [place] [address]  Troponin I (High Sensitivity)     Status: Abnormal   Collection Time: 01/13/24  7:30 AM  Result Value Ref Range   Troponin I (High Sensitivity) 158 (HH) <18 ng/L    Comment: CRITICAL RESULT CALLED TO, READ BACK BY AND VERIFIED WITH R GONZALEZ, RN 0900 10.1.2025 DBRADLEY (NOTE) Elevated high sensitivity troponin I (hsTnI) values and significant  changes across serial measurements may suggest ACS but many other  chronic and acute conditions are known to elevate hsTnI results.  Refer to the Links section for chest pain algorithms and additional  guidance. Performed at [facility], [address] [place], [place] [address]   CBC with Differential     Status: Abnormal   Collection Time: 01/13/24  7:30 AM  Result Value Ref Range   WBC 7.9 4.0 - 10.5  K/uL   RBC 5.12 (H) 3.87 - 5.11 MIL/uL   Hemoglobin 15.6 (H) 12.0 - 15.0 g/dL   HCT 51.6 (H) 63.9 - 53.9 %   MCV 94.3 80.0 - 100.0 fL   MCH 30.5 26.0 - 34.0 pg   MCHC 32.3 30.0 - 36.0 g/dL   RDW 86.7 88.4 - 84.4 %   Platelets 196 150 - 400 K/uL   nRBC 0.0 0.0 - 0.2 %   Neutrophils Relative % 78 %   Neutro Abs 6.1 1.7 - 7.7 K/uL   Lymphocytes Relative 15 %   Lymphs Abs 1.2 0.7 - 4.0 K/uL   Monocytes Relative 5 %   Monocytes Absolute 0.4 0.1 - 1.0 K/uL   Eosinophils Relative 1 %   Eosinophils Absolute 0.1 0.0 - 0.5 K/uL   Basophils Relative 1 %   Basophils Absolute 0.1 0.0 - 0.1 K/uL   Immature Granulocytes 0 %   Abs Immature Granulocytes 0.02 0.00 - 0.07 K/uL    Comment: Performed at [facility], [address], [place], [place] [address]  Resp panel by RT-PCR (RSV, Flu A&B, Covid) Anterior Nasal Swab     Status: Abnormal   Collection Time: 01/13/24  8:08 AM   Specimen: Anterior Nasal Swab  Result Value Ref Range   SARS Coronavirus 2 by RT PCR NEGATIVE NEGATIVE   Influenza A by PCR NEGATIVE NEGATIVE   Influenza B by PCR NEGATIVE NEGATIVE    Comment: (NOTE) The Xpert Xpress SARS-CoV-2/FLU/RSV plus assay is intended as an aid in the diagnosis of influenza from Nasopharyngeal swab specimens and should not be used as a sole basis for treatment. Nasal washings and aspirates are unacceptable for Xpert Xpress SARS-CoV-2/FLU/RSV testing.  Fact Sheet for Patients: BloggerCourse.com  Fact Sheet for Healthcare Providers: SeriousBroker.it  This test is not yet approved or cleared by the [place]  FDA and has been authorized for detection and/or diagnosis of SARS-CoV-2 by FDA under an Emergency Use Authorization (EUA). This EUA will remain in effect (meaning this test can be used) for the duration of the COVID-19 declaration under Section 564(b)(1) of the Act, 21 U.S.C. section 360bbb-3(b)(1), unless the authorization is  terminated or revoked.     Resp Syncytial Virus by PCR POSITIVE (A) NEGATIVE    Comment: (NOTE) Fact Sheet for Patients: BloggerCourse.com  Fact Sheet for Healthcare Providers:  SeriousBroker.it  This test is not yet approved or cleared by the [place]  FDA and has been authorized for detection and/or diagnosis of SARS-CoV-2 by FDA under an Emergency Use Authorization (EUA). This EUA will remain in effect (meaning this test can be used) for the duration of the COVID-19 declaration under Section 564(b)(1) of the Act, 21 U.S.C. section 360bbb-3(b)(1), unless the authorization is terminated or revoked.  Performed at [facility], [address] [place], [place] [address]   I-Stat Lactic Acid     Status: Abnormal   Collection Time: 01/13/24  8:11 AM  Result Value Ref Range   Lactic Acid, Venous 3.1 (HH) 0.5 - 1.9 mmol/L   Comment NOTIFIED PHYSICIAN   I-Stat venous blood gas, ED     Status: Abnormal   Collection Time: 01/13/24  8:11 AM  Result Value Ref Range   pH, Ven 7.313 7.25 - 7.43   pCO2, Ven 40.1 (L) 44 - 60 mmHg   pO2, Ven 145 (H) 32 - 45 mmHg   Bicarbonate 20.3 20.0 - 28.0 mmol/L   TCO2 22 22 - 32 mmol/L   O2 Saturation 99 %   Acid-base deficit 6.0 (H) 0.0 - 2.0 mmol/L   Sodium 140 135 - 145 mmol/L   Potassium 3.9 3.5 - 5.1 mmol/L   Calcium , Ion 1.24 1.15 - 1.40 mmol/L   HCT 47.0 (H) 36.0 - 46.0 %   Hemoglobin 16.0 (H) 12.0 - 15.0 g/dL   Sample type VENOUS   Troponin I (High Sensitivity)     Status: Abnormal   Collection Time: 01/13/24 10:32 AM  Result Value Ref Range   Troponin I (High Sensitivity) 889 (HH) <18 ng/L    Comment: CRITICAL VALUE NOTED. VALUE IS CONSISTENT WITH PREVIOUSLY REPORTED/CALLED VALUE (NOTE) Elevated high sensitivity troponin I (hsTnI) values and significant  changes across serial measurements may suggest ACS but many other  chronic and acute conditions are known to elevate hsTnI  results.  Refer to the Links section for chest pain algorithms and additional  guidance. Performed at [facility], [address], [place], [place] [address]   I-Stat Lactic Acid     Status: Abnormal   Collection Time: 01/13/24 10:45 AM  Result Value Ref Range   Lactic Acid, Venous 2.1 (HH) 0.5 - 1.9 mmol/L   Comment NOTIFIED PHYSICIAN   Procalcitonin     Status: None   Collection Time: 01/13/24 11:42 AM  Result Value Ref Range   Procalcitonin <0.10 ng/mL    Comment:        Interpretation: PCT (Procalcitonin) <= 0.5 ng/mL: Systemic infection (sepsis) is not likely. Local bacterial infection is possible. (NOTE)       Sepsis PCT Algorithm           Lower Respiratory Tract                                      Infection PCT Algorithm    ----------------------------     ----------------------------         PCT < 0.25 ng/mL                PCT < 0.10 ng/mL          Strongly encourage             Strongly discourage   discontinuation of antibiotics    initiation of antibiotics    ----------------------------     -----------------------------  PCT 0.25 - 0.50 ng/mL            PCT 0.10 - 0.25 ng/mL               OR       >80% decrease in PCT            Discourage initiation of                                            antibiotics      Encourage discontinuation           of antibiotics    ----------------------------     -----------------------------         PCT >= 0.50 ng/mL              PCT 0.26 - 0.50 ng/mL               AND        <80% decrease in PCT             Encourage initiation of                                             antibiotics       Encourage continuation           of antibiotics    ----------------------------     -----------------------------        PCT >= 0.50 ng/mL                  PCT > 0.50 ng/mL               AND         increase in PCT                  Strongly encourage                                      initiation of antibiotics     Strongly encourage escalation           of antibiotics                                     -----------------------------                                           PCT <= 0.25 ng/mL                                                 OR                                        > 80% decrease in PCT  Discontinue / Do not initiate                                             antibiotics  Performed at [facility], [address], [place], [place] [address]   Magnesium     Status: None   Collection Time: 01/13/24 11:42 AM  Result Value Ref Range   Magnesium 1.8 1.7 - 2.4 mg/dL    Comment: Performed at [facility], [address] [place], [place] [address]   DG Chest Port 1 View Result Date: 01/13/2024 CLINICAL DATA:  Cough, shortness of breath. EXAM: PORTABLE CHEST 1 VIEW COMPARISON:  September 21, 2008. FINDINGS: Stable cardiomediastinal silhouette. Bilateral interstitial densities are noted concerning for pulmonary edema with probable small bilateral pleural effusions. Bony thorax is unremarkable. IMPRESSION: Bilateral interstitial densities are noted concerning for pulmonary edema with probable small bilateral pleural effusions. Electronically Signed   By: Lynwood Landy Raddle M.D.   On: 01/13/2024 08:47    Review Of Systems Constitutional: No fever, chills, She has chronic weight loss. Eyes: No vision change, wears glasses. No discharge or pain. Ears: No hearing loss, No tinnitus. Respiratory: No asthma, COPD, positive pneumonias, shortness of breath. No hemoptysis. Cardiovascular: No chest pain, palpitation, leg edema. Gastrointestinal: No nausea, vomiting, diarrhea, constipation. No GI bleed. No hepatitis. Genitourinary: No dysuria, hematuria, kidney stone. No incontinance. Neurological: No headache, stroke, seizures.  Psychiatry: No psych facility admission for anxiety, depression, suicide. No detox. Skin: No rash. Musculoskeletal: No joint pain,  fibromyalgia. No neck pain, back pain. Lymphadenopathy: No lymphadenopathy. Hematology: No anemia or easy bruising.   Blood pressure (!) 144/95, pulse 96, temperature 98 F (36.7 C), temperature source Temporal, resp. rate 18, height 5' 6 (1.676 m), weight 70.3 kg, SpO2 99%. Body mass index is 25.02 kg/m. General appearance: alert, cooperative, appears stated age and mild respiratory distress Head: Normocephalic, atraumatic. Eyes: Blue eyes, pink conjunctiva, corneas clear.  Neck: No adenopathy, no carotid bruit, Positive JVD at 30 degree angle, supple, symmetrical, trachea midline and thyroid  not enlarged. Resp: fine crackles to auscultation bilaterally. Cardio: Irregular rate and rhythm, S1, S2 normal, II/VI systolic murmur, no click, rub or gallop GI: Soft, non-tender; bowel sounds normal; no organomegaly. Extremities: No edema, cyanosis or clubbing. Skin: Warm and dry.  Neurologic: Alert and oriented X 3, normal strength. Normal coordination.  Assessment/Plan Dilated cardiomyopathy with viral myocarditis Abnormal troponin I from demand ischemia Possible Small NSTEMI Atrial fibrillation with RVR HTN HLD  Plan: Check CRP/ESR. Prednisone  use if needed. Low dose Beta blocker as tolerated. Avoid diltiazem for LV dysfunction. IV lasix  as needed and tolerated. Discussed medical treatment. Will postpone cardiac interventions for now. Patient and daughter are in agreement due to advanced age also.  Time spent: Review of old records, Lab, x-rays, EKG, other cardiac tests, examination, discussion with patient/doctor/Family/Tech/Nurse over 70 minutes.  Salena GORMAN Negri, MD  01/13/2024, 2:24 PM

## 2024-01-13 NOTE — ED Provider Notes (Signed)
 [place] EMERGENCY DEPARTMENT AT [facility] Provider Note   CSN: [id number] Arrival date & time: 01/13/24  9273     Patient presents with: No chief complaint on file.   Sydney [name] is a 89 y.o. female.   HPI Patient denies history of cardiac or pulmonary disease.  She lives at home independently with her son living nearby.  Patient reports that about 5 days ago she started getting a cough.  She reports has been productive of a brown-tinged sputum.  She has been trying to treated at home with over-the-counter products.  She reports she has been coughing a lot and has developed some chest pain with cough.  She reports shortness of breath has been getting worse and today she got very short of breath and felt that she could not catch her breath and became sweaty.  At that time she became scared and called EMS.  EMS placed the patient on CPAP and administered 1 nitroglycerin due to hypertension.  Patient was given 4 mg of Zofran  en route.  Patient denies she has had any abdominal pain.  No nausea no vomiting.  No lower extremity edema or calf swelling.  Patient reports her son's had a bit of a nasal congestion and mild cough.  She does not have significant exposure to other people.    Prior to Admission medications   Medication Sig Start Date End Date Taking? Authorizing Provider  acetaminophen  (TYLENOL ) 325 MG tablet Take 325 mg by mouth daily as needed for mild pain (pain score 1-3) or moderate pain (pain score 4-6).   Yes [provider]  pantoprazole  (PROTONIX ) 40 MG tablet TAKE 1 TABLET BY MOUTH EVERY DAY Patient not taking: Reported on 01/13/2024 08/24/23   [name], MD    Allergies: Lipitor [atorvastatin ] and Latex    Review of Systems  Updated Vital Signs BP (!) 145/94   Pulse (!) 102   Temp 98 F (36.7 C) (Temporal)   Resp 18   Ht 5' 6 (1.676 m)   Wt 70.3 kg   SpO2 100%   BMI 25.02 kg/m   Physical Exam Constitutional:      Comments: Alert  with clear mental status.  Speaking in full sentences without difficulty.  Mild increased work of breathing at rest.  Well-nourished well-developed.  Excellent condition for age.  HENT:     Mouth/Throat:     Pharynx: Oropharynx is clear.  Eyes:     Extraocular Movements: Extraocular movements intact.  Cardiovascular:     Rate and Rhythm: Tachycardia present. Rhythm irregular.  Pulmonary:     Comments: Mild increased work of breathing.  Patient has fine expiratory wheeze throughout the lung fields.  Crackles seems to localize to right lower mid lung field.  Patient does have a emesis bag with some brown-tinged sputum.  Not overtly bloody. Abdominal:     General: There is no distension.     Palpations: Abdomen is soft.     Tenderness: There is no abdominal tenderness. There is no guarding.  Musculoskeletal:        General: No swelling or tenderness. Normal range of motion.     Right lower leg: No edema.     Left lower leg: No edema.  Skin:    General: Skin is warm and dry.  Neurological:     General: No focal deficit present.     Mental Status: She is oriented to person, place, and time.     Motor: No  weakness.     Coordination: Coordination normal.  Psychiatric:        Mood and Affect: Mood normal.     (all labs ordered are listed, but only abnormal results are displayed) Labs Reviewed  RESP PANEL BY RT-PCR (RSV, FLU A&B, COVID)  RVPGX2 - Abnormal; Notable for the following components:      Result Value   Resp Syncytial Virus by PCR POSITIVE (*)    All other components within normal limits  COMPREHENSIVE METABOLIC PANEL WITH GFR - Abnormal; Notable for the following components:   CO2 18 (*)    Glucose, Bld 221 (*)    Creatinine, Ser 1.14 (*)    Albumin 3.3 (*)    GFR, Estimated 46 (*)    All other components within normal limits  CBC WITH DIFFERENTIAL/PLATELET - Abnormal; Notable for the following components:   RBC 5.12 (*)    Hemoglobin 15.6 (*)    HCT 48.3 (*)    All  other components within normal limits  I-STAT CG4 LACTIC ACID, ED - Abnormal; Notable for the following components:   Lactic Acid, Venous 3.1 (*)    All other components within normal limits  I-STAT VENOUS BLOOD GAS, ED - Abnormal; Notable for the following components:   pCO2, Ven 40.1 (*)    pO2, Ven 145 (*)    Acid-base deficit 6.0 (*)    HCT 47.0 (*)    Hemoglobin 16.0 (*)    All other components within normal limits  I-STAT CG4 LACTIC ACID, ED - Abnormal; Notable for the following components:   Lactic Acid, Venous 2.1 (*)    All other components within normal limits  TROPONIN I (HIGH SENSITIVITY) - Abnormal; Notable for the following components:   Troponin I (High Sensitivity) 158 (*)    All other components within normal limits  TROPONIN I (HIGH SENSITIVITY) - Abnormal; Notable for the following components:   Troponin I (High Sensitivity) 889 (*)    All other components within normal limits  CULTURE, BLOOD (ROUTINE X 2)  CULTURE, BLOOD (ROUTINE X 2)  MAGNESIUM  URINALYSIS, ROUTINE W REFLEX MICROSCOPIC  BRAIN NATRIURETIC PEPTIDE  PROCALCITONIN  TSH  HEPARIN LEVEL (UNFRACTIONATED)    EKG: EKG Interpretation Date/Time:  Wednesday January 13 2024 07:28:56 EDT Ventricular Rate:  125 PR Interval:  142 QRS Duration:  93 QT Interval:  294 QTC Calculation: 424 R Axis:   36  Text Interpretation: Paroxysmal atrial fibrillation Atrial premature complexes Probable LVH with secondary repol abnrm Isolated Q wave with repolarization abnormality III. Lateral T wave flattening. no STEMI criteria. rate increase and afib new since first previous Confirmed by Armenta Canning [phone]) on 01/13/2024 8:39:52 AM  Radiology: [facility] Chest Port 1 View Result Date: 01/13/2024 CLINICAL DATA:  Cough, shortness of breath. EXAM: PORTABLE CHEST 1 VIEW COMPARISON:  September 21, 2008. FINDINGS: Stable cardiomediastinal silhouette. Bilateral interstitial densities are noted concerning for pulmonary edema with  probable small bilateral pleural effusions. Bony thorax is unremarkable. IMPRESSION: Bilateral interstitial densities are noted concerning for pulmonary edema with probable small bilateral pleural effusions. Electronically Signed   By: Lynwood Landy Raddle M.D.   On: 01/13/2024 08:47     Procedures  CRITICAL CARE Performed by: Canning Armenta   Total critical care time: 30 minutes  Critical care time was exclusive of separately billable procedures and treating other patients.  Critical care was necessary to treat or prevent imminent or life-threatening deterioration.  Critical care was time spent personally by me on the  following activities: development of treatment plan with patient and/or surrogate as well as nursing, discussions with consultants, evaluation of patient's response to treatment, examination of patient, obtaining history from patient or surrogate, ordering and performing treatments and interventions, ordering and review of laboratory studies, ordering and review of radiographic studies, pulse oximetry and re-evaluation of patient's condition.  Medications Ordered in the ED  sodium chloride  flush (NS) 0.9 % injection 3 mL (3 mLs Intravenous Not Given 01/13/24 1216)  acetaminophen  (TYLENOL ) tablet 650 mg (has no administration in time range)    Or  acetaminophen  (TYLENOL ) suppository 650 mg (has no administration in time range)  albuterol (PROVENTIL) (2.5 MG/3ML) 0.083% nebulizer solution 2.5 mg (has no administration in time range)  guaiFENesin (MUCINEX) 12 hr tablet 600 mg (600 mg Oral Given 01/13/24 1211)  heparin ADULT infusion 100 units/mL (25000 units/250mL) (1,000 Units/hr Intravenous New Bag/Given 01/13/24 1215)  ipratropium-albuterol (DUONEB) 0.5-2.5 (3) MG/3ML nebulizer solution 3 mL (3 mLs Nebulization Given 01/13/24 0826)  lactated ringers  bolus 500 mL (0 mLs Intravenous Stopped 01/13/24 0915)  heparin bolus via infusion 4,000 Units (4,000 Units Intravenous Bolus from Bag  01/13/24 1215)                                    Medical Decision Making Amount and/or Complexity of Data Reviewed Labs: ordered. Radiology: ordered.  Risk Prescription drug management. Decision regarding hospitalization.  Presents as outlined with worsening productive cough and shortness of breath.  Now requiring oxygen.  At baseline patient does not require oxygen and does not have significant cardiopulmonary history.  Will proceed with diagnostic evaluation to include pneumonia\ACS\A-fib with CHF.  Patient reports she has improved since EMS arrival and treatment.  She is comfortable on nasal cannula oxygen.  At this time does not require additional respiratory support.  She does not have any active chest pain.  Will continue to monitor closely. will administer a DuoNeb for ongoing wheezing in the setting of respiratory illness very suggestive of pneumonia.  Lactic 2.1 troponin elevated from 158-889.  Respiratory panel positive for RSV.  Venous gas 7.3 pCO2 40 GFR 46.  Sodium potassium normal.  White count 7.9 H&H 15 and 48  Chest x-ray personally reviewed by myself and reviewed by radiology bilateral infiltrates suggestive of edema per radiology.  Also with diagnosis of RSV and productive cough consideration for viral pneumonia picture or secondary bacterial pneumonia.  At this time patient has new onset A-fib.  I suspect this is precipitated by respiratory illness with viral  RSV pneumonia.  Patient's troponin has elevated.  Question if this is demand ischemia from this morning with atrial fibrillation or NSTEMI.  Patient will require admission for additional diagnostic evaluation as well as therapeutic treatment.   Consult: Reviewed Dr. Claudene Triad hospitalist for admission.     Final diagnoses:  RSV (acute bronchiolitis due to respiratory syncytial virus)  Viral pneumonia  New onset a-fib [facility])    ED Discharge Orders     None          Armenta Canning, MD 01/13/24  1249

## 2024-01-14 ENCOUNTER — Telehealth (HOSPITAL_COMMUNITY): Payer: Self-pay | Admitting: Pharmacy Technician

## 2024-01-14 ENCOUNTER — Other Ambulatory Visit (HOSPITAL_COMMUNITY): Payer: Self-pay

## 2024-01-14 DIAGNOSIS — J210 Acute bronchiolitis due to respiratory syncytial virus: Secondary | ICD-10-CM

## 2024-01-14 DIAGNOSIS — I4891 Unspecified atrial fibrillation: Secondary | ICD-10-CM | POA: Diagnosis not present

## 2024-01-14 DIAGNOSIS — J129 Viral pneumonia, unspecified: Secondary | ICD-10-CM | POA: Diagnosis not present

## 2024-01-14 DIAGNOSIS — J9601 Acute respiratory failure with hypoxia: Secondary | ICD-10-CM | POA: Diagnosis not present

## 2024-01-14 LAB — CBC
HCT: 42.4 % (ref 36.0–46.0)
Hemoglobin: 13.8 g/dL (ref 12.0–15.0)
MCH: 30.3 pg (ref 26.0–34.0)
MCHC: 32.5 g/dL (ref 30.0–36.0)
MCV: 93 fL (ref 80.0–100.0)
Platelets: 172 K/uL (ref 150–400)
RBC: 4.56 MIL/uL (ref 3.87–5.11)
RDW: 13.2 % (ref 11.5–15.5)
WBC: 6.1 K/uL (ref 4.0–10.5)
nRBC: 0 % (ref 0.0–0.2)

## 2024-01-14 LAB — BASIC METABOLIC PANEL WITH GFR
Anion gap: 8 (ref 5–15)
BUN: 12 mg/dL (ref 8–23)
CO2: 25 mmol/L (ref 22–32)
Calcium: 9.1 mg/dL (ref 8.9–10.3)
Chloride: 107 mmol/L (ref 98–111)
Creatinine, Ser: 1.13 mg/dL — ABNORMAL HIGH (ref 0.44–1.00)
GFR, Estimated: 47 mL/min — ABNORMAL LOW (ref >60)
Glucose, Bld: 109 mg/dL — ABNORMAL HIGH (ref 70–99)
Potassium: 4 mmol/L (ref 3.5–5.1)
Sodium: 140 mmol/L (ref 135–145)

## 2024-01-14 LAB — HEPARIN LEVEL (UNFRACTIONATED): Heparin Unfractionated: 0.4 [IU]/mL (ref 0.30–0.70)

## 2024-01-14 LAB — LIPID PANEL
Cholesterol: 204 mg/dL — ABNORMAL HIGH (ref 0–200)
HDL: 61 mg/dL (ref >40)
LDL Cholesterol: 129 mg/dL — ABNORMAL HIGH (ref 0–99)
Total CHOL/HDL Ratio: 3.3 ratio
Triglycerides: 72 mg/dL (ref <150)
VLDL: 14 mg/dL (ref 0–40)

## 2024-01-14 MED ORDER — SENNOSIDES-DOCUSATE SODIUM 8.6-50 MG PO TABS
2.0000 | ORAL_TABLET | Freq: Every evening | ORAL | Status: DC | PRN
  Administered 2024-01-14: 2 via ORAL
  Filled 2024-01-14: qty 2

## 2024-01-14 MED ORDER — LOSARTAN POTASSIUM 50 MG PO TABS
25.0000 mg | ORAL_TABLET | Freq: Every day | ORAL | Status: DC
Start: 2024-01-14 — End: 2024-01-15
  Administered 2024-01-14 – 2024-01-15 (×2): 25 mg via ORAL
  Filled 2024-01-14 (×2): qty 1

## 2024-01-14 MED ORDER — ORAL CARE MOUTH RINSE: 15.0000 mL | OROMUCOSAL | Status: DC | PRN

## 2024-01-14 MED ORDER — ACETAMINOPHEN 325 MG PO TABS
650.0000 mg | ORAL_TABLET | Freq: Once | ORAL | Status: AC
  Administered 2024-01-14: 650 mg via ORAL
  Filled 2024-01-14: qty 2

## 2024-01-14 MED ORDER — POLYETHYLENE GLYCOL 3350 17 G PO PACK
17.0000 g | PACK | Freq: Every day | ORAL | Status: DC
  Administered 2024-01-14 – 2024-01-15 (×2): 17 g via ORAL
  Filled 2024-01-14 (×2): qty 1

## 2024-01-14 MED ORDER — ASPIRIN 81 MG PO TBEC
81.0000 mg | DELAYED_RELEASE_TABLET | Freq: Every day | ORAL | Status: DC
  Administered 2024-01-14 – 2024-01-15 (×2): 81 mg via ORAL
  Filled 2024-01-14 (×2): qty 1

## 2024-01-14 MED ORDER — BISACODYL 10 MG RE SUPP: 10.0000 mg | Freq: Every day | RECTAL | Status: DC | PRN

## 2024-01-14 NOTE — Consult Note (Signed)
 Ref: Sydney [name], MD   Subjective:  Awake.  VS stable. Breathing is improving per patient.  Trace leg edema. CRP is normal.BNP is elevated at 1,624.1 pg.  Objective:  Vital Signs in the last 24 hours: Temp:  [97.6 F (36.4 C)-98 F (36.7 C)] 97.7 F (36.5 C) (10/02 1200) Pulse Rate:  [74-100] 74 (10/02 1200) Cardiac Rhythm: Sinus tachycardia (10/02 0707) Resp:  [17-32] 21 (10/02 1200) BP: (114-161)/(66-101) 132/79 (10/02 1200) SpO2:  [92 %-100 %] 92 % (10/02 1200)  Physical Exam: BP Readings from Last 1 Encounters:  01/14/24 132/79     Wt Readings from Last 1 Encounters:  01/13/24 70.3 kg    Weight change:  Body mass index is 25.02 kg/m. HEENT: [place]/AT, Eyes-Blue,  Conjunctiva-Pink, Sclera-Non-icteric Neck: No JVD, No bruit, Trachea midline. Lungs:  Clearing, Bilateral. Cardiac:  Regular rhythm, normal S1 and S2, no S3. II/VI systolic murmur. Abdomen:  Soft, non-tender. BS present. Extremities:  Trace lower leg edema present. No cyanosis. No clubbing. CNS: AxOx3, Cranial nerves grossly intact, moves all 4 extremities.  Skin: Warm and dry.   Intake/Output from previous day: 10/01 0701 - 10/02 0700 In: 3 [I.V.:3] Out: -     Lab Results: BMET    Component Value Date/Time   NA 140 01/14/2024 0257   NA 140 01/13/2024 0811   NA 137 01/13/2024 0730   K 4.0 01/14/2024 0257   K 3.9 01/13/2024 0811   K 3.7 01/13/2024 0730   CL 107 01/14/2024 0257   CL 105 01/13/2024 0730   CL 103 07/01/2023 0941   CO2 25 01/14/2024 0257   CO2 18 (L) 01/13/2024 0730   CO2 27 07/01/2023 0941   GLUCOSE 109 (H) 01/14/2024 0257   GLUCOSE 221 (H) 01/13/2024 0730   GLUCOSE 110 (H) 07/01/2023 0941   BUN 12 01/14/2024 0257   BUN 12 01/13/2024 0730   BUN 11 07/01/2023 0941   CREATININE 1.13 (H) 01/14/2024 0257   CREATININE 1.14 (H) 01/13/2024 0730   CREATININE 0.95 07/01/2023 0941   CREATININE 1.13 (H) 01/13/2014 1639   CALCIUM  9.1 01/14/2024 0257   CALCIUM  9.5 01/13/2024 0730    CALCIUM  10.7 (H) 07/01/2023 0941   GFRNONAA 47 (L) 01/14/2024 0257   GFRNONAA 46 (L) 01/13/2024 0730   GFRNONAA >60 08/28/2020 0452   GFRAA 59 (L) 09/02/2019 1305   GFRAA 55 (L) 08/09/2015 0849   GFRAA  09/26/2008 0344    >60        The eGFR has been calculated using the MDRD equation. This calculation has not been validated in all clinical situations. eGFR's persistently <60 mL/min signify possible Chronic Kidney Disease.   CBC    Component Value Date/Time   WBC 6.1 01/14/2024 0257   RBC 4.56 01/14/2024 0257   HGB 13.8 01/14/2024 0257   HCT 42.4 01/14/2024 0257   PLT 172 01/14/2024 0257   MCV 93.0 01/14/2024 0257   MCH 30.3 01/14/2024 0257   MCHC 32.5 01/14/2024 0257   RDW 13.2 01/14/2024 0257   LYMPHSABS 1.2 01/13/2024 0730   MONOABS 0.4 01/13/2024 0730   EOSABS 0.1 01/13/2024 0730   BASOSABS 0.1 01/13/2024 0730   HEPATIC Function Panel Recent Labs    07/01/23 0941 01/13/24 0730  PROT 7.1 6.6  ALBUMIN 4.4 3.3*  AST 16 20  ALT 9 9  ALKPHOS 74 68   HEMOGLOBIN A1C Lab Results  Component Value Date   MPG 128 (H) 01/13/2014   CARDIAC ENZYMES No results found for:  CKTOTAL, CKMB, CKMBINDEX, TROPONINI BNP No results for input(s): PROBNP in the last 8760 hours. TSH Recent Labs    07/01/23 0941 01/13/24 1220  TSH 0.90 1.249   CHOLESTEROL No results for input(s): CHOL in the last 8760 hours.  Scheduled Meds:  aspirin  EC  81 mg Oral Daily   carvedilol  3.125 mg Oral BID WC   guaiFENesin  600 mg Oral BID   sodium chloride  flush  3 mL Intravenous Q12H   Continuous Infusions:  heparin 1,150 Units/hr (01/14/24 0733)   PRN Meds:.acetaminophen  **OR** acetaminophen , albuterol, hydrALAZINE   Assessment/Plan: Acute systolic left heart failure Ischemic and Dilated cardiomyopathy Abnormal troponin I from demand ischemia Possible Small NSTEMI Atrial fibrillation with RVR HTN HLD RSV respiratory infection  Plan: Continue medical  treatment. Check lipid panel.   LOS: 1 day   Time spent including chart review, lab review, examination, discussion with patient/Nurse/Family : 30 min   [name]  MD  01/14/2024, 12:20 PM

## 2024-01-14 NOTE — Progress Notes (Signed)
 Heart Failure Navigator Progress Note  Assessed for Heart & Vascular TOC clinic readiness.  Patient does not meet criteria due to is a Dr. Claudene patient. He will see in his office. No HF TOC.   Navigator available for reassessment of patient.  Stephane Haddock, BSN, Scientist, clinical (histocompatibility and immunogenetics) Only

## 2024-01-14 NOTE — Progress Notes (Signed)
 PROGRESS NOTE        PATIENT DETAILS Name: Sydney [name] Age: 89 y.o. Sex: female Date of Birth: 10/27/1932 Admit Date: 01/13/2024 Admitting Physician Maximino DELENA Sharps, MD ERE:Ozucjx, [name], MD  Brief Summary: Patient is a 89 y.o.  female with history of PMR, GERD, HTN, HLD-who presented with cough/congestion and worsening shortness of breath-patient was found to have RSV infection-acute pulm edema in the setting of new onset HFrEF along with A-fib RVR.  Significant events: 10/1>> admit to [facility]  Significant studies: 10/1>> CT angio abdomen/pelvis: No dissection/aneurysm, focal pseudoaneurysm from right common iliac artery. 10/1>> echo: EF 20-25%, grade 1 diastolic dysfunction  Significant microbiology data: 10/1>> RSV PCR: Positive 10/1>> COVID/influenza: Negative 10/1> blood culture: Negative  Procedures: None  Consults: Cardiology  Subjective: Feels better-lying flat.  No chest pain or shortness of breath but has not gotten out of bed yet.  Objective: Vitals: Blood pressure 124/66, pulse 94, temperature 97.6 F (36.4 C), temperature source Oral, resp. rate 19, height 5' 6 (1.676 m), weight 70.3 kg, SpO2 97%.   Exam: Gen Exam:Alert awake-not in any distress HEENT:atraumatic, normocephalic Chest: B/L clear to auscultation anteriorly CVS:S1S2 regular Abdomen:soft non tender, non distended Extremities:no edema Neurology: Non focal Skin: no rash  Pertinent Labs/Radiology:    Latest Ref Rng & Units 01/14/2024    2:57 AM 01/13/2024    8:11 AM 01/13/2024    7:30 AM  CBC  WBC 4.0 - 10.5 K/uL 6.1   7.9   Hemoglobin 12.0 - 15.0 g/dL 86.1  83.9  84.3   Hematocrit 36.0 - 46.0 % 42.4  47.0  48.3   Platelets 150 - 400 K/uL 172   196     Lab Results  Component Value Date   NA 140 01/14/2024   K 4.0 01/14/2024   CL 107 01/14/2024   CO2 25 01/14/2024      Assessment/Plan: Acute hypoxic respiratory failure secondary to acute pulmonary  edema due to new onset acute HFrEF Hypoxia resolved On room air Continue to treat underlying CHF.  New onset acute HFrEF Unclear if this is ischemic etiology or viral myocarditis Cardiology recommending medical management given advanced age Volume status stable Defer diuretics to cardiology service Remains on low-dose beta-blocker Cardiology will initiate GDMT meds accordingly.  Non-STEMI No further chest pain Medical management plan per cardiology IV heparin x 48 hours Add aspirin  Continue low-dose beta-blocker Apparently has intolerance to statin in the past.  PAF with RVR Rate controlled Continue Coreg Currently on IV heparin-probably can be switched to Eliquis. Telemetry monitoring Await cardiology input  HTN BP stable Coreg  GERD PPI  Lactic acidosis Secondary to hypoperfusion in the setting of CHF-sepsis ruled out.  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  6 millimeter right lung nodule Incidental finding on CT imaging Stable for outpatient follow-up with PCP for further workup  Code status:   Code Status: Do not attempt resuscitation (DNR) PRE-ARREST INTERVENTIONS DESIRED   DVT Prophylaxis:IV Heparin    Family Communication: None at bedside   Disposition Plan: Status is: Inpatient Remains inpatient appropriate because: Severity of illness   Planned Discharge Destination:Home health   Diet: Diet Order             Diet regular Room service appropriate? Yes; Fluid consistency: Thin  Diet effective now  Antimicrobial agents: Anti-infectives (From admission, onward)    None        MEDICATIONS: Scheduled Meds:  carvedilol  3.125 mg Oral BID WC   guaiFENesin  600 mg Oral BID   sodium chloride  flush  3 mL Intravenous Q12H   Continuous Infusions:  heparin 1,150 Units/hr (01/14/24 0733)   PRN Meds:.acetaminophen  **OR** acetaminophen ,  albuterol, hydrALAZINE    I have personally reviewed following labs and imaging studies  LABORATORY DATA: CBC: Recent Labs  Lab 01/13/24 0730 01/13/24 0811 01/14/24 0257  WBC 7.9  --  6.1  NEUTROABS 6.1  --   --   HGB 15.6* 16.0* 13.8  HCT 48.3* 47.0* 42.4  MCV 94.3  --  93.0  PLT 196  --  172    Basic Metabolic Panel: Recent Labs  Lab 01/13/24 0730 01/13/24 0811 01/13/24 1142 01/14/24 0257  NA 137 140  --  140  K 3.7 3.9  --  4.0  CL 105  --   --  107  CO2 18*  --   --  25  GLUCOSE 221*  --   --  109*  BUN 12  --   --  12  CREATININE 1.14*  --   --  1.13*  CALCIUM  9.5  --   --  9.1  MG  --   --  1.8  --     GFR: Estimated Creatinine Clearance: 31.6 mL/min (A) (by C-G formula based on SCr of 1.13 mg/dL (H)).  Liver Function Tests: Recent Labs  Lab 01/13/24 0730  AST 20  ALT 9  ALKPHOS 68  BILITOT 0.7  PROT 6.6  ALBUMIN 3.3*   No results for input(s): LIPASE, AMYLASE in the last 168 hours. No results for input(s): AMMONIA in the last 168 hours.  Coagulation Profile: No results for input(s): INR, PROTIME in the last 168 hours.  Cardiac Enzymes: No results for input(s): CKTOTAL, CKMB, CKMBINDEX, TROPONINI in the last 168 hours.  BNP (last 3 results) No results for input(s): PROBNP in the last 8760 hours.  Lipid Profile: No results for input(s): CHOL, HDL, LDLCALC, TRIG, CHOLHDL, LDLDIRECT in the last 72 hours.  Thyroid  Function Tests: Recent Labs    01/13/24 1220  TSH 1.249    Anemia Panel: No results for input(s): VITAMINB12, FOLATE, FERRITIN, TIBC, IRON, RETICCTPCT in the last 72 hours.  Urine analysis:    Component Value Date/Time   COLORURINE YELLOW 01/13/2024 1609   APPEARANCEUR CLEAR 01/13/2024 1609   LABSPEC 1.010 01/13/2024 1609   PHURINE 5.0 01/13/2024 1609   GLUCOSEU NEGATIVE 01/13/2024 1609   HGBUR NEGATIVE 01/13/2024 1609   BILIRUBINUR NEGATIVE 01/13/2024 1609   BILIRUBINUR  negative 02/26/2016 0937   KETONESUR NEGATIVE 01/13/2024 1609   PROTEINUR NEGATIVE 01/13/2024 1609   UROBILINOGEN negative 02/26/2016 0937   NITRITE NEGATIVE 01/13/2024 1609   LEUKOCYTESUR SMALL (A) 01/13/2024 1609    Sepsis Labs: Lactic Acid, Venous    Component Value Date/Time   LATICACIDVEN 2.1 (HH) 01/13/2024 1045    MICROBIOLOGY: Recent Results (from the past 240 hours)  Culture, blood (routine x 2)     Status: None (Preliminary result)   Collection Time: 01/13/24  8:05 AM   Specimen: BLOOD RIGHT FOREARM  Result Value Ref Range Status   Specimen Description BLOOD RIGHT FOREARM  Final   Special Requests   Final    BOTTLES DRAWN AEROBIC AND ANAEROBIC Blood Culture adequate volume   Culture   Final    NO GROWTH 1 DAY  Performed at [facility], [address] [place], [place] [address]    Report Status PENDING  Incomplete  Resp panel by RT-PCR (RSV, Flu A&B, Covid) Anterior Nasal Swab     Status: Abnormal   Collection Time: 01/13/24  8:08 AM   Specimen: Anterior Nasal Swab  Result Value Ref Range Status   SARS Coronavirus 2 by RT PCR NEGATIVE NEGATIVE Final   Influenza A by PCR NEGATIVE NEGATIVE Final   Influenza B by PCR NEGATIVE NEGATIVE Final    Comment: (NOTE) The Xpert Xpress SARS-CoV-2/FLU/RSV plus assay is intended as an aid in the diagnosis of influenza from Nasopharyngeal swab specimens and should not be used as a sole basis for treatment. Nasal washings and aspirates are unacceptable for Xpert Xpress SARS-CoV-2/FLU/RSV testing.  Fact Sheet for Patients: BloggerCourse.com  Fact Sheet for Healthcare Providers: SeriousBroker.it  This test is not yet approved or cleared by the [place]  FDA and has been authorized for detection and/or diagnosis of SARS-CoV-2 by FDA under an Emergency Use Authorization (EUA). This EUA will remain in effect (meaning this test can be used) for the duration of  the COVID-19 declaration under Section 564(b)(1) of the Act, 21 U.S.C. section 360bbb-3(b)(1), unless the authorization is terminated or revoked.     Resp Syncytial Virus by PCR POSITIVE (A) NEGATIVE Final    Comment: (NOTE) Fact Sheet for Patients: BloggerCourse.com  Fact Sheet for Healthcare Providers: SeriousBroker.it  This test is not yet approved or cleared by the [place]  FDA and has been authorized for detection and/or diagnosis of SARS-CoV-2 by FDA under an Emergency Use Authorization (EUA). This EUA will remain in effect (meaning this test can be used) for the duration of the COVID-19 declaration under Section 564(b)(1) of the Act, 21 U.S.C. section 360bbb-3(b)(1), unless the authorization is terminated or revoked.  Performed at [facility], [address] [place], [place] [address]   Culture, blood (routine x 2)     Status: None (Preliminary result)   Collection Time: 01/13/24  8:10 AM   Specimen: BLOOD LEFT HAND  Result Value Ref Range Status   Specimen Description BLOOD LEFT HAND  Final   Special Requests   Final    BOTTLES DRAWN AEROBIC AND ANAEROBIC Blood Culture adequate volume   Culture   Final    NO GROWTH < 24 HOURS Performed at [facility], [address] [place], [place] [address]    Report Status PENDING  Incomplete    RADIOLOGY STUDIES/RESULTS: CT Angio Abd/Pel w/ and/or w/o Result Date: 01/13/2024 CLINICAL DATA:  Abdominal aortic aneurysm (AAA), follow up Patient coming in with new onset atrial fibrillation and history of AAA and shortness of breath EXAM: CTA ABDOMEN AND PELVIS WITHOUT AND WITH CONTRAST TECHNIQUE: Multidetector CT imaging of the abdomen and pelvis was performed using the standard protocol during bolus administration of intravenous contrast. Multiplanar reconstructed images and MIPs were obtained and reviewed to evaluate the vascular anatomy. RADIATION DOSE REDUCTION:  This exam was performed according to the departmental dose-optimization program which includes automated exposure control, adjustment of the mA and/or kV according to patient size and/or use of iterative reconstruction technique. CONTRAST:  75mL OMNIPAQUE  IOHEXOL  350 MG/ML SOLN COMPARISON:  None Available. FINDINGS: VASCULAR Aorta: No aortic aneurysm, intramural hematoma, or aortic dissection. Diffuse calcified noncalcified atherosclerosis throughout the aorta resulting in mild stenosis throughout. Celiac: Patent without acute thrombus, aneurysm, or dissection.No hemodynamically significant stenosis. SMA: Patent without acute thrombus, aneurysm, or dissection.No hemodynamically  significant stenosis. Renals: Patent without acute thrombus, aneurysm, or dissection.Moderate to severe narrowing of the right renal artery from mixed density plaque at the ostium. IMA: Patent without acute thrombus, aneurysm, or dissection.Severe narrowing of the IMA ostium from noncalcified plaque. There is a focal, noncalcified moderate stenosis just downstream from the ostium. Inflow: Focal pseudoaneurysm arising from the posterior right common iliac artery measuring 1.6 cm (axial 137). Fusiform ectasia of the mid and distal left common iliac artery measuring 2 cm.Diffusely calcified and noncalcified plaque throughout the common iliac arteries causing moderate diffuse stenosis on the left. The external iliac arteries are widely patent bilaterally. Proximal Outflow: The bilateral common femoral and visualized portions of the superficial and profunda femoral arteries are patent without acute thrombus, aneurysm, or dissection.No hemodynamically significant stenosis. Veins: No obvious venous abnormality within the limitations of this arterial phase study. Review of the MIP images confirms the above findings. NON-VASCULAR Lower chest: Moderate volume bilateral pleural effusions with dense airspace consolidation in both lower lobes, likely  atelectasis.6 mm right middle lobe nodule (axial 16). Hepatobiliary: No mass.No radiopaque stones or wall thickening of the gallbladder.No intrahepatic or extrahepatic biliary ductal dilation. Pancreas: No mass or main ductal dilation.No peripancreatic inflammation or fluid collection. Spleen: Normal size. No mass. Adrenals/Urinary Tract: No adrenal masses. No renal mass. No hydronephrosis or nephrolithiasis. The urinary bladder is distended without focal abnormality. Stomach/Bowel: The stomach is decompressed without focal abnormality. No small bowel wall thickening or inflammation. No small bowel obstruction.Normal appendix. Sigmoid colonic diverticulosis. No changes of acute diverticulitis. Lymphatic: No intraabdominal or pelvic lymphadenopathy. Reproductive: Age-related atrophy of the uterus and ovaries. No concerning adnexal mass.No free pelvic fluid. Other: No pneumoperitoneum, ascites, or mesenteric inflammation. Musculoskeletal: No acute fracture or destructive lesion.Diffuse osteopenia. Both hip arthroplasties are anatomically aligned without dislocation. Multilevel degenerative disc disease of the spine. Posterior fusion and laminectomy changes in the lumbar spine moderate grade 2 anterolisthesis of L5 on S1. IMPRESSION: VASCULAR 1. No aortic aneurysm, intramural hematoma, or aortic dissection. 2. 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 Fusiform ectasia of the mid and distal left common iliac artery measuring 2 cm. No adjacent inflammation or fluid to suggest rupture. NON-VASCULAR 1. No additional, acute findings within the abdomen or pelvis. 2. Sigmoid diverticulosis.  No changes of acute diverticulitis. 3. Moderate volume bilateral pleural effusions with bibasilar compressive atelectasis. 4. In the medial right middle lobe, there is a 6 mm lung nodule. Comparison with outside imaging is recommended to document stability. Aortic  Atherosclerosis (ICD10-I70.0). Electronically Signed   By: Rogelia Myers M.D.   On: 01/13/2024 16:06   ECHOCARDIOGRAM COMPLETE Result Date: 01/13/2024    ECHOCARDIOGRAM REPORT   Patient Name:   Sydney [name] Date of Exam: 01/13/2024 Medical Rec #:  [id number]     Height:       66.0 in Accession #:    [id number]    Weight:       155.0 lb Date of Birth:  07/23/1932     BSA:          1.794 m Patient Age:    89 years      BP:           146/92 mmHg Patient Gender: F             HR:           94 bpm. Exam Location:  Inpatient Procedure: 2D Echo, Cardiac Doppler, Color Doppler and Intracardiac  Opacification Agent (Both Spectral and Color Flow Doppler were            utilized during procedure). Indications:     Atrial fibrillation I48.91  History:         Patient has no prior history of Echocardiogram examinations.                  H/O Ascending aortic aneurysm, Respiratory distress, Elevated                  troponin, Hypertension, Hyperlipidemia.  Sonographer:     BERNARDA ROCKS Referring Phys:  [id number] RONDELL A SMITH Diagnosing Phys: [name] MD IMPRESSIONS  1. Left ventricular ejection fraction, by estimation, is 20 to 25%. The left ventricle has severely decreased function. The left ventricle demonstrates global hypokinesis. The left ventricular internal cavity size was mildly dilated. Left ventricular diastolic parameters are consistent with Grade I diastolic dysfunction (impaired relaxation).  2. Right ventricular systolic function is normal. The right ventricular size is normal. There is normal pulmonary artery systolic pressure.  3. Left atrial size was moderately dilated.  4. The mitral valve is degenerative. Moderate mitral valve regurgitation. No evidence of mitral stenosis.  5. The aortic valve is tricuspid. There is mild calcification of the aortic valve. Aortic valve regurgitation is trivial.  6. The inferior vena cava is normal in size with <50% respiratory variability, suggesting right  atrial pressure of 8 mmHg. Conclusion(s)/Recommendation(s): Findings consistent with dilated cardiomyopathy and ischemic cardiomyopathy. FINDINGS  Left Ventricle: Left ventricular ejection fraction, by estimation, is 20 to 25%. The left ventricle has severely decreased function. The left ventricle demonstrates global hypokinesis. The left ventricular internal cavity size was mildly dilated. There is no left ventricular hypertrophy. Left ventricular diastolic parameters are consistent with Grade I diastolic dysfunction (impaired relaxation). Right Ventricle: The right ventricular size is normal. No increase in right ventricular wall thickness. Right ventricular systolic function is normal. There is normal pulmonary artery systolic pressure. The tricuspid regurgitant velocity is 2.79 m/s, and  with an assumed right atrial pressure of 3 mmHg, the estimated right ventricular systolic pressure is 34.1 mmHg. Left Atrium: Left atrial size was moderately dilated. Right Atrium: Right atrial size was normal in size. Pericardium: There is no evidence of pericardial effusion. Mitral Valve: The mitral valve is degenerative in appearance. There is mild calcification of the posterior mitral valve leaflet(s). Mild mitral annular calcification. Moderate mitral valve regurgitation. No evidence of mitral valve stenosis. MV peak gradient, 4.4 mmHg. The mean mitral valve gradient is 2.0 mmHg. Tricuspid Valve: The tricuspid valve is normal in structure. Tricuspid valve regurgitation is mild. Aortic Valve: The aortic valve is tricuspid. There is mild calcification of the aortic valve. There is mild aortic valve annular calcification. Aortic valve regurgitation is trivial. Aortic regurgitation PHT measures 764 msec. Aortic valve mean gradient measures 5.0 mmHg. Aortic valve peak gradient measures 9.2 mmHg. Aortic valve area, by VTI measures 2.20 cm. Pulmonic Valve: The pulmonic valve was normal in structure. Pulmonic valve regurgitation  is trivial. Aorta: The aortic root is normal in size and structure. There is minimal (Grade I) atheroma plaque involving the aortic root and ascending aorta. Venous: The inferior vena cava is normal in size with less than 50% respiratory variability, suggesting right atrial pressure of 8 mmHg. IAS/Shunts: The atrial septum is grossly normal.  LEFT VENTRICLE PLAX 2D LVIDd:         5.50 cm      Diastology LVIDs:  5.10 cm      LV e' medial:    8.59 cm/s LV PW:         0.70 cm      LV E/e' medial:  8.6 LV IVS:        0.90 cm      LV e' lateral:   10.00 cm/s LVOT diam:     2.00 cm      LV E/e' lateral: 7.4 LV SV:         55 LV SV Index:   30 LVOT Area:     3.14 cm  LV Volumes (MOD) LV vol d, MOD A2C: 166.0 ml LV vol d, MOD A4C: 197.0 ml LV vol s, MOD A2C: 126.0 ml LV vol s, MOD A4C: 152.0 ml LV SV MOD A2C:     40.0 ml LV SV MOD A4C:     197.0 ml LV SV MOD BP:      41.2 ml RIGHT VENTRICLE             IVC RV Basal diam:  2.80 cm     IVC diam: 1.20 cm RV S prime:     18.20 cm/s TAPSE (M-mode): 1.8 cm RVSP:           34.1 mmHg LEFT ATRIUM             Index        RIGHT ATRIUM           Index LA diam:        3.20 cm 1.78 cm/m   RA Pressure: 3.00 mmHg LA Vol (A2C):   63.2 ml 35.22 ml/m  RA Area:     12.40 cm LA Vol (A4C):   55.1 ml 30.71 ml/m  RA Volume:   28.90 ml  16.10 ml/m LA Biplane Vol: 60.1 ml 33.49 ml/m  AORTIC VALVE                    PULMONIC VALVE AV Area (Vmax):    2.29 cm     PV Vmax:          0.96 m/s AV Area (Vmean):   1.91 cm     PV Peak grad:     3.7 mmHg AV Area (VTI):     2.20 cm     PR End Diast Vel: 2.51 msec AV Vmax:           152.00 cm/s AV Vmean:          99.600 cm/s AV VTI:            0.248 m AV Peak Grad:      9.2 mmHg AV Mean Grad:      5.0 mmHg LVOT Vmax:         111.00 cm/s LVOT Vmean:        60.500 cm/s LVOT VTI:          0.174 m LVOT/AV VTI ratio: 0.70 AI PHT:            764 msec  AORTA Ao Root diam: 3.20 cm Ao Asc diam:  4.00 cm MITRAL VALVE                  TRICUSPID VALVE MV  Area (PHT): 3.95 cm       TR Peak grad:   31.1 mmHg MV Area VTI:   2.06 cm       TR Vmax:        279.00 cm/s  MV Peak grad:  4.4 mmHg       Estimated RAP:  3.00 mmHg MV Mean grad:  2.0 mmHg       RVSP:           34.1 mmHg MV Vmax:       1.05 m/s MV Vmean:      62.6 cm/s      SHUNTS MV Decel Time: 192 msec       Systemic VTI:  0.17 m MR Peak grad:    139.2 mmHg   Systemic Diam: 2.00 cm MR Mean grad:    80.0 mmHg MR Vmax:         590.00 cm/s MR Vmean:        410.0 cm/s MR PISA:         3.08 cm MR PISA Eff ROA: 20 mm MR PISA Radius:  0.70 cm MV E velocity: 73.70 cm/s MV A velocity: 124.00 cm/s MV E/A ratio:  0.59 [name] MD Electronically signed by [name] MD Signature Date/Time: 01/13/2024/3:44:34 PM    Final    DG Chest Port 1 View Result Date: 01/13/2024 CLINICAL DATA:  Cough, shortness of breath. EXAM: PORTABLE CHEST 1 VIEW COMPARISON:  September 21, 2008. FINDINGS: Stable cardiomediastinal silhouette. Bilateral interstitial densities are noted concerning for pulmonary edema with probable small bilateral pleural effusions. Bony thorax is unremarkable. IMPRESSION: Bilateral interstitial densities are noted concerning for pulmonary edema with probable small bilateral pleural effusions. Electronically Signed   By: Lynwood Landy Raddle M.D.   On: 01/13/2024 08:47     LOS: 1 day   Donalda Applebaum, MD  [facility]    To contact the attending provider between 7A-7P or the covering provider during after hours 7P-7A, please log into the web site [email or web address] and access using universal [place] password for that web site. If you do not have the password, please call the hospital operator.  01/14/2024, 10:26 AM

## 2024-01-14 NOTE — Telephone Encounter (Signed)
 Patient Product/process development scientist completed.    The patient is insured through [employer]. Patient has Medicare and is not eligible for a copay card, but may be able to apply for patient assistance or Medicare RX Payment Plan (Patient Must reach out to their plan, if eligible for payment plan), if available.    Ran test claim for Eliquis 5 mg and the current 30 day co-pay is $40.00.   This test claim was processed through [facility]- copay amounts may vary at other pharmacies due to pharmacy/plan contracts, or as the patient moves through the different stages of their insurance plan.     Roland Earl, CPHT Pharmacy Technician III Certified Patient Advocate [facility] Patient Advocate Team Direct Number: [phone]  Fax: [phone]

## 2024-01-14 NOTE — Progress Notes (Signed)
 PHARMACY - ANTICOAGULATION CONSULT NOTE  Pharmacy Consult for heparin Indication: chest pain/ACS and atrial fibrillation  Allergies  Allergen Reactions   Lipitor [Atorvastatin ]     Dizzy ( fuzziness)   Latex Rash   Patient Measurements: Height: 5' 6 (167.6 cm) Weight: 70.3 kg (155 lb) IBW/kg (Calculated) : 59.3 HEPARIN DW (KG): 70.3  Vital Signs: Temp: 97.6 F (36.4 C) (10/02 0428) Temp Source: Oral (10/02 0428) BP: 151/83 (10/02 0428) Pulse Rate: 86 (10/01 2035)  Labs: Recent Labs    01/13/24 0730 01/13/24 0811 01/13/24 1032 01/13/24 1918 01/14/24 0257  HGB 15.6* 16.0*  --   --  13.8  HCT 48.3* 47.0*  --   --  42.4  PLT 196  --   --   --  172  HEPARINUNFRC  --   --   --  0.22* 0.40  CREATININE 1.14*  --   --   --  1.13*  TROPONINIHS 158*  --  889*  --   --    Estimated Creatinine Clearance: 31.6 mL/min (A) (by C-G formula based on SCr of 1.13 mg/dL (H)).   Assessment: 28 YOF presenting from home with respiratory distress and elevated troponins. Patient also with new afib. No history of anticoagulation PTA documented. Pharmacy has been consulted to dose heparin for elevated troponins/ACS and new onset atrial fibrillation.   Heparin level therapeutic   Goal of Therapy:  Heparin level 0.3-0.7 units/ml Monitor platelets by anticoagulation protocol: Yes   Plan:  Continue heparin at 1150 units / hr Daily heparin level, CBC  Thank you. Olam Monte, PharmD 01/14/2024, 8:00 AM

## 2024-01-14 NOTE — TOC Initial Note (Addendum)
 Transition of Care [facility]) - Initial/Assessment Note    Patient Details  Name: Sydney [name] MRN: [id number] Date of Birth: 1932-07-18  Transition of Care [facility] [place] CM/SW Contact:    Rosalva Jon Bloch, RN Phone Number: 01/14/2024, 10:44 AM  Clinical Narrative:                 Acute respiratory failure with hypoxia,? new onset heart failure,RSV infection. From home alone. Supportive daughter and son ( lives in back of pt's home). PTA independent with ADL's. Owns DME: RW, cane.  Pt without transportation or RX med concerns. PCP confirmed, Charlie Denise MD.  Benefits check requested for Eliquis.  PT/OT evaluations pending. Pt states agreeable to home health services if needed. Provider preference : [facility]. Referral made with Kelly/ [facility] and accepted pending MD's order.  IP CM  following and will assist with needs.  01/14/2024 @ 1111  Per pharmacy, copay for Eliquis  will be $ 40.00.   Expected Discharge Plan: Home/Self Care Barriers to Discharge: Continued Medical Work up   Patient Goals and CMS Choice            Expected Discharge Plan and Services   Discharge Planning Services: CM Consult   Living arrangements for the past 2 months: Single Family Home                                      Prior Living Arrangements/Services Living arrangements for the past 2 months: Single Family Home Lives with:: Self Patient language and need for interpreter reviewed:: Yes Do you feel safe going back to the place where you live?: Yes      Need for Family Participation in Patient Care: Yes (Comment) Care giver support system in place?: Yes (comment) Current home services: DME (cane, RW) Criminal Activity/Legal Involvement Pertinent to Current Situation/Hospitalization: No - Comment as needed  Activities of Daily Living      Permission Sought/Granted                  Emotional Assessment       Orientation: : Oriented to Self, Oriented to Place,  Oriented to  Time, Oriented to Situation Alcohol / Substance Use: Not Applicable Psych Involvement: No (comment)  Admission diagnosis:  RSV (acute bronchiolitis due to respiratory syncytial virus) [J21.0] RSV (respiratory syncytial virus infection) [B33.8] New onset a-fib (HCC) [I48.91] Viral pneumonia [J12.9] Patient Active Problem List   Diagnosis Date Noted   RSV (respiratory syncytial virus infection) 01/13/2024   New onset of congestive heart failure (HCC) 01/13/2024   Acute respiratory failure with hypoxia (HCC) 01/13/2024   Elevated troponin 01/13/2024   New onset atrial fibrillation (HCC) 01/13/2024   Lactic acidosis 01/13/2024   Chronic venous insufficiency 07/24/2021   Essential hypertension    Tinnitus 08/08/2020   Stage 3a chronic kidney disease (HCC) 03/30/2020   Osteoporosis 09/02/2019   Polymyalgia rheumatica 06/12/2016   Hyperlipemia 07/23/2015   Generalized osteoarthritis of multiple sites 07/14/2014   Advanced directives, counseling/discussion 12/02/2013   Routine general medical examination at a health care facility 11/30/2012   AAA (abdominal aortic aneurysm) 12/23/2011   GERD (gastroesophageal reflux disease)    Iliac artery aneurysm, right 01/18/2011   DEGENERATIVE DISC DISEASE, LUMBAR SPINE 01/02/2009   Essential hypertension, benign 01/14/2007   PCP:  Denise [name], MD Pharmacy:   [id number] - [place], [place] - [address] AT  [facility] [address] [place] [address] Phone: [phone] Fax: [phone]     Social Drivers of Health (SDOH) Social History: SDOH Screenings   Depression (PHQ2-9): Low Risk  (07/01/2023)  Tobacco Use: Low Risk  (01/13/2024)   SDOH Interventions:     Readmission Risk Interventions     No data to display

## 2024-01-14 NOTE — Evaluation (Signed)
 Physical Therapy Evaluation Patient Details Name: Sydney [name] MRN: [id number] DOB: Apr 30, 1932 Today's Date: 01/14/2024  History of Present Illness  Pt is an 89 y.o. female admitted 10/1 with RSV. On admission pt with primary c/o coughing and difficulty breathing. PMH:  HTN, hyperlipidemia, polymyalgia rheumatica, GERD, and AAA  Clinical Impression  Pt admitted with above diagnosis. PTA pt lived at home alone, mod I mobility with RW vs cane. Family lives close by and assists as needed. Pt currently with functional limitations due to the deficits listed below (see PT Problem List). On eval, pt required min assist supine to sit, CGA sit to stand, CGA amb 50' with RW, and supervision sit to supine. Maintained SpO2 in 90s on RA during mobility. No SOB/DOE noted. No coughing. Pt will benefit from acute skilled PT to increase their independence and safety with mobility to allow discharge.  PT to follow acutely. No follow up services indicated.         If plan is discharge home, recommend the following: Assistance with cooking/housework;Assist for transportation;Help with stairs or ramp for entrance   Can travel by private vehicle        Equipment Recommendations None recommended by PT  Recommendations for Other Services       Functional Status Assessment Patient has had a recent decline in their functional status and demonstrates the ability to make significant improvements in function in a reasonable and predictable amount of time.     Precautions / Restrictions Precautions Precautions: Fall Recall of Precautions/Restrictions: Intact      Mobility  Bed Mobility Overal bed mobility: Needs Assistance Bed Mobility: Supine to Sit, Sit to Supine     Supine to sit: Min assist, Used rails, HOB elevated Sit to supine: Supervision   General bed mobility comments: assist with trunk OOB, no physical assist for return to bed    Transfers Overall transfer level: Needs  assistance Equipment used: Rolling walker (2 wheels) Transfers: Sit to/from Stand Sit to Stand: Contact guard assist           General transfer comment: for safety/lines, no physical assist. Pt tends to pull up on RW.    Ambulation/Gait Ambulation/Gait assistance: Contact guard assist Gait Distance (Feet): 50 Feet Assistive device: Rolling walker (2 wheels) Gait Pattern/deviations: Step-through pattern Gait velocity: WFL     General Gait Details: Pt amb in room only due to mask requirement in hallway (for her dx). Maintained SpO2 in 90s on RA. No SOB/DOE noted.  Stairs            Wheelchair Mobility     Tilt Bed    Modified Rankin (Stroke Patients Only)       Balance Overall balance assessment: Mild deficits observed, not formally tested                                           Pertinent Vitals/Pain Pain Assessment Pain Assessment: No/denies pain    Home Living Family/patient expects to be discharged to:: Private residence Living Arrangements: Alone Available Help at Discharge: Family;Available PRN/intermittently Type of Home: House Home Access: Stairs to enter Entrance Stairs-Rails: Left Entrance Stairs-Number of Steps: 3   Home Layout: One level Home Equipment: Cane - single point;Rollator (4 wheels);Shower seat Additional Comments: Pt son lives in separate house on same property. Pt aslo has good support from her granddaughter.    Prior Function  Prior Level of Function : Needs assist             Mobility Comments: mod I mobility with cane vs RW in house and community. Son assists on steps in/out of house. ADLs Comments: mod I basic ADLs. Family does cooking, cleaning, driving, and grocery shopping.     Extremity/Trunk Assessment   Upper Extremity Assessment Upper Extremity Assessment: Overall WFL for tasks assessed    Lower Extremity Assessment Lower Extremity Assessment: Overall WFL for tasks assessed    Cervical  / Trunk Assessment Cervical / Trunk Assessment: Kyphotic  Communication   Communication Communication: No apparent difficulties    Cognition Arousal: Alert Behavior During Therapy: WFL for tasks assessed/performed   PT - Cognitive impairments: No apparent impairments                       PT - Cognition Comments: Pt can be impulsive and tends to move fast. Following commands: Intact       Cueing Cueing Techniques: Verbal cues     General Comments General comments (skin integrity, edema, etc.): VSS on RA    Exercises     Assessment/Plan    PT Assessment Patient needs continued PT services  PT Problem List Cardiopulmonary status limiting activity;Decreased activity tolerance;Decreased mobility       PT Treatment Interventions Therapeutic exercise;Gait training;Balance training;Stair training;Functional mobility training;Therapeutic activities;Patient/family education    PT Goals (Current goals can be found in the Care Plan section)  Acute Rehab PT Goals Patient Stated Goal: home PT Goal Formulation: With patient/family Time For Goal Achievement: 01/28/24 Potential to Achieve Goals: Good    Frequency Min 2X/week     Co-evaluation               AM-PAC PT 6 Clicks Mobility  Outcome Measure Help needed turning from your back to your side while in a flat bed without using bedrails?: None Help needed moving from lying on your back to sitting on the side of a flat bed without using bedrails?: A Little Help needed moving to and from a bed to a chair (including a wheelchair)?: A Little Help needed standing up from a chair using your arms (e.g., wheelchair or bedside chair)?: A Little Help needed to walk in hospital room?: A Little Help needed climbing 3-5 steps with a railing? : A Little 6 Click Score: 19    End of Session Equipment Utilized During Treatment: Gait belt Activity Tolerance: Patient tolerated treatment well Patient left: in bed;with  call bell/phone within reach;with family/visitor present Nurse Communication: Mobility status PT Visit Diagnosis: Difficulty in walking, not elsewhere classified (R26.2)    Time: 8857-8795 PT Time Calculation (min) (ACUTE ONLY): 22 min   Charges:   PT Evaluation $PT Eval Low Complexity: 1 Low   PT General Charges $$ ACUTE PT VISIT: 1 Visit         Sari MATSU., PT  Office # [phone]   Erven Sari Shaker 01/14/2024, 12:21 PM

## 2024-01-15 ENCOUNTER — Telehealth: Payer: Self-pay

## 2024-01-15 ENCOUNTER — Other Ambulatory Visit (HOSPITAL_COMMUNITY): Payer: Self-pay

## 2024-01-15 DIAGNOSIS — J210 Acute bronchiolitis due to respiratory syncytial virus: Secondary | ICD-10-CM | POA: Diagnosis not present

## 2024-01-15 DIAGNOSIS — I509 Heart failure, unspecified: Secondary | ICD-10-CM | POA: Diagnosis not present

## 2024-01-15 DIAGNOSIS — J9601 Acute respiratory failure with hypoxia: Secondary | ICD-10-CM | POA: Diagnosis not present

## 2024-01-15 DIAGNOSIS — I4891 Unspecified atrial fibrillation: Secondary | ICD-10-CM | POA: Diagnosis not present

## 2024-01-15 LAB — HEPARIN LEVEL (UNFRACTIONATED): Heparin Unfractionated: 0.43 [IU]/mL (ref 0.30–0.70)

## 2024-01-15 LAB — CBC
HCT: 41.1 % (ref 36.0–46.0)
Hemoglobin: 13.4 g/dL (ref 12.0–15.0)
MCH: 30.3 pg (ref 26.0–34.0)
MCHC: 32.6 g/dL (ref 30.0–36.0)
MCV: 93 fL (ref 80.0–100.0)
Platelets: 177 K/uL (ref 150–400)
RBC: 4.42 MIL/uL (ref 3.87–5.11)
RDW: 13.1 % (ref 11.5–15.5)
WBC: 4.5 K/uL (ref 4.0–10.5)
nRBC: 0 % (ref 0.0–0.2)

## 2024-01-15 MED ORDER — APIXABAN 2.5 MG PO TABS
2.5000 mg | ORAL_TABLET | Freq: Two times a day (BID) | ORAL | Status: DC
Start: 2024-01-15 — End: 2024-01-15

## 2024-01-15 MED ORDER — APIXABAN 5 MG PO TABS
5.0000 mg | ORAL_TABLET | Freq: Two times a day (BID) | ORAL | 2 refills | Status: AC
  Filled 2024-01-15: qty 180, 90d supply, fill #0

## 2024-01-15 MED ORDER — EZETIMIBE 10 MG PO TABS
10.0000 mg | ORAL_TABLET | Freq: Every day | ORAL | 2 refills | Status: AC
  Filled 2024-01-15: qty 90, 90d supply, fill #0

## 2024-01-15 MED ORDER — GUAIFENESIN-DM 100-10 MG/5ML PO SYRP: 5.0000 mL | ORAL_SOLUTION | ORAL | Status: DC | PRN

## 2024-01-15 MED ORDER — APIXABAN 5 MG PO TABS
5.0000 mg | ORAL_TABLET | Freq: Two times a day (BID) | ORAL | Status: DC
  Administered 2024-01-15: 5 mg via ORAL
  Filled 2024-01-15: qty 1

## 2024-01-15 MED ORDER — LOSARTAN POTASSIUM 25 MG PO TABS
25.0000 mg | ORAL_TABLET | Freq: Every day | ORAL | 2 refills | Status: DC
  Filled 2024-01-15: qty 90, 90d supply, fill #0

## 2024-01-15 MED ORDER — EZETIMIBE 10 MG PO TABS
10.0000 mg | ORAL_TABLET | Freq: Every day | ORAL | Status: DC
  Administered 2024-01-15: 10 mg via ORAL
  Filled 2024-01-15: qty 1

## 2024-01-15 MED ORDER — CARVEDILOL 6.25 MG PO TABS
6.2500 mg | ORAL_TABLET | Freq: Two times a day (BID) | ORAL | 2 refills | Status: AC
  Filled 2024-01-15: qty 180, 90d supply, fill #0

## 2024-01-15 MED ORDER — CARVEDILOL 6.25 MG PO TABS
6.2500 mg | ORAL_TABLET | Freq: Two times a day (BID) | ORAL | Status: DC
Start: 2024-01-15 — End: 2024-01-15
  Administered 2024-01-15: 6.25 mg via ORAL
  Filled 2024-01-15: qty 1

## 2024-01-15 MED ORDER — PANTOPRAZOLE SODIUM 40 MG PO TBEC
40.0000 mg | DELAYED_RELEASE_TABLET | Freq: Every day | ORAL | 3 refills | Status: DC
  Filled 2024-01-15: qty 90, 90d supply, fill #0

## 2024-01-15 NOTE — Telephone Encounter (Signed)
 Copied from CRM #[id number]. Topic: Referral - Question >> Jan 15, 2024  2:48 PM Pinkey ORN wrote: Reason for CRM: Referral Request >> Jan 15, 2024  2:49 PM Pinkey ORN wrote: Patient has been discharged from the hospital on 01/15/24 and was advised by the hospital to see a Cardiologist. Patient is requesting to be referred over to one.

## 2024-01-15 NOTE — Discharge Summary (Signed)
 PATIENT DETAILS Name: Sydney [name] Age: 89 y.o. Sex: female Date of Birth: February 06, 1933 MRN: [id number]. Admitting Physician: Maximino DELENA Sharps, MD ERE:Ozucjx, [name], MD  Admit Date: 01/13/2024 Discharge date: 01/15/2024  Recommendations for Outpatient Follow-up:  Follow up with PCP in 1-2 weeks Please obtain CMP/CBC in one week Please ensure follow-up with cardiology. Please see below regarding incidental finding/lung nodule/possible pseudoaneurysm of right common iliac artery.  Admitted From:  Home  Disposition: Home   Discharge Condition: good  CODE STATUS:   Code Status: Do not attempt resuscitation (DNR) PRE-ARREST INTERVENTIONS DESIRED   Diet recommendation:  Diet Order             Diet - low sodium heart healthy           Diet regular Room service appropriate? Yes; Fluid consistency: Thin  Diet effective now                    Brief Summary: Patient is a 89 y.o.  female with history of PMR, GERD, HTN, HLD-who presented with cough/congestion and worsening shortness of breath-patient was found to have RSV infection-acute pulm edema in the setting of new onset HFrEF along with A-fib RVR.   Significant events: 10/1>> admit to [facility]   Significant studies: 10/1>> CT angio abdomen/pelvis: No dissection/aneurysm, focal pseudoaneurysm from right common iliac artery. 10/1>> echo: EF 20-25%, grade 1 diastolic dysfunction   Significant microbiology data: 10/1>> RSV PCR: Positive 10/1>> COVID/influenza: Negative 10/1> blood culture: Negative   Procedures: None   Consults: Cardiology  Brief Hospital Course: Acute hypoxic respiratory failure secondary to acute pulmonary edema due to new onset acute HFrEF Hypoxia resolved with only 1-2 doses of Lasix  On room air-and euvolemic.   New onset acute HFrEF Unclear if this is ischemic etiology or viral myocarditis Cardiology recommending medical management given advanced age Volume status stable-not require  diuretics since her initial hospitalization. Tolerating low-dose beta-blocker and losartan Discussed with cardiologist-Dr. Mason will initiate GDMT meds like Entresto etc. in the outpatient setting when she follows with him in the office.    Non-STEMI No further chest pain Medical management planned per cardiology-no plans of doing LHC. Pleated IV heparin x 48 hours On beta-blocker Intolerance to statin in the past-Will be started on Zetia per cardiology.  PAF with RVR Has spontaneously converted back to sinus rhythm Continue Coreg Initially on IV heparin-has been transition to Eliquis per pharmacy.  Await cardiology input   HTN BP stable Coreg/losartan   GERD PPI   Lactic acidosis Secondary to hypoperfusion in the setting of CHF-sepsis ruled out.   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   6 millimeter right lung nodule Incidental finding on CT imaging Stable for outpatient follow-up with PCP for further workup  Discharge Diagnoses:  Principal Problem:   RSV (respiratory syncytial virus infection) Active Problems:   New onset of congestive heart failure (HCC)   Acute respiratory failure with hypoxia (HCC)   Elevated troponin   New onset atrial fibrillation (HCC)   Lactic acidosis   Essential hypertension   GERD (gastroesophageal reflux disease)   AAA (abdominal aortic aneurysm)   Discharge Instructions:  Activity:  As tolerated with Full fall precautions use walker/cane & assistance as needed  Discharge Instructions     Call MD for:  difficulty breathing, headache or visual disturbances   Complete by: As directed    Diet - low sodium heart healthy   Complete by:  As directed    Discharge instructions   Complete by: As directed    Follow with Primary MD  [name], MD in 1-2 weeks  Follow-up with Dr. Antonetta office will call you with a follow-up appointment-if you do not  hear from them-please give them a call.  Please get a complete blood count and chemistry panel checked by your Primary MD at your next visit, and again as instructed by your Primary MD.  Get Medicines reviewed and adjusted: Please take all your medications with you for your next visit with your Primary MD  Laboratory/radiological data: Please request your Primary MD to go over all hospital tests and procedure/radiological results at the follow up, please ask your Primary MD to get all Hospital records sent to his/her office.  In some cases, they will be blood work, cultures and biopsy results pending at the time of your discharge. Please request that your primary care M.D. follows up on these results.  Also Note the following: If you experience worsening of your admission symptoms, develop shortness of breath, life threatening emergency, suicidal or homicidal thoughts you must seek medical attention immediately by calling 911 or calling your MD immediately  if symptoms less severe.  You must read complete instructions/literature along with all the possible adverse reactions/side effects for all the Medicines you take and that have been prescribed to you. Take any new Medicines after you have completely understood and accpet all the possible adverse reactions/side effects.   Do not drive when taking Pain medications or sleeping medications (Benzodaizepines)  Do not take more than prescribed Pain, Sleep and Anxiety Medications. It is not advisable to combine anxiety,sleep and pain medications without talking with your primary care practitioner  Special Instructions: If you have smoked or chewed Tobacco  in the last 2 yrs please stop smoking, stop any regular Alcohol  and or any Recreational drug use.  Wear Seat belts while driving.  Please note: You were cared for by a hospitalist during your hospital stay. Once you are discharged, your primary care physician will handle any further medical  issues. Please note that NO REFILLS for any discharge medications will be authorized once you are discharged, as it is imperative that you return to your primary care physician (or establish a relationship with a primary care physician if you do not have one) for your post hospital discharge needs so that they can reassess your need for medications and monitor your lab values.   Increase activity slowly   Complete by: As directed       Allergies as of 01/15/2024       Reactions   Lipitor [atorvastatin ]    Dizzy ( fuzziness)   Latex Rash        Medication List     TAKE these medications    acetaminophen  325 MG tablet Commonly known as: TYLENOL  Take 325 mg by mouth daily as needed for mild pain (pain score 1-3) or moderate pain (pain score 4-6).   apixaban 5 MG Tabs tablet Commonly known as: ELIQUIS Take 1 tablet (5 mg total) by mouth 2 (two) times daily.   carvedilol 6.25 MG tablet Commonly known as: COREG Take 1 tablet (6.25 mg total) by mouth 2 (two) times daily with a meal.   ezetimibe 10 MG tablet Commonly known as: ZETIA Take 1 tablet (10 mg total) by mouth daily. Start taking on: January 16, 2024   losartan 25 MG tablet Commonly known as: COZAAR Take 1 tablet (25  mg total) by mouth daily. Start taking on: January 16, 2024   pantoprazole  40 MG tablet Commonly known as: PROTONIX  Take 1 tablet (40 mg total) by mouth daily.        Follow-up Information     [name], MD Follow up.   Specialties: Internal Medicine, Pediatrics Contact information: [address] [place] [address] [phone]         [facility] Follow up.   Specialty: Home Health Services Why: They will call you  to set up services Contact information: [address] [place] [address] [phone]         Claudene Pacific, MD. Schedule an appointment as soon as possible for a visit in 2 week(s).   Specialty: Cardiology Contact  information: [address] [place] [address] [phone]                Allergies  Allergen Reactions   Lipitor [Atorvastatin ]     Dizzy ( fuzziness)   Latex Rash     Other Procedures/Studies: CT Angio Abd/Pel w/ and/or w/o Result Date: 01/13/2024 CLINICAL DATA:  Abdominal aortic aneurysm (AAA), follow up Patient coming in with new onset atrial fibrillation and history of AAA and shortness of breath EXAM: CTA ABDOMEN AND PELVIS WITHOUT AND WITH CONTRAST TECHNIQUE: Multidetector CT imaging of the abdomen and pelvis was performed using the standard protocol during bolus administration of intravenous contrast. Multiplanar reconstructed images and MIPs were obtained and reviewed to evaluate the vascular anatomy. RADIATION DOSE REDUCTION: This exam was performed according to the departmental dose-optimization program which includes automated exposure control, adjustment of the mA and/or kV according to patient size and/or use of iterative reconstruction technique. CONTRAST:  75mL OMNIPAQUE  IOHEXOL  350 MG/ML SOLN COMPARISON:  None Available. FINDINGS: VASCULAR Aorta: No aortic aneurysm, intramural hematoma, or aortic dissection. Diffuse calcified noncalcified atherosclerosis throughout the aorta resulting in mild stenosis throughout. Celiac: Patent without acute thrombus, aneurysm, or dissection.No hemodynamically significant stenosis. SMA: Patent without acute thrombus, aneurysm, or dissection.No hemodynamically significant stenosis. Renals: Patent without acute thrombus, aneurysm, or dissection.Moderate to severe narrowing of the right renal artery from mixed density plaque at the ostium. IMA: Patent without acute thrombus, aneurysm, or dissection.Severe narrowing of the IMA ostium from noncalcified plaque. There is a focal, noncalcified moderate stenosis just downstream from the ostium. Inflow: Focal pseudoaneurysm arising from the posterior right common iliac artery measuring 1.6 cm  (axial 137). Fusiform ectasia of the mid and distal left common iliac artery measuring 2 cm.Diffusely calcified and noncalcified plaque throughout the common iliac arteries causing moderate diffuse stenosis on the left. The external iliac arteries are widely patent bilaterally. Proximal Outflow: The bilateral common femoral and visualized portions of the superficial and profunda femoral arteries are patent without acute thrombus, aneurysm, or dissection.No hemodynamically significant stenosis. Veins: No obvious venous abnormality within the limitations of this arterial phase study. Review of the MIP images confirms the above findings. NON-VASCULAR Lower chest: Moderate volume bilateral pleural effusions with dense airspace consolidation in both lower lobes, likely atelectasis.6 mm right middle lobe nodule (axial 16). Hepatobiliary: No mass.No radiopaque stones or wall thickening of the gallbladder.No intrahepatic or extrahepatic biliary ductal dilation. Pancreas: No mass or main ductal dilation.No peripancreatic inflammation or fluid collection. Spleen: Normal size. No mass. Adrenals/Urinary Tract: No adrenal masses. No renal mass. No hydronephrosis or nephrolithiasis. The urinary bladder is distended without focal abnormality. Stomach/Bowel: The stomach is decompressed without focal abnormality. No small bowel  wall thickening or inflammation. No small bowel obstruction.Normal appendix. Sigmoid colonic diverticulosis. No changes of acute diverticulitis. Lymphatic: No intraabdominal or pelvic lymphadenopathy. Reproductive: Age-related atrophy of the uterus and ovaries. No concerning adnexal mass.No free pelvic fluid. Other: No pneumoperitoneum, ascites, or mesenteric inflammation. Musculoskeletal: No acute fracture or destructive lesion.Diffuse osteopenia. Both hip arthroplasties are anatomically aligned without dislocation. Multilevel degenerative disc disease of the spine. Posterior fusion and laminectomy changes  in the lumbar spine moderate grade 2 anterolisthesis of L5 on S1. IMPRESSION: VASCULAR 1. No aortic aneurysm, intramural hematoma, or aortic dissection. 2. 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 Fusiform ectasia of the mid and distal left common iliac artery measuring 2 cm. No adjacent inflammation or fluid to suggest rupture. NON-VASCULAR 1. No additional, acute findings within the abdomen or pelvis. 2. Sigmoid diverticulosis.  No changes of acute diverticulitis. 3. Moderate volume bilateral pleural effusions with bibasilar compressive atelectasis. 4. In the medial right middle lobe, there is a 6 mm lung nodule. Comparison with outside imaging is recommended to document stability. Aortic Atherosclerosis (ICD10-I70.0). Electronically Signed   By: Rogelia Myers M.D.   On: 01/13/2024 16:06   ECHOCARDIOGRAM COMPLETE Result Date: 01/13/2024    ECHOCARDIOGRAM REPORT   Patient Name:   Sydney [name] Date of Exam: 01/13/2024 Medical Rec #:  [id number]     Height:       66.0 in Accession #:    [id number]    Weight:       155.0 lb Date of Birth:  June 21, 1932     BSA:          1.794 m Patient Age:    89 years      BP:           146/92 mmHg Patient Gender: F             HR:           94 bpm. Exam Location:  Inpatient Procedure: 2D Echo, Cardiac Doppler, Color Doppler and Intracardiac            Opacification Agent (Both Spectral and Color Flow Doppler were            utilized during procedure). Indications:     Atrial fibrillation I48.91  History:         Patient has no prior history of Echocardiogram examinations.                  H/O Ascending aortic aneurysm, Respiratory distress, Elevated                  troponin, Hypertension, Hyperlipidemia.  Sonographer:     BERNARDA ROCKS Referring Phys:  [id number] RONDELL A SMITH Diagnosing Phys: [name] MD IMPRESSIONS  1. Left ventricular ejection fraction, by estimation, is 20 to 25%. The left ventricle has  severely decreased function. The left ventricle demonstrates global hypokinesis. The left ventricular internal cavity size was mildly dilated. Left ventricular diastolic parameters are consistent with Grade I diastolic dysfunction (impaired relaxation).  2. Right ventricular systolic function is normal. The right ventricular size is normal. There is normal pulmonary artery systolic pressure.  3. Left atrial size was moderately dilated.  4. The mitral valve is degenerative. Moderate mitral valve regurgitation. No evidence of mitral stenosis.  5. The aortic valve is tricuspid. There is mild calcification of the aortic valve. Aortic valve regurgitation is trivial.  6. The inferior vena cava is normal in  size with <50% respiratory variability, suggesting right atrial pressure of 8 mmHg. Conclusion(s)/Recommendation(s): Findings consistent with dilated cardiomyopathy and ischemic cardiomyopathy. FINDINGS  Left Ventricle: Left ventricular ejection fraction, by estimation, is 20 to 25%. The left ventricle has severely decreased function. The left ventricle demonstrates global hypokinesis. The left ventricular internal cavity size was mildly dilated. There is no left ventricular hypertrophy. Left ventricular diastolic parameters are consistent with Grade I diastolic dysfunction (impaired relaxation). Right Ventricle: The right ventricular size is normal. No increase in right ventricular wall thickness. Right ventricular systolic function is normal. There is normal pulmonary artery systolic pressure. The tricuspid regurgitant velocity is 2.79 m/s, and  with an assumed right atrial pressure of 3 mmHg, the estimated right ventricular systolic pressure is 34.1 mmHg. Left Atrium: Left atrial size was moderately dilated. Right Atrium: Right atrial size was normal in size. Pericardium: There is no evidence of pericardial effusion. Mitral Valve: The mitral valve is degenerative in appearance. There is mild calcification of the  posterior mitral valve leaflet(s). Mild mitral annular calcification. Moderate mitral valve regurgitation. No evidence of mitral valve stenosis. MV peak gradient, 4.4 mmHg. The mean mitral valve gradient is 2.0 mmHg. Tricuspid Valve: The tricuspid valve is normal in structure. Tricuspid valve regurgitation is mild. Aortic Valve: The aortic valve is tricuspid. There is mild calcification of the aortic valve. There is mild aortic valve annular calcification. Aortic valve regurgitation is trivial. Aortic regurgitation PHT measures 764 msec. Aortic valve mean gradient measures 5.0 mmHg. Aortic valve peak gradient measures 9.2 mmHg. Aortic valve area, by VTI measures 2.20 cm. Pulmonic Valve: The pulmonic valve was normal in structure. Pulmonic valve regurgitation is trivial. Aorta: The aortic root is normal in size and structure. There is minimal (Grade I) atheroma plaque involving the aortic root and ascending aorta. Venous: The inferior vena cava is normal in size with less than 50% respiratory variability, suggesting right atrial pressure of 8 mmHg. IAS/Shunts: The atrial septum is grossly normal.  LEFT VENTRICLE PLAX 2D LVIDd:         5.50 cm      Diastology LVIDs:         5.10 cm      LV e' medial:    8.59 cm/s LV PW:         0.70 cm      LV E/e' medial:  8.6 LV IVS:        0.90 cm      LV e' lateral:   10.00 cm/s LVOT diam:     2.00 cm      LV E/e' lateral: 7.4 LV SV:         55 LV SV Index:   30 LVOT Area:     3.14 cm  LV Volumes (MOD) LV vol d, MOD A2C: 166.0 ml LV vol d, MOD A4C: 197.0 ml LV vol s, MOD A2C: 126.0 ml LV vol s, MOD A4C: 152.0 ml LV SV MOD A2C:     40.0 ml LV SV MOD A4C:     197.0 ml LV SV MOD BP:      41.2 ml RIGHT VENTRICLE             IVC RV Basal diam:  2.80 cm     IVC diam: 1.20 cm RV S prime:     18.20 cm/s TAPSE (M-mode): 1.8 cm RVSP:           34.1 mmHg LEFT ATRIUM  Index        RIGHT ATRIUM           Index LA diam:        3.20 cm 1.78 cm/m   RA Pressure: 3.00 mmHg LA Vol  (A2C):   63.2 ml 35.22 ml/m  RA Area:     12.40 cm LA Vol (A4C):   55.1 ml 30.71 ml/m  RA Volume:   28.90 ml  16.10 ml/m LA Biplane Vol: 60.1 ml 33.49 ml/m  AORTIC VALVE                    PULMONIC VALVE AV Area (Vmax):    2.29 cm     PV Vmax:          0.96 m/s AV Area (Vmean):   1.91 cm     PV Peak grad:     3.7 mmHg AV Area (VTI):     2.20 cm     PR End Diast Vel: 2.51 msec AV Vmax:           152.00 cm/s AV Vmean:          99.600 cm/s AV VTI:            0.248 m AV Peak Grad:      9.2 mmHg AV Mean Grad:      5.0 mmHg LVOT Vmax:         111.00 cm/s LVOT Vmean:        60.500 cm/s LVOT VTI:          0.174 m LVOT/AV VTI ratio: 0.70 AI PHT:            764 msec  AORTA Ao Root diam: 3.20 cm Ao Asc diam:  4.00 cm MITRAL VALVE                  TRICUSPID VALVE MV Area (PHT): 3.95 cm       TR Peak grad:   31.1 mmHg MV Area VTI:   2.06 cm       TR Vmax:        279.00 cm/s MV Peak grad:  4.4 mmHg       Estimated RAP:  3.00 mmHg MV Mean grad:  2.0 mmHg       RVSP:           34.1 mmHg MV Vmax:       1.05 m/s MV Vmean:      62.6 cm/s      SHUNTS MV Decel Time: 192 msec       Systemic VTI:  0.17 m MR Peak grad:    139.2 mmHg   Systemic Diam: 2.00 cm MR Mean grad:    80.0 mmHg MR Vmax:         590.00 cm/s MR Vmean:        410.0 cm/s MR PISA:         3.08 cm MR PISA Eff ROA: 20 mm MR PISA Radius:  0.70 cm MV E velocity: 73.70 cm/s MV A velocity: 124.00 cm/s MV E/A ratio:  0.59 [name] MD Electronically signed by [name] MD Signature Date/Time: 01/13/2024/3:44:34 PM    Final    DG Chest Port 1 View Result Date: 01/13/2024 CLINICAL DATA:  Cough, shortness of breath. EXAM: PORTABLE CHEST 1 VIEW COMPARISON:  September 21, 2008. FINDINGS: Stable cardiomediastinal silhouette. Bilateral interstitial densities are noted concerning for pulmonary edema with probable small bilateral pleural effusions. Bony thorax is  unremarkable. IMPRESSION: Bilateral interstitial densities are noted concerning for pulmonary edema with  probable small bilateral pleural effusions. Electronically Signed   By: Lynwood Landy Raddle M.D.   On: 01/13/2024 08:47     TODAY-DAY OF DISCHARGE:  Subjective:   Sydney [name] today has no headache,no chest abdominal pain,no new weakness tingling or numbness, feels much better wants to go home today.   Objective:   Blood pressure (!) 141/72, pulse 64, temperature (!) 97.5 F (36.4 C), temperature source Oral, resp. rate (!) 21, height 5' 6 (1.676 m), weight 77.8 kg, SpO2 90%.  Intake/Output Summary (Last 24 hours) at 01/15/2024 1112 Last data filed at 01/14/2024 1800 Gross per 24 hour  Intake 367.4 ml  Output --  Net 367.4 ml   Filed Weights   01/13/24 0732 01/15/24 0500  Weight: 70.3 kg 77.8 kg    Exam: Awake Alert, Oriented *3, No new F.N deficits, Normal affect [place].AT,PERRAL Supple Neck,No JVD, No cervical lymphadenopathy appriciated.  Symmetrical Chest wall movement, Good air movement bilaterally, CTAB RRR,No Gallops,Rubs or new Murmurs, No Parasternal Heave +ve B.Sounds, Abd Soft, Non tender, No organomegaly appriciated, No rebound -guarding or rigidity. No Cyanosis, Clubbing or edema, No new Rash or bruise   PERTINENT RADIOLOGIC STUDIES: CT Angio Abd/Pel w/ and/or w/o Result Date: 01/13/2024 CLINICAL DATA:  Abdominal aortic aneurysm (AAA), follow up Patient coming in with new onset atrial fibrillation and history of AAA and shortness of breath EXAM: CTA ABDOMEN AND PELVIS WITHOUT AND WITH CONTRAST TECHNIQUE: Multidetector CT imaging of the abdomen and pelvis was performed using the standard protocol during bolus administration of intravenous contrast. Multiplanar reconstructed images and MIPs were obtained and reviewed to evaluate the vascular anatomy. RADIATION DOSE REDUCTION: This exam was performed according to the departmental dose-optimization program which includes automated exposure control, adjustment of the mA and/or kV according to patient size and/or use of iterative  reconstruction technique. CONTRAST:  75mL OMNIPAQUE  IOHEXOL  350 MG/ML SOLN COMPARISON:  None Available. FINDINGS: VASCULAR Aorta: No aortic aneurysm, intramural hematoma, or aortic dissection. Diffuse calcified noncalcified atherosclerosis throughout the aorta resulting in mild stenosis throughout. Celiac: Patent without acute thrombus, aneurysm, or dissection.No hemodynamically significant stenosis. SMA: Patent without acute thrombus, aneurysm, or dissection.No hemodynamically significant stenosis. Renals: Patent without acute thrombus, aneurysm, or dissection.Moderate to severe narrowing of the right renal artery from mixed density plaque at the ostium. IMA: Patent without acute thrombus, aneurysm, or dissection.Severe narrowing of the IMA ostium from noncalcified plaque. There is a focal, noncalcified moderate stenosis just downstream from the ostium. Inflow: Focal pseudoaneurysm arising from the posterior right common iliac artery measuring 1.6 cm (axial 137). Fusiform ectasia of the mid and distal left common iliac artery measuring 2 cm.Diffusely calcified and noncalcified plaque throughout the common iliac arteries causing moderate diffuse stenosis on the left. The external iliac arteries are widely patent bilaterally. Proximal Outflow: The bilateral common femoral and visualized portions of the superficial and profunda femoral arteries are patent without acute thrombus, aneurysm, or dissection.No hemodynamically significant stenosis. Veins: No obvious venous abnormality within the limitations of this arterial phase study. Review of the MIP images confirms the above findings. NON-VASCULAR Lower chest: Moderate volume bilateral pleural effusions with dense airspace consolidation in both lower lobes, likely atelectasis.6 mm right middle lobe nodule (axial 16). Hepatobiliary: No mass.No radiopaque stones or wall thickening of the gallbladder.No intrahepatic or extrahepatic biliary ductal dilation. Pancreas: No  mass or main ductal dilation.No peripancreatic inflammation or fluid collection. Spleen: Normal size. No mass. Adrenals/Urinary  Tract: No adrenal masses. No renal mass. No hydronephrosis or nephrolithiasis. The urinary bladder is distended without focal abnormality. Stomach/Bowel: The stomach is decompressed without focal abnormality. No small bowel wall thickening or inflammation. No small bowel obstruction.Normal appendix. Sigmoid colonic diverticulosis. No changes of acute diverticulitis. Lymphatic: No intraabdominal or pelvic lymphadenopathy. Reproductive: Age-related atrophy of the uterus and ovaries. No concerning adnexal mass.No free pelvic fluid. Other: No pneumoperitoneum, ascites, or mesenteric inflammation. Musculoskeletal: No acute fracture or destructive lesion.Diffuse osteopenia. Both hip arthroplasties are anatomically aligned without dislocation. Multilevel degenerative disc disease of the spine. Posterior fusion and laminectomy changes in the lumbar spine moderate grade 2 anterolisthesis of L5 on S1. IMPRESSION: VASCULAR 1. No aortic aneurysm, intramural hematoma, or aortic dissection. 2. 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 Fusiform ectasia of the mid and distal left common iliac artery measuring 2 cm. No adjacent inflammation or fluid to suggest rupture. NON-VASCULAR 1. No additional, acute findings within the abdomen or pelvis. 2. Sigmoid diverticulosis.  No changes of acute diverticulitis. 3. Moderate volume bilateral pleural effusions with bibasilar compressive atelectasis. 4. In the medial right middle lobe, there is a 6 mm lung nodule. Comparison with outside imaging is recommended to document stability. Aortic Atherosclerosis (ICD10-I70.0). Electronically Signed   By: Rogelia Myers M.D.   On: 01/13/2024 16:06   ECHOCARDIOGRAM COMPLETE Result Date: 01/13/2024    ECHOCARDIOGRAM REPORT   Patient Name:   Sydney [name] Date  of Exam: 01/13/2024 Medical Rec #:  [id number]     Height:       66.0 in Accession #:    [id number]    Weight:       155.0 lb Date of Birth:  01-10-1933     BSA:          1.794 m Patient Age:    89 years      BP:           146/92 mmHg Patient Gender: F             HR:           94 bpm. Exam Location:  Inpatient Procedure: 2D Echo, Cardiac Doppler, Color Doppler and Intracardiac            Opacification Agent (Both Spectral and Color Flow Doppler were            utilized during procedure). Indications:     Atrial fibrillation I48.91  History:         Patient has no prior history of Echocardiogram examinations.                  H/O Ascending aortic aneurysm, Respiratory distress, Elevated                  troponin, Hypertension, Hyperlipidemia.  Sonographer:     BERNARDA ROCKS Referring Phys:  [id number] RONDELL A SMITH Diagnosing Phys: [name] MD IMPRESSIONS  1. Left ventricular ejection fraction, by estimation, is 20 to 25%. The left ventricle has severely decreased function. The left ventricle demonstrates global hypokinesis. The left ventricular internal cavity size was mildly dilated. Left ventricular diastolic parameters are consistent with Grade I diastolic dysfunction (impaired relaxation).  2. Right ventricular systolic function is normal. The right ventricular size is normal. There is normal pulmonary artery systolic pressure.  3. Left atrial size was moderately dilated.  4. The mitral valve is degenerative. Moderate mitral valve regurgitation. No evidence of mitral stenosis.  5. The aortic valve is tricuspid. There is mild calcification of the aortic valve. Aortic valve regurgitation is trivial.  6. The inferior vena cava is normal in size with <50% respiratory variability, suggesting right atrial pressure of 8 mmHg. Conclusion(s)/Recommendation(s): Findings consistent with dilated cardiomyopathy and ischemic cardiomyopathy. FINDINGS  Left Ventricle: Left ventricular ejection fraction, by estimation, is 20  to 25%. The left ventricle has severely decreased function. The left ventricle demonstrates global hypokinesis. The left ventricular internal cavity size was mildly dilated. There is no left ventricular hypertrophy. Left ventricular diastolic parameters are consistent with Grade I diastolic dysfunction (impaired relaxation). Right Ventricle: The right ventricular size is normal. No increase in right ventricular wall thickness. Right ventricular systolic function is normal. There is normal pulmonary artery systolic pressure. The tricuspid regurgitant velocity is 2.79 m/s, and  with an assumed right atrial pressure of 3 mmHg, the estimated right ventricular systolic pressure is 34.1 mmHg. Left Atrium: Left atrial size was moderately dilated. Right Atrium: Right atrial size was normal in size. Pericardium: There is no evidence of pericardial effusion. Mitral Valve: The mitral valve is degenerative in appearance. There is mild calcification of the posterior mitral valve leaflet(s). Mild mitral annular calcification. Moderate mitral valve regurgitation. No evidence of mitral valve stenosis. MV peak gradient, 4.4 mmHg. The mean mitral valve gradient is 2.0 mmHg. Tricuspid Valve: The tricuspid valve is normal in structure. Tricuspid valve regurgitation is mild. Aortic Valve: The aortic valve is tricuspid. There is mild calcification of the aortic valve. There is mild aortic valve annular calcification. Aortic valve regurgitation is trivial. Aortic regurgitation PHT measures 764 msec. Aortic valve mean gradient measures 5.0 mmHg. Aortic valve peak gradient measures 9.2 mmHg. Aortic valve area, by VTI measures 2.20 cm. Pulmonic Valve: The pulmonic valve was normal in structure. Pulmonic valve regurgitation is trivial. Aorta: The aortic root is normal in size and structure. There is minimal (Grade I) atheroma plaque involving the aortic root and ascending aorta. Venous: The inferior vena cava is normal in size with less than  50% respiratory variability, suggesting right atrial pressure of 8 mmHg. IAS/Shunts: The atrial septum is grossly normal.  LEFT VENTRICLE PLAX 2D LVIDd:         5.50 cm      Diastology LVIDs:         5.10 cm      LV e' medial:    8.59 cm/s LV PW:         0.70 cm      LV E/e' medial:  8.6 LV IVS:        0.90 cm      LV e' lateral:   10.00 cm/s LVOT diam:     2.00 cm      LV E/e' lateral: 7.4 LV SV:         55 LV SV Index:   30 LVOT Area:     3.14 cm  LV Volumes (MOD) LV vol d, MOD A2C: 166.0 ml LV vol d, MOD A4C: 197.0 ml LV vol s, MOD A2C: 126.0 ml LV vol s, MOD A4C: 152.0 ml LV SV MOD A2C:     40.0 ml LV SV MOD A4C:     197.0 ml LV SV MOD BP:      41.2 ml RIGHT VENTRICLE             IVC RV Basal diam:  2.80 cm     IVC diam: 1.20 cm RV S prime:  18.20 cm/s TAPSE (M-mode): 1.8 cm RVSP:           34.1 mmHg LEFT ATRIUM             Index        RIGHT ATRIUM           Index LA diam:        3.20 cm 1.78 cm/m   RA Pressure: 3.00 mmHg LA Vol (A2C):   63.2 ml 35.22 ml/m  RA Area:     12.40 cm LA Vol (A4C):   55.1 ml 30.71 ml/m  RA Volume:   28.90 ml  16.10 ml/m LA Biplane Vol: 60.1 ml 33.49 ml/m  AORTIC VALVE                    PULMONIC VALVE AV Area (Vmax):    2.29 cm     PV Vmax:          0.96 m/s AV Area (Vmean):   1.91 cm     PV Peak grad:     3.7 mmHg AV Area (VTI):     2.20 cm     PR End Diast Vel: 2.51 msec AV Vmax:           152.00 cm/s AV Vmean:          99.600 cm/s AV VTI:            0.248 m AV Peak Grad:      9.2 mmHg AV Mean Grad:      5.0 mmHg LVOT Vmax:         111.00 cm/s LVOT Vmean:        60.500 cm/s LVOT VTI:          0.174 m LVOT/AV VTI ratio: 0.70 AI PHT:            764 msec  AORTA Ao Root diam: 3.20 cm Ao Asc diam:  4.00 cm MITRAL VALVE                  TRICUSPID VALVE MV Area (PHT): 3.95 cm       TR Peak grad:   31.1 mmHg MV Area VTI:   2.06 cm       TR Vmax:        279.00 cm/s MV Peak grad:  4.4 mmHg       Estimated RAP:  3.00 mmHg MV Mean grad:  2.0 mmHg       RVSP:           34.1 mmHg  MV Vmax:       1.05 m/s MV Vmean:      62.6 cm/s      SHUNTS MV Decel Time: 192 msec       Systemic VTI:  0.17 m MR Peak grad:    139.2 mmHg   Systemic Diam: 2.00 cm MR Mean grad:    80.0 mmHg MR Vmax:         590.00 cm/s MR Vmean:        410.0 cm/s MR PISA:         3.08 cm MR PISA Eff ROA: 20 mm MR PISA Radius:  0.70 cm MV E velocity: 73.70 cm/s MV A velocity: 124.00 cm/s MV E/A ratio:  0.59 [name] MD Electronically signed by [name] MD Signature Date/Time: 01/13/2024/3:44:34 PM    Final      PERTINENT LAB RESULTS: CBC: Recent Labs    01/14/24 0257 01/15/24  0245  WBC 6.1 4.5  HGB 13.8 13.4  HCT 42.4 41.1  PLT 172 177   CMET CMP     Component Value Date/Time   NA 140 01/14/2024 0257   K 4.0 01/14/2024 0257   CL 107 01/14/2024 0257   CO2 25 01/14/2024 0257   GLUCOSE 109 (H) 01/14/2024 0257   BUN 12 01/14/2024 0257   CREATININE 1.13 (H) 01/14/2024 0257   CREATININE 1.13 (H) 01/13/2014 1639   CALCIUM  9.1 01/14/2024 0257   PROT 6.6 01/13/2024 0730   ALBUMIN 3.3 (L) 01/13/2024 0730   AST 20 01/13/2024 0730   ALT 9 01/13/2024 0730   ALKPHOS 68 01/13/2024 0730   BILITOT 0.7 01/13/2024 0730   GFR 53.35 (L) 07/01/2023 0941   GFRNONAA 47 (L) 01/14/2024 0257    GFR Estimated Creatinine Clearance: 35.5 mL/min (A) (by C-G formula based on SCr of 1.13 mg/dL (H)). No results for input(s): LIPASE, AMYLASE in the last 72 hours. No results for input(s): CKTOTAL, CKMB, CKMBINDEX, TROPONINI in the last 72 hours. Invalid input(s): POCBNP No results for input(s): DDIMER in the last 72 hours. No results for input(s): HGBA1C in the last 72 hours. Recent Labs    01/14/24 0257  CHOL 204*  HDL 61  LDLCALC 129*  TRIG 72  CHOLHDL 3.3   Recent Labs    01/13/24 1220  TSH 1.249   No results for input(s): VITAMINB12, FOLATE, FERRITIN, TIBC, IRON, RETICCTPCT in the last 72 hours. Coags: No results for input(s): INR in the last 72  hours.  Invalid input(s): PT Microbiology: Recent Results (from the past 240 hours)  Culture, blood (routine x 2)     Status: None (Preliminary result)   Collection Time: 01/13/24  8:05 AM   Specimen: BLOOD RIGHT FOREARM  Result Value Ref Range Status   Specimen Description BLOOD RIGHT FOREARM  Final   Special Requests   Final    BOTTLES DRAWN AEROBIC AND ANAEROBIC Blood Culture adequate volume   Culture   Final    NO GROWTH 2 DAYS Performed at [facility], [address] [place], [place] [address]    Report Status PENDING  Incomplete  Resp panel by RT-PCR (RSV, Flu A&B, Covid) Anterior Nasal Swab     Status: Abnormal   Collection Time: 01/13/24  8:08 AM   Specimen: Anterior Nasal Swab  Result Value Ref Range Status   SARS Coronavirus 2 by RT PCR NEGATIVE NEGATIVE Final   Influenza A by PCR NEGATIVE NEGATIVE Final   Influenza B by PCR NEGATIVE NEGATIVE Final    Comment: (NOTE) The Xpert Xpress SARS-CoV-2/FLU/RSV plus assay is intended as an aid in the diagnosis of influenza from Nasopharyngeal swab specimens and should not be used as a sole basis for treatment. Nasal washings and aspirates are unacceptable for Xpert Xpress SARS-CoV-2/FLU/RSV testing.  Fact Sheet for Patients: BloggerCourse.com  Fact Sheet for Healthcare Providers: SeriousBroker.it  This test is not yet approved or cleared by the [place]  FDA and has been authorized for detection and/or diagnosis of SARS-CoV-2 by FDA under an Emergency Use Authorization (EUA). This EUA will remain in effect (meaning this test can be used) for the duration of the COVID-19 declaration under Section 564(b)(1) of the Act, 21 U.S.C. section 360bbb-3(b)(1), unless the authorization is terminated or revoked.     Resp Syncytial Virus by PCR POSITIVE (A) NEGATIVE Final    Comment: (NOTE) Fact Sheet for Patients: BloggerCourse.com  Fact  Sheet for Healthcare Providers: SeriousBroker.it  This test is not yet approved or cleared by the [place]  FDA and has been authorized for detection and/or diagnosis of SARS-CoV-2 by FDA under an Emergency Use Authorization (EUA). This EUA will remain in effect (meaning this test can be used) for the duration of the COVID-19 declaration under Section 564(b)(1) of the Act, 21 U.S.C. section 360bbb-3(b)(1), unless the authorization is terminated or revoked.  Performed at [facility], [address], [place], [place] [address]   Culture, blood (routine x 2)     Status: None (Preliminary result)   Collection Time: 01/13/24  8:10 AM   Specimen: BLOOD LEFT HAND  Result Value Ref Range Status   Specimen Description BLOOD LEFT HAND  Final   Special Requests   Final    BOTTLES DRAWN AEROBIC AND ANAEROBIC Blood Culture adequate volume   Culture   Final    NO GROWTH 2 DAYS Performed at [facility], [address], [place], [place] [address]    Report Status PENDING  Incomplete    FURTHER DISCHARGE INSTRUCTIONS:  Get Medicines reviewed and adjusted: Please take all your medications with you for your next visit with your Primary MD  Laboratory/radiological data: Please request your Primary MD to go over all hospital tests and procedure/radiological results at the follow up, please ask your Primary MD to get all Hospital records sent to his/her office.  In some cases, they will be blood work, cultures and biopsy results pending at the time of your discharge. Please request that your primary care M.D. goes through all the records of your hospital data and follows up on these results.  Also Note the following: If you experience worsening of your admission symptoms, develop shortness of breath, life threatening emergency, suicidal or homicidal thoughts you must seek medical attention immediately by calling 911 or calling your MD immediately  if  symptoms less severe.  You must read complete instructions/literature along with all the possible adverse reactions/side effects for all the Medicines you take and that have been prescribed to you. Take any new Medicines after you have completely understood and accpet all the possible adverse reactions/side effects.   Do not drive when taking Pain medications or sleeping medications (Benzodaizepines)  Do not take more than prescribed Pain, Sleep and Anxiety Medications. It is not advisable to combine anxiety,sleep and pain medications without talking with your primary care practitioner  Special Instructions: If you have smoked or chewed Tobacco  in the last 2 yrs please stop smoking, stop any regular Alcohol  and or any Recreational drug use.  Wear Seat belts while driving.  Please note: You were cared for by a hospitalist during your hospital stay. Once you are discharged, your primary care physician will handle any further medical issues. Please note that NO REFILLS for any discharge medications will be authorized once you are discharged, as it is imperative that you return to your primary care physician (or establish a relationship with a primary care physician if you do not have one) for your post hospital discharge needs so that they can reassess your need for medications and monitor your lab values.  Total Time spent coordinating discharge including counseling, education and face to face time equals greater than 30 minutes.  Signed: Nakai Yard 01/15/2024 11:12 AM

## 2024-01-15 NOTE — Telephone Encounter (Signed)
 Pt needs a hospital follow-up so a referral can be done. I would say 1st available with any provider as she has not had a TOC, yet.

## 2024-01-15 NOTE — Progress Notes (Signed)
 PHARMACY - ANTICOAGULATION CONSULT NOTE  Pharmacy Consult for heparin Indication: chest pain/ACS and atrial fibrillation  Allergies  Allergen Reactions   Lipitor [Atorvastatin ]     Dizzy ( fuzziness)   Latex Rash   Patient Measurements: Height: 5' 6 (167.6 cm) Weight: 77.8 kg (171 lb 8.3 oz) IBW/kg (Calculated) : 59.3 HEPARIN DW (KG): 70.3  Vital Signs: Temp: 97.5 F (36.4 C) (10/03 0400) Temp Source: Oral (10/03 0400) BP: 141/72 (10/03 0400) Pulse Rate: 64 (10/03 0400)  Labs: Recent Labs    01/13/24 0730 01/13/24 0811 01/13/24 1032 01/13/24 1918 01/14/24 0257 01/15/24 0245  HGB 15.6* 16.0*  --   --  13.8 13.4  HCT 48.3* 47.0*  --   --  42.4 41.1  PLT 196  --   --   --  172 177  HEPARINUNFRC  --   --   --  0.22* 0.40 0.43  CREATININE 1.14*  --   --   --  1.13*  --   TROPONINIHS 158*  --  889*  --   --   --    Estimated Creatinine Clearance: 35.5 mL/min (A) (by C-G formula based on SCr of 1.13 mg/dL (H)).   Assessment: 70 YOF presenting from home with respiratory distress and elevated troponins. Patient also with new afib. No history of anticoagulation PTA documented. Pharmacy has been consulted to dose heparin for elevated troponins/ACS and new onset atrial fibrillation.   Heparin level therapeutic   Goal of Therapy:  Heparin level 0.3-0.7 units/ml Monitor platelets by anticoagulation protocol: Yes   Plan:  Continue heparin at 1150 units / hr - now to Eliquis 5 mg po BID Daily heparin level, CBC  Thank you. Olam Monte, PharmD 01/15/2024, 9:41 AM

## 2024-01-15 NOTE — Progress Notes (Deleted)
 PHARMACY - ANTICOAGULATION CONSULT NOTE  Pharmacy Consult for heparin Indication: chest pain/ACS and atrial fibrillation  Allergies  Allergen Reactions   Lipitor [Atorvastatin ]     Dizzy ( fuzziness)   Latex Rash   Patient Measurements: Height: 5' 6 (167.6 cm) Weight: 77.8 kg (171 lb 8.3 oz) IBW/kg (Calculated) : 59.3 HEPARIN DW (KG): 70.3  Vital Signs: Temp: 97.5 F (36.4 C) (10/03 0400) Temp Source: Oral (10/03 0400) BP: 141/72 (10/03 0400) Pulse Rate: 64 (10/03 0400)  Labs: Recent Labs    01/13/24 0730 01/13/24 0811 01/13/24 1032 01/13/24 1918 01/14/24 0257 01/15/24 0245  HGB 15.6* 16.0*  --   --  13.8 13.4  HCT 48.3* 47.0*  --   --  42.4 41.1  PLT 196  --   --   --  172 177  HEPARINUNFRC  --   --   --  0.22* 0.40 0.43  CREATININE 1.14*  --   --   --  1.13*  --   TROPONINIHS 158*  --  889*  --   --   --    Estimated Creatinine Clearance: 35.5 mL/min (A) (by C-G formula based on SCr of 1.13 mg/dL (H)).   Assessment: 69 YOF presenting from home with respiratory distress and elevated troponins. Patient also with new afib. No history of anticoagulation PTA documented. Pharmacy has been consulted to dose heparin for elevated troponins/ACS and new onset atrial fibrillation.   Heparin level therapeutic   Goal of Therapy:  Heparin level 0.3-0.7 units/ml Monitor platelets by anticoagulation protocol: Yes   Plan:  Continue heparin at 1150 units / hr Daily heparin level, CBC  Thank you. Olam Monte, PharmD 01/15/2024, 8:39 AM

## 2024-01-15 NOTE — Evaluation (Signed)
 Occupational Therapy Evaluation Patient Details Name: Sydney [name] MRN: [id number] DOB: 1932/08/17 Today's Date: 01/15/2024   History of Present Illness   Pt is an 89 y.o. female admitted 10/1 with RSV. On admission pt with primary c/o coughing and difficulty breathing. PMH:  HTN, hyperlipidemia, polymyalgia rheumatica, GERD, and AAA     Clinical Impressions At baseline, pt is Ind to Mod I with ADLs and Mod I with functional mobility with a SPC or RW. Pt receives assistance from family for IADLs. Pt now presents with decreased activity tolerance and mildly decreased balance with no overt LOB noted during session. Pt currently demonstrating ability to complete ADLs largely Ind to Contact guard assist for safety and functional mobility with a RW with Contact guard assist for safety. Pt and her son report that pt is near baseline PLOF. Pt VSS on RA. OT to follow acutely during admission to address deficits and increase safety and independence with ADLs. Plan for pt to discharge home later today. Pt is safe to discharge home with family assist from an OT standpoint. As pt is very near baseline PLOF, no post acute skilled OT needs are anticipated.      If plan is discharge home, recommend the following:   A little help with walking and/or transfers;A little help with bathing/dressing/bathroom;Assistance with cooking/housework;Assist for transportation;Help with stairs or ramp for entrance     Functional Status Assessment   Patient has had a recent decline in their functional status and demonstrates the ability to make significant improvements in function in a reasonable and predictable amount of time.     Equipment Recommendations   None recommended by OT     Recommendations for Other Services         Precautions/Restrictions   Precautions Precautions: Fall Recall of Precautions/Restrictions: Intact Restrictions Weight Bearing Restrictions Per Provider Order: No      Mobility Bed Mobility Overal bed mobility: Needs Assistance Bed Mobility: Supine to Sit     Supine to sit: Supervision, HOB elevated, Used rails     General bed mobility comments: with increased time    Transfers Overall transfer level: Needs assistance Equipment used: Rolling walker (2 wheels) Transfers: Sit to/from Stand, Bed to chair/wheelchair/BSC Sit to Stand: Contact guard assist     Step pivot transfers: Contact guard assist     General transfer comment: for safety/lines, no physical assist. Occasional cues for hand placement and safety. Pt tends to pull up on RW.      Balance Overall balance assessment: Mild deficits observed, not formally tested                                         ADL either performed or assessed with clinical judgement   ADL Overall ADL's : Needs assistance/impaired Eating/Feeding: Independent;Sitting   Grooming: Supervision/safety;Contact guard assist;Standing   Upper Body Bathing: Set up;Supervision/ safety;Sitting   Lower Body Bathing: Supervison/ safety;Contact guard assist;Sit to/from stand   Upper Body Dressing : Set up;Contact guard assist;Sitting Upper Body Dressing Details (indicate cue type and reason): assist for line management Lower Body Dressing: Supervision/safety;Contact guard assist;Sit to/from stand   Toilet Transfer: Minimal assistance;Ambulation;Rolling walker (2 wheels);Regular Toilet;Cueing for safety   Toileting- Clothing Manipulation and Hygiene: Supervision/safety;Sitting/lateral lean;Sit to/from stand       Functional mobility during ADLs: Contact guard assist;Rolling walker (2 wheels);Cueing for safety General ADL Comments: Pt requires occasional cues  for sfaety due to impulsivity.     Vision Baseline Vision/History: 1 Wears glasses (readers) Ability to See in Adequate Light: 0 Adequate Patient Visual Report: No change from baseline Additional Comments: Vision [facility] for tasks  assessed; not formally screened or evaluated     Perception         Praxis         Pertinent Vitals/Pain Pain Assessment Pain Assessment: No/denies pain     Extremity/Trunk Assessment Upper Extremity Assessment Upper Extremity Assessment: Right hand dominant;RUE deficits/detail;LUE deficits/detail;Overall [facility] for tasks assessed RUE Deficits / Details: Overall WFL; pt with hx of rotator cuff tear with gross strength in shoulder 3+/5 at baseline LUE Deficits / Details: Overall WFL; pt with hx of rotator cuff tear with subsequent sx and now with gross strength in shoulder 3+/5 at baseline   Lower Extremity Assessment Lower Extremity Assessment: Defer to PT evaluation   Cervical / Trunk Assessment Cervical / Trunk Assessment: Kyphotic   Communication Communication Communication: No apparent difficulties   Cognition Arousal: Alert Behavior During Therapy: WFL for tasks assessed/performed Cognition: No apparent impairments             OT - Cognition Comments: Pt AAOx4 and pleasant throughout session. Pt with occasional impulsiveness with movement and requiring occasional cues for safety with RW. Pt cognition otherwise WFL for tasks assessed; cognition not formally screened or evaluated                 Following commands: Intact       Cueing  General Comments   Cueing Techniques: Verbal cues  VSS on RA. Son present and supportive throughout session.   Exercises Exercises: General Upper Extremity General Exercises - Upper Extremity Shoulder Flexion: AROM, Strengthening, Both, 10 reps, Seated Shoulder Extension: AROM, Strengthening, Both, 10 reps, Seated Shoulder Horizontal ABduction: AROM, Strengthening, Both, 10 reps, Seated Shoulder Horizontal ADduction: AROM, Strengthening, Both, 10 reps, Seated   Shoulder Instructions      Home Living Family/patient expects to be discharged to:: Private residence Living Arrangements: Alone Available Help at Discharge:  Family;Available PRN/intermittently Type of Home: House Home Access: Stairs to enter [employer] of Steps: 3 Entrance Stairs-Rails: Left Home Layout: One level     Bathroom Shower/Tub: Producer, television/film/video: Handicapped height Bathroom Accessibility: Yes   Home Equipment: Cane - single point;Rollator (4 wheels);Shower seat   Additional Comments: Pt son lives in separate house on same property. Pt aslo has good support from her granddaughter.      Prior Functioning/Environment Prior Level of Function : Needs assist             Mobility Comments: mod I mobility with cane vs RW in house and community. Son assists on steps in/out of house. ADLs Comments: mod I basic ADLs. Family does cooking, cleaning, driving, and grocery shopping.    OT Problem List: Decreased activity tolerance;Decreased strength;Impaired balance (sitting and/or standing)   OT Treatment/Interventions: Self-care/ADL training;Therapeutic exercise;Energy conservation;DME and/or AE instruction;Therapeutic activities;Patient/family education;Balance training      OT Goals(Current goals can be found in the care plan section)   Acute Rehab OT Goals Patient Stated Goal: to return home OT Goal Formulation: With patient Time For Goal Achievement: 01/29/24 Potential to Achieve Goals: Good ADL Goals Pt Will Perform Grooming: with modified independence;standing Pt Will Perform Lower Body Bathing: with modified independence;sit to/from stand Pt Will Perform Lower Body Dressing: with modified independence;sit to/from stand Pt Will Transfer to Toilet: with modified independence;ambulating;regular height toilet (  with least restrictive AD) Pt/caregiver will Perform Home Exercise Program: Increased strength;Independently;With written HEP provided;With theraband (Bilateral shoulders)   OT Frequency:  Min 1X/week    Co-evaluation              AM-PAC OT 6 Clicks Daily Activity      Outcome Measure Help from another person eating meals?: None Help from another person taking care of personal grooming?: A Little Help from another person toileting, which includes using toliet, bedpan, or urinal?: A Little Help from another person bathing (including washing, rinsing, drying)?: A Little Help from another person to put on and taking off regular upper body clothing?: A Little Help from another person to put on and taking off regular lower body clothing?: A Little 6 Click Score: 19   End of Session Equipment Utilized During Treatment: Gait belt;Rolling walker (2 wheels) Nurse Communication: Mobility status  Activity Tolerance: Patient tolerated treatment well Patient left: in bed;with call bell/phone within reach;with family/visitor present (sitting EOB)  OT Visit Diagnosis: Unsteadiness on feet (R26.81);Muscle weakness (generalized) (M62.81)                Time: 8958-8890 OT Time Calculation (min): 28 min Charges:  OT General Charges $OT Visit: 1 Visit OT Evaluation $OT Eval Low Complexity: 1 Low OT Treatments $Self Care/Home Management : 8-22 mins  Margarie Rockey HERO., OTR/L, MA Acute Rehab [phone]   Margarie FORBES Horns 01/15/2024, 12:38 PM

## 2024-01-15 NOTE — Discharge Instructions (Addendum)

## 2024-01-15 NOTE — Consult Note (Signed)
 Ref: Sydney [name], MD   Subjective:  Feeling better. VS stable. Monitor  Normal sinus rhythm. Elevated LDL cholesterol. Lp(a) is pending.  Objective:  Vital Signs in the last 24 hours: Temp:  [97.4 F (36.3 C)-99 F (37.2 C)] 97.5 F (36.4 C) (10/03 0400) Pulse Rate:  [60-74] 64 (10/03 0400) Cardiac Rhythm: Normal sinus rhythm (10/03 0703) Resp:  [18-21] 21 (10/02 2359) BP: (109-141)/(64-79) 141/72 (10/03 0400) SpO2:  [90 %-95 %] 90 % (10/03 0400) Weight:  [77.8 kg] 77.8 kg (10/03 0500)  Physical Exam: BP Readings from Last 1 Encounters:  01/15/24 (!) 141/72     Wt Readings from Last 1 Encounters:  01/15/24 77.8 kg    Weight change: 7.493 kg Body mass index is 27.68 kg/m. HEENT: [place]/AT, Eyes-Blue, Conjunctiva-Pink, Sclera-Non-icteric Neck: No JVD, No bruit, Trachea midline. Lungs:  Clearing, Bilateral. Cardiac:  Regular rhythm, normal S1 and S2, no S3. II/VI systolic murmur. Abdomen:  Soft, non-tender. BS present. Extremities:  No edema present. No cyanosis. No clubbing. CNS: AxOx3, Cranial nerves grossly intact, moves all 4 extremities.  Skin: Warm and dry.   Intake/Output from previous day: 10/02 0701 - 10/03 0700 In: 367.4 [I.V.:367.4] Out: -     Lab Results: BMET    Component Value Date/Time   NA 140 01/14/2024 0257   NA 140 01/13/2024 0811   NA 137 01/13/2024 0730   K 4.0 01/14/2024 0257   K 3.9 01/13/2024 0811   K 3.7 01/13/2024 0730   CL 107 01/14/2024 0257   CL 105 01/13/2024 0730   CL 103 07/01/2023 0941   CO2 25 01/14/2024 0257   CO2 18 (L) 01/13/2024 0730   CO2 27 07/01/2023 0941   GLUCOSE 109 (H) 01/14/2024 0257   GLUCOSE 221 (H) 01/13/2024 0730   GLUCOSE 110 (H) 07/01/2023 0941   BUN 12 01/14/2024 0257   BUN 12 01/13/2024 0730   BUN 11 07/01/2023 0941   CREATININE 1.13 (H) 01/14/2024 0257   CREATININE 1.14 (H) 01/13/2024 0730   CREATININE 0.95 07/01/2023 0941   CREATININE 1.13 (H) 01/13/2014 1639   CALCIUM  9.1 01/14/2024 0257    CALCIUM  9.5 01/13/2024 0730   CALCIUM  10.7 (H) 07/01/2023 0941   GFRNONAA 47 (L) 01/14/2024 0257   GFRNONAA 46 (L) 01/13/2024 0730   GFRNONAA >60 08/28/2020 0452   GFRAA 59 (L) 09/02/2019 1305   GFRAA 55 (L) 08/09/2015 0849   GFRAA  09/26/2008 0344    >60        The eGFR has been calculated using the MDRD equation. This calculation has not been validated in all clinical situations. eGFR's persistently <60 mL/min signify possible Chronic Kidney Disease.   CBC    Component Value Date/Time   WBC 4.5 01/15/2024 0245   RBC 4.42 01/15/2024 0245   HGB 13.4 01/15/2024 0245   HCT 41.1 01/15/2024 0245   PLT 177 01/15/2024 0245   MCV 93.0 01/15/2024 0245   MCH 30.3 01/15/2024 0245   MCHC 32.6 01/15/2024 0245   RDW 13.1 01/15/2024 0245   LYMPHSABS 1.2 01/13/2024 0730   MONOABS 0.4 01/13/2024 0730   EOSABS 0.1 01/13/2024 0730   BASOSABS 0.1 01/13/2024 0730   HEPATIC Function Panel Recent Labs    07/01/23 0941 01/13/24 0730  PROT 7.1 6.6  ALBUMIN 4.4 3.3*  AST 16 20  ALT 9 9  ALKPHOS 74 68   HEMOGLOBIN A1C Lab Results  Component Value Date   MPG 128 (H) 01/13/2014   CARDIAC ENZYMES No results  found for: CKTOTAL, CKMB, CKMBINDEX, TROPONINI BNP No results for input(s): PROBNP in the last 8760 hours. TSH Recent Labs    07/01/23 0941 01/13/24 1220  TSH 0.90 1.249   CHOLESTEROL Recent Labs    01/14/24 0257  CHOL 204*    Scheduled Meds:  apixaban  2.5 mg Oral BID   aspirin  EC  81 mg Oral Daily   carvedilol  6.25 mg Oral BID WC   ezetimibe  10 mg Oral Daily   guaiFENesin  600 mg Oral BID   losartan  25 mg Oral Daily   polyethylene glycol  17 g Oral Daily   sodium chloride  flush  3 mL Intravenous Q12H   Continuous Infusions: PRN Meds:.acetaminophen  **OR** acetaminophen , albuterol, bisacodyl , guaiFENesin-dextromethorphan, hydrALAZINE , mouth rinse, senna-docusate  Assessment/Plan: Acute systolic left heart failure Ischemic and Dilated  cardiomyopathy Abnormal troponin I from demand ischemia Possible Small NSTEMI Atrial fibrillation, paroxysmal HTN HLD RSV respiratory infection  Plan: Change heparin to Eliquis. Add Ezetimibe as intolerant to statins.  May discharge home with f/u in 2 weeks.   LOS: 2 days   Time spent including chart review, lab review, examination, discussion with patient/Doctor/Nurse : 30 min   Sydney Negri  MD  01/15/2024, 9:40 AM

## 2024-01-15 NOTE — Progress Notes (Addendum)
 Discharge OK to discharge per Aisha RN  Pt alert oriented pleasant.   Pt expressed verbal understanding of discharge POC.  Extensive teaching on Eliquis.  Patient states she has taken medications that thin her blood before and does not need further education from pharmacy.  NO PIV or Tele during discharge.  TOC meds in progress.  Volunteer to transport to [employer].

## 2024-01-16 LAB — LIPOPROTEIN A (LPA): Lipoprotein (a): 23.2 nmol/L (ref <75.0)

## 2024-01-18 ENCOUNTER — Ambulatory Visit: Payer: Self-pay | Admitting: Family

## 2024-01-18 LAB — CULTURE, BLOOD (ROUTINE X 2)
Culture: NO GROWTH
Culture: NO GROWTH
Special Requests: ADEQUATE
Special Requests: ADEQUATE

## 2024-01-18 NOTE — Progress Notes (Signed)
 noted

## 2024-01-19 ENCOUNTER — Encounter: Payer: Self-pay | Admitting: Family

## 2024-01-19 ENCOUNTER — Ambulatory Visit: Admitting: Family

## 2024-01-19 ENCOUNTER — Telehealth: Payer: Self-pay

## 2024-01-19 VITALS — BP 130/74 | HR 60 | Temp 98.6°F | Ht 66.0 in | Wt 155.0 lb

## 2024-01-19 DIAGNOSIS — I723 Aneurysm of iliac artery: Secondary | ICD-10-CM

## 2024-01-19 DIAGNOSIS — I252 Old myocardial infarction: Secondary | ICD-10-CM

## 2024-01-19 DIAGNOSIS — R911 Solitary pulmonary nodule: Secondary | ICD-10-CM

## 2024-01-19 DIAGNOSIS — Z8679 Personal history of other diseases of the circulatory system: Secondary | ICD-10-CM

## 2024-01-19 DIAGNOSIS — I509 Heart failure, unspecified: Secondary | ICD-10-CM | POA: Diagnosis not present

## 2024-01-19 DIAGNOSIS — I10 Essential (primary) hypertension: Secondary | ICD-10-CM

## 2024-01-19 NOTE — Progress Notes (Signed)
 Established Patient Office Visit  Subjective:      CC:  Chief Complaint  Patient presents with   Hospitalization Follow-up    HPI: Sydney [name] is a 89 y.o. female presenting on 01/19/2024 for Hospitalization Follow-up .  Discussed the use of AI scribe software for clinical note transcription with the patient, who gave verbal consent to proceed.  History of Present Illness Sydney [name] is an 89 year old female with new onset heart failure and atrial fibrillation who presents for follow-up after recent hospitalization.  She was recently hospitalized with RSV infection, acute pulmonary edema, and new onset heart failure with atrial fibrillation. Her symptoms included cough, congestion, and worsening shortness of breath. During her hospital stay, treatment with Lasix  improved her oxygen levels, allowing discharge on room air. Currently, she has no issues with shortness of breath.  An echocardiogram during hospitalization showed an ejection fraction of 20-25%. She was started on a low dose beta blocker, losartan, Entresto, and Eliquis. Her atrial fibrillation spontaneously converted back to sinus rhythm during her hospital stay, and she continues on Eliquis to prevent clot formation. She has not yet seen a cardiologist for follow-up.  A CT angiogram of the abdomen and pelvis revealed a focal pseudoaneurysm from the right common iliac artery. She has a history of an abdominal aortic aneurysm, which was previously downgraded. The pseudoaneurysm was found incidentally, and she has no pain associated with it. She has not seen a vascular doctor recently.  She has a history of hypertension. She was previously intolerant to statins but is now on Zetia for cholesterol management without any side effects.  A six millimeter right lung nodule was incidentally found, which is not concerning at this time. She has an appointment for follow-up in April next year.  She feels nervous and anxious  nearly every day and worries excessively about different things. No trouble relaxing, restlessness, or feeling afraid that something awful might happen.         Social history:  Relevant past medical, surgical, family and social history reviewed and updated as indicated. Interim medical history since our last visit reviewed.  Allergies and medications reviewed and updated.  DATA REVIEWED: CHART IN EPIC     ROS: Negative unless specifically indicated above in HPI.    Current Outpatient Medications:    acetaminophen  (TYLENOL ) 325 MG tablet, Take 325 mg by mouth daily as needed for mild pain (pain score 1-3) or moderate pain (pain score 4-6)., Disp: , Rfl:    apixaban (ELIQUIS) 5 MG TABS tablet, Take 1 tablet (5 mg total) by mouth 2 (two) times daily., Disp: 180 tablet, Rfl: 2   carvedilol (COREG) 6.25 MG tablet, Take 1 tablet (6.25 mg total) by mouth 2 (two) times daily with a meal., Disp: 180 tablet, Rfl: 2   ezetimibe (ZETIA) 10 MG tablet, Take 1 tablet (10 mg total) by mouth daily., Disp: 90 tablet, Rfl: 2   losartan (COZAAR) 25 MG tablet, Take 1 tablet (25 mg total) by mouth daily., Disp: 90 tablet, Rfl: 2   OVER THE COUNTER MEDICATION, Vita fusion Calcium  plus vit D3, Disp: , Rfl:         Objective:        BP 130/74   Pulse 60   Temp 98.6 F (37 C) (Temporal)   Ht 5' 6 (1.676 m)   Wt 155 lb (70.3 kg)   SpO2 98%   BMI 25.02 kg/m   Physical Exam VITALS: BP- 130/74, SaO2-  98% EXTREMITIES: No edema in extremities  Wt Readings from Last 3 Encounters:  01/19/24 155 lb (70.3 kg)  01/15/24 171 lb 8.3 oz (77.8 kg)  08/21/23 167 lb 15.9 oz (76.2 kg)    Physical Exam Constitutional:      General: She is not in acute distress.    Appearance: Normal appearance. She is normal weight. She is not ill-appearing, toxic-appearing or diaphoretic.  HENT:     Head: Normocephalic.  Cardiovascular:     Rate and Rhythm: Normal rate and regular rhythm.  Pulmonary:      Effort: Pulmonary effort is normal.     Breath sounds: Normal breath sounds.  Musculoskeletal:        General: Normal range of motion.     Right lower leg: No edema.     Left lower leg: No edema.  Neurological:     General: No focal deficit present.     Mental Status: She is alert and oriented to person, place, and time. Mental status is at baseline.  Psychiatric:        Mood and Affect: Mood normal.        Behavior: Behavior normal.        Thought Content: Thought content normal.        Judgment: Judgment normal.          Results LABS RSV: Positive (01/13/2024) COVID: Negative (01/13/2024) Blood Culture: Negative (01/13/2024) CBC: No anemia  RADIOLOGY CT Angio Abdomen and Pelvis: No dissection or aneurysm. Focal pseudoaneurysm from right common iliac artery. (01/13/2024) Right Lung Nodule: 6 mm nodule (01/13/2024) Right Common Iliac Artery Aneurysm: 2.32 cm, slight increase from 1.91 cm (01/13/2024) Left Common Iliac Artery Stenosis: Moderate diffuse stenosis due to calcified and noncalcified plaque  DIAGNOSTIC Echocardiogram: Ejection fraction 20-25%. (01/13/2024)  Assessment & Plan:   Assessment and Plan Assessment & Plan New onset heart failure with reduced ejection fraction Ejection fraction 20-25%. Improved volume status post-hospitalization with Lasix . Currently on low dose beta blocker and losartan. Entresto initiated in outpatient setting. Cardioprotective medications prescribed. Alcohol consumption may trigger AFib recurrence. - Refer to cardiologist in [place] for further management - Monitor weight daily; report weight gain >2 lbs in a day or >3 lbs in a week - Monitor for increased shortness of breath or swelling - Advise caution with alcohol consumption; discuss with cardiologist  Recent non-ST elevation myocardial infarction (NSTEMI) Recent NSTEMI likely triggered by RSV infection. Currently on Eliquis for anticoagulation and Zetia for cholesterol  management. No current requirement for diuretics post-hospitalization. - Continue Eliquis and Zetia - Repeat cholesterol panel in one month  Atrial fibrillation, resolved Atrial fibrillation spontaneously converted back to sinus rhythm. Alcohol is a potential trigger for AFib recurrence. - Advise caution with alcohol consumption; discuss with cardiologist  Right common iliac artery pseudoaneurysm, enlarging Previously stable, now slightly enlarged to 2.32 cm from 1.91 cm. Asymptomatic. On Eliquis for anticoagulation. - Refer to vascular specialist for monitoring and management  Moderate diffuse stenosis of left common iliac artery Moderate diffuse stenosis with calcified and non-calcified plaque. - Refer to vascular specialist for evaluation and management  Hypertension Blood pressure at 130/74 mmHg. Managed with losartan. - Continue losartan  Hyperlipidemia Managed with Zetia. Previous intolerance to atorvastatin  due to dizziness. - Continue Zetia - Repeat cholesterol panel in one month  Incidental right lung nodule, 6 mm Incidental 6 mm right lung nodule found on imaging. No immediate concern as nodules over 8 mm are typically followed up. - Monitor nodule; follow  up with Doctor Deane if needed        Return in about 1 month (around 02/19/2024) for f/u Dr. Bennett for recent NSTEMI and hyperlipidemia .     Ginger Patrick, MSN, APRN, FNP-C [facility]

## 2024-01-19 NOTE — Telephone Encounter (Signed)
 I have not tried reaching the pt. I looked through her chart and I don't see where anyone from our office has called her.

## 2024-01-19 NOTE — Telephone Encounter (Signed)
 Copied from CRM [id number]. Topic: General - Call Back - No Documentation >> Jan 18, 2024  5:08 PM Sydney [name] wrote: Reason for CRM: Pt returning call. Doesn't know who called or what the call was regarding.

## 2024-02-11 ENCOUNTER — Encounter: Payer: Self-pay | Admitting: Cardiology

## 2024-02-11 ENCOUNTER — Ambulatory Visit: Attending: Cardiology | Admitting: Cardiology

## 2024-02-11 VITALS — BP 120/65 | HR 78 | Ht 66.0 in | Wt 169.4 lb

## 2024-02-11 DIAGNOSIS — I5020 Unspecified systolic (congestive) heart failure: Secondary | ICD-10-CM | POA: Diagnosis not present

## 2024-02-11 DIAGNOSIS — E7800 Pure hypercholesterolemia, unspecified: Secondary | ICD-10-CM | POA: Diagnosis not present

## 2024-02-11 DIAGNOSIS — I480 Paroxysmal atrial fibrillation: Secondary | ICD-10-CM

## 2024-02-11 DIAGNOSIS — I214 Non-ST elevation (NSTEMI) myocardial infarction: Secondary | ICD-10-CM

## 2024-02-11 MED ORDER — ENTRESTO 24-26 MG PO TABS: 1.0000 | ORAL_TABLET | Freq: Two times a day (BID) | ORAL | 3 refills | Status: AC

## 2024-02-11 MED ORDER — METOPROLOL TARTRATE 100 MG PO TABS: ORAL_TABLET | ORAL | 0 refills | Status: AC

## 2024-02-11 NOTE — Patient Instructions (Signed)
 Medication Instructions:  - STOP losartan - START entresto 24-26 mg twice daily - take one 100 mg metoprolol two hours prior to cardiac CT  *If you need a refill on your cardiac medications before your next appointment, please call your pharmacy*  Lab Work: Your provider would like for you to have following labs drawn today BMP.   If you have labs (blood work) drawn today and your tests are completely normal, you will receive your results only by: MyChart Message (if you have MyChart) OR A paper copy in the mail If you have any lab test that is abnormal or we need to change your treatment, we will call you to review the results.  Testing/Procedures:   Your cardiac CT will be scheduled at:  [facility] [address] [place], [place] [address] [phone]  Please arrive 15 mins early for check-in and test prep.  There is spacious parking and easy access to the radiology department from the [facility] entrance. Please enter here and check-in with the desk attendant.    Please follow these instructions carefully (unless otherwise directed):  An IV will be required for this test and Nitroglycerin will be given.  Hold all erectile dysfunction medications at least 3 days (72 hrs) prior to test. (Ie viagra, cialis, sildenafil, tadalafil, etc)     On the Night Before the Test: Be sure to Drink plenty of water. Do not consume any caffeinated/decaffeinated beverages or chocolate 12 hours prior to your test. Do not take any antihistamines 12 hours prior to your test.  On the Day of the Test: Drink plenty of water until 1 hour prior to the test. Do not eat any food 1 hour prior to test. You may take your regular medications prior to the test.  Take metoprolol (Lopressor) two hours prior to test. If you take Furosemide /Hydrochlorothiazide/Spironolactone/Chlorthalidone, please HOLD on the morning of the test. Patients who wear a continuous  glucose monitor MUST remove the device prior to scanning. FEMALES- please wear underwire-free bra if available, avoid dresses & tight clothing       After the Test: Drink plenty of water. After receiving IV contrast, you may experience a mild flushed feeling. This is normal. On occasion, you may experience a mild rash up to 24 hours after the test. This is not dangerous. If this occurs, you can take Benadryl 25 mg, Zyrtec, Claritin, or Allegra and increase your fluid intake. (Patients taking Tikosyn should avoid Benadryl, and may take Zyrtec, Claritin, or Allegra) If you experience trouble breathing, this can be serious. If it is severe call 911 IMMEDIATELY. If it is mild, please call our office.  We will call to schedule your test 2-4 weeks out understanding that some insurance companies will need an authorization prior to the service being performed.   For more information and frequently asked questions, please visit our website : [email or web address]  For non-scheduling related questions, please contact the cardiac imaging nurse navigator should you have any questions/concerns: Cardiac Imaging Nurse Navigators Direct Office Dial: [phone]   For scheduling needs, including cancellations and rescheduling, please call Brittany, [phone].    Follow-Up: At [facility], you and your health needs are our priority.  As part of our continuing mission to provide you with exceptional heart care, our providers are all part of one team.  This team includes your primary Cardiologist (physician) and Advanced Practice Providers or APPs (Physician Assistants and Nurse Practitioners) who all work together to provide  you with the care you need, when you need it.  Your next appointment:   6 week(s)  Provider:   Redell Cave, MD    We recommend signing up for the patient portal called MyChart.  Sign up information is provided on this After Visit Summary.  MyChart is  used to connect with patients for Virtual Visits (Telemedicine).  Patients are able to view lab/test results, encounter notes, upcoming appointments, etc.  Non-urgent messages can be sent to your provider as well.   To learn more about what you can do with MyChart, go to [email or web address].

## 2024-02-11 NOTE — Progress Notes (Signed)
 Cardiology Office Note:    Date:  02/11/2024   ID:  Sydney [name], DOB 1933-04-09, MRN [id number]  PCP:  Sydney [name], MD   [facility] Providers Cardiologist:  None     Referring MD: Sydney Antu, FNP   Chief Complaint  Patient presents with   New Patient (Initial Visit)    Pt doing good.    History of Present Illness:    Sydney [name] is a 89 y.o. female with a hx of paroxysmal atrial fibrillation, HFrEF EF 20 to 25%, hypertension, hyperlipidemia, PAD(left, left artery stenosis, right iliac artery pseudoaneurysm) presenting for hospital follow-up.  Admitted to the hospital 3 weeks ago with symptoms of shortness of breath, diagnosed with RSV infection and pulmonary edema in the setting of A-fib RVR.  Workup with echo revealed cardiomyopathy EF 20 to 25%.  Troponins were elevated, patient managed with heparin x 48 hours.  Eliquis started upon discharge.  Converted to sinus rhythm with mg daily.  Carvedilol, losartan were placed prior to discharge.  States feeling well since discharge, denies shortness of breath since discharge.  However had a heart attack in his 79s, 2 brothers had MIs, 1 in his 65s, another in his 58s.  Denies any smoking history.  Prior notes/testing Echo 10/25 EF 20 to 25%  Past Medical History:  Diagnosis Date   GERD (gastroesophageal reflux disease)    Hx of lumbosacral spine surgery t   Hyperlipidemia    Hypertension    Iliac artery aneurysm    Lumbar spinal stenosis    Polymyalgia rheumatica 06/2016    Past Surgical History:  Procedure Laterality Date   JOINT REPLACEMENT  1992   right hip replacement   JOINT REPLACEMENT  2002   left hip replacement   ORIF ORBITAL FRACTURE Right 08/24/2020   Procedure: open reduction internal fixation of right orbital floor fracture;  Surgeon: Sydney Craig RAMAN, MD;  Location: [facility] OR;  Service: Plastics;  Laterality: Right;   ROTATOR CUFF REPAIR  2002   Left shoulder   SPINE SURGERY  2006 & 2010    posterior spinal fusion L4-5 L5S1    Current Medications: Current Meds  Medication Sig   acetaminophen  (TYLENOL ) 325 MG tablet Take 325 mg by mouth daily as needed for mild pain (pain score 1-3) or moderate pain (pain score 4-6).   apixaban (ELIQUIS) 5 MG TABS tablet Take 1 tablet (5 mg total) by mouth 2 (two) times daily.   carvedilol (COREG) 6.25 MG tablet Take 1 tablet (6.25 mg total) by mouth 2 (two) times daily with a meal.   ENTRESTO 24-26 MG Take 1 tablet by mouth 2 (two) times daily.   ezetimibe (ZETIA) 10 MG tablet Take 1 tablet (10 mg total) by mouth daily.   metoprolol tartrate (LOPRESSOR) 100 MG tablet TAKE 1 TABLET 2 HR PRIOR TO CARDIAC PROCEDURE   OVER THE COUNTER MEDICATION Vita fusion Calcium  plus vit D3   [DISCONTINUED] losartan (COZAAR) 25 MG tablet Take 1 tablet (25 mg total) by mouth daily.     Allergies:   Lipitor [atorvastatin ] and Latex   Social History   Socioeconomic History   Marital status: Widowed    Spouse name: Not on file   Number of children: Not on file   Years of education: Not on file   Highest education level: Not on file  Occupational History   Not on file  Tobacco Use   Smoking status: Never    Passive exposure: Past  Smokeless tobacco: Never  Substance and Sexual Activity   Alcohol use: No    Alcohol/week: 0.0 standard drinks of alcohol   Drug use: No   Sexual activity: Not on file  Other Topics Concern   Not on file  Social History Narrative   Has living will   Son Sydney [name] is health care POA   Discussed DNR--she requests (done 11/30/12)   No tube feedings if cognitively unaware   Social Drivers of Health   Financial Resource Strain: Not on file  Food Insecurity: No Food Insecurity (01/14/2024)   Hunger Vital Sign    Worried About Running Out of Food in the Last Year: Never true    Ran Out of Food in the Last Year: Never true  Transportation Needs: No Transportation Needs (01/14/2024)   PRAPARE - Scientist, Research (physical Sciences) (Medical): No    Lack of Transportation (Non-Medical): No  Physical Activity: Not on file  Stress: Not on file  Social Connections: Unknown (01/14/2024)   Social Connection and Isolation Panel    Frequency of Communication with Friends and Family: More than three times a week    Frequency of Social Gatherings with Friends and Family: More than three times a week    Attends Religious Services: More than 4 times per year    Active Member of [employer] or Organizations: No    Attends Engineer, Structural: Patient declined    Marital Status: Patient declined     Family History: The patient's family history includes Cancer in her brother; Diabetes in her sister; Heart attack in her father; Heart disease in her brother, brother, father, mother, and sister; Hyperlipidemia in her brother and father.  ROS:   Please see the history of present illness.     All other systems reviewed and are negative.  EKGs/Labs/Other Studies Reviewed:    The following studies were reviewed today:  EKG Interpretation Date/Time:  Thursday February 11 2024 10:26:09 EDT Ventricular Rate:  78 PR Interval:  164 QRS Duration:  82 QT Interval:  394 QTC Calculation: 449 R Axis:   -13  Text Interpretation: Normal sinus rhythm with sinus arrhythmia Inferior infarct , age undetermined Confirmed by Darliss Rogue ([id number]) on 02/11/2024 10:31:19 AM    Recent Labs: 01/13/2024: ALT 9; B Natriuretic Peptide 1,624.1; Magnesium 1.8; TSH 1.249 01/14/2024: BUN 12; Creatinine, Ser 1.13; Potassium 4.0; Sodium 140 01/15/2024: Hemoglobin 13.4; Platelets 177  Recent Lipid Panel    Component Value Date/Time   CHOL 204 (H) 01/14/2024 0257   TRIG 72 01/14/2024 0257   HDL 61 01/14/2024 0257   CHOLHDL 3.3 01/14/2024 0257   VLDL 14 01/14/2024 0257   LDLCALC 129 (H) 01/14/2024 0257   LDLDIRECT 129.9 11/30/2012 0926     Risk Assessment/Calculations:             Physical Exam:    VS:  BP 120/65 (BP  Location: Left Arm, Patient Position: Sitting, Cuff Size: Normal)   Pulse 78   Ht 5' 6 (1.676 m)   Wt 169 lb 6.4 oz (76.8 kg)   SpO2 95%   BMI 27.34 kg/m     Wt Readings from Last 3 Encounters:  02/11/24 169 lb 6.4 oz (76.8 kg)  01/19/24 155 lb (70.3 kg)  01/15/24 171 lb 8.3 oz (77.8 kg)     GEN:  Well nourished, well developed in no acute distress HEENT: Normal NECK: No JVD; No carotid bruits CARDIAC: RRR, no murmurs, rubs, gallops RESPIRATORY:  Clear to auscultation without rales, wheezing or rhonchi  ABDOMEN: Soft, non-tender, non-distended MUSCULOSKELETAL:  No edema; No deformity  SKIN: Warm and dry NEUROLOGIC:  Alert and oriented x 3 PSYCHIATRIC:  Normal affect   ASSESSMENT:    1. HFrEF (heart failure with reduced ejection fraction) (HCC)   2. Paroxysmal atrial fibrillation (HCC)   3. Pure hypercholesterolemia   4. Non-ST elevation (NSTEMI) myocardial infarction [facility])    PLAN:    In order of problems listed above:  HFrEF EF 20 to 25%.  Etiology for cardiomyopathy unknown.  Obtain coronary CT to evaluate CAD.  Denies edema, chest pain or shortness of breath.  NYHA class II symptoms.  Stop losartan, start Entresto 24/26 mg twice daily.  Continue Coreg 6.25 mg twice daily. Paroxysmal atrial fibrillation, currently in sinus rhythm.  Continue Eliquis 5 mg twice daily, Coreg 6.25 mg twice daily. Hyperlipidemia, not tolerant to statins.  Continue Zetia 10 mg daily. NSTEMI in the setting of A-fib RVR, cardiomyopathy.  Coronary CT as above.  Follow-up after 6 weeks.     Medication Adjustments/Labs and Tests Ordered: Current medicines are reviewed at length with the patient today.  Concerns regarding medicines are outlined above.  Orders Placed This Encounter  Procedures   CT CORONARY MORPH W/CTA COR W/SCORE W/CA W/CM &/OR WO/CM   Basic metabolic panel with GFR   EKG 87-[place]   Meds ordered this encounter  Medications   metoprolol tartrate (LOPRESSOR) 100 MG tablet     Sig: TAKE 1 TABLET 2 HR PRIOR TO CARDIAC PROCEDURE    Dispense:  1 tablet    Refill:  0   ENTRESTO 24-26 MG    Sig: Take 1 tablet by mouth 2 (two) times daily.    Dispense:  60 tablet    Refill:  3    Patient Instructions  Medication Instructions:  - STOP losartan - START entresto 24-26 mg twice daily - take one 100 mg metoprolol two hours prior to cardiac CT  *If you need a refill on your cardiac medications before your next appointment, please call your pharmacy*  Lab Work: Your provider would like for you to have following labs drawn today BMP.   If you have labs (blood work) drawn today and your tests are completely normal, you will receive your results only by: MyChart Message (if you have MyChart) OR A paper copy in the mail If you have any lab test that is abnormal or we need to change your treatment, we will call you to review the results.  Testing/Procedures:   Your cardiac CT will be scheduled at:  [facility] [address] [place], [place] [address] [phone]  Please arrive 15 mins early for check-in and test prep.  There is spacious parking and easy access to the radiology department from the [facility] and Vascular entrance. Please enter here and check-in with the desk attendant.    Please follow these instructions carefully (unless otherwise directed):  An IV will be required for this test and Nitroglycerin will be given.  Hold all erectile dysfunction medications at least 3 days (72 hrs) prior to test. (Ie viagra, cialis, sildenafil, tadalafil, etc)     On the Night Before the Test: Be sure to Drink plenty of water. Do not consume any caffeinated/decaffeinated beverages or chocolate 12 hours prior to your test. Do not take any antihistamines 12 hours prior to your test.  On the Day of the Test: Drink plenty of water until 1  hour prior to the test. Do not eat any food 1 hour prior to test. You may take your regular  medications prior to the test.  Take metoprolol (Lopressor) two hours prior to test. If you take Furosemide /Hydrochlorothiazide/Spironolactone/Chlorthalidone, please HOLD on the morning of the test. Patients who wear a continuous glucose monitor MUST remove the device prior to scanning. FEMALES- please wear underwire-free bra if available, avoid dresses & tight clothing       After the Test: Drink plenty of water. After receiving IV contrast, you may experience a mild flushed feeling. This is normal. On occasion, you may experience a mild rash up to 24 hours after the test. This is not dangerous. If this occurs, you can take Benadryl 25 mg, Zyrtec, Claritin, or Allegra and increase your fluid intake. (Patients taking Tikosyn should avoid Benadryl, and may take Zyrtec, Claritin, or Allegra) If you experience trouble breathing, this can be serious. If it is severe call 911 IMMEDIATELY. If it is mild, please call our office.  We will call to schedule your test 2-4 weeks out understanding that some insurance companies will need an authorization prior to the service being performed.   For more information and frequently asked questions, please visit our website : [email or web address]  For non-scheduling related questions, please contact the cardiac imaging nurse navigator should you have any questions/concerns: Cardiac Imaging Nurse Navigators Direct Office Dial: [phone]   For scheduling needs, including cancellations and rescheduling, please call Brittany, [phone].    Follow-Up: At [facility], you and your health needs are our priority.  As part of our continuing mission to provide you with exceptional heart care, our providers are all part of one team.  This team includes your primary Cardiologist (physician) and Advanced Practice Providers or APPs (Physician Assistants and Nurse Practitioners) who all work together to provide you with the care you need, when  you need it.  Your next appointment:   6 week(s)  Provider:   Redell Cave, MD    We recommend signing up for the patient portal called MyChart.  Sign up information is provided on this After Visit Summary.  MyChart is used to connect with patients for Virtual Visits (Telemedicine).  Patients are able to view lab/test results, encounter notes, upcoming appointments, etc.  Non-urgent messages can be sent to your provider as well.   To learn more about what you can do with MyChart, go to [email or web address].            Signed, Redell Cave, MD  02/11/2024 11:20 AM    [facility]

## 2024-02-12 LAB — BASIC METABOLIC PANEL WITH GFR
BUN/Creatinine Ratio: 17 (ref 12–28)
BUN: 17 mg/dL (ref 8–27)
CO2: 23 mmol/L (ref 20–29)
Calcium: 10.6 mg/dL — ABNORMAL HIGH (ref 8.7–10.3)
Chloride: 103 mmol/L (ref 96–106)
Creatinine, Ser: 1.02 mg/dL — ABNORMAL HIGH (ref 0.57–1.00)
Glucose: 95 mg/dL (ref 70–99)
Potassium: 5.5 mmol/L — ABNORMAL HIGH (ref 3.5–5.2)
Sodium: 143 mmol/L (ref 134–144)
eGFR: 53 mL/min/1.73 — ABNORMAL LOW (ref >59)

## 2024-02-19 ENCOUNTER — Ambulatory Visit (INDEPENDENT_AMBULATORY_CARE_PROVIDER_SITE_OTHER)

## 2024-02-19 VITALS — BP 110/74 | HR 76 | Ht 66.0 in | Wt 169.0 lb

## 2024-02-19 DIAGNOSIS — Z7185 Encounter for immunization safety counseling: Secondary | ICD-10-CM

## 2024-02-19 DIAGNOSIS — I714 Abdominal aortic aneurysm, without rupture, unspecified: Secondary | ICD-10-CM

## 2024-02-19 DIAGNOSIS — E875 Hyperkalemia: Secondary | ICD-10-CM

## 2024-02-19 DIAGNOSIS — N1831 Chronic kidney disease, stage 3a: Secondary | ICD-10-CM

## 2024-02-19 LAB — BASIC METABOLIC PANEL WITH GFR
BUN: 15 mg/dL (ref 6–23)
CO2: 26 meq/L (ref 19–32)
Calcium: 10.1 mg/dL (ref 8.4–10.5)
Chloride: 105 meq/L (ref 96–112)
Creatinine, Ser: 1.05 mg/dL (ref 0.40–1.20)
GFR: 47.1 mL/min — ABNORMAL LOW (ref >60.00)
Glucose, Bld: 101 mg/dL — ABNORMAL HIGH (ref 70–99)
Potassium: 4.5 meq/L (ref 3.5–5.1)
Sodium: 141 meq/L (ref 135–145)

## 2024-02-19 LAB — VITAMIN D 25 HYDROXY (VIT D DEFICIENCY, FRACTURES): VITD: 48.49 ng/mL (ref 30.00–100.00)

## 2024-02-19 NOTE — Patient Instructions (Signed)
 Thank you for visiting [facility] today! Here's what we talked about: - Get flu shot, and shingles vaccines - Think about pneumonia vaccine

## 2024-02-19 NOTE — Progress Notes (Signed)
 Subjective:   This visit was conducted in person. The patient gave informed consent to the use of Abridge AI technology to record the contents of the encounter as documented below.   Patient ID: Sydney [name], female    DOB: 06/11/1932, 89 y.o.   MRN: [id number]   Discussed the use of AI scribe software for clinical note transcription with the patient, who gave verbal consent to proceed.  History of Present Illness Sydney [name] is an 89 year old female with heart failure who presents for follow-up care. She is accompanied by her son, who lives one house away and assists with her daily needs.  She has a history of a mild heart attack on October 1st, 2025, and was recently diagnosed with heart failure. She is currently taking Entresto, one tablet twice a day, for heart failure management. No shortness of breath today.  She has a pseudoaneurysm in her right common iliac artery measuring 1.6 cm, identified in a recent CT scan in October 2025.  She has had reduced kidney function noted in recent labs. She avoids NSAIDs and uses Tylenol  for pain management. Her potassium levels were slightly high eight days ago, and losartan has been discontinued.  She has a history of osteoporosis, acid reflux, and high blood pressure, which is currently well-controlled. She takes Zetia 10 mg daily for cholesterol management and has previously not tolerated Lipitor.  She has a history of a lung nodule and no current symptoms of concern related to it. No leg swelling and she uses a cane for mobility. She does not drive or cook, and her son assists with meals and daily activities.      Review of Systems  All other systems reviewed and are negative.       Allergies  Allergen Reactions   Lipitor [Atorvastatin ]     Dizzy ( fuzziness)   Latex Rash    Current Outpatient Medications on File Prior to Visit  Medication Sig Dispense Refill   acetaminophen  (TYLENOL ) 325 MG tablet Take 325 mg by mouth daily  as needed for mild pain (pain score 1-3) or moderate pain (pain score 4-6).     apixaban (ELIQUIS) 5 MG TABS tablet Take 1 tablet (5 mg total) by mouth 2 (two) times daily. 180 tablet 2   carvedilol (COREG) 6.25 MG tablet Take 1 tablet (6.25 mg total) by mouth 2 (two) times daily with a meal. 180 tablet 2   ENTRESTO 24-26 MG Take 1 tablet by mouth 2 (two) times daily. 60 tablet 3   ezetimibe (ZETIA) 10 MG tablet Take 1 tablet (10 mg total) by mouth daily. 90 tablet 2   metoprolol tartrate (LOPRESSOR) 100 MG tablet TAKE 1 TABLET 2 HR PRIOR TO CARDIAC PROCEDURE 1 tablet 0   OVER THE COUNTER MEDICATION Vita fusion Calcium  plus vit D3     No current facility-administered medications on file prior to visit.    BP 110/74 (BP Location: Left Arm, Patient Position: Sitting, Cuff Size: Normal)   Pulse 76   Ht 5' 6 (1.676 m)   Wt 169 lb (76.7 kg)   BMI 27.28 kg/m   Objective:      Physical Exam GENERAL: Alert, cooperative, well developed, no acute distress. HEAD: Normocephalic atraumatic. EYES: Extraocular movements intact BL, pupils round, equal and reactive to light BL, conjunctivae normal BL. CARDIOVASCULAR: Normal heart rate and rhythm, S1 and S2 normal without murmurs. CHEST: Clear to auscultation bilaterally, no wheezes, rhonchi, or crackles. ABDOMEN: Soft, non  tender, non distended, without organomegaly, normal bowel sounds. EXTREMITIES: No cyanosis or edema. NEUROLOGICAL: Oriented to person, place and time, ambulating with cane, moves all extremities without gross motor or sensory deficit.         Assessment & Plan:   Assessment & Plan Chronic kidney disease, stage 3a with hyperkalemia and hypercalcemia Chronic kidney disease stage 3a with hyperkalemia and hypercalcemia. Discussed avoiding NSAIDs.  - Repeated BMP to monitor potassium and calcium  levels. - Ordered vitamin D  level and parathyroid hormone level. - Avoid NSAIDs; use Tylenol  for pain management.  Essential  hypertension Blood pressure well-controlled with current medication regimen, goal is less than 130/80 given CKD.  Normotensive at 110/74.  - Continue Coreg 6.25 mg twice daily  Right common iliac artery pseudoaneurysm Pseudoaneurysm measuring 1.6 cm on CT from October 2025. No immediate concern due to size. - Continue annual monitoring of pseudoaneurysm.  Heart failure with reduced ejection fraction and history of myocardial infarction Heart failure with reduced ejection fraction, ejection fraction at 25%.  Diagnosed at hospital admission a few weeks ago, patient was also treated for NSTEMI at that time. Currently on Entresto. Discussed signs of heart failure exacerbation to look out for.  Clinical exam is benign today, no signs of acute exacerbation.  - Continue Entresto 24/26 mg twice daily (losartan was DC'd) - Continue Coreg 6.25 mg twice daily - Educated on signs of heart failure exacerbation and when to call the doctor.  General Health Maintenance Discussed vaccinations and screenings. Recommended RSV vaccine due to age and chronic heart issues, as well as recent RSV infection on her last admission.  - Recommended RSV vaccine at pharmacy. - Consider pneumonia vaccine at upcoming physical. - Recommended shingles vaccine at pharmacy. - Plan for flu vaccination at her pharmacy later today.   Return in about 4 months (around 06/30/2024) for CPE, medicare wellness 1wk after.   Melainie Krinsky K Desean Heemstra, MD  02/19/24     Contains text generated by Abridge.

## 2024-02-20 ENCOUNTER — Ambulatory Visit: Payer: Self-pay

## 2024-02-20 LAB — PTH, INTACT AND CALCIUM
Calcium: 10.3 mg/dL (ref 8.6–10.4)
PTH: 43 pg/mL (ref 16–77)

## 2024-02-26 ENCOUNTER — Telehealth (HOSPITAL_COMMUNITY): Payer: Self-pay | Admitting: *Deleted

## 2024-02-26 NOTE — Telephone Encounter (Signed)

## 2024-02-29 ENCOUNTER — Ambulatory Visit
Admission: RE | Admit: 2024-02-29 | Discharge: 2024-02-29 | Disposition: A | Discharge status: Home / Self Care | Source: Ambulatory Visit

## 2024-02-29 ENCOUNTER — Other Ambulatory Visit: Payer: Self-pay

## 2024-02-29 ENCOUNTER — Ambulatory Visit
Admission: RE | Admit: 2024-02-29 | Discharge: 2024-02-29 | Disposition: A | Discharge status: Home / Self Care | Source: Ambulatory Visit | Attending: Cardiology | Admitting: Cardiology

## 2024-02-29 DIAGNOSIS — I2510 Atherosclerotic heart disease of native coronary artery without angina pectoris: Secondary | ICD-10-CM | POA: Diagnosis not present

## 2024-02-29 DIAGNOSIS — R931 Abnormal findings on diagnostic imaging of heart and coronary circulation: Secondary | ICD-10-CM | POA: Insufficient documentation

## 2024-02-29 DIAGNOSIS — I214 Non-ST elevation (NSTEMI) myocardial infarction: Secondary | ICD-10-CM | POA: Insufficient documentation

## 2024-02-29 MED ORDER — IOHEXOL 350 MG/ML SOLN
100.0000 mL | Freq: Once | INTRAVENOUS | Status: AC | PRN
  Administered 2024-02-29: 100 mL via INTRAVENOUS

## 2024-02-29 MED ORDER — DILTIAZEM HCL 25 MG/5ML IV SOLN: 10.0000 mg | INTRAVENOUS | Status: DC | PRN

## 2024-02-29 MED ORDER — METOPROLOL TARTRATE 5 MG/5ML IV SOLN: 10.0000 mg | Freq: Once | INTRAVENOUS | Status: DC | PRN

## 2024-02-29 MED ORDER — NITROGLYCERIN 0.4 MG SL SUBL
0.8000 mg | SUBLINGUAL_TABLET | Freq: Once | SUBLINGUAL | Status: AC
  Administered 2024-02-29: 0.8 mg via SUBLINGUAL

## 2024-02-29 NOTE — Progress Notes (Addendum)
 Patient tolerated CT well. Vital signs stable encourage to drink water throughout day.Reasons explained and verbalized understanding. Ambulated steady gait.

## 2024-03-02 ENCOUNTER — Ambulatory Visit: Payer: Self-pay | Admitting: Cardiology

## 2024-03-17 ENCOUNTER — Telehealth: Payer: Self-pay | Admitting: Cardiology

## 2024-03-17 NOTE — Telephone Encounter (Signed)
 Patient would like to discuss heart cath. He says she was advised that it has already been scheduled. Please advise.

## 2024-03-17 NOTE — Telephone Encounter (Signed)
 The patient has been called back (per DPR- MSG left) to discuss the possibility of heart cath to be discussed on 12/11 at appt with Dr LUCIUS  Reminded of upcoming appointment and encouraged to call if any concerns or questions arise

## 2024-03-24 ENCOUNTER — Ambulatory Visit: Attending: Cardiology | Admitting: Cardiology

## 2024-03-24 ENCOUNTER — Encounter: Payer: Self-pay | Admitting: Cardiology

## 2024-03-24 VITALS — BP 120/72 | HR 76 | Ht 66.5 in | Wt 171.8 lb

## 2024-03-24 DIAGNOSIS — I2510 Atherosclerotic heart disease of native coronary artery without angina pectoris: Secondary | ICD-10-CM | POA: Diagnosis not present

## 2024-03-24 DIAGNOSIS — E7800 Pure hypercholesterolemia, unspecified: Secondary | ICD-10-CM

## 2024-03-24 DIAGNOSIS — I480 Paroxysmal atrial fibrillation: Secondary | ICD-10-CM

## 2024-03-24 DIAGNOSIS — I5020 Unspecified systolic (congestive) heart failure: Secondary | ICD-10-CM

## 2024-03-24 MED ORDER — SPIRONOLACTONE 25 MG PO TABS: 12.5000 mg | ORAL_TABLET | Freq: Every day | ORAL | 3 refills | Status: AC

## 2024-03-24 NOTE — Progress Notes (Signed)
 Cardiology Office Note:    Date:  03/24/2024   ID:  Sydney [name], DOB December 11, 1932, MRN [id number]  PCP:  Bennett Reuben POUR, MD   [facility] Providers Cardiologist:  None     Referring MD: [name], MD   Chief Complaint  Patient presents with   Follow-up    6 week follow up /  pt has been doing well with no complaints of chest pain, chest pressure or SOB, medication reviewed verbally with patient .     History of Present Illness:    Sydney [name] is a 89 y.o. female with a hx of CAD (significant left main, LAD, LCx, RCA stenosis on CCTA 11/25 ), paroxysmal atrial fibrillation, HFrEF EF 20 to 25%, hypertension, hyperlipidemia, PAD(left, left artery stenosis, right iliac artery pseudoaneurysm) presenting for hospital follow-up.  Previously seen due to cardiomyopathy, coronary CT was obtained showing significant stenosis involving left main, LAD, LCx, RCA. Denies chest pain.  Compliant with medications as prescribed.  Denies any bleeding issues with Eliquis .  Overall doing okay.  Here to discuss left heart cath.   Prior notes/testing Echo 10/25 EF 20 to 25%  Past Medical History:  Diagnosis Date   GERD (gastroesophageal reflux disease)    Hx of lumbosacral spine surgery t   Hyperlipidemia    Hypertension    Iliac artery aneurysm    Lumbar spinal stenosis    Polymyalgia rheumatica 06/2016    Past Surgical History:  Procedure Laterality Date   JOINT REPLACEMENT  1992   right hip replacement   JOINT REPLACEMENT  2002   left hip replacement   ORIF ORBITAL FRACTURE Right 08/24/2020   Procedure: open reduction internal fixation of right orbital floor fracture;  Surgeon: Elisabeth Craig RAMAN, MD;  Location: [facility] OR;  Service: Plastics;  Laterality: Right;   ROTATOR CUFF REPAIR  2002   Left shoulder   SPINE SURGERY  2006 & 2010   posterior spinal fusion L4-5 L5S1    Current Medications: Current Meds  Medication Sig   acetaminophen  (TYLENOL ) 325 MG tablet Take 325  mg by mouth daily as needed for mild pain (pain score 1-3) or moderate pain (pain score 4-6).   apixaban  (ELIQUIS ) 5 MG TABS tablet Take 1 tablet (5 mg total) by mouth 2 (two) times daily.   carvedilol  (COREG ) 6.25 MG tablet Take 1 tablet (6.25 mg total) by mouth 2 (two) times daily with a meal.   ENTRESTO  24-26 MG Take 1 tablet by mouth 2 (two) times daily.   ezetimibe  (ZETIA ) 10 MG tablet Take 1 tablet (10 mg total) by mouth daily.   metoprolol  tartrate (LOPRESSOR ) 100 MG tablet TAKE 1 TABLET 2 HR PRIOR TO CARDIAC PROCEDURE   OVER THE COUNTER MEDICATION Vita fusion Calcium  plus vit D3   spironolactone (ALDACTONE) 25 MG tablet Take 0.5 tablets (12.5 mg total) by mouth daily.     Allergies:   Lipitor [atorvastatin ] and Latex   Social History   Socioeconomic History   Marital status: Widowed    Spouse name: Not on file   Number of children: Not on file   Years of education: Not on file   Highest education level: Not on file  Occupational History   Not on file  Tobacco Use   Smoking status: Never    Passive exposure: Past   Smokeless tobacco: Never  Substance and Sexual Activity   Alcohol use: No    Alcohol/week: 0.0 standard drinks of alcohol   Drug  use: No   Sexual activity: Not on file  Other Topics Concern   Not on file  Social History Narrative   Has living will   Son Sharolyn is health care POA   Discussed DNR--she requests (done 11/30/12)   No tube feedings if cognitively unaware   Social Drivers of Health   Tobacco Use: Low Risk (03/24/2024)   Patient History    Smoking Tobacco Use: Never    Smokeless Tobacco Use: Never    Passive Exposure: Past  Financial Resource Strain: Not on file  Food Insecurity: No Food Insecurity (01/14/2024)   Epic    Worried About Programme Researcher, Broadcasting/film/video in the Last Year: Never true    Ran Out of Food in the Last Year: Never true  Transportation Needs: No Transportation Needs (01/14/2024)   Epic    Lack of Transportation (Medical): No     Lack of Transportation (Non-Medical): No  Physical Activity: Not on file  Stress: Not on file  Social Connections: Unknown (01/14/2024)   Social Connection and Isolation Panel    Frequency of Communication with Friends and Family: More than three times a week    Frequency of Social Gatherings with Friends and Family: More than three times a week    Attends Religious Services: More than 4 times per year    Active Member of [employer] or Organizations: No    Attends Banker Meetings: Patient declined    Marital Status: Patient declined  Depression (PHQ2-9): Low Risk (01/19/2024)   Depression (PHQ2-9)    PHQ-2 Score: 3  Alcohol Screen: Not on file  Housing: Low Risk (01/14/2024)   Epic    Unable to Pay for Housing in the Last Year: No    Number of Times Moved in the Last Year: 0    Homeless in the Last Year: No  Utilities: Not At Risk (01/14/2024)   Epic    Threatened with loss of utilities: No  Health Literacy: Not on file     Family History: The patient's family history includes Cancer in her brother; Diabetes in her sister; Heart attack in her father; Heart disease in her brother, brother, father, mother, and sister; Hyperlipidemia in her brother and father.  ROS:   Please see the history of present illness.     All other systems reviewed and are negative.  EKGs/Labs/Other Studies Reviewed:    The following studies were reviewed today:       Recent Labs: 01/13/2024: ALT 9; B Natriuretic Peptide 1,624.1; Magnesium 1.8; TSH 1.249 01/15/2024: Hemoglobin 13.4; Platelets 177 02/19/2024: BUN 15; Creatinine, Ser 1.05; Potassium 4.5; Sodium 141  Recent Lipid Panel    Component Value Date/Time   CHOL 204 (H) 01/14/2024 0257   TRIG 72 01/14/2024 0257   HDL 61 01/14/2024 0257   CHOLHDL 3.3 01/14/2024 0257   VLDL 14 01/14/2024 0257   LDLCALC 129 (H) 01/14/2024 0257   LDLDIRECT 129.9 11/30/2012 0926     Risk Assessment/Calculations:             Physical Exam:    VS:   BP 120/72 (BP Location: Left Arm, Patient Position: Sitting, Cuff Size: Normal)   Pulse 76   Ht 5' 6.5 (1.689 m)   Wt 171 lb 12.8 oz (77.9 kg)   SpO2 97%   BMI 27.31 kg/m     Wt Readings from Last 3 Encounters:  03/24/24 171 lb 12.8 oz (77.9 kg)  02/19/24 169 lb (76.7 kg)  02/11/24 169 lb  6.4 oz (76.8 kg)     GEN:  Well nourished, well developed in no acute distress HEENT: Normal NECK: No JVD; No carotid bruits CARDIAC: RRR, no murmurs, rubs, gallops RESPIRATORY:  Clear to auscultation without rales, wheezing or rhonchi  ABDOMEN: Soft, non-tender, non-distended MUSCULOSKELETAL:  No edema; No deformity  SKIN: Warm and dry NEUROLOGIC:  Alert and oriented x 3 PSYCHIATRIC:  Normal affect   ASSESSMENT:    1. Coronary artery disease involving native coronary artery of native heart, unspecified whether angina present   2. HFrEF (heart failure with reduced ejection fraction) (HCC)   3. Paroxysmal atrial fibrillation (HCC)   4. Pure hypercholesterolemia     PLAN:    In order of problems listed above:  CAD (significant LM, LAD, LCx, RCA stenosis normal coronary CT 11/25).  Denies chest pain.  Continue Eliquis , Zetia .  Left heart cath discussed, would like to discuss with son prior to proceeding.  She would not want surgery which is understandable. HFrEF EF 20 to 25%.  Likely ischemic.  Start Aldactone 12.5 mg daily, continue Entresto  24/26 mg twice daily, Coreg  6.25 mg twice daily.. Paroxysmal atrial fibrillation, currently in sinus rhythm.  Continue Eliquis  5 mg twice daily, Coreg  6.25 mg twice daily. Hyperlipidemia, not tolerant to statins.  Continue Zetia  10 mg daily.  Follow-up after 6-8 weeks.     Medication Adjustments/Labs and Tests Ordered: Current medicines are reviewed at length with the patient today.  Concerns regarding medicines are outlined above.  No orders of the defined types were placed in this encounter.  Meds ordered this encounter  Medications    spironolactone (ALDACTONE) 25 MG tablet    Sig: Take 0.5 tablets (12.5 mg total) by mouth daily.    Dispense:  60 tablet    Refill:  3    Patient Instructions  Medication Instructions:  - START spironolactone 12.5 mg daily  *If you need a refill on your cardiac medications before your next appointment, please call your pharmacy*  Lab Work: No labs ordered today  If you have labs (blood work) drawn today and your tests are completely normal, you will receive your results only by: MyChart Message (if you have MyChart) OR A paper copy in the mail If you have any lab test that is abnormal or we need to change your treatment, we will call you to review the results.  Testing/Procedures: No test ordered today   Follow-Up: At [facility], you and your health needs are our priority.  As part of our continuing mission to provide you with exceptional heart care, our providers are all part of one team.  This team includes your primary Cardiologist (physician) and Advanced Practice Providers or APPs (Physician Assistants and Nurse Practitioners) who all work together to provide you with the care you need, when you need it.  Your next appointment:   6 week(s)  Provider:   You may see Dr Darliss or one of the following Advanced Practice Providers on your designated Care Team:   Lonni Meager, NP Lesley Maffucci, PA-C Bernardino Bring, PA-C Cadence [place], PA-C Tylene Lunch, NP Barnie Hila, NP    We recommend signing up for the patient portal called MyChart.  Sign up information is provided on this After Visit Summary.  MyChart is used to connect with patients for Virtual Visits (Telemedicine).  Patients are able to view lab/test results, encounter notes, upcoming appointments, etc.  Non-urgent messages can be sent to your provider as well.   To learn  more about what you can do with MyChart, go to [email or web address].              Signed, Redell Cave, MD   03/24/2024 1:25 PM    [facility]

## 2024-03-24 NOTE — Patient Instructions (Signed)
 Medication Instructions:  - START spironolactone 12.5 mg daily  *If you need a refill on your cardiac medications before your next appointment, please call your pharmacy*  Lab Work: No labs ordered today  If you have labs (blood work) drawn today and your tests are completely normal, you will receive your results only by: MyChart Message (if you have MyChart) OR A paper copy in the mail If you have any lab test that is abnormal or we need to change your treatment, we will call you to review the results.  Testing/Procedures: No test ordered today   Follow-Up: At [facility], you and your health needs are our priority.  As part of our continuing mission to provide you with exceptional heart care, our providers are all part of one team.  This team includes your primary Cardiologist (physician) and Advanced Practice Providers or APPs (Physician Assistants and Nurse Practitioners) who all work together to provide you with the care you need, when you need it.  Your next appointment:   6 week(s)  Provider:   You may see Dr Darliss or one of the following Advanced Practice Providers on your designated Care Team:   Lonni Meager, NP Lesley Maffucci, PA-C Bernardino Bring, PA-C Cadence [place], PA-C Tylene Lunch, NP Barnie Hila, NP    We recommend signing up for the patient portal called MyChart.  Sign up information is provided on this After Visit Summary.  MyChart is used to connect with patients for Virtual Visits (Telemedicine).  Patients are able to view lab/test results, encounter notes, upcoming appointments, etc.  Non-urgent messages can be sent to your provider as well.   To learn more about what you can do with MyChart, go to [email or web address].

## 2024-04-12 ENCOUNTER — Other Ambulatory Visit (HOSPITAL_COMMUNITY): Payer: Self-pay

## 2024-05-12 ENCOUNTER — Ambulatory Visit: Admitting: Cardiology

## 2024-06-02 ENCOUNTER — Ambulatory Visit: Admitting: Cardiology

## 2024-06-24 ENCOUNTER — Other Ambulatory Visit

## 2024-07-01 ENCOUNTER — Encounter
